# Patient Record
Sex: Male | Born: 1940
Health system: Southern US, Community
[De-identification: ages and names within clinical notes are randomized; demographics above are authoritative.]

## PROBLEM LIST (undated history)

## (undated) DIAGNOSIS — G629 Polyneuropathy, unspecified: Secondary | ICD-10-CM

## (undated) DIAGNOSIS — E119 Type 2 diabetes mellitus without complications: Secondary | ICD-10-CM

## (undated) DIAGNOSIS — C679 Malignant neoplasm of bladder, unspecified: Secondary | ICD-10-CM

## (undated) DIAGNOSIS — R2230 Localized swelling, mass and lump, unspecified upper limb: Secondary | ICD-10-CM

## (undated) DIAGNOSIS — N3090 Cystitis, unspecified without hematuria: Secondary | ICD-10-CM

## (undated) DIAGNOSIS — I499 Cardiac arrhythmia, unspecified: Secondary | ICD-10-CM

## (undated) DIAGNOSIS — C4490 Unspecified malignant neoplasm of skin, unspecified: Secondary | ICD-10-CM

## (undated) DIAGNOSIS — H9390 Unspecified disorder of ear, unspecified ear: Secondary | ICD-10-CM

## (undated) DIAGNOSIS — R369 Urethral discharge, unspecified: Secondary | ICD-10-CM

## (undated) DIAGNOSIS — F319 Bipolar disorder, unspecified: Secondary | ICD-10-CM

## (undated) DIAGNOSIS — F419 Anxiety disorder, unspecified: Secondary | ICD-10-CM

## (undated) DIAGNOSIS — R0789 Other chest pain: Secondary | ICD-10-CM

## (undated) DIAGNOSIS — N390 Urinary tract infection, site not specified: Secondary | ICD-10-CM

## (undated) DIAGNOSIS — N400 Enlarged prostate without lower urinary tract symptoms: Secondary | ICD-10-CM

## (undated) DIAGNOSIS — M79642 Pain in left hand: Secondary | ICD-10-CM

## (undated) DIAGNOSIS — E785 Hyperlipidemia, unspecified: Secondary | ICD-10-CM

## (undated) DIAGNOSIS — F32A Depression, unspecified: Secondary | ICD-10-CM

## (undated) DIAGNOSIS — K35891 Other acute appendicitis without perforation, with gangrene: Secondary | ICD-10-CM

## (undated) DIAGNOSIS — I10 Essential (primary) hypertension: Secondary | ICD-10-CM

## (undated) DIAGNOSIS — F329 Major depressive disorder, single episode, unspecified: Secondary | ICD-10-CM

## (undated) DIAGNOSIS — G4700 Insomnia, unspecified: Secondary | ICD-10-CM

## (undated) DIAGNOSIS — R300 Dysuria: Secondary | ICD-10-CM

## (undated) DIAGNOSIS — A6002 Herpesviral infection of other male genital organs: Secondary | ICD-10-CM

## (undated) DIAGNOSIS — Z87448 Personal history of other diseases of urinary system: Secondary | ICD-10-CM

## (undated) DIAGNOSIS — N200 Calculus of kidney: Secondary | ICD-10-CM

## (undated) HISTORY — PX: BLADDER SURGERY: SHX569

## (undated) HISTORY — PX: SKIN CANCER EXCISION: SHX779

## (undated) HISTORY — PX: CHOLECYSTECTOMY: SHX55

## (undated) HISTORY — DX: Urethral discharge, unspecified: R36.9

## (undated) HISTORY — PX: FRACTURE SURGERY: SHX138

## (undated) HISTORY — DX: Localized swelling, mass and lump, unspecified upper limb: R22.30

## (undated) HISTORY — PX: HERNIA REPAIR: SHX51

## (undated) HISTORY — DX: Dysuria: R30.0

## (undated) HISTORY — DX: Insomnia, unspecified: G47.00

## (undated) HISTORY — DX: Polyneuropathy, unspecified: G62.9

## (undated) HISTORY — DX: Bipolar disorder, unspecified: F31.9

## (undated) HISTORY — PX: COLOSTOMY: SHX63

## (undated) HISTORY — DX: Pain in left hand: M79.642

## (undated) HISTORY — DX: Unspecified disorder of ear, unspecified ear: H93.90

## (undated) HISTORY — DX: Essential (primary) hypertension: I10

## (undated) HISTORY — DX: Personal history of other diseases of urinary system: Z87.448

## (undated) HISTORY — DX: Other chest pain: R07.89

## (undated) HISTORY — DX: Type 2 diabetes mellitus without complications: E11.9

## (undated) HISTORY — DX: Benign prostatic hyperplasia without lower urinary tract symptoms: N40.0

## (undated) HISTORY — PX: INNER EAR SURGERY: SHX679

## (undated) HISTORY — DX: Hyperlipidemia, unspecified: E78.5

## (undated) HISTORY — PX: APPENDECTOMY: SHX54

---

## 2002-01-28 ENCOUNTER — Emergency Department (HOSPITAL_COMMUNITY): Admission: EM | Admit: 2002-01-28 | Discharge: 2002-01-28 | Payer: Self-pay | Admitting: Emergency Medicine

## 2002-02-23 ENCOUNTER — Emergency Department (HOSPITAL_COMMUNITY): Admission: EM | Admit: 2002-02-23 | Discharge: 2002-02-23 | Payer: Self-pay | Admitting: Emergency Medicine

## 2002-02-23 ENCOUNTER — Encounter: Payer: Self-pay | Admitting: Emergency Medicine

## 2002-10-21 ENCOUNTER — Emergency Department (HOSPITAL_COMMUNITY): Admission: EM | Admit: 2002-10-21 | Discharge: 2002-10-21 | Payer: Self-pay

## 2003-05-20 ENCOUNTER — Emergency Department (HOSPITAL_COMMUNITY): Admission: EM | Admit: 2003-05-20 | Discharge: 2003-05-20 | Payer: Self-pay | Admitting: Emergency Medicine

## 2003-08-11 ENCOUNTER — Emergency Department (HOSPITAL_COMMUNITY): Admission: EM | Admit: 2003-08-11 | Discharge: 2003-08-11 | Payer: Self-pay | Admitting: Emergency Medicine

## 2003-12-26 ENCOUNTER — Other Ambulatory Visit: Payer: Self-pay

## 2004-05-10 ENCOUNTER — Inpatient Hospital Stay: Payer: Self-pay | Admitting: Urology

## 2004-05-17 ENCOUNTER — Ambulatory Visit: Payer: Self-pay | Admitting: Oncology

## 2004-06-15 ENCOUNTER — Ambulatory Visit: Payer: Self-pay | Admitting: Urology

## 2004-07-26 ENCOUNTER — Emergency Department: Payer: Self-pay | Admitting: Emergency Medicine

## 2004-08-02 ENCOUNTER — Emergency Department: Payer: Self-pay | Admitting: Emergency Medicine

## 2004-08-17 ENCOUNTER — Emergency Department: Payer: Self-pay | Admitting: General Practice

## 2004-11-03 ENCOUNTER — Emergency Department: Payer: Self-pay | Admitting: Emergency Medicine

## 2004-11-18 ENCOUNTER — Emergency Department: Payer: Self-pay | Admitting: Emergency Medicine

## 2004-12-14 ENCOUNTER — Ambulatory Visit: Payer: Self-pay | Admitting: Oncology

## 2005-03-30 ENCOUNTER — Ambulatory Visit: Payer: Self-pay | Admitting: Urology

## 2005-05-04 ENCOUNTER — Emergency Department: Payer: Self-pay | Admitting: Unknown Physician Specialty

## 2005-05-30 ENCOUNTER — Ambulatory Visit: Payer: Self-pay | Admitting: Nurse Practitioner

## 2005-06-05 ENCOUNTER — Ambulatory Visit: Payer: Self-pay | Admitting: Oncology

## 2005-06-14 ENCOUNTER — Ambulatory Visit: Payer: Self-pay | Admitting: Oncology

## 2005-06-26 ENCOUNTER — Ambulatory Visit: Payer: Self-pay | Admitting: Urology

## 2005-06-29 ENCOUNTER — Ambulatory Visit: Payer: Self-pay | Admitting: Urology

## 2005-07-14 ENCOUNTER — Ambulatory Visit: Payer: Self-pay | Admitting: Oncology

## 2005-07-22 ENCOUNTER — Emergency Department: Payer: Self-pay | Admitting: Emergency Medicine

## 2005-08-16 ENCOUNTER — Emergency Department: Payer: Self-pay | Admitting: Emergency Medicine

## 2005-10-21 ENCOUNTER — Emergency Department: Payer: Self-pay | Admitting: Emergency Medicine

## 2005-10-23 ENCOUNTER — Ambulatory Visit: Payer: Self-pay | Admitting: Urology

## 2005-10-25 ENCOUNTER — Ambulatory Visit: Payer: Self-pay | Admitting: Oncology

## 2005-12-19 ENCOUNTER — Inpatient Hospital Stay (HOSPITAL_COMMUNITY): Admission: RE | Admit: 2005-12-19 | Discharge: 2005-12-20 | Payer: Self-pay | Admitting: General Surgery

## 2006-02-11 ENCOUNTER — Emergency Department (HOSPITAL_COMMUNITY): Admission: EM | Admit: 2006-02-11 | Discharge: 2006-02-12 | Payer: Self-pay | Admitting: Emergency Medicine

## 2006-02-12 ENCOUNTER — Emergency Department: Payer: Self-pay | Admitting: Unknown Physician Specialty

## 2006-03-31 ENCOUNTER — Emergency Department: Payer: Self-pay | Admitting: Emergency Medicine

## 2006-05-24 ENCOUNTER — Ambulatory Visit: Payer: Self-pay | Admitting: Urology

## 2006-05-30 ENCOUNTER — Emergency Department: Payer: Self-pay

## 2006-06-12 ENCOUNTER — Other Ambulatory Visit: Payer: Self-pay

## 2006-06-12 ENCOUNTER — Ambulatory Visit: Payer: Self-pay | Admitting: Urology

## 2006-07-31 ENCOUNTER — Ambulatory Visit: Payer: Self-pay | Admitting: Urology

## 2006-08-11 ENCOUNTER — Emergency Department: Payer: Self-pay | Admitting: Emergency Medicine

## 2006-09-17 ENCOUNTER — Ambulatory Visit: Payer: Self-pay | Admitting: Urology

## 2007-01-09 ENCOUNTER — Ambulatory Visit: Payer: Self-pay | Admitting: Urology

## 2007-01-10 ENCOUNTER — Ambulatory Visit: Payer: Self-pay | Admitting: Urology

## 2007-03-21 ENCOUNTER — Emergency Department: Payer: Self-pay | Admitting: Emergency Medicine

## 2007-06-07 ENCOUNTER — Emergency Department: Payer: Self-pay | Admitting: Emergency Medicine

## 2007-08-28 ENCOUNTER — Emergency Department: Payer: Self-pay | Admitting: Emergency Medicine

## 2007-09-03 ENCOUNTER — Emergency Department: Payer: Self-pay | Admitting: Emergency Medicine

## 2007-09-11 ENCOUNTER — Ambulatory Visit: Payer: Self-pay | Admitting: Urology

## 2008-03-14 ENCOUNTER — Emergency Department: Payer: Self-pay | Admitting: Emergency Medicine

## 2008-07-29 ENCOUNTER — Ambulatory Visit: Payer: Self-pay | Admitting: Urology

## 2008-11-18 ENCOUNTER — Emergency Department: Payer: Self-pay | Admitting: Emergency Medicine

## 2009-06-12 ENCOUNTER — Emergency Department: Payer: Self-pay | Admitting: Emergency Medicine

## 2009-07-28 ENCOUNTER — Ambulatory Visit: Payer: Self-pay | Admitting: Urology

## 2010-07-27 ENCOUNTER — Ambulatory Visit: Payer: Self-pay | Admitting: Urology

## 2010-09-04 ENCOUNTER — Encounter: Payer: Self-pay | Admitting: Internal Medicine

## 2010-10-17 ENCOUNTER — Emergency Department: Payer: Self-pay | Admitting: Emergency Medicine

## 2011-11-30 ENCOUNTER — Emergency Department: Payer: Self-pay | Admitting: Emergency Medicine

## 2011-12-07 ENCOUNTER — Emergency Department: Payer: Self-pay | Admitting: Internal Medicine

## 2012-10-31 DIAGNOSIS — R300 Dysuria: Secondary | ICD-10-CM

## 2012-10-31 DIAGNOSIS — A6004 Herpesviral vulvovaginitis: Secondary | ICD-10-CM | POA: Insufficient documentation

## 2012-10-31 DIAGNOSIS — R369 Urethral discharge, unspecified: Secondary | ICD-10-CM

## 2012-10-31 HISTORY — DX: Urethral discharge, unspecified: R36.9

## 2012-10-31 HISTORY — DX: Dysuria: R30.0

## 2012-11-07 DIAGNOSIS — A6001 Herpesviral infection of penis: Secondary | ICD-10-CM | POA: Insufficient documentation

## 2012-11-07 DIAGNOSIS — R339 Retention of urine, unspecified: Secondary | ICD-10-CM | POA: Insufficient documentation

## 2012-11-07 DIAGNOSIS — K432 Incisional hernia without obstruction or gangrene: Secondary | ICD-10-CM | POA: Insufficient documentation

## 2012-11-28 ENCOUNTER — Ambulatory Visit: Payer: Self-pay | Admitting: Surgery

## 2012-11-28 LAB — BASIC METABOLIC PANEL
Anion Gap: 7 (ref 7–16)
BUN: 20 mg/dL — ABNORMAL HIGH (ref 7–18)
Calcium, Total: 8.5 mg/dL (ref 8.5–10.1)
Chloride: 111 mmol/L — ABNORMAL HIGH (ref 98–107)
Co2: 20 mmol/L — ABNORMAL LOW (ref 21–32)
Creatinine: 0.94 mg/dL (ref 0.60–1.30)
EGFR (African American): >60
EGFR (Non-African Amer.): >60
Glucose: 125 mg/dL — ABNORMAL HIGH (ref 65–99)
Osmolality: 280 (ref 275–301)
Potassium: 4.1 mmol/L (ref 3.5–5.1)
Sodium: 138 mmol/L (ref 136–145)

## 2012-11-28 LAB — CBC
HCT: 46.7 % (ref 40.0–52.0)
HGB: 15.7 g/dL (ref 13.0–18.0)
MCH: 29 pg (ref 26.0–34.0)
MCHC: 33.7 g/dL (ref 32.0–36.0)
MCV: 86 fL (ref 80–100)
Platelet: 245 10*3/uL (ref 150–440)
RBC: 5.42 10*6/uL (ref 4.40–5.90)
RDW: 13.8 % (ref 11.5–14.5)
WBC: 8.2 10*3/uL (ref 3.8–10.6)

## 2012-12-05 ENCOUNTER — Ambulatory Visit: Payer: Self-pay | Admitting: Surgery

## 2013-01-29 ENCOUNTER — Ambulatory Visit: Payer: Self-pay | Admitting: Surgery

## 2013-02-06 ENCOUNTER — Ambulatory Visit: Payer: Self-pay | Admitting: Surgery

## 2013-03-27 DIAGNOSIS — Z87448 Personal history of other diseases of urinary system: Secondary | ICD-10-CM | POA: Insufficient documentation

## 2013-03-27 HISTORY — DX: Personal history of other diseases of urinary system: Z87.448

## 2013-06-12 DIAGNOSIS — N411 Chronic prostatitis: Secondary | ICD-10-CM | POA: Insufficient documentation

## 2013-11-04 ENCOUNTER — Ambulatory Visit: Payer: Self-pay

## 2014-05-18 DIAGNOSIS — F411 Generalized anxiety disorder: Secondary | ICD-10-CM | POA: Insufficient documentation

## 2014-05-20 ENCOUNTER — Emergency Department: Payer: Self-pay | Admitting: Emergency Medicine

## 2014-08-21 DIAGNOSIS — E119 Type 2 diabetes mellitus without complications: Secondary | ICD-10-CM | POA: Diagnosis not present

## 2014-08-21 DIAGNOSIS — E785 Hyperlipidemia, unspecified: Secondary | ICD-10-CM | POA: Diagnosis not present

## 2014-08-21 DIAGNOSIS — F418 Other specified anxiety disorders: Secondary | ICD-10-CM | POA: Diagnosis not present

## 2014-08-21 DIAGNOSIS — I10 Essential (primary) hypertension: Secondary | ICD-10-CM | POA: Diagnosis not present

## 2014-08-21 DIAGNOSIS — Z7189 Other specified counseling: Secondary | ICD-10-CM | POA: Diagnosis not present

## 2014-08-25 DIAGNOSIS — E119 Type 2 diabetes mellitus without complications: Secondary | ICD-10-CM | POA: Diagnosis not present

## 2014-09-21 DIAGNOSIS — E1142 Type 2 diabetes mellitus with diabetic polyneuropathy: Secondary | ICD-10-CM | POA: Diagnosis not present

## 2014-09-21 DIAGNOSIS — M546 Pain in thoracic spine: Secondary | ICD-10-CM | POA: Diagnosis not present

## 2014-09-21 DIAGNOSIS — E785 Hyperlipidemia, unspecified: Secondary | ICD-10-CM | POA: Diagnosis not present

## 2014-09-21 DIAGNOSIS — E1169 Type 2 diabetes mellitus with other specified complication: Secondary | ICD-10-CM | POA: Diagnosis not present

## 2014-09-21 DIAGNOSIS — G629 Polyneuropathy, unspecified: Secondary | ICD-10-CM | POA: Diagnosis not present

## 2014-12-04 NOTE — Op Note (Signed)
PATIENT NAME:  Rick [name], Rick [name] MR#:  [id number] DATE OF BIRTH:  25-Oct-1940  DATE OF PROCEDURE:  12/05/2012  PREOPERATIVE DIAGNOSIS: Recurrent ventral hernia.   POSTOPERATIVE DIAGNOSIS: Recurrent ventral hernia.   PROCEDURE: Repair of recurrent ventral hernia.   SURGEON: Rochel Brome, M.D.   ANESTHESIA: General.   INDICATIONS: This 74 year old male has had a history of multiple abdominal operations. Has had previous hernia repair in the left mid-abdomen. Also has had a long mid-abdominal incision. Recently presented with pain and swelling in the left mid-abdomen. A ventral hernia was demonstrated on physical exam just adjacent to an old transverse scar. Surgery was recommended for definitive treatment.   DESCRIPTION OF PROCEDURE: The patient was placed on the operating table in the supine position under general endotracheal anesthesia. The abdomen was prepared with clippers and ChloraPrep, draped in a sterile manner.   A transversely-oriented incision was made approximately 8 cm in length and carried down through subcutaneous tissues. This incision was at the old scar, carried down through subcutaneous tissues, and I encountered a ventral hernia sac. The sac was dissected free from surrounding structures. I did identify mesh which was located medially, and the fascial defect. The fascial defect itself was approximately 4 cm in dimension. The peritoneum was dissected away from the fascial ring defect. A portion of the peritoneum was excised. The fascia was dissected circumferentially, and there was a finding of another defect just about 3 cm below the fascial edge inferiorly, and the peritoneum was separated from the fascia at that point, and that smaller hernia was closed with a 0 Surgilon figure-of-eight suture.   Next, an Atrium mesh was cut to create a circular shape of some 5 cm in diameter and placed into the properitoneal plane. It was sutured to the fascia inferior to the smaller defect and  also was sutured to the fascia circumferentially with mattress-type sutures, and also through-and-through sutures. Subsequently the fascial edges were closed over the mesh, incorporating mesh into the repair. Hemostasis appeared to be intact. It is noted that during the course of the procedure numerous small bleeding points were cauterized. The subcutaneous tissues were approximated with interrupted 3-0 chromic. The skin was closed with running 4-0 Monocryl subcuticular suture and Dermabond.   It was further noted at this point that the portion of the peritoneum was closed with running  3-0 chromic prior to the repair.   The patient tolerated the procedure satisfactorily and was then carried to the recovery room for postoperative care.     ____________________________ J. Rochel Brome, MD jws:dm D: 12/05/2012 10:56:09 ET T: 12/05/2012 11:09:47 ET JOB#: [id number]  cc: Loreli Dollar, MD, <Dictator> Loreli Dollar MD ELECTRONICALLY SIGNED 12/05/2012 17:26

## 2014-12-04 NOTE — Op Note (Signed)
PATIENT NAME:  Rick [name], Rick [name] MR#:  [id number] DATE OF BIRTH:  Sep 04, 1940  DATE OF PROCEDURE:  02/06/2013  PREOPERATIVE DIAGNOSIS: Ventral hernia, ventral hernia.   POSTOPERATIVE DIAGNOSIS: Recurrent ventral hernia, ventral hernia.   PROCEDURE: Repair of 2 ventral hernias.   SURGEON: Loreli Dollar, MD   ANESTHESIA: General.   INDICATIONS: This 74 year old male has had previous laparotomy at another facility which was a midline incision and was a dirty case. Also has had open cholecystectomy many years ago. Has had multiple hernias repaired in the past, including use of mesh. Recently has developed a bulge in the subxiphoid position and also in the hypogastric area of the abdomen just slightly to the right of the midline. Both were prominent bulges and appeared to be increasing in size and causing moderate discomfort, and repair recommended for definitive treatment.   DESCRIPTION OF PROCEDURE: The patient was placed on the operating table in the supine position under general endotracheal anesthesia. The abdomen was clipped and prepared with ChloraPrep and draped in a sterile manner.   The first hernia was repaired in the subxiphoid position, which was at the level of the medial extent of his right subcostal scar. A transverse incision was made some 4 cm in length and carried down through subcutaneous tissues to encounter a ventral hernia sac which was dissected free from surrounding structures. It did have a smooth plane of dissection and was dissected down through the fascial defect, which the fascial defect was somewhat oriented transversely. The defect was approximately 2 cm in dimension. The hernia sac was dissected free from the fascial defect and inverted. Next, properitoneal fat was dissected away from the fascia above and below the defect. A finger was inserted and palpated circumferentially. There was no other defect in the immediate area. Repair was carried out using properitoneal Atrium  mesh, which was cut to create an oval shape of approximately 1.5 x 2.5 cm, which was placed oriented transversely in the properitoneal plane. It was sutured to the deep fascia superiorly and inferiorly with through-and-through 0 Surgilon. Next, the repair was carried out with a transversely oriented suture line of interrupted 0 Surgilon figure-of-eight sutures, incorporating mesh into each suture. It was noted during the course of the procedure a number of small bleeding points were cauterized, and at the end of the procedure, hemostasis was intact. The Scarpa's fascia was closed with interrupted 4-0 chromic. The skin was closed with running 4-0 Monocryl subcuticular suture.   Next, attention was turned to the hypogastric hernia, which is somewhat larger. A transversely oriented 6 cm incision was made, which was mostly to the right of the midline, but it did extend approximately a centimeter to the left of the midline. This was carried down through subcutaneous tissues and encountered what appeared to be scar tissue. There was an old piece of Prolene suture which was removed and dissected down to encounter a fascial defect. There was old mesh found in the superior aspect of the defect, and it appeared that this was a recurrent hernia, which hernia was just immediately beneath the previously placed mesh. It was further noted there was a longer incision in the left lower quadrant from a previous repair which extended up to the midline. Next, a somewhat tedious dissection was undertaken as there was a lot of scar tissue, and the hernia sac was dissected away from the fascia and away from the old piece of mesh circumferentially. The resulting defect was approximately 4 x 2 cm in  dimension. An Atrium mesh was selected and cut to create an oval shape of approximately 5 x 3 cm. This was placed into the properitoneal plane and was sutured with through-and-through 0 Surgilon sutures, placing one on each end and two across  the lower side and two across the upper side, which the upper side were attached to the old mesh. Next, remaining fascia was closed over the mesh with a transversely oriented suture line of interrupted 0 Surgilon figure-of-eight sutures, and this did bury the old piece of mesh beneath the fascia. It was noted during the course of the procedure, a number of small bleeding points were cauterized. Next, 0.5% Sensorcaine with epinephrine was infiltrated circumferentially around the repair and to the deep fascia, and also the subcutaneous tissues were infiltrated. The Scarpa's fascia was closed with interrupted 4-0 chromic, and the skin was closed with running 4-0 Monocryl subcuticular suture.   Both wounds were then cleaned and dried and treated with Dermabond. The Dermabond was allowed to dry, and subsequently, the patient was prepared for transfer to the recovery room.  ____________________________ Lenna Sciara. Rochel Brome, MD jws:OSi D: 02/06/2013 11:25:45 ET T: 02/06/2013 11:35:11 ET JOB#: [id number]  cc: Loreli Dollar, MD, <Dictator> Loreli Dollar MD ELECTRONICALLY SIGNED 02/07/2013 19:43

## 2014-12-21 DIAGNOSIS — I10 Essential (primary) hypertension: Secondary | ICD-10-CM | POA: Diagnosis not present

## 2014-12-21 DIAGNOSIS — E785 Hyperlipidemia, unspecified: Secondary | ICD-10-CM | POA: Diagnosis not present

## 2014-12-21 DIAGNOSIS — E1142 Type 2 diabetes mellitus with diabetic polyneuropathy: Secondary | ICD-10-CM | POA: Diagnosis not present

## 2014-12-21 DIAGNOSIS — E1169 Type 2 diabetes mellitus with other specified complication: Secondary | ICD-10-CM | POA: Diagnosis not present

## 2015-01-29 ENCOUNTER — Telehealth: Payer: Self-pay | Admitting: Family Medicine

## 2015-02-10 ENCOUNTER — Ambulatory Visit (INDEPENDENT_AMBULATORY_CARE_PROVIDER_SITE_OTHER): Payer: Medicare Other | Admitting: Family Medicine

## 2015-02-10 ENCOUNTER — Encounter: Payer: Self-pay | Admitting: Family Medicine

## 2015-02-10 VITALS — BP 122/70 | HR 84 | Temp 97.4°F | Ht 68.5 in | Wt 179.9 lb

## 2015-02-10 DIAGNOSIS — E118 Type 2 diabetes mellitus with unspecified complications: Secondary | ICD-10-CM

## 2015-02-10 DIAGNOSIS — Z8719 Personal history of other diseases of the digestive system: Secondary | ICD-10-CM | POA: Insufficient documentation

## 2015-02-10 DIAGNOSIS — F329 Major depressive disorder, single episode, unspecified: Secondary | ICD-10-CM | POA: Insufficient documentation

## 2015-02-10 DIAGNOSIS — F32A Depression, unspecified: Secondary | ICD-10-CM | POA: Insufficient documentation

## 2015-02-10 DIAGNOSIS — R2230 Localized swelling, mass and lump, unspecified upper limb: Secondary | ICD-10-CM | POA: Insufficient documentation

## 2015-02-10 DIAGNOSIS — F419 Anxiety disorder, unspecified: Secondary | ICD-10-CM

## 2015-02-10 DIAGNOSIS — R2232 Localized swelling, mass and lump, left upper limb: Secondary | ICD-10-CM | POA: Diagnosis not present

## 2015-02-10 HISTORY — DX: Localized swelling, mass and lump, unspecified upper limb: R22.30

## 2015-02-10 NOTE — Progress Notes (Signed)
Name: Rick [name]   MRN: [id number]    DOB: 31-Mar-1941   Date:02/10/2015       Progress Note  Subjective  Chief Complaint  Chief Complaint  Patient presents with  . Follow-up    diabetes    Diabetes He presents for his follow-up diabetic visit. He has type 2 diabetes mellitus. His disease course has been stable. Current diabetic treatment includes oral agent (dual therapy). An ACE inhibitor/angiotensin II receptor blocker is being taken.   Pt. Has cystic mass on his right arm, present for over 2 years and pt. thinks it is slowly enlarging but is not tender. Pt. is also concerned about a 'hernia' in his abdomen. He has history of appendicitis and had multiple abdominal surgeries, including some for abdominal wall hernias with insertion of mesh. He denies any abdominal pain but wants to be checked for any additional hernias.   Past Medical History  Diagnosis Date  . Diabetes mellitus without complication   . Hyperlipidemia   . Hypertension   . BPH (benign prostatic hypertrophy)   . Insomnia   . Bipolar disorder   . Peripheral neuropathy     Past Surgical History  Procedure Laterality Date  . Hernia repair    . Appendectomy    . Cholecystectomy      Family History  Problem Relation Age of Onset  . Transient ischemic attack Mother   . Diabetes Father   . Cancer Brother     History   Social History  . Marital Status: Divorced    Spouse Name: N/A  . Number of Children: N/A  . Years of Education: N/A   Occupational History  . Not on file.   Social History Main Topics  . Smoking status: Former Research scientist (life sciences)  . Smokeless tobacco: Not on file  . Alcohol Use: No  . Drug Use: No  . Sexual Activity: Not on file   Other Topics Concern  . Not on file   Social History Narrative  . No narrative on file     Current outpatient prescriptions:  .  carvedilol (COREG) 6.25 MG tablet, Take by mouth., Disp: , Rfl:  .  finasteride (PROSCAR) 5 MG tablet, Take 5 mg by mouth.,  Disp: , Rfl:  .  gabapentin (NEURONTIN) 300 MG capsule, TAKE ONE CAPSULE BY MOUTH AT BEDTIME THEN TWICE DAILY FOR NERVE PAIN, Disp: , Rfl:  .  glipiZIDE (GLUCOTROL) 10 MG tablet, Take 1 tablet by mouth 2 (two) times daily., Disp: , Rfl:  .  glucose blood (ONE TOUCH ULTRA TEST) test strip, CHECK BLOOD SUGAR TWICE DAILY AS DIRECTED., Disp: , Rfl:  .  hydrochlorothiazide (HYDRODIURIL) 25 MG tablet, One a day prn for elevated BP or swelling., Disp: , Rfl:  .  lamoTRIgine (LAMICTAL) 100 MG tablet, Take by mouth., Disp: , Rfl:  .  Lancets (ONETOUCH ULTRASOFT) lancets, CHECK BLOOD SUGAR TWICE DAILY AS DIRECTED., Disp: , Rfl:  .  lovastatin (MEVACOR) 20 MG tablet, Take 2 tablets by mouth daily., Disp: , Rfl:  .  metFORMIN (GLUCOPHAGE) 500 MG tablet, TAKE ONE (1) TABLET EACH DAY FOR DIABETES, Disp: , Rfl:  .  mirtazapine (REMERON) 15 MG tablet, TAKE ONE TABLET AT BEDTIME, Disp: , Rfl:  .  traZODone (DESYREL) 100 MG tablet, TAKE ONE TABLET AT BEDTIME, Disp: , Rfl:  .  diclofenac sodium (VOLTAREN) 1 % GEL, Apply topically., Disp: , Rfl:  .  enalapril (VASOTEC) 10 MG tablet, Take by mouth., Disp: , Rfl:  Allergies  Allergen Reactions  . Haloperidol     Other reaction(s): Unknown  . Sulfa Antibiotics     Other reaction(s): OTHER     Review of Systems  Gastrointestinal: Negative for nausea, vomiting, abdominal pain, constipation and blood in stool.  Skin: Negative for itching and rash.      Objective  Filed Vitals:   02/10/15 1030  BP: 122/70  Pulse: 84  Temp: 97.4 F (36.3 C)  TempSrc: Oral  Height: 5' 8.5" (1.74 m)  Weight: 179 lb 14.4 oz (81.602 kg)  SpO2: 98%    Physical Exam  Constitutional: He is well-developed, well-nourished, and in no distress.  Abdominal: Soft. Bowel sounds are normal. There is no tenderness. No hernia.    Multiple surgical scars. Abdominal protrusion, but non tender.  Skin:     Soft, mobile, nontender mass on left distal arm  Nursing note and  vitals reviewed.      No results found for this or any previous visit (from the past 2160 hour(s)).   Assessment & Plan 1. Mass of arm, left Referral to Gen. surgery for follow-up on left arm mass. - Ambulatory referral to General Surgery  2. History of abdominal hernia Pt. has had multiple abdominal surgeries including appendectomy, cholecystectomy, hernia repair, and laparotomy. Referral to Gen. surgery for evaluation of any possible hernias - Ambulatory referral to General Surgery  3. Diabetes mellitus type 2, controlled, with complications Last W0J is at goal. He is to continue on metformin and glipizide and repeat A1c in September 2016.   Anyeli Hockenbury [facility] 02/10/2015 12:12 PM

## 2015-02-17 ENCOUNTER — Telehealth: Payer: Self-pay | Admitting: Family Medicine

## 2015-02-17 NOTE — Telephone Encounter (Signed)
Please return patients call. He states he needs to see a Psychologist, sport and exercise for his dermatologist issue. Please call him back.

## 2015-02-18 NOTE — Telephone Encounter (Signed)
I just want to verify patient os checking on referral what is the latest update and is there anything i need to do to help?

## 2015-02-19 NOTE — Telephone Encounter (Signed)
Tried to contact patient on the phone number listed, but that has been disconnected. I can speak with the patient to discuss the dermatology referral when he contacts our office.

## 2015-02-19 NOTE — Telephone Encounter (Signed)
ERRENOUS °

## 2015-02-19 NOTE — Telephone Encounter (Signed)
Pt now wants a referral to see a dermatologist. I did inform patient that we were still working on his general surgery referral but that he may have to go to [place] and said that would be fine but he wanted to get the dermatology referral set up first.

## 2015-02-23 NOTE — Telephone Encounter (Signed)
Patient returned call for Dr. Manuella Ghazi concerning dermatology referral, he said he would like to be referred somewhere in [place] and that he can be reached at [phone], please return call to him concerning this referral.

## 2015-02-23 NOTE — Telephone Encounter (Signed)
Called patient at the number provided but no one answered. Please schedule patient for an appointment to discuss the dermatology referral.

## 2015-03-22 ENCOUNTER — Telehealth: Payer: Self-pay | Admitting: Family Medicine

## 2015-03-22 NOTE — Telephone Encounter (Signed)
Attempted to call patient on 03/22/15 @ 10:18am to inform patient that his insurance rep. Called our office on today and they were given our insurance credentialing department contact info.  There was no answer when I called Rick [name] and no voicemail box was available.

## 2015-03-23 ENCOUNTER — Telehealth: Payer: Self-pay | Admitting: Family Medicine

## 2015-03-23 ENCOUNTER — Ambulatory Visit: Payer: Self-pay | Admitting: Family Medicine

## 2015-03-23 MED ORDER — LOVASTATIN 20 MG PO TABS: 40.0000 mg | ORAL_TABLET | Freq: Every day | ORAL | Status: DC

## 2015-03-23 NOTE — Telephone Encounter (Signed)
Medication has been refilled and sent to [facility] 

## 2015-03-24 ENCOUNTER — Encounter: Payer: Self-pay | Admitting: Family Medicine

## 2015-03-24 ENCOUNTER — Ambulatory Visit (INDEPENDENT_AMBULATORY_CARE_PROVIDER_SITE_OTHER): Payer: Medicare Other | Admitting: Family Medicine

## 2015-03-24 ENCOUNTER — Other Ambulatory Visit: Payer: Self-pay

## 2015-03-24 VITALS — BP 134/78 | HR 88 | Resp 18 | Ht 69.0 in | Wt 184.6 lb

## 2015-03-24 DIAGNOSIS — F319 Bipolar disorder, unspecified: Secondary | ICD-10-CM | POA: Insufficient documentation

## 2015-03-24 DIAGNOSIS — H9391 Unspecified disorder of right ear: Secondary | ICD-10-CM

## 2015-03-24 DIAGNOSIS — H9390 Unspecified disorder of ear, unspecified ear: Secondary | ICD-10-CM

## 2015-03-24 DIAGNOSIS — G4700 Insomnia, unspecified: Secondary | ICD-10-CM | POA: Insufficient documentation

## 2015-03-24 DIAGNOSIS — E119 Type 2 diabetes mellitus without complications: Secondary | ICD-10-CM | POA: Insufficient documentation

## 2015-03-24 DIAGNOSIS — E1142 Type 2 diabetes mellitus with diabetic polyneuropathy: Secondary | ICD-10-CM | POA: Insufficient documentation

## 2015-03-24 HISTORY — DX: Unspecified disorder of ear, unspecified ear: H93.90

## 2015-03-24 NOTE — Progress Notes (Signed)
Name: Rick [name]   MRN: [id number]    DOB: [birth year]   Date:03/24/2015       Progress Note  Subjective  Chief Complaint  Chief Complaint  Patient presents with  . Ear Problem    HPI Pt. Is here to have his right ear checked. He thinks that there may be wax build up in his right ear. He has no discharge from the ear, no fevers, or chills. He had ear wax removed by previous PCP. He ' knocked a hole in the eardrum' 2-3 years ago and has decreased hearing as a result. He was seen by Dr. Richardson Landry at that time.  Past Medical History  Diagnosis Date  . Diabetes mellitus without complication   . Hyperlipidemia   . Hypertension   . BPH (benign prostatic hypertrophy)   . Insomnia   . Bipolar disorder   . Peripheral neuropathy     Past Surgical History  Procedure Laterality Date  . Hernia repair    . Appendectomy    . Cholecystectomy      Family History  Problem Relation Age of Onset  . Transient ischemic attack Mother   . Diabetes Father   . Cancer Brother     Social History   Social History  . Marital Status: Divorced    Spouse Name: N/A  . Number of Children: N/A  . Years of Education: N/A   Occupational History  . Not on file.   Social History Main Topics  . Smoking status: Former Smoker -- 10 years    Types: Cigarettes    Start date: 08/14/1994    Quit date: 03/23/2005  . Smokeless tobacco: Former Systems developer    Types: Snuff, Chew    Quit date: 03/23/2010  . Alcohol Use: No  . Drug Use: No  . Sexual Activity: No   Other Topics Concern  . Not on file   Social History Narrative     Current outpatient prescriptions:  .  carvedilol (COREG) 6.25 MG tablet, Take by mouth., Disp: , Rfl:  .  diclofenac sodium (VOLTAREN) 1 % GEL, Apply topically., Disp: , Rfl:  .  enalapril (VASOTEC) 10 MG tablet, Take by mouth., Disp: , Rfl:  .  finasteride (PROSCAR) 5 MG tablet, Take 5 mg by mouth., Disp: , Rfl:  .  gabapentin (NEURONTIN) 300 MG capsule, TAKE ONE CAPSULE BY  MOUTH AT BEDTIME THEN TWICE DAILY FOR NERVE PAIN, Disp: , Rfl:  .  glipiZIDE (GLUCOTROL) 10 MG tablet, Take 1 tablet by mouth 2 (two) times daily., Disp: , Rfl:  .  glucose blood (ONE TOUCH ULTRA TEST) test strip, CHECK BLOOD SUGAR TWICE DAILY AS DIRECTED., Disp: , Rfl:  .  hydrochlorothiazide (HYDRODIURIL) 25 MG tablet, One a day prn for elevated BP or swelling., Disp: , Rfl:  .  lamoTRIgine (LAMICTAL) 100 MG tablet, Take by mouth., Disp: , Rfl:  .  Lancets (ONETOUCH ULTRASOFT) lancets, CHECK BLOOD SUGAR TWICE DAILY AS DIRECTED., Disp: , Rfl:  .  lovastatin (MEVACOR) 20 MG tablet, Take 2 tablets (40 mg total) by mouth daily., Disp: 30 tablet, Rfl: 1 .  metFORMIN (GLUCOPHAGE) 500 MG tablet, TAKE ONE (1) TABLET EACH DAY FOR DIABETES, Disp: , Rfl:  .  mirtazapine (REMERON) 15 MG tablet, TAKE ONE TABLET AT BEDTIME, Disp: , Rfl:  .  traZODone (DESYREL) 100 MG tablet, TAKE ONE TABLET AT BEDTIME, Disp: , Rfl:   Allergies  Allergen Reactions  . Haloperidol     Other reaction(s): Unknown  .  Sulfa Antibiotics     Other reaction(s): OTHER     Review of Systems  Constitutional: Negative for fever and chills.  HENT: Negative for ear discharge, ear pain, hearing loss and sore throat.   Neurological: Negative for headaches.      Objective  Filed Vitals:   03/24/15 1126  BP: 134/78  Pulse: 88  Resp: 18  Height: 5\' 9"  (1.753 m)  Weight: 184 lb 9.6 oz (83.734 kg)  SpO2: 98%    Physical Exam  HENT:  Right Ear: Tympanic membrane, external ear and ear canal normal. No drainage, swelling or tenderness. No decreased hearing is noted.  Mouth/Throat: No posterior oropharyngeal edema.  TM normal, no effusion visualized. Canal is normal.small quantity of cerumen visualized  Neck: Neck supple.  Cardiovascular: Normal rate and regular rhythm.   Pulmonary/Chest: Effort normal and breath sounds normal.  Nursing note and vitals reviewed.    Assessment & Plan  1. Ear problem, right  Patient  has no apparent abnormality of the right ear upon visual  Inspection using an otoscope. I have offered to refer him to ENT for further evaluation but patient appears satisfied and reassured. He will contact me if he has any concerning symptoms.  Cathryne Mancebo [facility] 03/24/2015 6:59 PM

## 2015-04-21 ENCOUNTER — Ambulatory Visit: Payer: Self-pay | Admitting: Family Medicine

## 2015-04-26 ENCOUNTER — Ambulatory Visit: Payer: Medicare Other | Admitting: Family Medicine

## 2015-04-27 ENCOUNTER — Ambulatory Visit: Payer: Medicare Other | Admitting: Family Medicine

## 2015-05-07 ENCOUNTER — Telehealth: Payer: Self-pay | Admitting: Family Medicine

## 2015-05-07 MED ORDER — LOVASTATIN 20 MG PO TABS: 40.0000 mg | ORAL_TABLET | Freq: Every day | ORAL | Status: DC

## 2015-05-07 NOTE — Telephone Encounter (Signed)
Medication has been refilled and sent to [facility]

## 2015-05-11 ENCOUNTER — Other Ambulatory Visit: Payer: Self-pay | Admitting: Family Medicine

## 2015-05-11 ENCOUNTER — Telehealth: Payer: Self-pay | Admitting: Family Medicine

## 2015-05-11 ENCOUNTER — Ambulatory Visit (INDEPENDENT_AMBULATORY_CARE_PROVIDER_SITE_OTHER): Payer: Medicare Other | Admitting: Family Medicine

## 2015-05-11 ENCOUNTER — Encounter: Payer: Self-pay | Admitting: Family Medicine

## 2015-05-11 VITALS — BP 135/77 | HR 89 | Temp 98.1°F | Resp 18 | Ht 69.0 in | Wt 188.7 lb

## 2015-05-11 DIAGNOSIS — E785 Hyperlipidemia, unspecified: Secondary | ICD-10-CM

## 2015-05-11 DIAGNOSIS — Z23 Encounter for immunization: Secondary | ICD-10-CM | POA: Diagnosis not present

## 2015-05-11 DIAGNOSIS — E1142 Type 2 diabetes mellitus with diabetic polyneuropathy: Secondary | ICD-10-CM

## 2015-05-11 DIAGNOSIS — E1140 Type 2 diabetes mellitus with diabetic neuropathy, unspecified: Secondary | ICD-10-CM

## 2015-05-11 DIAGNOSIS — I10 Essential (primary) hypertension: Secondary | ICD-10-CM | POA: Diagnosis not present

## 2015-05-11 LAB — POCT GLYCOSYLATED HEMOGLOBIN (HGB A1C): Hemoglobin A1C: 5.7

## 2015-05-11 NOTE — Progress Notes (Signed)
Name: Rick [name]   MRN: [id number]    DOB: 26-Apr-1941   Date:05/11/2015       Progress Note  Subjective  Chief Complaint  Chief Complaint  Patient presents with  . Follow-up    1 mo  . Medication Refill  . Diabetes    Diabetes He presents for his follow-up diabetic visit. He has type 2 diabetes mellitus. His disease course has been stable. Pertinent negatives for hypoglycemia include no headaches. Pertinent negatives for diabetes include no blurred vision and no chest pain. Pertinent negatives for diabetic complications include no CVA. Current diabetic treatment includes oral agent (dual therapy). He is following a diabetic diet. He participates in exercise daily. His breakfast blood glucose range is generally 90-110 mg/dl. An ACE inhibitor/angiotensin II receptor blocker is being taken. Eye exam is not current.  Hyperlipidemia This is a chronic problem. The problem is controlled. Recent lipid tests were reviewed and are normal. Exacerbating diseases include diabetes. Pertinent negatives include no chest pain, leg pain, myalgias or shortness of breath. Current antihyperlipidemic treatment includes statins. Risk factors for coronary artery disease include dyslipidemia and diabetes mellitus.  Hypertension This is a chronic problem. The problem is controlled. Pertinent negatives include no blurred vision, chest pain, headaches, palpitations or shortness of breath. Past treatments include beta blockers, diuretics and ACE inhibitors. Compliance problems include psychosocial issues (pt. claims taking BP medications 'as needed').  There is no history of kidney disease, CAD/MI or CVA.   This patient is poorly compliant with his medical, dietary  And pharmacotherapy. He reports taking medications 'as needed' such as 'my blood sugar is high.'He seems to have good insight into his chronic diseases but does not take medications as prescribed.  Past Medical History  Diagnosis Date  . Diabetes  mellitus without complication   . Hyperlipidemia   . Hypertension   . BPH (benign prostatic hypertrophy)   . Insomnia   . Bipolar disorder   . Peripheral neuropathy     Past Surgical History  Procedure Laterality Date  . Hernia repair    . Appendectomy    . Cholecystectomy      Family History  Problem Relation Age of Onset  . Transient ischemic attack Mother   . Diabetes Father   . Cancer Brother     Social History   Social History  . Marital Status: Divorced    Spouse Name: N/A  . Number of Children: N/A  . Years of Education: N/A   Occupational History  . Not on file.   Social History Main Topics  . Smoking status: Former Smoker -- 10 years    Types: Cigarettes    Start date: 08/14/1994    Quit date: 03/23/2005  . Smokeless tobacco: Former Systems developer    Types: Snuff, Chew    Quit date: 03/23/2010  . Alcohol Use: No  . Drug Use: No  . Sexual Activity: No   Other Topics Concern  . Not on file   Social History Narrative    Current outpatient prescriptions:  .  carvedilol (COREG) 6.25 MG tablet, Take by mouth., Disp: , Rfl:  .  diclofenac sodium (VOLTAREN) 1 % GEL, Apply topically., Disp: , Rfl:  .  enalapril (VASOTEC) 10 MG tablet, Take by mouth., Disp: , Rfl:  .  finasteride (PROSCAR) 5 MG tablet, Take 5 mg by mouth., Disp: , Rfl:  .  gabapentin (NEURONTIN) 300 MG capsule, TAKE ONE CAPSULE BY MOUTH AT BEDTIME THEN TWICE DAILY FOR NERVE PAIN, Disp: ,  Rfl:  .  glipiZIDE (GLUCOTROL) 10 MG tablet, Take 1 tablet by mouth 2 (two) times daily., Disp: , Rfl:  .  glucose blood (ONE TOUCH ULTRA TEST) test strip, CHECK BLOOD SUGAR TWICE DAILY AS DIRECTED., Disp: , Rfl:  .  hydrochlorothiazide (HYDRODIURIL) 25 MG tablet, One a day prn for elevated BP or swelling., Disp: , Rfl:  .  lamoTRIgine (LAMICTAL) 100 MG tablet, Take by mouth., Disp: , Rfl:  .  Lancets (ONETOUCH ULTRASOFT) lancets, CHECK BLOOD SUGAR TWICE DAILY AS DIRECTED., Disp: , Rfl:  .  lovastatin (MEVACOR) 20  MG tablet, Take 2 tablets (40 mg total) by mouth daily., Disp: 60 tablet, Rfl: 0 .  metFORMIN (GLUCOPHAGE) 500 MG tablet, TAKE ONE (1) TABLET EACH DAY FOR DIABETES, Disp: , Rfl:  .  mirtazapine (REMERON) 15 MG tablet, TAKE ONE TABLET AT BEDTIME, Disp: , Rfl:  .  mirtazapine (REMERON) 15 MG tablet, TAKE ONE TABLET AT BEDTIME, Disp: , Rfl:  .  traZODone (DESYREL) 100 MG tablet, TAKE ONE TABLET AT BEDTIME, Disp: , Rfl:   Allergies  Allergen Reactions  . Haloperidol     Other reaction(s): Unknown  . Sulfa Antibiotics     Other reaction(s): OTHER    Review of Systems  Eyes: Negative for blurred vision.  Respiratory: Negative for shortness of breath.   Cardiovascular: Negative for chest pain and palpitations.  Musculoskeletal: Negative for myalgias.  Neurological: Negative for headaches.    Objective  Filed Vitals:   05/11/15 1049  BP: 135/77  Pulse: 89  Temp: 98.1 F (36.7 C)  TempSrc: Oral  Resp: 18  Height: 5\' 9"  (1.753 m)  Weight: 188 lb 11.2 oz (85.594 kg)  SpO2: 92%    Physical Exam  Constitutional: He is well-developed, well-nourished, and in no distress.  Cardiovascular: Normal rate and regular rhythm.   Pulmonary/Chest: Effort normal and breath sounds normal.  Abdominal: Soft. Bowel sounds are normal.  Nursing note and vitals reviewed.   Assessment & Plan  1. Need for immunization against influenza  - Flu vaccine HIGH DOSE PF  2. Well controlled type 2 diabetes mellitus with peripheral neuropathy A1c is 5.7%, consistent with well-controlled diabetes mellitus. DC glipizide and continue on metformin 500 mg daily. Patient is insisting that metformin should be increased to twice daily. I have explained that current dose of metformin is appropriate based on his A1c and that we will recheck in 3 months. If his BG levels rise, he will schedule an appointment to discuss changes in pharmacotherapy. - POCT HgB A1C  3. Essential hypertension Blood pressure stable and  controlled on current therapy. Again, I do not know if he is taking the medication as prescribed. He will follow-up in 3 months.  4. Hyperlipidemia  - Lipid Profile - Comprehensive Metabolic Panel (CMET)   Syed Asad [facility] 05/11/2015 5:55 PM

## 2015-05-11 NOTE — Telephone Encounter (Signed)
Rick [name] from [facility] states patient is there: stating that a prescription for metformin 500mg  in the morning and 500mg  in the evening was suppose to have been called in. Please send thank you. Please return [facility] [phone]

## 2015-05-12 LAB — COMPREHENSIVE METABOLIC PANEL
ALT: 23 IU/L (ref 0–44)
AST: 21 IU/L (ref 0–40)
Albumin/Globulin Ratio: 2.2 (ref 1.1–2.5)
Albumin: 5 g/dL — ABNORMAL HIGH (ref 3.5–4.8)
Alkaline Phosphatase: 43 IU/L (ref 39–117)
BUN/Creatinine Ratio: 9 — ABNORMAL LOW (ref 10–22)
BUN: 9 mg/dL (ref 8–27)
Bilirubin Total: 0.5 mg/dL (ref 0.0–1.2)
CALCIUM: 9.8 mg/dL (ref 8.6–10.2)
CO2: 24 mmol/L (ref 18–29)
CREATININE: 1.04 mg/dL (ref 0.76–1.27)
Chloride: 106 mmol/L (ref 97–108)
GFR calc Af Amer: 82 mL/min/{1.73_m2} (ref >59)
GFR, EST NON AFRICAN AMERICAN: 71 mL/min/{1.73_m2} (ref >59)
GLOBULIN, TOTAL: 2.3 g/dL (ref 1.5–4.5)
Glucose: 70 mg/dL (ref 65–99)
Potassium: 5.1 mmol/L (ref 3.5–5.2)
Sodium: 145 mmol/L — ABNORMAL HIGH (ref 134–144)
TOTAL PROTEIN: 7.3 g/dL (ref 6.0–8.5)

## 2015-05-12 LAB — LIPID PANEL
CHOLESTEROL TOTAL: 160 mg/dL (ref 100–199)
Chol/HDL Ratio: 3.1 ratio units (ref 0.0–5.0)
HDL: 51 mg/dL (ref >39)
LDL Calculated: 79 mg/dL (ref 0–99)
Triglycerides: 148 mg/dL (ref 0–149)
VLDL Cholesterol Cal: 30 mg/dL (ref 5–40)

## 2015-05-12 NOTE — Telephone Encounter (Signed)
Called Anderson Malta from [facility] and confirmed prescription is for Metformin 500 mg once daily and we will recheck A1C in 3 months per Dr. Manuella Ghazi

## 2015-05-18 ENCOUNTER — Encounter: Payer: Self-pay | Admitting: Family Medicine

## 2015-05-18 ENCOUNTER — Ambulatory Visit (INDEPENDENT_AMBULATORY_CARE_PROVIDER_SITE_OTHER): Payer: Medicare Other | Admitting: Family Medicine

## 2015-05-18 VITALS — BP 138/74 | HR 130 | Temp 99.0°F | Resp 20 | Ht 69.0 in | Wt 185.0 lb

## 2015-05-18 DIAGNOSIS — M79642 Pain in left hand: Secondary | ICD-10-CM

## 2015-05-18 DIAGNOSIS — E1142 Type 2 diabetes mellitus with diabetic polyneuropathy: Secondary | ICD-10-CM | POA: Diagnosis not present

## 2015-05-18 DIAGNOSIS — M25532 Pain in left wrist: Secondary | ICD-10-CM

## 2015-05-18 HISTORY — DX: Pain in left hand: M79.642

## 2015-05-18 MED ORDER — METFORMIN HCL 500 MG PO TABS: 500.0000 mg | ORAL_TABLET | Freq: Two times a day (BID) | ORAL | Status: DC

## 2015-05-18 NOTE — Progress Notes (Signed)
Name: Rick [name]   MRN: [id number]    DOB: Mar 20, 1941   Date:05/18/2015       Progress Note  Subjective  Chief Complaint  Chief Complaint  Patient presents with  . Medication Problem    patient wants to discuss medications  . Diabetes  . Hypertension  . Hyperlipidemia   Diabetes He presents for his follow-up diabetic visit. He has type 2 diabetes mellitus. His disease course has been stable. Current diabetic treatment includes oral agent (dual therapy). He is following a generally healthy diet. (AM BG readings reviewed and are considered higher and not proportional to his recent A1c, which is consistent with well-controlled diabetes.)  Reviewed patient's logs which show that he is taking metformin 250 mg (half tablet of 500 mg) and glipizide 10 mg once daily. Ration is adamant that he needs to stay on glipizide as that's the only thing keeping his A1c under control. I am not sure if he is compliant with dietary measures for diabetes. Left Wrist Pain Pt. With left wrist pain, present for 1 week, gotten worse this AM and noticed some swelling in his wrist and the base of his thumb. Pt. Reportedly moved a big freezer yesterday and reports increased pain in his left wrist after he moved the freezer. Past Medical History  Diagnosis Date  . Diabetes mellitus without complication ([place])   . Hyperlipidemia   . Hypertension   . BPH (benign prostatic hypertrophy)   . Insomnia   . Bipolar disorder ([place])   . Peripheral neuropathy [facility])     Past Surgical History  Procedure Laterality Date  . Hernia repair    . Appendectomy    . Cholecystectomy      Family History  Problem Relation Age of Onset  . Transient ischemic attack Mother   . Diabetes Father   . Cancer Brother    Social History   Social History  . Marital Status: Divorced    Spouse Name: N/A  . Number of Children: N/A  . Years of Education: N/A   Occupational History  . Not on file.   Social History Main Topics  .  Smoking status: Former Smoker -- 10 years    Types: Cigarettes    Start date: 08/14/1994    Quit date: 03/23/2005  . Smokeless tobacco: Former Systems developer    Types: Snuff, Chew    Quit date: 03/23/2010  . Alcohol Use: No  . Drug Use: No  . Sexual Activity: No   Other Topics Concern  . Not on file   Social History Narrative    Current outpatient prescriptions:  .  carvedilol (COREG) 6.25 MG tablet, Take by mouth., Disp: , Rfl:  .  diclofenac sodium (VOLTAREN) 1 % GEL, Apply topically., Disp: , Rfl:  .  enalapril (VASOTEC) 10 MG tablet, Take by mouth., Disp: , Rfl:  .  finasteride (PROSCAR) 5 MG tablet, Take 5 mg by mouth., Disp: , Rfl:  .  gabapentin (NEURONTIN) 300 MG capsule, TAKE ONE CAPSULE BY MOUTH AT BEDTIME THEN TWICE DAILY FOR NERVE PAIN, Disp: , Rfl:  .  glipiZIDE (GLUCOTROL) 10 MG tablet, Take 1 tablet by mouth 2 (two) times daily., Disp: , Rfl:  .  glucose blood (ONE TOUCH ULTRA TEST) test strip, CHECK BLOOD SUGAR TWICE DAILY AS DIRECTED., Disp: , Rfl:  .  hydrochlorothiazide (HYDRODIURIL) 25 MG tablet, One a day prn for elevated BP or swelling., Disp: , Rfl:  .  lamoTRIgine (LAMICTAL) 100 MG tablet, Take by  mouth., Disp: , Rfl:  .  Lancets (ONETOUCH ULTRASOFT) lancets, CHECK BLOOD SUGAR TWICE DAILY AS DIRECTED., Disp: , Rfl:  .  lovastatin (MEVACOR) 20 MG tablet, Take 2 tablets (40 mg total) by mouth daily., Disp: 60 tablet, Rfl: 0 .  metFORMIN (GLUCOPHAGE) 500 MG tablet, TAKE ONE (1) TABLET EACH DAY FOR DIABETES, Disp: , Rfl:  .  mirtazapine (REMERON) 15 MG tablet, TAKE ONE TABLET AT BEDTIME, Disp: , Rfl:  .  mirtazapine (REMERON) 15 MG tablet, TAKE ONE TABLET AT BEDTIME, Disp: , Rfl:  .  traZODone (DESYREL) 100 MG tablet, TAKE ONE TABLET AT BEDTIME, Disp: , Rfl:   Allergies  Allergen Reactions  . Haloperidol     Other reaction(s): Unknown  . Sulfa Antibiotics     Other reaction(s): OTHER   Review of Systems  Constitutional: Negative for fever and chills.    Gastrointestinal: Negative for abdominal pain.  Musculoskeletal: Positive for joint pain.   Objective  Filed Vitals:   05/18/15 1042  BP: 138/74  Pulse: 130  Temp: 99 F (37.2 C)  TempSrc: Oral  Resp: 20  Height: 5\' 9"  (1.753 m)  Weight: 185 lb (83.915 kg)  SpO2: 96%    Physical Exam  Constitutional: He is well-developed, well-nourished, and in no distress.  Cardiovascular: Normal rate and regular rhythm.   Pulmonary/Chest: Effort normal and breath sounds normal.  Musculoskeletal:       Left hand: He exhibits decreased range of motion and tenderness.       Hands: swelling in the left wrist, tenderness to palpation, decreased ROM of the wrist.  Nursing note and vitals reviewed.  Recent Results (from the past 2160 hour(s))  POCT HgB A1C     Status: Normal   Collection Time: 05/11/15 11:21 AM  Result Value Ref Range   Hemoglobin A1C 5.7   Lipid Profile     Status: None   Collection Time: 05/11/15 11:55 AM  Result Value Ref Range   Cholesterol, Total 160 100 - 199 mg/dL   Triglycerides 148 0 - 149 mg/dL   HDL 51 >39 mg/dL    Comment: According to ATP-III Guidelines, HDL-C >59 mg/dL is considered a negative risk factor for CHD.    VLDL Cholesterol Cal 30 5 - 40 mg/dL   LDL Calculated 79 0 - 99 mg/dL   Chol/HDL Ratio 3.1 0.0 - 5.0 ratio units    Comment:                                   T. Chol/HDL Ratio                                             Men  Women                               1/2 Avg.Risk  3.4    3.3                                   Avg.Risk  5.0    4.4  2X Avg.Risk  9.6    7.1                                3X Avg.Risk 23.4   11.0   Comprehensive Metabolic Panel (CMET)     Status: Abnormal   Collection Time: 05/11/15 11:55 AM  Result Value Ref Range   Glucose 70 65 - 99 mg/dL   BUN 9 8 - 27 mg/dL   Creatinine, Ser 1.04 0.76 - 1.27 mg/dL   GFR calc non Af Amer 71 >59 mL/min/1.73   GFR calc Af Amer 82 >59 mL/min/1.73    BUN/Creatinine Ratio 9 (L) 10 - 22   Sodium 145 (H) 134 - 144 mmol/L   Potassium 5.1 3.5 - 5.2 mmol/L   Chloride 106 97 - 108 mmol/L   CO2 24 18 - 29 mmol/L   Calcium 9.8 8.6 - 10.2 mg/dL   Total Protein 7.3 6.0 - 8.5 g/dL   Albumin 5.0 (H) 3.5 - 4.8 g/dL   Globulin, Total 2.3 1.5 - 4.5 g/dL   Albumin/Globulin Ratio 2.2 1.1 - 2.5   Bilirubin Total 0.5 0.0 - 1.2 mg/dL   Alkaline Phosphatase 43 39 - 117 IU/L   AST 21 0 - 40 IU/L   ALT 23 0 - 44 IU/L   Assessment & Plan  1. Diabetic peripheral neuropathy associated with type 2 diabetes mellitus ([place]) Reviewed patient's medication for diabetes. A1c is 5.7%, consistent with excellent control of diabetes. Recommended had he discontinue taking glipizide and his metformin to 500 mg twice a day. Continue checking blood glucose every morning. Occurs to be compliant with dietary and lifestyle measures. Recheck A1c in 3-4 months. Patient verbalized understanding with the plan. - metFORMIN (GLUCOPHAGE) 500 MG tablet; Take 1 tablet (500 mg total) by mouth 2 (two) times daily with a meal.  Dispense: 180 tablet; Refill: 0  2. Left wrist pain Recommended an x-ray of left wrist and hand for evaluation of pain and swelling but patient has declined. He wants to watch his symptoms for now. Follow-up in one week if no clinical improvement.   Leshawn Straka Asad [facility] 05/18/2015 11:21 AM

## 2015-05-25 ENCOUNTER — Telehealth: Payer: Self-pay | Admitting: Family Medicine

## 2015-05-25 ENCOUNTER — Ambulatory Visit (INDEPENDENT_AMBULATORY_CARE_PROVIDER_SITE_OTHER): Payer: Medicare Other | Admitting: Family Medicine

## 2015-05-25 ENCOUNTER — Encounter: Payer: Self-pay | Admitting: Family Medicine

## 2015-05-25 VITALS — BP 140/70 | HR 106 | Temp 98.4°F | Resp 20 | Ht 69.0 in | Wt 183.2 lb

## 2015-05-25 DIAGNOSIS — M79642 Pain in left hand: Secondary | ICD-10-CM | POA: Diagnosis not present

## 2015-05-25 DIAGNOSIS — E118 Type 2 diabetes mellitus with unspecified complications: Secondary | ICD-10-CM

## 2015-05-25 NOTE — Telephone Encounter (Signed)
Pt would like a referral to [facility]. Pt needs complete eye exam and would like for this to be done here in [place].

## 2015-05-25 NOTE — Progress Notes (Signed)
Name: Rick [name]   MRN: [id number]    DOB: Apr 07, 1941   Date:05/25/2015       Progress Note  Subjective  Chief Complaint  Chief Complaint  Patient presents with  . Follow-up    1 wk wrist  . Hyperlipidemia  . Hypertension  . Diabetes  . Manic Behavior    HPI  Left Wrist Pain Pt. Presents for follow up of left wrist pain. He reportedly moved a heavy deep freezer 8-10 days ago and noticed pain in his left wrist and base of left thumb. He reports using 'that arthritis stuff' on the area along with Tylenol and reports that its better. Pt. Is able to move his wrist and thumb. No swelling at the site of pain.  Past Medical History  Diagnosis Date  . Diabetes mellitus without complication ([place])   . Hyperlipidemia   . Hypertension   . BPH (benign prostatic hypertrophy)   . Insomnia   . Bipolar disorder ([place])   . Peripheral neuropathy [facility])     Past Surgical History  Procedure Laterality Date  . Hernia repair    . Appendectomy    . Cholecystectomy      Family History  Problem Relation Age of Onset  . Transient ischemic attack Mother   . Diabetes Father   . Cancer Brother     Social History   Social History  . Marital Status: Divorced    Spouse Name: N/A  . Number of Children: N/A  . Years of Education: N/A   Occupational History  . Not on file.   Social History Main Topics  . Smoking status: Former Smoker -- 10 years    Types: Cigarettes    Start date: 08/14/1994    Quit date: 03/23/2005  . Smokeless tobacco: Former Systems developer    Types: Snuff, Chew    Quit date: 03/23/2010  . Alcohol Use: No  . Drug Use: No  . Sexual Activity: No   Other Topics Concern  . Not on file   Social History Narrative     Current outpatient prescriptions:  .  carvedilol (COREG) 6.25 MG tablet, Take by mouth., Disp: , Rfl:  .  diclofenac sodium (VOLTAREN) 1 % GEL, Apply topically., Disp: , Rfl:  .  enalapril (VASOTEC) 10 MG tablet, Take by mouth., Disp: , Rfl:  .   finasteride (PROSCAR) 5 MG tablet, Take 5 mg by mouth., Disp: , Rfl:  .  gabapentin (NEURONTIN) 300 MG capsule, TAKE ONE CAPSULE BY MOUTH AT BEDTIME THEN TWICE DAILY FOR NERVE PAIN, Disp: , Rfl:  .  glipiZIDE (GLUCOTROL) 10 MG tablet, Take 1 tablet by mouth 2 (two) times daily., Disp: , Rfl:  .  glucose blood (ONE TOUCH ULTRA TEST) test strip, CHECK BLOOD SUGAR TWICE DAILY AS DIRECTED., Disp: , Rfl:  .  hydrochlorothiazide (HYDRODIURIL) 25 MG tablet, One a day prn for elevated BP or swelling., Disp: , Rfl:  .  lamoTRIgine (LAMICTAL) 100 MG tablet, Take by mouth., Disp: , Rfl:  .  Lancets (ONETOUCH ULTRASOFT) lancets, CHECK BLOOD SUGAR TWICE DAILY AS DIRECTED., Disp: , Rfl:  .  lovastatin (MEVACOR) 20 MG tablet, Take 2 tablets (40 mg total) by mouth daily., Disp: 60 tablet, Rfl: 0 .  metFORMIN (GLUCOPHAGE) 500 MG tablet, Take 1 tablet (500 mg total) by mouth 2 (two) times daily with a meal., Disp: 180 tablet, Rfl: 0 .  mirtazapine (REMERON) 15 MG tablet, TAKE ONE TABLET AT BEDTIME, Disp: , Rfl:  .  mirtazapine (REMERON) 15 MG tablet, TAKE ONE TABLET AT BEDTIME, Disp: , Rfl:  .  traZODone (DESYREL) 100 MG tablet, TAKE ONE TABLET AT BEDTIME, Disp: , Rfl:   Allergies  Allergen Reactions  . Haloperidol     Other reaction(s): Unknown  . Sulfa Antibiotics     Other reaction(s): OTHER   Review of Systems  Constitutional: Negative for fever, chills and malaise/fatigue.  Musculoskeletal: Positive for joint pain.   Objective  Filed Vitals:   05/25/15 1204  BP: 140/70  Pulse: 106  Temp: 98.4 F (36.9 C)  TempSrc: Oral  Resp: 20  Height: 5\' 9"  (1.753 m)  Weight: 183 lb 3.2 oz (83.099 kg)  SpO2: 95%    Physical Exam  Constitutional: He is well-developed, well-nourished, and in no distress.  Musculoskeletal:       Left hand: Normal. He exhibits normal range of motion, no tenderness and no bony tenderness.  Normal exam of left hand and wrist. Mild tenderness to palpation over the base of  left thumb, no swelling. Full ROM.  Nursing note and vitals reviewed.  Assessment & Plan  1. Left hand pain Pain at the base of left thumb and wrist has improved although not resolved. Patient has good range of motion. Again recommended an x-ray for evaluation but patient feels that his symptoms are improving and he will consider x-ray if he still has residual symptoms after 1 week.  2. Controlled type 2 diabetes mellitus with complication, without long-term current use of insulin [facility]) Patient requesting referral to an ophthalmologist for diabetic eye exam. Referral completed and placed. - Ambulatory referral to Ophthalmology   [facility] 05/25/2015 12:47 PM

## 2015-06-08 ENCOUNTER — Other Ambulatory Visit: Payer: Self-pay

## 2015-06-08 DIAGNOSIS — E1142 Type 2 diabetes mellitus with diabetic polyneuropathy: Secondary | ICD-10-CM

## 2015-06-08 DIAGNOSIS — E785 Hyperlipidemia, unspecified: Secondary | ICD-10-CM

## 2015-06-08 DIAGNOSIS — I10 Essential (primary) hypertension: Secondary | ICD-10-CM

## 2015-06-08 MED ORDER — ENALAPRIL MALEATE 10 MG PO TABS: 10.0000 mg | ORAL_TABLET | Freq: Every day | ORAL | Status: DC

## 2015-06-08 MED ORDER — METFORMIN HCL 500 MG PO TABS: 500.0000 mg | ORAL_TABLET | Freq: Two times a day (BID) | ORAL | Status: DC

## 2015-06-08 MED ORDER — LOVASTATIN 20 MG PO TABS: 40.0000 mg | ORAL_TABLET | Freq: Every day | ORAL | Status: DC

## 2015-06-08 MED ORDER — HYDROCHLOROTHIAZIDE 25 MG PO TABS: 25.0000 mg | ORAL_TABLET | Freq: Every day | ORAL | Status: DC

## 2015-06-08 NOTE — Telephone Encounter (Signed)
Patient has a appointment at [facility] In January 2016. Per Marnette Burgess we have to be responsible for patient medication for 30 days after discharging him. Could you please fill 1 month Supply of patient medication. Thanks

## 2015-06-23 ENCOUNTER — Ambulatory Visit (INDEPENDENT_AMBULATORY_CARE_PROVIDER_SITE_OTHER): Payer: Medicare Other | Admitting: Family Medicine

## 2015-06-23 ENCOUNTER — Encounter: Payer: Self-pay | Admitting: Family Medicine

## 2015-06-23 VITALS — BP 126/78 | HR 104 | Temp 97.5°F | Resp 16 | Wt 182.6 lb

## 2015-06-23 DIAGNOSIS — R0789 Other chest pain: Secondary | ICD-10-CM

## 2015-06-23 HISTORY — DX: Other chest pain: R07.89

## 2015-06-23 NOTE — Progress Notes (Signed)
Name: Rick [name]   MRN: [id number]    DOB: [birth year]   Date:06/23/2015       Progress Note  Subjective  Chief Complaint  Chief Complaint  Patient presents with  . Chest Pain    patient stated that he has been having some chest pains for about 2-3 months. he is not sure if it is muscular or if it is cardiac. patient stated it happens most when there is excitment. pain is located near the left pectoral muscle that last for about 3-4 seconds then it goes away.  . Labs Only    patient is not schedule to have his HgA1c tested again until 08/10/15.     Chest Pain  This is a recurrent problem. Episode onset: 6 months ago. The onset quality is gradual. The problem has been waxing and waning. The pain is present in the lateral region. The pain is at a severity of 5/10. The pain is moderate. The quality of the pain is described as sharp. The pain does not radiate. Pertinent negatives include no abdominal pain, cough, dizziness, fever, irregular heartbeat, nausea, palpitations, shortness of breath or vomiting.    Past Medical History  Diagnosis Date  . Diabetes mellitus without complication ([place])   . Hyperlipidemia   . Hypertension   . BPH (benign prostatic hypertrophy)   . Insomnia   . Bipolar disorder ([place])   . Peripheral neuropathy [facility])     Past Surgical History  Procedure Laterality Date  . Hernia repair    . Appendectomy    . Cholecystectomy      Family History  Problem Relation Age of Onset  . Transient ischemic attack Mother   . Diabetes Father   . Cancer Brother     Social History   Social History  . Marital Status: Divorced    Spouse Name: N/A  . Number of Children: N/A  . Years of Education: N/A   Occupational History  . Not on file.   Social History Main Topics  . Smoking status: Former Smoker -- 10 years    Types: Cigarettes    Start date: 08/14/1994    Quit date: 03/23/2005  . Smokeless tobacco: Former Systems developer    Types: Snuff, Chew    Quit date:  03/23/2010  . Alcohol Use: No  . Drug Use: No  . Sexual Activity: No   Other Topics Concern  . Not on file   Social History Narrative     Current outpatient prescriptions:  .  carvedilol (COREG) 6.25 MG tablet, Take by mouth., Disp: , Rfl:  .  diclofenac sodium (VOLTAREN) 1 % GEL, Apply topically., Disp: , Rfl:  .  enalapril (VASOTEC) 10 MG tablet, Take 1 tablet (10 mg total) by mouth daily., Disp: 30 tablet, Rfl: 0 .  finasteride (PROSCAR) 5 MG tablet, Take 5 mg by mouth., Disp: , Rfl:  .  gabapentin (NEURONTIN) 300 MG capsule, TAKE ONE CAPSULE BY MOUTH AT BEDTIME THEN TWICE DAILY FOR NERVE PAIN, Disp: , Rfl:  .  glipiZIDE (GLUCOTROL) 10 MG tablet, Take 1 tablet by mouth 2 (two) times daily., Disp: , Rfl:  .  glucose blood (ONE TOUCH ULTRA TEST) test strip, CHECK BLOOD SUGAR TWICE DAILY AS DIRECTED., Disp: , Rfl:  .  hydrochlorothiazide (HYDRODIURIL) 25 MG tablet, Take 1 tablet (25 mg total) by mouth daily., Disp: 30 tablet, Rfl: 0 .  lamoTRIgine (LAMICTAL) 100 MG tablet, Take by mouth., Disp: , Rfl:  .  Lancets (ONETOUCH ULTRASOFT) lancets,  CHECK BLOOD SUGAR TWICE DAILY AS DIRECTED., Disp: , Rfl:  .  lovastatin (MEVACOR) 20 MG tablet, Take 2 tablets (40 mg total) by mouth at bedtime., Disp: 60 tablet, Rfl: 0 .  metFORMIN (GLUCOPHAGE) 500 MG tablet, Take 1 tablet (500 mg total) by mouth 2 (two) times daily with a meal., Disp: 180 tablet, Rfl: 0 .  mirtazapine (REMERON) 15 MG tablet, TAKE ONE TABLET AT BEDTIME, Disp: , Rfl:  .  mirtazapine (REMERON) 15 MG tablet, TAKE ONE TABLET AT BEDTIME, Disp: , Rfl:  .  traZODone (DESYREL) 100 MG tablet, TAKE ONE TABLET AT BEDTIME, Disp: , Rfl:   Allergies  Allergen Reactions  . Haloperidol     Other reaction(s): Unknown  . Sulfa Antibiotics     Other reaction(s): OTHER    Review of Systems  Constitutional: Negative for fever.  Respiratory: Negative for cough and shortness of breath.   Cardiovascular: Positive for chest pain. Negative for  palpitations.  Gastrointestinal: Negative for nausea, vomiting and abdominal pain.  Neurological: Negative for dizziness.    Objective  Filed Vitals:   06/23/15 1056  BP: 126/78  Pulse: 104  Temp: 97.5 F (36.4 C)  TempSrc: Oral  Resp: 16  Weight: 182 lb 9.6 oz (82.827 kg)  SpO2: 97%    Physical Exam  Constitutional: He is oriented to person, place, and time and well-developed, well-nourished, and in no distress.  HENT:  Head: Normocephalic and atraumatic.  Cardiovascular: Normal rate, regular rhythm and normal heart sounds.   No murmur heard. Pulmonary/Chest: Effort normal and breath sounds normal. He exhibits no tenderness.  Neurological: He is alert and oriented to person, place, and time.  Nursing note and vitals reviewed.   Assessment & Plan  1. Chest pain, atypical Atypical chest pain, low risk for cardiac etiology. EKG reviewed with patient. We'll refer to cardiology to consider stress test given risk factors (diabetes, dyslipidemia, hypertension) for heart disease.patient verbalized understanding with the plan. - EKG 12-Lead - DG Chest 2 View; Future - CBC with Differential - Comprehensive Metabolic Panel (CMET) - Ambulatory referral to Cardiology   [facility] 06/23/2015 12:07 PM

## 2015-07-15 DIAGNOSIS — E119 Type 2 diabetes mellitus without complications: Secondary | ICD-10-CM | POA: Diagnosis not present

## 2015-07-15 LAB — HM DIABETES EYE EXAM

## 2015-07-22 ENCOUNTER — Encounter: Payer: Self-pay | Admitting: Family Medicine

## 2015-07-30 DIAGNOSIS — H40033 Anatomical narrow angle, bilateral: Secondary | ICD-10-CM | POA: Diagnosis not present

## 2015-08-05 ENCOUNTER — Encounter: Payer: Self-pay | Admitting: Family Medicine

## 2015-08-05 ENCOUNTER — Ambulatory Visit (INDEPENDENT_AMBULATORY_CARE_PROVIDER_SITE_OTHER): Payer: Medicare Other | Admitting: Family Medicine

## 2015-08-05 VITALS — BP 137/74 | HR 88 | Resp 16 | Ht 69.0 in | Wt 187.6 lb

## 2015-08-05 DIAGNOSIS — F1021 Alcohol dependence, in remission: Secondary | ICD-10-CM | POA: Insufficient documentation

## 2015-08-05 DIAGNOSIS — Z7189 Other specified counseling: Secondary | ICD-10-CM

## 2015-08-05 DIAGNOSIS — F419 Anxiety disorder, unspecified: Secondary | ICD-10-CM

## 2015-08-05 DIAGNOSIS — M546 Pain in thoracic spine: Secondary | ICD-10-CM | POA: Insufficient documentation

## 2015-08-05 DIAGNOSIS — G64 Other disorders of peripheral nervous system: Secondary | ICD-10-CM | POA: Diagnosis not present

## 2015-08-05 DIAGNOSIS — H6122 Impacted cerumen, left ear: Secondary | ICD-10-CM | POA: Insufficient documentation

## 2015-08-05 DIAGNOSIS — F418 Other specified anxiety disorders: Secondary | ICD-10-CM

## 2015-08-05 DIAGNOSIS — Z8551 Personal history of malignant neoplasm of bladder: Secondary | ICD-10-CM | POA: Insufficient documentation

## 2015-08-05 DIAGNOSIS — L700 Acne vulgaris: Secondary | ICD-10-CM | POA: Insufficient documentation

## 2015-08-05 DIAGNOSIS — N200 Calculus of kidney: Secondary | ICD-10-CM | POA: Insufficient documentation

## 2015-08-05 DIAGNOSIS — Z8669 Personal history of other diseases of the nervous system and sense organs: Secondary | ICD-10-CM | POA: Insufficient documentation

## 2015-08-05 DIAGNOSIS — F329 Major depressive disorder, single episode, unspecified: Secondary | ICD-10-CM

## 2015-08-05 DIAGNOSIS — C679 Malignant neoplasm of bladder, unspecified: Secondary | ICD-10-CM | POA: Diagnosis not present

## 2015-08-05 DIAGNOSIS — I10 Essential (primary) hypertension: Secondary | ICD-10-CM

## 2015-08-05 DIAGNOSIS — E119 Type 2 diabetes mellitus without complications: Secondary | ICD-10-CM | POA: Insufficient documentation

## 2015-08-05 DIAGNOSIS — N400 Enlarged prostate without lower urinary tract symptoms: Secondary | ICD-10-CM | POA: Diagnosis not present

## 2015-08-05 DIAGNOSIS — E785 Hyperlipidemia, unspecified: Secondary | ICD-10-CM

## 2015-08-05 DIAGNOSIS — H7090 Unspecified mastoiditis, unspecified ear: Secondary | ICD-10-CM | POA: Insufficient documentation

## 2015-08-05 DIAGNOSIS — Z7689 Persons encountering health services in other specified circumstances: Secondary | ICD-10-CM | POA: Insufficient documentation

## 2015-08-05 DIAGNOSIS — E1142 Type 2 diabetes mellitus with diabetic polyneuropathy: Secondary | ICD-10-CM

## 2015-08-05 DIAGNOSIS — L989 Disorder of the skin and subcutaneous tissue, unspecified: Secondary | ICD-10-CM | POA: Insufficient documentation

## 2015-08-05 MED ORDER — CARVEDILOL 6.25 MG PO TABS: 6.2500 mg | ORAL_TABLET | Freq: Two times a day (BID) | ORAL | Status: DC

## 2015-08-05 MED ORDER — LOVASTATIN 40 MG PO TABS: 40.0000 mg | ORAL_TABLET | Freq: Every day | ORAL | Status: DC

## 2015-08-05 MED ORDER — HYDROCHLOROTHIAZIDE 25 MG PO TABS: 25.0000 mg | ORAL_TABLET | Freq: Every day | ORAL | Status: DC

## 2015-08-05 MED ORDER — GABAPENTIN 300 MG PO CAPS: 300.0000 mg | ORAL_CAPSULE | Freq: Two times a day (BID) | ORAL | Status: DC

## 2015-08-05 MED ORDER — FINASTERIDE 5 MG PO TABS: 5.0000 mg | ORAL_TABLET | Freq: Every day | ORAL | Status: DC

## 2015-08-05 MED ORDER — METFORMIN HCL 500 MG PO TABS: 500.0000 mg | ORAL_TABLET | Freq: Two times a day (BID) | ORAL | Status: DC

## 2015-08-05 MED ORDER — ENALAPRIL MALEATE 10 MG PO TABS: 10.0000 mg | ORAL_TABLET | Freq: Every day | ORAL | Status: DC

## 2015-08-05 MED ORDER — GLIMEPIRIDE 4 MG PO TABS: 4.0000 mg | ORAL_TABLET | Freq: Every day | ORAL | Status: DC

## 2015-08-05 NOTE — Progress Notes (Signed)
Name: Rick [name]   MRN: [id number]    DOB: 06-Apr-1941   Date:08/05/2015       Progress Note  Subjective  Chief Complaint  Chief Complaint  Patient presents with  . Establish Care    5.7 05/11/15  Will do on next appt. wants 500mg  more of Metformin or Glipizide. 80-190 BS    HPI Here to establish care.  Has complicated health history.  Has DM, HBP, Psycyiatriic disorder (?bipolar)., hx. Of bladder cancer, BPH, elevated lipids,   No problem-specific assessment & plan notes found for this encounter.   Past Medical History  Diagnosis Date  . Diabetes mellitus without complication ([place])   . Hyperlipidemia   . Hypertension   . BPH (benign prostatic hypertrophy)   . Insomnia   . Bipolar disorder ([place])   . Peripheral neuropathy [facility])     Past Surgical History  Procedure Laterality Date  . Hernia repair    . Appendectomy    . Cholecystectomy      Family History  Problem Relation Age of Onset  . Transient ischemic attack Mother   . Diabetes Father   . Cancer Brother     Social History   Social History  . Marital Status: Divorced    Spouse Name: N/A  . Number of Children: N/A  . Years of Education: N/A   Occupational History  . Not on file.   Social History Main Topics  . Smoking status: Former Smoker -- 10 years    Types: Cigarettes    Start date: 08/14/1994    Quit date: 03/23/2005  . Smokeless tobacco: Former Systems developer    Types: Snuff, Chew    Quit date: 03/23/2010  . Alcohol Use: No  . Drug Use: No  . Sexual Activity: No   Other Topics Concern  . Not on file   Social History Narrative     Current outpatient prescriptions:  .  enalapril (VASOTEC) 10 MG tablet, Take 1 tablet (10 mg total) by mouth daily., Disp: 30 tablet, Rfl: 6 .  finasteride (PROSCAR) 5 MG tablet, Take 1 tablet (5 mg total) by mouth daily., Disp: 30 tablet, Rfl: 6 .  gabapentin (NEURONTIN) 300 MG capsule, Take 1 capsule (300 mg total) by mouth 2 (two) times daily., Disp: 60  capsule, Rfl: 6 .  glucose blood (ONE TOUCH ULTRA TEST) test strip, CHECK BLOOD SUGAR TWICE DAILY AS DIRECTED., Disp: , Rfl:  .  hydrochlorothiazide (HYDRODIURIL) 25 MG tablet, Take 1 tablet (25 mg total) by mouth daily., Disp: 30 tablet, Rfl: 6 .  Lancets (ONETOUCH ULTRASOFT) lancets, CHECK BLOOD SUGAR TWICE DAILY AS DIRECTED., Disp: , Rfl:  .  lovastatin (MEVACOR) 40 MG tablet, Take 1 tablet (40 mg total) by mouth at bedtime., Disp: 30 tablet, Rfl: 6 .  metFORMIN (GLUCOPHAGE) 500 MG tablet, Take 1 tablet (500 mg total) by mouth 2 (two) times daily with a meal., Disp: 60 tablet, Rfl: 6 .  mirtazapine (REMERON) 15 MG tablet, TAKE ONE TABLET AT BEDTIME, Disp: , Rfl:  .  traZODone (DESYREL) 100 MG tablet, TAKE ONE TABLET AT BEDTIME, Disp: , Rfl:  .  carvedilol (COREG) 6.25 MG tablet, Take 1 tablet (6.25 mg total) by mouth 2 (two) times daily with a meal., Disp: 60 tablet, Rfl: 6 .  glimepiride (AMARYL) 4 MG tablet, Take 1 tablet (4 mg total) by mouth daily before breakfast., Disp: 30 tablet, Rfl: 6 .  lamoTRIgine (LAMICTAL) 100 MG tablet, Take by mouth., Disp: , Rfl:  Allergies  Allergen Reactions  . Haloperidol     Other reaction(s): Unknown  . Sulfa Antibiotics     Other reaction(s): OTHER     Review of Systems  Constitutional: Negative for fever, chills, weight loss and malaise/fatigue.  HENT: Negative for hearing loss.   Eyes: Negative for blurred vision and double vision.  Respiratory: Negative for cough, shortness of breath and wheezing.   Cardiovascular: Negative for chest pain, palpitations and leg swelling.  Gastrointestinal: Negative for heartburn, abdominal pain and blood in stool.  Genitourinary: Negative for dysuria, urgency and frequency.  Musculoskeletal: Negative for myalgias and joint pain.  Skin: Negative for rash.  Neurological: Negative for dizziness, tremors, weakness and headaches.      Objective  Filed Vitals:   08/05/15 1031 08/05/15 1110  BP: 137/74    Pulse: 75 88  Resp: 16   Height: 5\' 9"  (1.753 m)   Weight: 187 lb 9.6 oz (85.095 kg)     Physical Exam  Constitutional: He is oriented to person, place, and time and well-developed, well-nourished, and in no distress. No distress.  HENT:  Head: Normocephalic and atraumatic.  Eyes: Conjunctivae and EOM are normal. Pupils are equal, round, and reactive to light. No scleral icterus.  Neck: Normal range of motion. Neck supple. Carotid bruit is not present. No thyromegaly present.  Cardiovascular: Normal rate, regular rhythm and normal heart sounds.  Exam reveals no gallop and no friction rub.   No murmur heard. Pulmonary/Chest: Effort normal and breath sounds normal. No respiratory distress. He has no wheezes. He has no rales.  Abdominal: Soft. Bowel sounds are normal. He exhibits no distension, no abdominal bruit and no mass. There is no tenderness.  Multiple surgical scars on abd.  Musculoskeletal: He exhibits no edema.  Lymphadenopathy:    He has no cervical adenopathy.  Neurological: He is alert and oriented to person, place, and time.  Psychiatric:  Excessively talkative.  Vitals reviewed.      Recent Results (from the past 2160 hour(s))  POCT HgB A1C     Status: Normal   Collection Time: 05/11/15 11:21 AM  Result Value Ref Range   Hemoglobin A1C 5.7   Lipid Profile     Status: None   Collection Time: 05/11/15 11:55 AM  Result Value Ref Range   Cholesterol, Total 160 100 - 199 mg/dL   Triglycerides 148 0 - 149 mg/dL   HDL 51 >39 mg/dL    Comment: According to ATP-III Guidelines, HDL-C >59 mg/dL is considered a negative risk factor for CHD.    VLDL Cholesterol Cal 30 5 - 40 mg/dL   LDL Calculated 79 0 - 99 mg/dL   Chol/HDL Ratio 3.1 0.0 - 5.0 ratio units    Comment:                                   T. Chol/HDL Ratio                                             Men  Women                               1/2 Avg.Risk  3.4    3.3  Avg.Risk  5.0    4.4                                2X Avg.Risk  9.6    7.1                                3X Avg.Risk 23.4   11.0   Comprehensive Metabolic Panel (CMET)     Status: Abnormal   Collection Time: 05/11/15 11:55 AM  Result Value Ref Range   Glucose 70 65 - 99 mg/dL   BUN 9 8 - 27 mg/dL   Creatinine, Ser 1.04 0.76 - 1.27 mg/dL   GFR calc non Af Amer 71 >59 mL/min/1.73   GFR calc Af Amer 82 >59 mL/min/1.73   BUN/Creatinine Ratio 9 (L) 10 - 22   Sodium 145 (H) 134 - 144 mmol/L   Potassium 5.1 3.5 - 5.2 mmol/L   Chloride 106 97 - 108 mmol/L   CO2 24 18 - 29 mmol/L   Calcium 9.8 8.6 - 10.2 mg/dL   Total Protein 7.3 6.0 - 8.5 g/dL   Albumin 5.0 (H) 3.5 - 4.8 g/dL   Globulin, Total 2.3 1.5 - 4.5 g/dL   Albumin/Globulin Ratio 2.2 1.1 - 2.5   Bilirubin Total 0.5 0.0 - 1.2 mg/dL   Alkaline Phosphatase 43 39 - 117 IU/L   AST 21 0 - 40 IU/L   ALT 23 0 - 44 IU/L  HM DIABETES EYE EXAM     Status: None   Collection Time: 07/15/15 12:00 AM  Result Value Ref Range   HM Diabetic Eye Exam No Retinopathy No Retinopathy     Assessment & Plan  Problem List Items Addressed This Visit      Cardiovascular and Mediastinum   Hypertension   Relevant Medications   carvedilol (COREG) 6.25 MG tablet   lovastatin (MEVACOR) 40 MG tablet   enalapril (VASOTEC) 10 MG tablet   hydrochlorothiazide (HYDRODIURIL) 25 MG tablet   Other Relevant Orders   Comprehensive Metabolic Panel (CMET)     Nervous and Auditory   Diabetic peripheral neuropathy associated with type 2 diabetes mellitus (HCC)   Relevant Medications   lovastatin (MEVACOR) 40 MG tablet   glimepiride (AMARYL) 4 MG tablet   enalapril (VASOTEC) 10 MG tablet   metFORMIN (GLUCOPHAGE) 500 MG tablet   gabapentin (NEURONTIN) 300 MG capsule   Other Relevant Orders   Comprehensive Metabolic Panel (CMET)   HgB A1c   Disorder of peripheral nervous system (HCC)   Relevant Medications   gabapentin (NEURONTIN) 300 MG capsule      Genitourinary   Malignant neoplasm of urinary bladder (HCC)   Relevant Medications   gabapentin (NEURONTIN) 300 MG capsule   Other Relevant Orders   CBC with Differential   Benign fibroma of prostate   Relevant Medications   finasteride (PROSCAR) 5 MG tablet     Other   Anxiety and depression   Relevant Orders   Ambulatory referral to Psychiatry   Encounter to establish care - Primary    Other Visit Diagnoses    Dyslipidemia        Relevant Medications    lovastatin (MEVACOR) 40 MG tablet    Other Relevant Orders    Lipid Profile       Meds ordered this encounter  Medications  . carvedilol (COREG) 6.25 MG tablet  Sig: Take 1 tablet (6.25 mg total) by mouth 2 (two) times daily with a meal.    Dispense:  60 tablet    Refill:  6  . finasteride (PROSCAR) 5 MG tablet    Sig: Take 1 tablet (5 mg total) by mouth daily.    Dispense:  30 tablet    Refill:  6  . lovastatin (MEVACOR) 40 MG tablet    Sig: Take 1 tablet (40 mg total) by mouth at bedtime.    Dispense:  30 tablet    Refill:  6  . glimepiride (AMARYL) 4 MG tablet    Sig: Take 1 tablet (4 mg total) by mouth daily before breakfast.    Dispense:  30 tablet    Refill:  6  . enalapril (VASOTEC) 10 MG tablet    Sig: Take 1 tablet (10 mg total) by mouth daily.    Dispense:  30 tablet    Refill:  6  . hydrochlorothiazide (HYDRODIURIL) 25 MG tablet    Sig: Take 1 tablet (25 mg total) by mouth daily.    Dispense:  30 tablet    Refill:  6  . metFORMIN (GLUCOPHAGE) 500 MG tablet    Sig: Take 1 tablet (500 mg total) by mouth 2 (two) times daily with a meal.    Dispense:  60 tablet    Refill:  6  . gabapentin (NEURONTIN) 300 MG capsule    Sig: Take 1 capsule (300 mg total) by mouth 2 (two) times daily.    Dispense:  60 capsule    Refill:  6   1. Dyslipidemia  - lovastatin (MEVACOR) 40 MG tablet; Take 1 tablet (40 mg total) by mouth at bedtime.  Dispense: 30 tablet; Refill: 6 - Lipid Profile  2. Essential  hypertension  - carvedilol (COREG) 6.25 MG tablet; Take 1 tablet (6.25 mg total) by mouth 2 (two) times daily with a meal.  Dispense: 60 tablet; Refill: 6 - enalapril (VASOTEC) 10 MG tablet; Take 1 tablet (10 mg total) by mouth daily.  Dispense: 30 tablet; Refill: 6 - hydrochlorothiazide (HYDRODIURIL) 25 MG tablet; Take 1 tablet (25 mg total) by mouth daily.  Dispense: 30 tablet; Refill: 6 - Comprehensive Metabolic Panel (CMET)  3. Diabetic peripheral neuropathy associated with type 2 diabetes mellitus (HCC)  - glimepiride (AMARYL) 4 MG tablet; Take 1 tablet (4 mg total) by mouth daily before breakfast.  Dispense: 30 tablet; Refill: 6 - metFORMIN (GLUCOPHAGE) 500 MG tablet; Take 1 tablet (500 mg total) by mouth 2 (two) times daily with a meal.  Dispense: 60 tablet; Refill: 6 - Comprehensive Metabolic Panel (CMET) - HgB A1c  4. Encounter to establish care   5. Disorder of peripheral nervous system (HCC)  - gabapentin (NEURONTIN) 300 MG capsule; Take 1 capsule (300 mg total) by mouth 2 (two) times daily.  Dispense: 60 capsule; Refill: 6  6. Malignant neoplasm of urinary bladder, unspecified site (HCC)  - CBC with Differential  7. Benign fibroma of prostate  - finasteride (PROSCAR) 5 MG tablet; Take 1 tablet (5 mg total) by mouth daily.  Dispense: 30 tablet; Refill: 6  8. Anxiety and depression  - Ambulatory referral to Psychiatry

## 2015-08-06 ENCOUNTER — Telehealth: Payer: Self-pay | Admitting: Family Medicine

## 2015-08-06 NOTE — Telephone Encounter (Signed)
LMTCB SEE NOTES BELOW

## 2015-08-06 NOTE — Telephone Encounter (Signed)
Per doctor Luan Pulling.Marland KitchenMarland KitchenAs discussed in detail at appt yesterday we will NOT be giving any sleeping meds and will NOT decline need for Psychiatry. How often he goes there is up to Psych not [place]. If he does not care to comply he is welcome to see another PCP and transfer care.[facility]

## 2015-08-06 NOTE — Telephone Encounter (Signed)
Pt asked if Dr. Luan Pulling could give him anything to help him sleep besides a psychiatric medication.  He did ask for the phone number to [facility] but he doesn't feel like he should have to see a psychiatrist ever three months to get medication.  Please call [phone]

## 2015-08-10 ENCOUNTER — Ambulatory Visit: Payer: Medicare Other | Admitting: Family Medicine

## 2015-08-17 DIAGNOSIS — C679 Malignant neoplasm of bladder, unspecified: Secondary | ICD-10-CM | POA: Diagnosis not present

## 2015-08-17 DIAGNOSIS — E1142 Type 2 diabetes mellitus with diabetic polyneuropathy: Secondary | ICD-10-CM | POA: Diagnosis not present

## 2015-08-17 DIAGNOSIS — I10 Essential (primary) hypertension: Secondary | ICD-10-CM | POA: Diagnosis not present

## 2015-08-17 DIAGNOSIS — E785 Hyperlipidemia, unspecified: Secondary | ICD-10-CM | POA: Diagnosis not present

## 2015-08-18 LAB — COMPREHENSIVE METABOLIC PANEL
ALBUMIN: 4.9 g/dL — AB (ref 3.5–4.8)
ALK PHOS: 43 IU/L (ref 39–117)
ALT: 32 IU/L (ref 0–44)
AST: 24 IU/L (ref 0–40)
Albumin/Globulin Ratio: 1.8 (ref 1.1–2.5)
BILIRUBIN TOTAL: 0.8 mg/dL (ref 0.0–1.2)
BUN/Creatinine Ratio: 14 (ref 10–22)
BUN: 15 mg/dL (ref 8–27)
CHLORIDE: 97 mmol/L (ref 96–106)
CO2: 24 mmol/L (ref 18–29)
CREATININE: 1.06 mg/dL (ref 0.76–1.27)
Calcium: 10.4 mg/dL — ABNORMAL HIGH (ref 8.6–10.2)
GFR calc Af Amer: 80 mL/min/{1.73_m2} (ref >59)
GFR calc non Af Amer: 69 mL/min/{1.73_m2} (ref >59)
GLUCOSE: 129 mg/dL — AB (ref 65–99)
Globulin, Total: 2.7 g/dL (ref 1.5–4.5)
Potassium: 4.5 mmol/L (ref 3.5–5.2)
Sodium: 140 mmol/L (ref 134–144)
Total Protein: 7.6 g/dL (ref 6.0–8.5)

## 2015-08-18 LAB — CBC WITH DIFFERENTIAL/PLATELET
BASOS ABS: 0.1 10*3/uL (ref 0.0–0.2)
BASOS: 1 %
EOS (ABSOLUTE): 0.3 10*3/uL (ref 0.0–0.4)
Eos: 4 %
HEMOGLOBIN: 16.4 g/dL (ref 12.6–17.7)
Hematocrit: 46.4 % (ref 37.5–51.0)
IMMATURE GRANS (ABS): 0 10*3/uL (ref 0.0–0.1)
IMMATURE GRANULOCYTES: 1 %
Lymphocytes Absolute: 2.5 10*3/uL (ref 0.7–3.1)
Lymphs: 28 %
MCH: 30 pg (ref 26.6–33.0)
MCHC: 35.3 g/dL (ref 31.5–35.7)
MCV: 85 fL (ref 79–97)
MONOCYTES: 9 %
Monocytes Absolute: 0.8 10*3/uL (ref 0.1–0.9)
NEUTROS ABS: 5.1 10*3/uL (ref 1.4–7.0)
NEUTROS PCT: 57 %
PLATELETS: 277 10*3/uL (ref 150–379)
RBC: 5.46 x10E6/uL (ref 4.14–5.80)
RDW: 14 % (ref 12.3–15.4)
WBC: 8.8 10*3/uL (ref 3.4–10.8)

## 2015-08-18 LAB — LIPID PANEL
CHOLESTEROL TOTAL: 195 mg/dL (ref 100–199)
Chol/HDL Ratio: 4.2 ratio units (ref 0.0–5.0)
HDL: 46 mg/dL (ref >39)
LDL CALC: 93 mg/dL (ref 0–99)
TRIGLYCERIDES: 281 mg/dL — AB (ref 0–149)
VLDL CHOLESTEROL CAL: 56 mg/dL — AB (ref 5–40)

## 2015-08-18 LAB — HEMOGLOBIN A1C
Est. average glucose Bld gHb Est-mCnc: 134 mg/dL
Hgb A1c MFr Bld: 6.3 % — ABNORMAL HIGH (ref 4.8–5.6)

## 2015-08-24 ENCOUNTER — Telehealth: Payer: Self-pay | Admitting: Family Medicine

## 2015-08-24 NOTE — Telephone Encounter (Signed)
17 mins was spent going over patient labs on 08/18/15. Per patient's request a copy of labs were also mailed to him.

## 2015-08-24 NOTE — Telephone Encounter (Signed)
Pt asked to have someone give him a call about lab results.  Please call [phone]

## 2015-08-25 DIAGNOSIS — H40033 Anatomical narrow angle, bilateral: Secondary | ICD-10-CM | POA: Diagnosis not present

## 2015-08-31 ENCOUNTER — Other Ambulatory Visit: Payer: Self-pay | Admitting: *Deleted

## 2015-08-31 DIAGNOSIS — N400 Enlarged prostate without lower urinary tract symptoms: Secondary | ICD-10-CM

## 2015-08-31 MED ORDER — FINASTERIDE 5 MG PO TABS: 5.0000 mg | ORAL_TABLET | Freq: Every day | ORAL | Status: DC

## 2015-09-02 ENCOUNTER — Ambulatory Visit: Payer: Medicare Other | Admitting: Family Medicine

## 2015-09-10 ENCOUNTER — Telehealth: Payer: Self-pay | Admitting: Family Medicine

## 2015-09-10 NOTE — Telephone Encounter (Signed)
Pt would like to go back on glipzide instead of what he is taking now.  He said his readings were better with previous medication.  Please call [phone]

## 2015-09-13 NOTE — Telephone Encounter (Signed)
I don't like glipizide.  Glimeperide is a better drug and more like his own body.  Want him to stay on this medication.-jh

## 2015-09-13 NOTE — Telephone Encounter (Signed)
Have him make an appointment to see me to discuss.  Will get A1c on this visit to look at control.  Need 45 min appt. for this.-jh

## 2015-09-13 NOTE — Telephone Encounter (Signed)
Patient aware but disagrees. Patient insist that you are imposing on him. He know that Glipizide controls blood sugars better. His blood sugars are higher now on Glimeperide. If bs are above 180-200 he has concerns about what he's taking. His diet has not changed.

## 2015-09-26 ENCOUNTER — Emergency Department: Payer: Medicare Other

## 2015-09-26 ENCOUNTER — Encounter: Payer: Self-pay | Admitting: Emergency Medicine

## 2015-09-26 ENCOUNTER — Observation Stay
Admission: EM | Admit: 2015-09-26 | Discharge: 2015-09-29 | Disposition: A | Discharge status: Home / Self Care | Payer: Medicare Other | Attending: Surgery | Admitting: Surgery

## 2015-09-26 DIAGNOSIS — Z419 Encounter for procedure for purposes other than remedying health state, unspecified: Secondary | ICD-10-CM

## 2015-09-26 DIAGNOSIS — S82892A Other fracture of left lower leg, initial encounter for closed fracture: Secondary | ICD-10-CM | POA: Diagnosis present

## 2015-09-26 DIAGNOSIS — Z87891 Personal history of nicotine dependence: Secondary | ICD-10-CM | POA: Diagnosis not present

## 2015-09-26 DIAGNOSIS — Z9889 Other specified postprocedural states: Secondary | ICD-10-CM | POA: Insufficient documentation

## 2015-09-26 DIAGNOSIS — Z8554 Personal history of malignant neoplasm of ureter: Secondary | ICD-10-CM | POA: Insufficient documentation

## 2015-09-26 DIAGNOSIS — M549 Dorsalgia, unspecified: Secondary | ICD-10-CM | POA: Insufficient documentation

## 2015-09-26 DIAGNOSIS — Z9049 Acquired absence of other specified parts of digestive tract: Secondary | ICD-10-CM | POA: Insufficient documentation

## 2015-09-26 DIAGNOSIS — N529 Male erectile dysfunction, unspecified: Secondary | ICD-10-CM | POA: Insufficient documentation

## 2015-09-26 DIAGNOSIS — H7090 Unspecified mastoiditis, unspecified ear: Secondary | ICD-10-CM | POA: Insufficient documentation

## 2015-09-26 DIAGNOSIS — N400 Enlarged prostate without lower urinary tract symptoms: Secondary | ICD-10-CM | POA: Diagnosis not present

## 2015-09-26 DIAGNOSIS — F411 Generalized anxiety disorder: Secondary | ICD-10-CM | POA: Diagnosis not present

## 2015-09-26 DIAGNOSIS — E785 Hyperlipidemia, unspecified: Secondary | ICD-10-CM | POA: Diagnosis not present

## 2015-09-26 DIAGNOSIS — S82852A Displaced trimalleolar fracture of left lower leg, initial encounter for closed fracture: Secondary | ICD-10-CM | POA: Diagnosis present

## 2015-09-26 DIAGNOSIS — N200 Calculus of kidney: Secondary | ICD-10-CM | POA: Insufficient documentation

## 2015-09-26 DIAGNOSIS — F1020 Alcohol dependence, uncomplicated: Secondary | ICD-10-CM | POA: Insufficient documentation

## 2015-09-26 DIAGNOSIS — Z833 Family history of diabetes mellitus: Secondary | ICD-10-CM | POA: Insufficient documentation

## 2015-09-26 DIAGNOSIS — I10 Essential (primary) hypertension: Secondary | ICD-10-CM | POA: Diagnosis not present

## 2015-09-26 DIAGNOSIS — G43909 Migraine, unspecified, not intractable, without status migrainosus: Secondary | ICD-10-CM | POA: Insufficient documentation

## 2015-09-26 DIAGNOSIS — Z85828 Personal history of other malignant neoplasm of skin: Secondary | ICD-10-CM | POA: Diagnosis not present

## 2015-09-26 DIAGNOSIS — N411 Chronic prostatitis: Secondary | ICD-10-CM | POA: Diagnosis not present

## 2015-09-26 DIAGNOSIS — E1142 Type 2 diabetes mellitus with diabetic polyneuropathy: Secondary | ICD-10-CM | POA: Diagnosis not present

## 2015-09-26 DIAGNOSIS — L700 Acne vulgaris: Secondary | ICD-10-CM | POA: Diagnosis not present

## 2015-09-26 DIAGNOSIS — H6122 Impacted cerumen, left ear: Secondary | ICD-10-CM | POA: Insufficient documentation

## 2015-09-26 DIAGNOSIS — F319 Bipolar disorder, unspecified: Secondary | ICD-10-CM | POA: Insufficient documentation

## 2015-09-26 DIAGNOSIS — S82842A Displaced bimalleolar fracture of left lower leg, initial encounter for closed fracture: Principal | ICD-10-CM | POA: Insufficient documentation

## 2015-09-26 DIAGNOSIS — I4892 Unspecified atrial flutter: Secondary | ICD-10-CM | POA: Insufficient documentation

## 2015-09-26 DIAGNOSIS — W109XXA Fall (on) (from) unspecified stairs and steps, initial encounter: Secondary | ICD-10-CM | POA: Insufficient documentation

## 2015-09-26 DIAGNOSIS — Z823 Family history of stroke: Secondary | ICD-10-CM | POA: Insufficient documentation

## 2015-09-26 DIAGNOSIS — M25571 Pain in right ankle and joints of right foot: Secondary | ICD-10-CM | POA: Diagnosis not present

## 2015-09-26 DIAGNOSIS — Z7984 Long term (current) use of oral hypoglycemic drugs: Secondary | ICD-10-CM | POA: Insufficient documentation

## 2015-09-26 DIAGNOSIS — Z8551 Personal history of malignant neoplasm of bladder: Secondary | ICD-10-CM | POA: Diagnosis not present

## 2015-09-26 HISTORY — DX: Malignant neoplasm of bladder, unspecified: C67.9

## 2015-09-26 HISTORY — DX: Unspecified malignant neoplasm of skin, unspecified: C44.90

## 2015-09-26 HISTORY — DX: Other acute appendicitis without perforation, with gangrene: K35.891

## 2015-09-26 LAB — CBC WITH DIFFERENTIAL/PLATELET
BASOS ABS: 0.1 10*3/uL (ref 0–0.1)
BASOS PCT: 1 %
EOS ABS: 0.2 10*3/uL (ref 0–0.7)
EOS PCT: 2 %
HCT: 43.9 % (ref 40.0–52.0)
HEMOGLOBIN: 15.1 g/dL (ref 13.0–18.0)
LYMPHS ABS: 1.4 10*3/uL (ref 1.0–3.6)
Lymphocytes Relative: 16 %
MCH: 29.5 pg (ref 26.0–34.0)
MCHC: 34.3 g/dL (ref 32.0–36.0)
MCV: 86 fL (ref 80.0–100.0)
Monocytes Absolute: 0.6 10*3/uL (ref 0.2–1.0)
Monocytes Relative: 7 %
NEUTROS PCT: 74 %
Neutro Abs: 6.3 10*3/uL (ref 1.4–6.5)
PLATELETS: 224 10*3/uL (ref 150–440)
RBC: 5.11 MIL/uL (ref 4.40–5.90)
RDW: 13.7 % (ref 11.5–14.5)
WBC: 8.5 10*3/uL (ref 3.8–10.6)

## 2015-09-26 LAB — BASIC METABOLIC PANEL
ANION GAP: 11 (ref 5–15)
BUN: 23 mg/dL — AB (ref 6–20)
CHLORIDE: 106 mmol/L (ref 101–111)
CO2: 22 mmol/L (ref 22–32)
Calcium: 9.4 mg/dL (ref 8.9–10.3)
Creatinine, Ser: 0.99 mg/dL (ref 0.61–1.24)
Glucose, Bld: 141 mg/dL — ABNORMAL HIGH (ref 65–99)
POTASSIUM: 4.3 mmol/L (ref 3.5–5.1)
SODIUM: 139 mmol/L (ref 135–145)

## 2015-09-26 LAB — SURGICAL PCR SCREEN
MRSA, PCR: NEGATIVE
STAPHYLOCOCCUS AUREUS: NEGATIVE

## 2015-09-26 LAB — GLUCOSE, CAPILLARY: Glucose-Capillary: 98 mg/dL (ref 65–99)

## 2015-09-26 MED ORDER — FLEET ENEMA 7-19 GM/118ML RE ENEM: 1.0000 | ENEMA | Freq: Once | RECTAL | Status: DC | PRN

## 2015-09-26 MED ORDER — PANTOPRAZOLE SODIUM 40 MG IV SOLR
40.0000 mg | Freq: Every day | INTRAVENOUS | Status: DC
  Administered 2015-09-26 – 2015-09-28 (×3): 40 mg via INTRAVENOUS
  Filled 2015-09-26 (×3): qty 40

## 2015-09-26 MED ORDER — MORPHINE SULFATE (PF) 4 MG/ML IV SOLN: 4.0000 mg | INTRAVENOUS | Status: DC | PRN

## 2015-09-26 MED ORDER — INSULIN ASPART 100 UNIT/ML ~~LOC~~ SOLN
0.0000 [IU] | Freq: Every day | SUBCUTANEOUS | Status: DC
  Administered 2015-09-28: 2 [IU] via SUBCUTANEOUS

## 2015-09-26 MED ORDER — TRAZODONE HCL 100 MG PO TABS
100.0000 mg | ORAL_TABLET | Freq: Every day | ORAL | Status: DC
  Administered 2015-09-26 – 2015-09-28 (×3): 100 mg via ORAL
  Filled 2015-09-26 (×3): qty 1

## 2015-09-26 MED ORDER — CARVEDILOL 6.25 MG PO TABS
6.2500 mg | ORAL_TABLET | Freq: Two times a day (BID) | ORAL | Status: DC
  Administered 2015-09-27 – 2015-09-29 (×5): 6.25 mg via ORAL
  Filled 2015-09-26 (×8): qty 1

## 2015-09-26 MED ORDER — CEFAZOLIN SODIUM-DEXTROSE 2-3 GM-% IV SOLR
2.0000 g | Freq: Once | INTRAVENOUS | Status: AC
  Administered 2015-09-26: 2 g via INTRAVENOUS
  Filled 2015-09-26: qty 50

## 2015-09-26 MED ORDER — DOCUSATE SODIUM 100 MG PO CAPS
100.0000 mg | ORAL_CAPSULE | Freq: Two times a day (BID) | ORAL | Status: DC
  Administered 2015-09-26: 100 mg via ORAL
  Filled 2015-09-26: qty 1

## 2015-09-26 MED ORDER — MORPHINE SULFATE (PF) 4 MG/ML IV SOLN
4.0000 mg | Freq: Once | INTRAVENOUS | Status: AC
  Administered 2015-09-26: 4 mg via INTRAVENOUS
  Filled 2015-09-26: qty 1

## 2015-09-26 MED ORDER — ACETAMINOPHEN 325 MG PO TABS
650.0000 mg | ORAL_TABLET | Freq: Four times a day (QID) | ORAL | Status: DC | PRN
  Administered 2015-09-27: 650 mg via ORAL
  Filled 2015-09-26: qty 2

## 2015-09-26 MED ORDER — OXYCODONE HCL 5 MG PO TABS
5.0000 mg | ORAL_TABLET | ORAL | Status: DC | PRN
  Administered 2015-09-26: 5 mg via ORAL
  Administered 2015-09-28 (×2): 10 mg via ORAL
  Administered 2015-09-28: 5 mg via ORAL
  Administered 2015-09-29 (×2): 10 mg via ORAL
  Filled 2015-09-26 (×2): qty 2
  Filled 2015-09-26: qty 1
  Filled 2015-09-26 (×2): qty 2
  Filled 2015-09-26: qty 1

## 2015-09-26 MED ORDER — HYDROMORPHONE HCL 1 MG/ML IJ SOLN
0.5000 mg | INTRAMUSCULAR | Status: DC | PRN
  Administered 2015-09-27 (×2): 0.5 mg via INTRAVENOUS
  Administered 2015-09-27: 1 mg via INTRAVENOUS
  Administered 2015-09-28 (×3): 0.5 mg via INTRAVENOUS
  Filled 2015-09-26 (×6): qty 1

## 2015-09-26 MED ORDER — MAGNESIUM HYDROXIDE 400 MG/5ML PO SUSP: 30.0000 mL | Freq: Every day | ORAL | Status: DC | PRN

## 2015-09-26 MED ORDER — BISACODYL 10 MG RE SUPP: 10.0000 mg | Freq: Every day | RECTAL | Status: DC | PRN

## 2015-09-26 MED ORDER — ACETAMINOPHEN 650 MG RE SUPP
650.0000 mg | Freq: Four times a day (QID) | RECTAL | Status: DC | PRN
  Administered 2015-09-27: 650 mg via RECTAL
  Filled 2015-09-26: qty 1

## 2015-09-26 MED ORDER — INSULIN ASPART 100 UNIT/ML ~~LOC~~ SOLN
0.0000 [IU] | Freq: Three times a day (TID) | SUBCUTANEOUS | Status: DC
  Administered 2015-09-27: 1 [IU] via SUBCUTANEOUS
  Administered 2015-09-28 – 2015-09-29 (×4): 2 [IU] via SUBCUTANEOUS
  Filled 2015-09-26 (×2): qty 2
  Filled 2015-09-26: qty 1
  Filled 2015-09-26 (×2): qty 2

## 2015-09-26 MED ORDER — FINASTERIDE 5 MG PO TABS
5.0000 mg | ORAL_TABLET | Freq: Every day | ORAL | Status: DC
  Administered 2015-09-28 – 2015-09-29 (×2): 5 mg via ORAL
  Filled 2015-09-26 (×4): qty 1

## 2015-09-26 MED ORDER — OXYCODONE HCL 5 MG PO TABS: 5.0000 mg | ORAL_TABLET | ORAL | Status: DC | PRN

## 2015-09-26 MED ORDER — HYDROCHLOROTHIAZIDE 25 MG PO TABS
25.0000 mg | ORAL_TABLET | Freq: Every day | ORAL | Status: DC
  Administered 2015-09-28 – 2015-09-29 (×2): 25 mg via ORAL
  Filled 2015-09-26 (×2): qty 1

## 2015-09-26 MED ORDER — SODIUM CHLORIDE 0.9 % IV SOLN
Freq: Once | INTRAVENOUS | Status: AC
  Administered 2015-09-26: 16:00:00 via INTRAVENOUS

## 2015-09-26 MED ORDER — PRAVASTATIN SODIUM 20 MG PO TABS
40.0000 mg | ORAL_TABLET | Freq: Every day | ORAL | Status: DC
  Administered 2015-09-26 – 2015-09-28 (×2): 40 mg via ORAL
  Filled 2015-09-26 (×2): qty 2

## 2015-09-26 MED ORDER — KCL IN DEXTROSE-NACL 20-5-0.9 MEQ/L-%-% IV SOLN
INTRAVENOUS | Status: DC
  Administered 2015-09-26 – 2015-09-27 (×3): via INTRAVENOUS
  Filled 2015-09-26 (×7): qty 1000

## 2015-09-26 MED ORDER — ONDANSETRON HCL 4 MG/2ML IJ SOLN
4.0000 mg | Freq: Once | INTRAMUSCULAR | Status: AC
  Administered 2015-09-26: 4 mg via INTRAVENOUS
  Filled 2015-09-26: qty 2

## 2015-09-26 MED ORDER — ENALAPRIL MALEATE 10 MG PO TABS
10.0000 mg | ORAL_TABLET | Freq: Every day | ORAL | Status: DC
  Administered 2015-09-27 – 2015-09-29 (×3): 10 mg via ORAL
  Filled 2015-09-26 (×3): qty 1

## 2015-09-26 MED ORDER — MIRTAZAPINE 15 MG PO TABS
15.0000 mg | ORAL_TABLET | Freq: Every day | ORAL | Status: DC
  Administered 2015-09-26 – 2015-09-28 (×3): 15 mg via ORAL
  Filled 2015-09-26 (×3): qty 1

## 2015-09-26 MED ORDER — GABAPENTIN 300 MG PO CAPS
300.0000 mg | ORAL_CAPSULE | Freq: Two times a day (BID) | ORAL | Status: DC
  Administered 2015-09-26 – 2015-09-29 (×5): 300 mg via ORAL
  Filled 2015-09-26 (×5): qty 1

## 2015-09-26 NOTE — Consult Note (Signed)
[facility] [place] NAME: Rick [name]    MR#:  [id number]  DATE OF BIRTH:  February 28, 1941  DATE OF ADMISSION:  09/26/2015  PRIMARY CARE PHYSICIAN: Keith Rake, MD   CONSULT REQUESTING/REFERRING PHYSICIAN: Dr. Roland Rack  REASON FOR CONSULT: Pre-op evaluation, HTN, DM  CHIEF COMPLAINT:   Chief Complaint  Patient presents with  . Ankle Injury    HISTORY OF PRESENT ILLNESS:  Rick [name]  is a 75 y.o. male with a known history of hypertension, diabetes, bipolar disorder presents to the hospital after tripping down the stairs and injuring his left ankle. Did not lose consciousness or hit his head. X-ray of the left ankle showed fracture of distal fibula and medial malleolus. Presently patient's pain is well-controlled. He has been compliant with his all his medications. Planning on changing his PCP. Ambulance on his own at baseline.  No chest pain or shortness of breath on ambulation or climbing stairs. Patient had multiple ventral hernia surgeries under general anesthesia without any complications in the past.  PAST MEDICAL HISTORY:   Past Medical History  Diagnosis Date  . Diabetes mellitus without complication ([place])   . Hyperlipidemia   . Hypertension   . BPH (benign prostatic hypertrophy)   . Insomnia   . Bipolar disorder ([place])   . Peripheral neuropathy ([place])   . Skin cancer   . Bladder cancer ([place])   . Gangrenous appendicitis     PAST SURGICAL HISTOIRY:   Past Surgical History  Procedure Laterality Date  . Hernia repair    . Appendectomy    . Cholecystectomy    . Skin cancer excision    . Bladder surgery      SOCIAL HISTORY:   Social History  Substance Use Topics  . Smoking status: Former Smoker -- 10 years    Types: Cigarettes    Start date: 08/14/1994    Quit date: 03/23/2005  . Smokeless tobacco: Former Systems developer    Types: Snuff, Chew    Quit date: 03/23/2010  . Alcohol Use: No    FAMILY HISTORY:    Family History  Problem Relation Age of Onset  . Transient ischemic attack Mother   . Diabetes Father   . Cancer Brother     DRUG ALLERGIES:   Allergies  Allergen Reactions  . Haloperidol     Other reaction(s): Unknown  . Sulfa Antibiotics     Other reaction(s): OTHER    REVIEW OF SYSTEMS:   ROS  CONSTITUTIONAL: No fever, fatigue or weakness.  EYES: No blurred or double vision.  EARS, NOSE, AND THROAT: No tinnitus or ear pain.  RESPIRATORY: No cough, shortness of breath, wheezing or hemoptysis.  CARDIOVASCULAR: No chest pain, orthopnea, edema.  GASTROINTESTINAL: No nausea, vomiting, diarrhea or abdominal pain.  GENITOURINARY: No dysuria, hematuria.  ENDOCRINE: No polyuria, nocturia,  HEMATOLOGY: No anemia, easy bruising or bleeding SKIN: No rash or lesion. MUSCULOSKELETAL: Pain left ankle NEUROLOGIC: No tingling, numbness, weakness.  PSYCHIATRY: No anxiety or depression.   MEDICATIONS AT HOME:   Prior to Admission medications   Medication Sig Start Date End Date Taking? Authorizing Provider  carvedilol (COREG) 6.25 MG tablet Take 1 tablet (6.25 mg total) by mouth 2 (two) times daily with a meal. 08/05/15 08/04/16  Arlis Porta., MD  enalapril (VASOTEC) 10 MG tablet Take 1 tablet (10 mg total) by mouth daily. 08/05/15   Arlis Porta., MD  finasteride (PROSCAR) 5 MG tablet Take 1  tablet (5 mg total) by mouth daily. 08/31/15   Arlis Porta., MD  gabapentin (NEURONTIN) 300 MG capsule Take 1 capsule (300 mg total) by mouth 2 (two) times daily. 08/05/15   Arlis Porta., MD  glimepiride (AMARYL) 4 MG tablet Take 1 tablet (4 mg total) by mouth daily before breakfast. 08/05/15   Arlis Porta., MD  glucose blood (ONE TOUCH ULTRA TEST) test strip CHECK BLOOD SUGAR TWICE DAILY AS DIRECTED. 01/29/15   Historical Provider, MD  hydrochlorothiazide (HYDRODIURIL) 25 MG tablet Take 1 tablet (25 mg total) by mouth daily. 08/05/15   Arlis Porta., MD   lamoTRIgine (LAMICTAL) 100 MG tablet Take by mouth. 05/18/14 05/18/15  Historical Provider, MD  Lancets (ONETOUCH ULTRASOFT) lancets CHECK BLOOD SUGAR TWICE DAILY AS DIRECTED. 02/09/15   Historical Provider, MD  lovastatin (MEVACOR) 40 MG tablet Take 1 tablet (40 mg total) by mouth at bedtime. 08/05/15   Arlis Porta., MD  metFORMIN (GLUCOPHAGE) 500 MG tablet Take 1 tablet (500 mg total) by mouth 2 (two) times daily with a meal. 08/05/15   Arlis Porta., MD  mirtazapine (REMERON) 15 MG tablet TAKE ONE TABLET AT BEDTIME 04/14/15   Historical Provider, MD  traZODone (DESYREL) 100 MG tablet TAKE ONE TABLET AT BEDTIME 09/09/14   Historical Provider, MD      VITAL SIGNS:  Blood pressure 139/73, pulse 84, temperature 97.8 F (36.6 C), temperature source Oral, resp. rate 20, height 5\' 7"  (1.702 m), weight 81.647 kg (180 lb), SpO2 97 %.  PHYSICAL EXAMINATION:  GENERAL:  75 y.o.-year-old patient lying in the bed with no acute distress.  EYES: Pupils equal, round, reactive to light and accommodation. No scleral icterus. Extraocular muscles intact.  HEENT: Head atraumatic, normocephalic. Oropharynx and nasopharynx clear.  NECK:  Supple, no jugular venous distention. No thyroid enlargement, no tenderness.  LUNGS: Normal breath sounds bilaterally, no wheezing, rales,rhonchi or crepitation. No use of accessory muscles of respiration.  CARDIOVASCULAR: S1, S2 normal. No murmurs, rubs, or gallops.  ABDOMEN: Soft, nontender, nondistended. Bowel sounds present. No organomegaly. Ventral hernia. EXTREMITIES: Cast over left ankle NEUROLOGIC: Cranial nerves II through XII are intact. Muscle strength 5/5 in all extremities. Sensation intact. Gait not checked.  PSYCHIATRIC: The patient is alert and oriented x 3.  SKIN: No obvious rash, lesion, or ulcer.   LABORATORY PANEL:   CBC  Recent Labs Lab 09/26/15 1624  WBC 8.5  HGB 15.1  HCT 43.9  PLT 224    ------------------------------------------------------------------------------------------------------------------  Chemistries   Recent Labs Lab 09/26/15 1624  NA 139  K 4.3  CL 106  CO2 22  GLUCOSE 141*  BUN 23*  CREATININE 0.99  CALCIUM 9.4   ------------------------------------------------------------------------------------------------------------------  Cardiac Enzymes No results for input(s): TROPONINI in the last 168 hours. ------------------------------------------------------------------------------------------------------------------  RADIOLOGY:  Dg Ankle Complete Left  09/26/2015  CLINICAL DATA:  Pain following fall from step EXAM: LEFT ANKLE COMPLETE - 3+ VIEW COMPARISON:  None. FINDINGS: Frontal, oblique, and lateral views were obtained. There is avulsion of the medial malleolus. The medial malleolus is displaced slightly laterally with respect to the remainder the tibia. There is an obliquely oriented fracture at the distal fibular diaphysis -metaphysis junction with lateral displacement of the distal fracture fragment with respect to the proximal fragment. There is ankle mortise disruption. There are spurs arising from the inferior and posterior calcaneus. IMPRESSION: Fractures of the distal fibula and medial malleolus with gross ankle mortise disruption. Electronically Signed  By: Lowella Grip III M.D.   On: 09/26/2015 15:32    EKG:   Orders placed or performed during the hospital encounter of 09/26/15  . EKG 12-Lead  . EKG 12-Lead   EKG- NSR. Nothing acute  IMPRESSION AND PLAN:   * Left ankle fracture Patient should be low risk for the surgery. Has good functional status. Has tolerated anesthesia well in the past.  * Diabetes mellitus Plan continue home medications. Ordered sliding scale insulin. Diabetic diet.  * Hypertension Well-controlled. Continue home medications.  * Bipolar disorder Continue medications. Is alert and oriented 3.  *  DVT prophylaxis per orthopedics   All the records are reviewed and case discussed with Consulting provider. Management plans discussed with the patient, family and they are in agreement.  TOTAL TIME TAKING CARE OF THIS PATIENT: 35 minutes.    Hillary Bow R M.D on 09/26/2015 at 5:11 PM  Between 7am to 6pm - Pager - [phone]  After 6pm go to [email or web address] - password [email or web address] [facility]  Office  [phone]  CC: Primary care Physician: Keith Rake, MD     Note: This dictation was prepared with Dragon dictation along with smaller phrase technology. Any transcriptional errors that result from this process are unintentional.

## 2015-09-26 NOTE — ED Provider Notes (Signed)
[facility] Emergency Department Provider Note   ____________________________________________  Time seen: ~1515  I have reviewed the triage vital signs and the nursing notes.   HISTORY  Chief Complaint Ankle Injury   History limited by: Not Limited   HPI ROURKE KASE is a 75 y.o. male who presents to the emergency department today because of left ankle pain. Patient states he stepped off his porch wrong. States it was an inversion type twisting to his ankle. He states he had immediate onset of pain. States it is constantly been painful. He did noted deformity to that ankle. He denies any other injuries.    Past Medical History  Diagnosis Date  . Diabetes mellitus without complication ([place])   . Hyperlipidemia   . Hypertension   . BPH (benign prostatic hypertrophy)   . Insomnia   . Bipolar disorder ([place])   . Peripheral neuropathy ([place])   . Skin cancer   . Bladder cancer ([place])   . Gangrenous appendicitis     Patient Active Problem List   Diagnosis Date Noted  . Malignant neoplasm of urinary bladder ([place]) 08/05/2015  . Benign fibroma of prostate 08/05/2015  . History of alcoholism ([place]) 08/05/2015  . History of migraine headaches 08/05/2015  . Impacted cerumen of left ear 08/05/2015  . Mastoiditis 08/05/2015  . Calculus of kidney 08/05/2015  . Disorder of peripheral nervous system ([place]) 08/05/2015  . Skin lesion 08/05/2015  . Superficial acne vulgaris 08/05/2015  . Back pain, thoracic 08/05/2015  . Diabetes mellitus, type 2 ([place]) 08/05/2015  . Encounter to establish care 08/05/2015  . Chest pain, atypical 06/23/2015  . Left hand pain 05/18/2015  . Need for immunization against influenza 05/11/2015  . Hypertension 05/11/2015  . Hyperlipidemia 05/11/2015  . Ear problem 03/24/2015  . Affective bipolar disorder ([place]) 03/24/2015  . Diabetic peripheral neuropathy associated with type 2 diabetes mellitus ([place]) 03/24/2015  . Cannot sleep  03/24/2015  . Mass of arm 02/10/2015  . History of abdominal hernia 02/10/2015  . Anxiety and depression 02/10/2015  . Anxiety, generalized 05/18/2014  . Chronic prostatitis 06/12/2013  . H/O urinary disorder 03/27/2013  . Herpesviral infection of penis 11/07/2012  . Incomplete bladder emptying 11/07/2012  . Balanoposthitis 10/31/2012  . Benign prostatic hyperplasia with urinary obstruction 10/31/2012  . Difficult or painful urination 10/31/2012  . ED (erectile dysfunction) of organic origin 10/31/2012  . 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 UD (urethral discharge) 10/31/2012    Past Surgical History  Procedure Laterality Date  . Hernia repair    . Appendectomy    . Cholecystectomy    . Skin cancer excision    . Bladder surgery      Current Outpatient Rx  Name  Route  Sig  Dispense  Refill  . carvedilol (COREG) 6.25 MG tablet   Oral   Take 1 tablet (6.25 mg total) by mouth 2 (two) times daily with a meal.   60 tablet   6   . enalapril (VASOTEC) 10 MG tablet   Oral   Take 1 tablet (10 mg total) by mouth daily.   30 tablet   6   . finasteride (PROSCAR) 5 MG tablet   Oral   Take 1 tablet (5 mg total) by mouth daily.   90 tablet   1   . gabapentin (NEURONTIN) 300 MG capsule   Oral   Take 1 capsule (300 mg total) by mouth 2 (two) times daily.   60 capsule  6   . glimepiride (AMARYL) 4 MG tablet   Oral   Take 1 tablet (4 mg total) by mouth daily before breakfast.   30 tablet   6   . glucose blood (ONE TOUCH ULTRA TEST) test strip      CHECK BLOOD SUGAR TWICE DAILY AS DIRECTED.         . hydrochlorothiazide (HYDRODIURIL) 25 MG tablet   Oral   Take 1 tablet (25 mg total) by mouth daily.   30 tablet   6   . EXPIRED: lamoTRIgine (LAMICTAL) 100 MG tablet   Oral   Take by mouth.         . Lancets (ONETOUCH ULTRASOFT) lancets      CHECK BLOOD SUGAR TWICE DAILY AS DIRECTED.         [name] lovastatin (MEVACOR) 40 MG tablet   Oral   Take 1  tablet (40 mg total) by mouth at bedtime.   30 tablet   6   . metFORMIN (GLUCOPHAGE) 500 MG tablet   Oral   Take 1 tablet (500 mg total) by mouth 2 (two) times daily with a meal.   60 tablet   6   . mirtazapine (REMERON) 15 MG tablet      TAKE ONE TABLET AT BEDTIME         . traZODone (DESYREL) 100 MG tablet      TAKE ONE TABLET AT BEDTIME           Allergies Haloperidol and Sulfa antibiotics  Family History  Problem Relation Age of Onset  . Transient ischemic attack Mother   . Diabetes Father   . Cancer Brother     Social History Social History  Substance Use Topics  . Smoking status: Former Smoker -- 10 years    Types: Cigarettes    Start date: 08/14/1994    Quit date: 03/23/2005  . Smokeless tobacco: Former Systems developer    Types: Snuff, Chew    Quit date: 03/23/2010  . Alcohol Use: No    Review of Systems  Constitutional: Negative for fever. Cardiovascular: Negative for chest pain. Respiratory: Negative for shortness of breath. Gastrointestinal: Negative for abdominal pain, vomiting and diarrhea. Neurological: Negative for headaches, focal weakness or numbness.   10-point ROS otherwise negative.  ____________________________________________   PHYSICAL EXAM:  VITAL SIGNS: ED Triage Vitals  Enc Vitals Group     BP 09/26/15 1427 139/73 mmHg     Pulse Rate 09/26/15 1427 84     Resp 09/26/15 1427 20     Temp 09/26/15 1427 97.8 F (36.6 C)     Temp Source 09/26/15 1427 Oral     SpO2 09/26/15 1427 97 %     Weight 09/26/15 1427 180 lb (81.647 kg)     Height 09/26/15 1427 5\' 7"  (1.702 m)     Head Cir --      Peak Flow --      Pain Score 09/26/15 1428 9   Constitutional: Alert and oriented. Well appearing and in no distress. Eyes: Conjunctivae are normal. PERRL. Normal extraocular movements. ENT   Head: Normocephalic and atraumatic.   Nose: No congestion/rhinnorhea.   Mouth/Throat: Mucous membranes are moist.   Neck: No  stridor. Hematological/Lymphatic/Immunilogical: No cervical lymphadenopathy. Cardiovascular: Normal rate, regular rhythm.  No murmurs, rubs, or gallops. Respiratory: Normal respiratory effort without tachypnea nor retractions. Breath sounds are clear and equal bilaterally. No wheezes/rales/rhonchi. Gastrointestinal: Soft and nontender. No distention. There is no CVA tenderness. Genitourinary: Deferred Musculoskeletal:  Left ankle with deformity. Dorsalis pedis 2+. Good cap refill. Neurologic:  Normal speech and language. No gross focal neurologic deficits are appreciated.  Skin:  Skin is warm, dry and intact. No rash noted. Psychiatric: Mood and affect are normal. Speech and behavior are normal. Patient exhibits appropriate insight and judgment.  ____________________________________________    LABS (pertinent positives/negatives)  Labs Reviewed  BASIC METABOLIC PANEL - Abnormal; Notable for the following:    Glucose, Bld 141 (*)    BUN 23 (*)    All other components within normal limits  CBC WITH DIFFERENTIAL/PLATELET     ____________________________________________   EKG  I, Nance Pear, attending physician, personally viewed and interpreted this EKG  EKG Time: 1743 Rate: 74 Rhythm: normal sinus rhythm Axis: normal Intervals: qtc 421 QRS: narrow ST changes: no st elevation Impression: abnormal ekg  ____________________________________________    RADIOLOGY  Left ankle IMPRESSION: Fractures of the distal fibula and medial malleolus with gross ankle mortise disruption.  I, [name], personally viewed and evaluated these images (plain radiographs) as part of my medical decision making. ____________________________________________   PROCEDURES  Procedure(s) performed: None  Critical Care performed: No  ____________________________________________   INITIAL IMPRESSION / ASSESSMENT AND PLAN / ED COURSE  Pertinent labs & imaging results that were  available during my care of the patient were reviewed by me and considered in my medical decision making (see chart for details).  Patient presents to the emergency department today after left ankle pain after a fall. X-ray does show ankle fracture. This was splinted in the emergency department. The patient will be admitted by orthopedics.  ____________________________________________   FINAL CLINICAL IMPRESSION(S) / ED DIAGNOSES  Final diagnoses:  Trimalleolar fracture, left, closed, initial encounter     Nance Pear, MD 09/26/15 1936

## 2015-09-26 NOTE — ED Notes (Signed)
Pt arrives via [facility], per EMS pt fell down off the steps and twisted his ankle. Pt presents with a splint to his L ankle, but with obvious deformity. Pt alert and oriented at this time. Per EMS police were called to the patient's house due to patient becoming combative with EMS.

## 2015-09-26 NOTE — ED Notes (Signed)
Pt taken to XR ay at this time.

## 2015-09-26 NOTE — ED Notes (Signed)
Dr. Roland Rack at bedside at this time. Dr Archie Balboa at bedside as well to re-do splint on L ankle.

## 2015-09-26 NOTE — Progress Notes (Signed)
PT IS A JEHOVAH'S WITNESS--NO BLOOD PRODUCTS

## 2015-09-26 NOTE — H&P (Signed)
Subjective:  Chief complaint:  Left ankle injury.  The patient is a 75 y.o. male who sustained an injury to the left ankle earlier today. Apparently he stumbled while stepping off of his porch, resulting in an injury to the ankle. He was unable to get up. He was brought to the emergency room where x-rays demonstrated a closed but displaced trimalleolar fracture dislocation of his left ankle. Several attempts at closed reduction were made by the ER physician with unsatisfactory postreduction films. The patient denies any associated injury or loss of consciousness associated with the injury, and denies any light-headedness, loss of consciousness, chest pain, or shortness of breath which might have contributed to the injury. The patient denies any prior problems with his ankles, and denies any new numbness, tingling, or paresthesias to his foot, although he does have a history of peripheral neuropathy.  Patient Active Problem List   Diagnosis Date Noted  . Malignant neoplasm of urinary bladder ([place]) 08/05/2015  . Benign fibroma of prostate 08/05/2015  . History of alcoholism ([place]) 08/05/2015  . History of migraine headaches 08/05/2015  . Impacted cerumen of left ear 08/05/2015  . Mastoiditis 08/05/2015  . Calculus of kidney 08/05/2015  . Disorder of peripheral nervous system ([place]) 08/05/2015  . Skin lesion 08/05/2015  . Superficial acne vulgaris 08/05/2015  . Back pain, thoracic 08/05/2015  . Diabetes mellitus, type 2 ([place]) 08/05/2015  . Encounter to establish care 08/05/2015  . Chest pain, atypical 06/23/2015  . Left hand pain 05/18/2015  . Need for immunization against influenza 05/11/2015  . Hypertension 05/11/2015  . Hyperlipidemia 05/11/2015  . Ear problem 03/24/2015  . Affective bipolar disorder ([place]) 03/24/2015  . Diabetic peripheral neuropathy associated with type 2 diabetes mellitus ([place]) 03/24/2015  . Cannot sleep 03/24/2015  . Mass of arm 02/10/2015  . History of abdominal  hernia 02/10/2015  . Anxiety and depression 02/10/2015  . Anxiety, generalized 05/18/2014  . Chronic prostatitis 06/12/2013  . H/O urinary disorder 03/27/2013  . Herpesviral infection of penis 11/07/2012  . Incomplete bladder emptying 11/07/2012  . Balanoposthitis 10/31/2012  . Benign prostatic hyperplasia with urinary obstruction 10/31/2012  . Difficult or painful urination 10/31/2012  . ED (erectile dysfunction) of organic origin 10/31/2012  . 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 UD (urethral discharge) 10/31/2012   Past Medical History  Diagnosis Date  . Diabetes mellitus without complication ([place])   . Hyperlipidemia   . Hypertension   . BPH (benign prostatic hypertrophy)   . Insomnia   . Bipolar disorder ([place])   . Peripheral neuropathy ([place])   . Skin cancer   . Bladder cancer ([place])   . Gangrenous appendicitis     Past Surgical History  Procedure Laterality Date  . Hernia repair    . Appendectomy    . Cholecystectomy    . Skin cancer excision    . Bladder surgery       (Not in a hospital admission) Allergies  Allergen Reactions  . Haloperidol     Other reaction(s): Unknown  . Sulfa Antibiotics     Other reaction(s): OTHER    Social History  Substance Use Topics  . Smoking status: Former Smoker -- 10 years    Types: Cigarettes    Start date: 08/14/1994    Quit date: 03/23/2005  . Smokeless tobacco: Former Systems developer    Types: Snuff, Chew    Quit date: 03/23/2010  . Alcohol Use: No    Family History  Problem Relation Age of  Onset  . Transient ischemic attack Mother   . Diabetes Father   . Cancer Brother      Review of Systems: As noted above. The patient denies any chest pain, shortness of breath, nausea, vomiting, diarrhea, constipation, belly pain, blood in his/her stool, or burning with urination.  Objective: Temp:  [97.8 F (36.6 C)] 97.8 F (36.6 C) (02/12 1427) Pulse Rate:  [81-84] 81 (02/12 1736) Resp:  [18-20] 18 (02/12 1736) BP:  (129-139)/(73-84) 129/84 mmHg (02/12 1736) SpO2:  [96 %-97 %] 96 % (02/12 1736) Weight:  [81.647 kg (180 lb)] 81.647 kg (180 lb) (02/12 1427)  Physical Exam: General:  Alert, no acute distress Psychiatric:  Patient is competent for consent with normal mood and affect Cardiovascular:  RRR  Respiratory:  Clear to auscultation. No wheezing. Non-labored breathing GI:  Abdomen is soft and non-tender Skin:  No lesions in the area of chief complaint Neurologic:  Sensation intact distally Lymphatic:  No axillary or cervical lymphadenopathy  Orthopedic Exam:  Orthopedic examination is limited to the left ankle and foot. The ankle demonstrates an obvious deformity with moderate swelling and early ecchymosis. The overlying skin otherwise is intact as there is no evidence for abrasions, lacerations, blisters, or other puncture sites. He has pain with any attempted active or passive motion of the ankle, but is able to gently flex and extend his toes. Sensation is present to light touch in all distributions, although decreased due to his history of peripheral neuropathy. He has good capillary refill to all digits.  Imaging Review: Recent x-rays of the left ankle are available for review.  These films demonstrate a displaced trimalleolar fracture dislocation of the left ankle. No significant degenerative changes are noted. There is diffuse osteopenia but no lytic lesions identified.  Assessment: Trimalleolar fracture dislocation left ankle.  Plan: The treatment options have been discussed in detail with the patient. I have recommended that he undergo surgical stabilization of this ankle fracture. The risks (including bleeding, infection, nerve and/or blood vessel injury, persistent or recurrent pain, loosening or failure of the components, malunion and/or nonunion, need for further surgery, blood clots, strokes, heart attacks or arrhythmias, pneumonia, etc.) and benefits of this procedure are reviewed. The  patient states his understanding and agrees to proceed. A formal written consent will be obtained by the nursing staff prior to surgery.  While in the emergency room, his ankle fracture is reduced and stabilized with a posterior splint with sugar tong supplement by myself and under my supervision. Postreduction films demonstrate near anatomic alignment of the fractures and ankle mortise. The patient will be admitted at this time for elevation and icing of the ankle, as well as for medical clearance prior to proceeding with his surgery sometime tomorrow afternoon.

## 2015-09-26 NOTE — ED Notes (Signed)
Report given to Rebecca, RN

## 2015-09-27 ENCOUNTER — Observation Stay: Payer: Medicare Other | Admitting: Anesthesiology

## 2015-09-27 ENCOUNTER — Encounter: Payer: Self-pay | Admitting: *Deleted

## 2015-09-27 ENCOUNTER — Encounter
Admission: EM | Disposition: A | Discharge status: Home / Self Care | Payer: Self-pay | Source: Home / Self Care | Attending: Emergency Medicine

## 2015-09-27 ENCOUNTER — Observation Stay: Payer: Medicare Other

## 2015-09-27 HISTORY — PX: ORIF ANKLE FRACTURE: SHX5408

## 2015-09-27 LAB — BASIC METABOLIC PANEL
ANION GAP: 5 (ref 5–15)
BUN: 12 mg/dL (ref 6–20)
CALCIUM: 8.3 mg/dL — AB (ref 8.9–10.3)
CO2: 25 mmol/L (ref 22–32)
CREATININE: 1 mg/dL (ref 0.61–1.24)
Chloride: 111 mmol/L (ref 101–111)
GFR calc Af Amer: >60 mL/min (ref >60)
GLUCOSE: 155 mg/dL — AB (ref 65–99)
Potassium: 4.9 mmol/L (ref 3.5–5.1)
Sodium: 141 mmol/L (ref 135–145)

## 2015-09-27 LAB — GLUCOSE, CAPILLARY
GLUCOSE-CAPILLARY: 144 mg/dL — AB (ref 65–99)
GLUCOSE-CAPILLARY: 126 mg/dL — AB (ref 65–99)
Glucose-Capillary: 120 mg/dL — ABNORMAL HIGH (ref 65–99)
Glucose-Capillary: 142 mg/dL — ABNORMAL HIGH (ref 65–99)

## 2015-09-27 SURGERY — OPEN REDUCTION INTERNAL FIXATION (ORIF) ANKLE FRACTURE
Anesthesia: General | Laterality: Left

## 2015-09-27 MED ORDER — ENOXAPARIN SODIUM 40 MG/0.4ML ~~LOC~~ SOLN
40.0000 mg | SUBCUTANEOUS | Status: DC
  Administered 2015-09-28 – 2015-09-29 (×2): 40 mg via SUBCUTANEOUS
  Filled 2015-09-27 (×2): qty 0.4

## 2015-09-27 MED ORDER — FENTANYL CITRATE (PF) 100 MCG/2ML IJ SOLN
INTRAMUSCULAR | Status: DC | PRN
  Administered 2015-09-27 (×4): 50 ug via INTRAVENOUS

## 2015-09-27 MED ORDER — DIPHENHYDRAMINE HCL 12.5 MG/5ML PO ELIX: 12.5000 mg | ORAL_SOLUTION | ORAL | Status: DC | PRN

## 2015-09-27 MED ORDER — ONDANSETRON HCL 4 MG PO TABS: 4.0000 mg | ORAL_TABLET | Freq: Four times a day (QID) | ORAL | Status: DC | PRN

## 2015-09-27 MED ORDER — FENTANYL CITRATE (PF) 100 MCG/2ML IJ SOLN
INTRAMUSCULAR | Status: AC
  Administered 2015-09-27: 25 ug via INTRAVENOUS
  Filled 2015-09-27: qty 2

## 2015-09-27 MED ORDER — ACETAMINOPHEN 650 MG RE SUPP: 650.0000 mg | Freq: Four times a day (QID) | RECTAL | Status: DC | PRN

## 2015-09-27 MED ORDER — MIDAZOLAM HCL 2 MG/2ML IJ SOLN
INTRAMUSCULAR | Status: DC | PRN
  Administered 2015-09-27 (×2): 1 mg via INTRAVENOUS
  Administered 2015-09-27: 2 mg via INTRAVENOUS

## 2015-09-27 MED ORDER — ESMOLOL HCL 100 MG/10ML IV SOLN
INTRAVENOUS | Status: DC | PRN
  Administered 2015-09-27: 20 mg via INTRAVENOUS
  Administered 2015-09-27: 50 mg via INTRAVENOUS

## 2015-09-27 MED ORDER — SODIUM CHLORIDE 0.9 % IV SOLN
10000.0000 ug | INTRAVENOUS | Status: DC | PRN
  Administered 2015-09-27: 10 ug/min via INTRAVENOUS

## 2015-09-27 MED ORDER — SODIUM CHLORIDE 0.9 % IV SOLN
INTRAVENOUS | Status: DC
  Administered 2015-09-27: 15:00:00 via INTRAVENOUS

## 2015-09-27 MED ORDER — ONDANSETRON HCL 4 MG/2ML IJ SOLN: 4.0000 mg | Freq: Four times a day (QID) | INTRAMUSCULAR | Status: DC | PRN

## 2015-09-27 MED ORDER — FLEET ENEMA 7-19 GM/118ML RE ENEM: 1.0000 | ENEMA | Freq: Once | RECTAL | Status: DC | PRN

## 2015-09-27 MED ORDER — CEFAZOLIN SODIUM-DEXTROSE 2-3 GM-% IV SOLR
INTRAVENOUS | Status: AC
  Administered 2015-09-27: 2 g via INTRAVENOUS
  Filled 2015-09-27: qty 50

## 2015-09-27 MED ORDER — METOCLOPRAMIDE HCL 5 MG/ML IJ SOLN: 5.0000 mg | Freq: Three times a day (TID) | INTRAMUSCULAR | Status: DC | PRN

## 2015-09-27 MED ORDER — CEFAZOLIN SODIUM-DEXTROSE 2-3 GM-% IV SOLR
2.0000 g | Freq: Four times a day (QID) | INTRAVENOUS | Status: AC
  Administered 2015-09-27 – 2015-09-28 (×3): 2 g via INTRAVENOUS
  Filled 2015-09-27 (×3): qty 50

## 2015-09-27 MED ORDER — PHENYLEPHRINE HCL 10 MG/ML IJ SOLN
INTRAMUSCULAR | Status: DC | PRN
  Administered 2015-09-27 (×2): 100 ug via INTRAVENOUS
  Administered 2015-09-27: 200 ug via INTRAVENOUS
  Administered 2015-09-27 (×4): 100 ug via INTRAVENOUS

## 2015-09-27 MED ORDER — ACETAMINOPHEN 325 MG PO TABS: 650.0000 mg | ORAL_TABLET | Freq: Four times a day (QID) | ORAL | Status: DC | PRN

## 2015-09-27 MED ORDER — MAGNESIUM HYDROXIDE 400 MG/5ML PO SUSP
30.0000 mL | Freq: Every day | ORAL | Status: DC | PRN
  Administered 2015-09-29: 30 mL via ORAL
  Filled 2015-09-27: qty 30

## 2015-09-27 MED ORDER — METOPROLOL TARTRATE 1 MG/ML IV SOLN
INTRAVENOUS | Status: DC | PRN
  Administered 2015-09-27: 1 mg via INTRAVENOUS
  Administered 2015-09-27 (×2): 2 mg via INTRAVENOUS

## 2015-09-27 MED ORDER — ONDANSETRON HCL 4 MG/2ML IJ SOLN
INTRAMUSCULAR | Status: DC | PRN
  Administered 2015-09-27: 4 mg via INTRAVENOUS

## 2015-09-27 MED ORDER — ONDANSETRON HCL 4 MG/2ML IJ SOLN: 4.0000 mg | Freq: Once | INTRAMUSCULAR | Status: DC | PRN

## 2015-09-27 MED ORDER — FENTANYL CITRATE (PF) 100 MCG/2ML IJ SOLN
25.0000 ug | INTRAMUSCULAR | Status: DC | PRN
  Administered 2015-09-27 (×4): 25 ug via INTRAVENOUS

## 2015-09-27 MED ORDER — METOCLOPRAMIDE HCL 5 MG PO TABS: 5.0000 mg | ORAL_TABLET | Freq: Three times a day (TID) | ORAL | Status: DC | PRN

## 2015-09-27 MED ORDER — NEOMYCIN-POLYMYXIN B GU 40-200000 IR SOLN
Status: DC | PRN
  Administered 2015-09-27: 4 mL

## 2015-09-27 MED ORDER — BISACODYL 10 MG RE SUPP
10.0000 mg | Freq: Every day | RECTAL | Status: DC | PRN
  Filled 2015-09-27: qty 1

## 2015-09-27 MED ORDER — FENTANYL CITRATE (PF) 100 MCG/2ML IJ SOLN: 25.0000 ug | INTRAMUSCULAR | Status: DC | PRN

## 2015-09-27 MED ORDER — DILTIAZEM HCL 25 MG/5ML IV SOLN
INTRAVENOUS | Status: AC
  Filled 2015-09-27: qty 5

## 2015-09-27 MED ORDER — DOCUSATE SODIUM 100 MG PO CAPS
100.0000 mg | ORAL_CAPSULE | Freq: Two times a day (BID) | ORAL | Status: DC
Start: 2015-09-27 — End: 2015-09-29
  Administered 2015-09-27 – 2015-09-29 (×4): 100 mg via ORAL
  Filled 2015-09-27 (×4): qty 1

## 2015-09-27 SURGICAL SUPPLY — 62 items
BANDAGE ACE 4X5 VEL STRL LF (GAUZE/BANDAGES/DRESSINGS) ×4 IMPLANT
BANDAGE ACE 6X5 VEL STRL LF (GAUZE/BANDAGES/DRESSINGS) ×3 IMPLANT
BIT DRILL 2.5X2.75 QC CALB (BIT) ×2 IMPLANT
BIT DRILL 3.5X5.5 QC CALB (BIT) ×2 IMPLANT
BIT DRILL CALIBRATED 2.7 (BIT) ×1 IMPLANT
BIT DRILL CALIBRATED 2.7MM (BIT) ×1
BLADE SURG SZ10 CARB STEEL (BLADE) ×6 IMPLANT
BNDG COHESIVE 4X5 TAN STRL (GAUZE/BANDAGES/DRESSINGS) ×1 IMPLANT
BNDG ESMARK 6X12 TAN STRL LF (GAUZE/BANDAGES/DRESSINGS) ×3 IMPLANT
BNDG PLASTER FAST 4X5 WHT LF (CAST SUPPLIES) ×12 IMPLANT
BNDG PLSTR 5X4 FST ST WHT LF (CAST SUPPLIES) ×4
CANISTER SUCT 1200ML W/VALVE (MISCELLANEOUS) ×3 IMPLANT
CHLORAPREP W/TINT 26ML (MISCELLANEOUS) ×4 IMPLANT
DRAPE C-ARM XRAY 36X54 (DRAPES) ×3 IMPLANT
DRAPE C-ARMOR (DRAPES) ×3 IMPLANT
DRAPE INCISE IOBAN 66X45 STRL (DRAPES) ×3 IMPLANT
DRAPE U-SHAPE 47X51 STRL (DRAPES) ×3 IMPLANT
DRILL SLEEVE 2.7 DIST TIB (TRAUMA) ×2
ELECT CAUTERY BLADE 6.4 (BLADE) ×3 IMPLANT
ELECT REM PT RETURN 9FT ADLT (ELECTROSURGICAL) ×3
ELECTRODE REM PT RTRN 9FT ADLT (ELECTROSURGICAL) ×1 IMPLANT
GAUZE PETRO XEROFOAM 1X8 (MISCELLANEOUS) ×3 IMPLANT
GAUZE SPONGE 4X4 12PLY STRL (GAUZE/BANDAGES/DRESSINGS) ×3 IMPLANT
GLOVE BIO SURGEON STRL SZ8 (GLOVE) ×6 IMPLANT
GLOVE INDICATOR 8.0 STRL GRN (GLOVE) ×3 IMPLANT
GOWN STRL REUS W/ TWL LRG LVL3 (GOWN DISPOSABLE) ×1 IMPLANT
GOWN STRL REUS W/ TWL XL LVL3 (GOWN DISPOSABLE) ×1 IMPLANT
GOWN STRL REUS W/TWL LRG LVL3 (GOWN DISPOSABLE) ×3
GOWN STRL REUS W/TWL XL LVL3 (GOWN DISPOSABLE) ×3
HEMOVAC 400ML (MISCELLANEOUS)
K-WIRE ACE 1.6X6 (WIRE) ×6
KIT DRAIN HEMOVAC JP 7FR 400ML (MISCELLANEOUS) ×1 IMPLANT
KWIRE ACE 1.6X6 (WIRE) IMPLANT
LABEL OR SOLS (LABEL) ×3 IMPLANT
NS IRRIG 1000ML POUR BTL (IV SOLUTION) ×3 IMPLANT
PACK EXTREMITY ARMC (MISCELLANEOUS) ×3 IMPLANT
PAD ABD DERMACEA PRESS 5X9 (GAUZE/BANDAGES/DRESSINGS) ×6 IMPLANT
PAD CAST CTTN 4X4 STRL (SOFTGOODS) ×2 IMPLANT
PAD PREP 24X41 OB/GYN DISP (PERSONAL CARE ITEMS) ×1 IMPLANT
PADDING CAST COTTON 4X4 STRL (SOFTGOODS) ×6
PLATE LOCK 7H 92 BILAT FIB (Plate) ×2 IMPLANT
SCREW ACE CAN 4.0 16M (Screw) ×2 IMPLANT
SCREW ACE CAN 4.0 34M (Screw) ×2 IMPLANT
SCREW CORTICAL 3.5MM 18MM (Screw) ×2 IMPLANT
SCREW CORTICAL 3.5MM 22MM (Screw) ×2 IMPLANT
SCREW LOCK CORT STAR 3.5X10 (Screw) ×2 IMPLANT
SCREW LOCK CORT STAR 3.5X12 (Screw) ×2 IMPLANT
SCREW LOCK CORT STAR 3.5X16 (Screw) ×2 IMPLANT
SCREW LOW PROFILE 12MMX3.5MM (Screw) ×2 IMPLANT
SCREW NON LOCKING LP 3.5 14MM (Screw) ×2 IMPLANT
SCREW NON LOCKING LP 3.5 16MM (Screw) ×2 IMPLANT
SLEEVE DRILL 2.7 DIST TIB (TRAUMA) IMPLANT
SPONGE LAP 18X18 5 PK (GAUZE/BANDAGES/DRESSINGS) ×3 IMPLANT
STAPLER SKIN PROX 35W (STAPLE) ×3 IMPLANT
STOCKINETTE M/LG 89821 (MISCELLANEOUS) ×3 IMPLANT
STOCKINETTE STRL 6IN 960660 (GAUZE/BANDAGES/DRESSINGS) ×1 IMPLANT
STRAP SAFETY BODY (MISCELLANEOUS) ×1 IMPLANT
SUT PROLENE 4 0 PS 2 18 (SUTURE) ×3 IMPLANT
SUT VIC AB 0 CT1 36 (SUTURE) ×3 IMPLANT
SUT VIC AB 2-0 SH 27 (SUTURE) ×6
SUT VIC AB 2-0 SH 27XBRD (SUTURE) ×2 IMPLANT
SYRINGE 10CC LL (SYRINGE) ×3 IMPLANT

## 2015-09-27 NOTE — Op Note (Signed)
09/26/2015 - 09/27/2015  5:56 PM  Patient:   Rick [name]  Pre-Op Diagnosis:   Closed trimalleolar fracture dislocation, left ankle.  Post-Op Diagnosis:   Same.  Procedure:   Open reduction and internal fixation of medial and lateral malleolar fractures, left ankle.  Surgeon:   Pascal Lux, MD  Assistant:   None  Anesthesia:   General LMA  Findings:   As above.  Complications:   None from orthopedic procedural standpoint, but patient did go into new onset atrial flutter during the case.  EBL:   20 cc  Fluids:   1000 cc crystalloid  UOP:   None  TT:   81 min at 250 mmHg  Drains:   None  Closure:   Staples  Implants:   Biomet 7-hole composite locking plate and screws  Brief Clinical Note:   The patient is a 75 year old male who sustained the above-noted injury yesterday afternoon when he stumbled stepping down off of his porch. He was brought to the emergency room where x-rays demonstrated the above-noted injury. The fracture was reduced and temporary stabilized with a posterior splint. He has been cleared medically and presents at this time for definitive management of his injury.  Procedure:   The patient was brought into the operating room. After adequate general laryngeal mask anesthesia was obtained, the left foot and lower leg were prepped with ChloraPrep solution and draped sterilely. Preoperative antibiotics were administered. A timeout was performed to verify the appropriate surgical site before the limb was exsanguinated with an Esmarch and the calf tourniquet inflated to 250 mmHg. Laterally, an 8-10 cm incision was made over the lateral aspect of the distal fibula. The incision was carried down through the subcutaneous tissues to expose the fracture site. The fracture hematoma was debrided before the fracture was reduced and temporarily secured using a bone clamp. Two lag screws were placed in an anterior to posterior direction perpendicular to the fracture. A  7-hole Biomet composite locking plate was applied over the lateral aspect of the distal fibula and gently contoured to optimize its fit. After verifying its position fluoroscopically, it was secured using a 3.5 mm nonlocking cortical screw proximal to the fracture. Again the plate's position was adjusted slightly based on AP and lateral projections before it was secured using additional bicortical screws proximally and multiple locking screws distally. The adequacy of fracture reduction and hardware position was verified fluoroscopically in AP and lateral projections and found to be excellent.   Attention was directed to the medial side. An approximately 3-4 cm longitudinal incision was made over the anterior and distal portions of the medial malleolus. This incision also was carried down through the subcutaneous tissues to expose the fracture site. Care was taken to identify and protect the saphenous nerve and vein. The fracture hematoma again was removed before the fracture was reduced. Because only the anterior portion of the medial malleolus was fractured, a single guidewire was placed obliquely across the fracture from distal to proximal and from anterior to posterior into the distal tibial metaphysis. After verifying its position fluoroscopically, the guidewire was over-reamed and replaced with a 36 mm partially threaded 4.0 cancellous screw in lag fashion. Again the adequacy of fracture reduction, hardware position, and mortise restoration was verified in AP, lateral, and oblique projections and found to be excellent. However, on one projection, it was questionable as to whether the screw entered the joint. Therefore, it was replaced with a shorter (16 mm) partially threaded 4.0 cancellous screw.  Each wound was copiously irrigated with sterile saline solution. Laterally, the subcutaneous tissues were closed in two layers using 2-0 Vicryl interrupted sutures before the skin was closed using staples.  Medially, the subcutaneous tissues were closed using 2-0 Vicryl interrupted sutures before the skin was closed using staples. A total of 20 cc of half percent plain Sensorcaine was injected into both wounds to help with postoperative analgesia. Sterile bulky dressings were applied to the wounds before the patient was placed into a posterior splint with a sugar tong supplement, maintaining the ankle in neutral dorsiflexion. The patient was then awakened and returned to the recovery room in satisfactory condition after tolerating the procedure well.  As noted above, the patient did develop new-onset atrial flutter during the course of the operation. This rate was controlled with a calcium channel blocker by the anesthesiologist. Given this event, it was felt best to proceed with an EKG and subsequent cardiology workup in the recovery room, which has been arranged.

## 2015-09-27 NOTE — Care Management Note (Signed)
Case Management Note  Patient Details  Name: Rick [name] MRN: [id number] Date of Birth: 1941/03/26  Subjective/Objective:       [name] is in surgery and no family is available. Left voice message on phone of his sister in [place] [phone] requesting a call back. Unable to provide [name] with a Medicare MOON notice at this time. .              Action/Plan:   Expected Discharge Date:                  Expected Discharge Plan:     In-House Referral:     Discharge planning Services     Post Acute Care Choice:    Choice offered to:     DME Arranged:    DME Agency:     HH Arranged:    [place]manda Agency:     Status of Service:     Medicare Important Message Given:    Date Medicare IM Given:    Medicare IM give by:    Date Additional Medicare IM Given:    Additional Medicare Important Message give by:     If discussed at [place] of Stay Meetings, dates discussed:    Additional Comments:  Scotlyn Mccranie A, RN 09/27/2015, 4:29 PM

## 2015-09-27 NOTE — NC FL2 (Signed)
[place] LEVEL OF CARE SCREENING TOOL     IDENTIFICATION  Patient Name: Rick [name] Birthdate: October 23, 1940 Sex: male Admission Date (Current Location): 09/26/2015  [place] and [place] Number:  Engineering geologist and Address:  [facility], [address], [place], [place] [address]      Provider Number: [id number]  Attending Physician Name and Address:  Corky Mull, MD  Relative Name and Phone Number:       Current Level of Care: Hospital Recommended Level of Care: [place] Prior Approval Number:    Date Approved/Denied:   PASRR Number:  ( [id number] )  Discharge Plan: SNF    Current Diagnoses: Patient Active Problem List   Diagnosis Date Noted  . Ankle fracture, left 09/26/2015  . Malignant neoplasm of urinary bladder ([place]) 08/05/2015  . Benign fibroma of prostate 08/05/2015  . History of alcoholism ([place]) 08/05/2015  . History of migraine headaches 08/05/2015  . Impacted cerumen of left ear 08/05/2015  . Mastoiditis 08/05/2015  . Calculus of kidney 08/05/2015  . Disorder of peripheral nervous system ([place]) 08/05/2015  . Skin lesion 08/05/2015  . Superficial acne vulgaris 08/05/2015  . Back pain, thoracic 08/05/2015  . Diabetes mellitus, type 2 ([place]) 08/05/2015  . Encounter to establish care 08/05/2015  . Chest pain, atypical 06/23/2015  . Left hand pain 05/18/2015  . Need for immunization against influenza 05/11/2015  . Hypertension 05/11/2015  . Hyperlipidemia 05/11/2015  . Ear problem 03/24/2015  . Affective bipolar disorder ([place]) 03/24/2015  . Diabetic peripheral neuropathy associated with type 2 diabetes mellitus ([place]) 03/24/2015  . Cannot sleep 03/24/2015  . Mass of arm 02/10/2015  . History of abdominal hernia 02/10/2015  . Anxiety and depression 02/10/2015  . Anxiety, generalized 05/18/2014  . Chronic prostatitis 06/12/2013  . H/O urinary disorder 03/27/2013  . Herpesviral infection of  penis 11/07/2012  . Incomplete bladder emptying 11/07/2012  . Balanoposthitis 10/31/2012  . Benign prostatic hyperplasia with urinary obstruction 10/31/2012  . Difficult or painful urination 10/31/2012  . ED (erectile dysfunction) of organic origin 10/31/2012  . Malignant neoplasm of ureter ([place]) 10/31/2012  . UD (urethral discharge) 10/31/2012    Orientation RESPIRATION BLADDER Height & Weight     Self, Time, Situation  Normal Continent Weight: 192 lb 3.2 oz (87.181 kg) Height:  5\' 7"  (170.2 cm)  BEHAVIORAL SYMPTOMS/MOOD NEUROLOGICAL BOWEL NUTRITION STATUS   (none )  (none ) Continent Diet (NPO for surgery )  AMBULATORY STATUS COMMUNICATION OF NEEDS Skin   Extensive Assist Verbally Surgical wounds (Incision: Left Ankle )                       Personal Care Assistance Level of Assistance  Bathing, Feeding, Dressing Bathing Assistance: Limited assistance Feeding assistance: Independent Dressing Assistance: Limited assistance     Functional Limitations Info  Sight, Hearing, Speech Sight Info: Adequate Hearing Info: Adequate Speech Info: Adequate    SPECIAL CARE FACTORS FREQUENCY  PT (By licensed PT), OT (By licensed OT)     PT Frequency:  (5) OT Frequency:  (5)            Contractures      Additional Factors Info  Code Status, Allergies Code Status Info:  (Full Code. ) Allergies Info:  (Haloperidol, Sulfa Antibiotics)           Current Medications (09/27/2015):  This is the current hospital active medication list Current Facility-Administered Medications  Medication  Dose Route Frequency Provider Last Rate Last Dose  . 0.9 %  sodium chloride infusion   Intravenous Continuous Alvin Critchley, MD 50 mL/hr at 09/27/15 1507    . [MAR Hold] acetaminophen (TYLENOL) tablet 650 mg  650 mg Oral Q6H PRN Corky Mull, MD   650 mg at 09/27/15 0827   Or  . [MAR Hold] acetaminophen (TYLENOL) suppository 650 mg  650 mg Rectal Q6H PRN Corky Mull, MD   650 mg at 09/27/15  1321  . [MAR Hold] bisacodyl (DULCOLAX) suppository 10 mg  10 mg Rectal Daily PRN Corky Mull, MD      . Doug Sou Hold] carvedilol (COREG) tablet 6.25 mg  6.25 mg Oral BID WC Hillary Bow, MD   6.25 mg at 09/27/15 0801  . dextrose 5 % and 0.9 % NaCl with KCl 20 mEq/L infusion   Intravenous Continuous Corky Mull, MD 75 mL/hr at 09/27/15 [phone]    . diltiazem (CARDIZEM) 25 MG/5ML injection           . [MAR Hold] docusate sodium (COLACE) capsule 100 mg  100 mg Oral BID Corky Mull, MD   100 mg at 09/26/15 2259  . [MAR Hold] enalapril (VASOTEC) tablet 10 mg  10 mg Oral Daily Hillary Bow, MD   10 mg at 09/27/15 0827  . [MAR Hold] finasteride (PROSCAR) tablet 5 mg  5 mg Oral Daily Hillary Bow, MD   5 mg at 09/27/15 0829  . [MAR Hold] gabapentin (NEURONTIN) capsule 300 mg  300 mg Oral BID Hillary Bow, MD   300 mg at 09/26/15 2258  . [MAR Hold] hydrochlorothiazide (HYDRODIURIL) tablet 25 mg  25 mg Oral Daily Hillary Bow, MD   25 mg at 09/27/15 0829  . [MAR Hold] HYDROmorphone (DILAUDID) injection 0.5-1 mg  0.5-1 mg Intravenous Q2H PRN Corky Mull, MD      . Doug Sou Hold] insulin aspart (novoLOG) injection 0-5 Units  0-5 Units Subcutaneous QHS Hillary Bow, MD   0 Units at 09/26/15 2200  . [MAR Hold] insulin aspart (novoLOG) injection 0-9 Units  0-9 Units Subcutaneous TID WC Hillary Bow, MD   1 Units at 09/27/15 0800  . [MAR Hold] magnesium hydroxide (MILK OF MAGNESIA) suspension 30 mL  30 mL Oral Daily PRN Corky Mull, MD      . Doug Sou Hold] mirtazapine (REMERON) tablet 15 mg  15 mg Oral QHS Hillary Bow, MD   15 mg at 09/26/15 2258  . neomycin-polymyxin B (NEOSPORIN) irrigation solution    PRN Corky Mull, MD   4 mL at 09/27/15 1616  . [MAR Hold] oxyCODONE (Oxy IR/ROXICODONE) immediate release tablet 5-10 mg  5-10 mg Oral Q4H PRN Corky Mull, MD   5 mg at 09/26/15 2258  . [MAR Hold] pantoprazole (PROTONIX) injection 40 mg  40 mg Intravenous QHS Corky Mull, MD   40 mg at 09/26/15 2259  . [MAR  Hold] pravastatin (PRAVACHOL) tablet 40 mg  40 mg Oral q1800 Hillary Bow, MD   40 mg at 09/26/15 2258  . [MAR Hold] sodium phosphate (FLEET) 7-19 GM/118ML enema 1 enema  1 enema Rectal Once PRN Corky Mull, MD      . Doug Sou Hold] traZODone (DESYREL) tablet 100 mg  100 mg Oral QHS Hillary Bow, MD   100 mg at 09/26/15 2258   Facility-Administered Medications Ordered in Other Encounters  Medication Dose Route Frequency Provider Last Rate Last Dose  . esmolol (BREVIBLOC) injection  Anesthesia Intra-op Jonna Clark, CRNA   20 mg at 09/27/15 1655  . fentaNYL (SUBLIMAZE) injection    Anesthesia Intra-op Jonna Clark, CRNA   50 mcg at 09/27/15 1649  . metoprolol (LOPRESSOR) injection    Anesthesia Intra-op Jonna Clark, CRNA   2 mg at 09/27/15 1652  . midazolam (VERSED) injection    Anesthesia Intra-op Jonna Clark, CRNA   1 mg at 09/27/15 1647  . ondansetron (ZOFRAN) injection   Intravenous Anesthesia Intra-op Jonna Clark, CRNA   4 mg at 09/27/15 1618  . phenylephrine (NEO-SYNEPHRINE) 100 mcg/mL in sodium chloride 0.9 % 100 mL infusion  10,000 mcg Intravenous Continuous PRN Jonna Clark, CRNA 6 mL/hr at 09/27/15 1641 10 mcg/min at 09/27/15 1641  . phenylephrine (NEO-SYNEPHRINE) injection   Intravenous Anesthesia Intra-op Jonna Clark, CRNA   200 mcg at 09/27/15 1709     Discharge Medications: Please see discharge summary for a list of discharge medications.  Relevant Imaging Results:  Relevant Lab Results:   Additional Information  (SSN: [id number])  Loralyn Freshwater, LCSW

## 2015-09-27 NOTE — Anesthesia Preprocedure Evaluation (Signed)
Anesthesia Evaluation  Patient identified by MRN, date of birth, ID band Patient awake    Reviewed: Allergy & Precautions, NPO status , Patient's Chart, lab work & pertinent test results  Airway Mallampati: II  TM Distance: >3 FB Neck ROM: Full    Dental  (+) Upper Dentures, Lower Dentures   Pulmonary former smoker,    Pulmonary exam normal breath sounds clear to auscultation       Cardiovascular hypertension, Pt. on medications and Pt. on home beta blockers Normal cardiovascular exam     Neuro/Psych Anxiety Depression Bipolar Disorder Diabetic peripheral neuropathy  Neuromuscular disease    GI/Hepatic negative GI ROS, Neg liver ROS,   Endo/Other  diabetes, Well Controlled, Type 2, Oral Hypoglycemic Agents  Renal/GU Kidney stones     Musculoskeletal negative musculoskeletal ROS (+)   Abdominal Normal abdominal exam  (+)   Peds negative pediatric ROS (+)  Hematology negative hematology ROS (+)   Anesthesia Other Findings   Reproductive/Obstetrics                             Anesthesia Physical Anesthesia Plan  ASA: III  Anesthesia Plan: General   Post-op Pain Management:    Induction: Intravenous  Airway Management Planned: LMA  Additional Equipment:   Intra-op Plan:   Post-operative Plan: Extubation in OR  Informed Consent: I have reviewed the patients History and Physical, chart, labs and discussed the procedure including the risks, benefits and alternatives for the proposed anesthesia with the patient or authorized representative who has indicated his/her understanding and acceptance.   Dental advisory given  Plan Discussed with: CRNA and Surgeon  Anesthesia Plan Comments:         Anesthesia Quick Evaluation

## 2015-09-27 NOTE — Consult Note (Signed)
[facility]  CARDIOLOGY CONSULT NOTE  Patient ID: MOISHY ROMM MRN: [id number] DOB/AGE: 1941/01/12 75 y.o.  Admit date: 09/26/2015 Referring Physician [place] Primary Physician [facility] Primary Cardiologist  Reason for Consultation atrial flutter  HPI: The patient is a 75 year old gentleman with history of essential hypertension, diabetes and bipolar disorder. He was admitted following fracture of his left ankle after falling. The patient underwent surgery today, developed atrial flutter during surgery, treated with esmolol, and diltiazem with rate control, and eventual conversion back to sinus rhythm. The patient currently denies chest pain, shortness of breath, palpitations or heart racing. He denies prior history of atrial fibrillation or atrial flutter. The patient does have a history of alcohol abuse, however denies recent alcohol use.  Review of systems complete and found to be negative unless listed above     Past Medical History  Diagnosis Date  . Diabetes mellitus without complication ([place])   . Hyperlipidemia   . Hypertension   . BPH (benign prostatic hypertrophy)   . Insomnia   . Bipolar disorder ([place])   . Peripheral neuropathy ([place])   . Skin cancer   . Bladder cancer ([place])   . Gangrenous appendicitis     Past Surgical History  Procedure Laterality Date  . Hernia repair    . Appendectomy    . Cholecystectomy    . Skin cancer excision    . Bladder surgery      Prescriptions prior to admission  Medication Sig Dispense Refill Last Dose  . carvedilol (COREG) 6.25 MG tablet Take 1 tablet (6.25 mg total) by mouth 2 (two) times daily with a meal. 60 tablet 6 unknown  . enalapril (VASOTEC) 10 MG tablet Take 1 tablet (10 mg total) by mouth daily. 30 tablet 6 unknown  . finasteride (PROSCAR) 5 MG tablet Take 1 tablet (5 mg total) by mouth daily. 90 tablet 1 unknown  . gabapentin (NEURONTIN) 300 MG capsule Take 1 capsule (300 mg total) by mouth 2 (two) times daily. 60  capsule 6 unknown  . glimepiride (AMARYL) 4 MG tablet Take 1 tablet (4 mg total) by mouth daily before breakfast. 30 tablet 6 unknown  . glucose blood (ONE TOUCH ULTRA TEST) test strip CHECK BLOOD SUGAR TWICE DAILY AS DIRECTED.   unknown  . hydrochlorothiazide (HYDRODIURIL) 25 MG tablet Take 1 tablet (25 mg total) by mouth daily. 30 tablet 6 unknown  . Lancets (ONETOUCH ULTRASOFT) lancets CHECK BLOOD SUGAR TWICE DAILY AS DIRECTED.   unknown  . lovastatin (MEVACOR) 40 MG tablet Take 1 tablet (40 mg total) by mouth at bedtime. 30 tablet 6 unknown  . metFORMIN (GLUCOPHAGE) 500 MG tablet Take 1 tablet (500 mg total) by mouth 2 (two) times daily with a meal. 60 tablet 6 unknown  . mirtazapine (REMERON) 15 MG tablet TAKE ONE TABLET AT BEDTIME   unknown  . traZODone (DESYREL) 100 MG tablet TAKE ONE TABLET AT BEDTIME   unknown  . lamoTRIgine (LAMICTAL) 100 MG tablet Take by mouth.   Taking   Social History   Social History  . Marital Status: Divorced    Spouse Name: N/A  . Number of Children: N/A  . Years of Education: N/A   Occupational History  . Not on file.   Social History Main Topics  . Smoking status: Former Smoker -- 10 years    Types: Cigarettes    Start date: 08/14/1994    Quit date: 03/23/2005  . Smokeless tobacco: Former Systems developer    Types: Snuff, Loss adjuster, chartered  Quit date: 03/23/2010  . Alcohol Use: No  . Drug Use: No  . Sexual Activity: No   Other Topics Concern  . Not on file   Social History Narrative    Family History  Problem Relation Age of Onset  . Transient ischemic attack Mother   . Diabetes Father   . Cancer Brother       Review of systems complete and found to be negative unless listed above      PHYSICAL EXAM  General: Well developed, well nourished, in no acute distress HEENT:  Normocephalic and atramatic Neck:  No JVD.  Lungs: Clear bilaterally to auscultation and percussion. Heart: HRRR . Normal S1 and S2 without gallops or murmurs.  Abdomen: Bowel  sounds are positive, abdomen soft and non-tender  Msk:  Back normal, normal gait. Normal strength and tone for age. Extremities: No clubbing, cyanosis or edema.   Neuro: Alert and oriented X 3. Psych:  Good affect, responds appropriately  Labs:   Lab Results  Component Value Date   WBC 8.5 09/26/2015   HGB 15.1 09/26/2015   HCT 43.9 09/26/2015   MCV 86.0 09/26/2015   PLT 224 09/26/2015    Recent Labs Lab 09/26/15 1624  NA 139  K 4.3  CL 106  CO2 22  BUN 23*  CREATININE 0.99  CALCIUM 9.4  GLUCOSE 141*   No results found for: CKTOTAL, CKMB, CKMBINDEX, TROPONINI Lab Results  Component Value Date   CHOL 195 08/17/2015   CHOL 160 05/11/2015   Lab Results  Component Value Date   HDL 46 08/17/2015   HDL 51 05/11/2015   Lab Results  Component Value Date   LDLCALC 93 08/17/2015   LDLCALC 79 05/11/2015   Lab Results  Component Value Date   TRIG 281* 08/17/2015   TRIG 148 05/11/2015   Lab Results  Component Value Date   CHOLHDL 4.2 08/17/2015   CHOLHDL 3.1 05/11/2015   No results found for: LDLDIRECT    Radiology: Dg Ankle 2 Views Left  09/27/2015  CLINICAL DATA:  Left ankle bimalleolar fracture ORIF EXAM: LEFT ANKLE - 2 VIEW; DG C-ARM 61-120 MIN COMPARISON:  09/26/2015 FINDINGS: Spot fluoroscopic intraoperative views demonstrate plate screw fixation of the left lateral malleolar fracture. Screw fixation of the medial malleolar fracture is well. Anatomic alignment except for minimal residual displacement of the posterior malleolar fragment. IMPRESSION: Status post ORIF of the lateral and medial malleolar fractures. Minimal residual displacement of the posterior malleolar fracture. Preserved left ankle alignment. Electronically Signed   By: Jerilynn Mages.  Shick M.D.   On: 09/27/2015 17:40   Dg Ankle Complete Left  09/26/2015  CLINICAL DATA:  Status post reduction EXAM: LEFT ANKLE COMPLETE - 3+ VIEW COMPARISON:  Similar films from earlier in the same day FINDINGS: Further reduction  of the fracture dislocation of the left ankle is noted. The fracture fragments are in near anatomic alignment on the frontal view although demonstrates some persistent mild displacement of the distal fibular fracture on the lateral projection. IMPRESSION: Status post further reduction and casting. Electronically Signed   By: Inez Catalina M.D.   On: 09/26/2015 19:38   Dg Ankle Complete Left  09/26/2015  CLINICAL DATA:  Post glands EXAM: LEFT ANKLE COMPLETE - 3+ VIEW COMPARISON:  Plain film of the left ankle from earlier same day. FINDINGS: Patient is status post splinting. Stable alignment at the fracture sites. Stable distortion of the ankle mortise. IMPRESSION: No significant change status post splinting. Electronically Signed  By: Franki Cabot M.D.   On: 09/26/2015 17:10   Dg Ankle Complete Left  09/26/2015  CLINICAL DATA:  Pain following fall from step EXAM: LEFT ANKLE COMPLETE - 3+ VIEW COMPARISON:  None. FINDINGS: Frontal, oblique, and lateral views were obtained. There is avulsion of the medial malleolus. The medial malleolus is displaced slightly laterally with respect to the remainder the tibia. There is an obliquely oriented fracture at the distal fibular diaphysis -metaphysis junction with lateral displacement of the distal fracture fragment with respect to the proximal fragment. There is ankle mortise disruption. There are spurs arising from the inferior and posterior calcaneus. IMPRESSION: Fractures of the distal fibula and medial malleolus with gross ankle mortise disruption. Electronically Signed   By: Lowella Grip III M.D.   On: 09/26/2015 15:32   Dg C-arm 1-60 Min  09/27/2015  CLINICAL DATA:  Left ankle bimalleolar fracture ORIF EXAM: LEFT ANKLE - 2 VIEW; DG C-ARM 61-120 MIN COMPARISON:  09/26/2015 FINDINGS: Spot fluoroscopic intraoperative views demonstrate plate screw fixation of the left lateral malleolar fracture. Screw fixation of the medial malleolar fracture is well. Anatomic  alignment except for minimal residual displacement of the posterior malleolar fragment. IMPRESSION: Status post ORIF of the lateral and medial malleolar fractures. Minimal residual displacement of the posterior malleolar fracture. Preserved left ankle alignment. Electronically Signed   By: Jerilynn Mages.  Shick M.D.   On: 09/27/2015 17:40    EKG: Sinus rhythm  ASSESSMENT AND PLAN:   1. Paroxysmal atrial flutter during surgery for left ankle fracture, currently in sinus rhythm, after treatment with asthma wall and diltiazem. Patient currently on carvedilol for essential hypertension. Patient currently clinically and hemodynamically stable.  Recommendations  1. Resume carvedilol 6.25 mg twice a day 2. Telemetry 3. 2-D echocardiogram 4. Further recommendations pending echocardiogram results    Signed: Richelle Glick MD,PhD, [facility] 09/27/2015, 6:34 PM

## 2015-09-27 NOTE — Care Management Note (Signed)
Case Management Note  Patient Details  Name: Rick [name] MRN: [id number] Date of Birth: 1941/08/08  Subjective/Objective:      75yo Rick Rick [name] was admitted 09/26/15 to an Observation bed after he fractured his left ankle at home. PCP=Dr Keith Rake. Pharmacy= [facility]. Lives alone. Has [name] rolling walker at home. When asked about [name] bedside commode he responded " I can use [name] 5 gallon bucket for that." No home oxygen. No home health. Usually drives himself to appointments. OR tech arrived to transport Rick [name] to surgery before we could discuss discharge planning. Case management will follow for discharge planning.             Action/Plan:   Expected Discharge Date:                  Expected Discharge Plan:     In-House Referral:     Discharge planning Services     Post Acute Care Choice:    Choice offered to:     DME Arranged:    DME Agency:     HH Arranged:    [place] Agency:     Status of Service:     Medicare Important Message Given:    Date Medicare IM Given:    Medicare IM give by:    Date Additional Medicare IM Given:    Additional Medicare Important Message give by:     If discussed at [place] of Stay Meetings, dates discussed:    Additional Comments:  Rick Torosyan A, RN 09/27/2015, 2:40 PM

## 2015-09-27 NOTE — H&P (Deleted)
Paper H&P to be scanned into permanent record. H&P reviewed. No changes. 

## 2015-09-27 NOTE — Progress Notes (Addendum)
[facility] [place] NAME: Rick [name]    MR#:  [id number]  DATE OF BIRTH:  09-13-1940  SUBJECTIVE:  CHIEF COMPLAINT:   Chief Complaint  Patient presents with  . Ankle Injury  No complaint except mild right ankle pain.  REVIEW OF SYSTEMS:  CONSTITUTIONAL: No fever, fatigue or weakness.  EYES: No blurred or double vision.  EARS, NOSE, AND THROAT: No tinnitus or ear pain.  RESPIRATORY: No cough, shortness of breath, wheezing or hemoptysis.  CARDIOVASCULAR: No chest pain, orthopnea, edema.  GASTROINTESTINAL: No nausea, vomiting, diarrhea or abdominal pain.  GENITOURINARY: No dysuria, hematuria.  ENDOCRINE: No polyuria, nocturia,  HEMATOLOGY: No anemia, easy bruising or bleeding SKIN: No rash or lesion. MUSCULOSKELETAL: mild right ankle pain NEUROLOGIC: No tingling, numbness, weakness.  PSYCHIATRY: No anxiety or depression.   DRUG ALLERGIES:   Allergies  Allergen Reactions  . Haloperidol     Other reaction(s): Unknown  . Sulfa Antibiotics     Other reaction(s): OTHER    VITALS:  Blood pressure 113/60, pulse 94, temperature 98.1 F (36.7 C), temperature source Oral, resp. rate 18, height 5\' 7"  (1.702 m), weight 87.181 kg (192 lb 3.2 oz), SpO2 94 %.  PHYSICAL EXAMINATION:  GENERAL:  75 y.o.-year-old patient lying in the bed with no acute distress.  EYES: Pupils equal, round, reactive to light and accommodation. No scleral icterus. Extraocular muscles intact.  HEENT: Head atraumatic, normocephalic. Oropharynx and nasopharynx clear.  NECK:  Supple, no jugular venous distention. No thyroid enlargement, no tenderness.  LUNGS: Normal breath sounds bilaterally, no wheezing, rales,rhonchi or crepitation. No use of accessory muscles of respiration.  CARDIOVASCULAR: S1, S2 normal. No murmurs, rubs, or gallops.  ABDOMEN: Soft, nontender, nondistended. Bowel sounds present. No organomegaly or mass.  EXTREMITIES: No pedal edema,  cyanosis, or clubbing. Right ankle in dressing. NEUROLOGIC: Cranial nerves II through XII are intact. Muscle strength 5/5 in all extremities except right leg. Sensation intact. Gait not checked.  PSYCHIATRIC: The patient is alert and oriented x 3.  SKIN: No obvious rash, lesion, or ulcer.    LABORATORY PANEL:   CBC  Recent Labs Lab 09/26/15 1624  WBC 8.5  HGB 15.1  HCT 43.9  PLT 224   ------------------------------------------------------------------------------------------------------------------  Chemistries   Recent Labs Lab 09/26/15 1624  NA 139  K 4.3  CL 106  CO2 22  GLUCOSE 141*  BUN 23*  CREATININE 0.99  CALCIUM 9.4   ------------------------------------------------------------------------------------------------------------------  Cardiac Enzymes No results for input(s): TROPONINI in the last 168 hours. ------------------------------------------------------------------------------------------------------------------  RADIOLOGY:  Dg Ankle Complete Left  09/26/2015  CLINICAL DATA:  Status post reduction EXAM: LEFT ANKLE COMPLETE - 3+ VIEW COMPARISON:  Similar films from earlier in the same day FINDINGS: Further reduction of the fracture dislocation of the left ankle is noted. The fracture fragments are in near anatomic alignment on the frontal view although demonstrates some persistent mild displacement of the distal fibular fracture on the lateral projection. IMPRESSION: Status post further reduction and casting. Electronically Signed   By: Inez Catalina M.D.   On: 09/26/2015 19:38   Dg Ankle Complete Left  09/26/2015  CLINICAL DATA:  Post glands EXAM: LEFT ANKLE COMPLETE - 3+ VIEW COMPARISON:  Plain film of the left ankle from earlier same day. FINDINGS: Patient is status post splinting. Stable alignment at the fracture sites. Stable distortion of the ankle mortise. IMPRESSION: No significant change status post splinting. Electronically Signed   By: Franki Cabot  M.D.   On: 09/26/2015 17:10   Dg Ankle Complete Left  09/26/2015  CLINICAL DATA:  Pain following fall from step EXAM: LEFT ANKLE COMPLETE - 3+ VIEW COMPARISON:  None. FINDINGS: Frontal, oblique, and lateral views were obtained. There is avulsion of the medial malleolus. The medial malleolus is displaced slightly laterally with respect to the remainder the tibia. There is an obliquely oriented fracture at the distal fibular diaphysis -metaphysis junction with lateral displacement of the distal fracture fragment with respect to the proximal fragment. There is ankle mortise disruption. There are spurs arising from the inferior and posterior calcaneus. IMPRESSION: Fractures of the distal fibula and medial malleolus with gross ankle mortise disruption. Electronically Signed   By: Lowella Grip III M.D.   On: 09/26/2015 15:32    EKG:   Orders placed or performed during the hospital encounter of 09/26/15  . EKG 12-Lead  . EKG 12-Lead  . EKG 12-Lead  . EKG 12-Lead    ASSESSMENT AND PLAN:   * Left ankle fracture Pain control. Follow-up also with the surgeon for surgery today.  * Diabetes mellitus Controlled, on sliding scale insulin. Diabetic diet.  * Hypertension Well-controlled. Continue home medications.  * Bipolar disorder Continue medications. Is alert and oriented 3.  Medically stable, discharge per orthopedics surgeon Dr. Roland Rack Sign off . All the records are reviewed and case discussed with Care Management/Social Workerr. Management plans discussed with the patient, family and they are in agreement.  CODE STATUS: Full code.  TOTAL TIME TAKING CARE OF THIS PATIENT: 32 minutes.  Greater than 50% time was spent on coordination of care and face-to-face counseling.  POSSIBLE D/C IN 1 DAYS, DEPENDING ON CLINICAL CONDITION.   Demetrios Loll M.D on 09/27/2015 at 2:18 PM  Between 7am to 6pm - Pager - [phone]  After 6pm go to [email or web address] - password [email or web address] [facility]  [facility]  Office  [phone]  CC: Primary care physician; Keith Rake, MD

## 2015-09-27 NOTE — Anesthesia Procedure Notes (Signed)
Procedure Name: LMA Insertion Date/Time: 09/27/2015 3:52 PM Performed by: Jonna Clark Pre-anesthesia Checklist: Patient identified, Patient being monitored, Timeout performed, Emergency Drugs available and Suction available Patient Re-evaluated:Patient Re-evaluated prior to inductionOxygen Delivery Method: Circle system utilized Preoxygenation: Pre-oxygenation with 100% oxygen Intubation Type: IV induction Ventilation: Mask ventilation without difficulty LMA: LMA inserted LMA Size: 3.5 Tube type: Oral Number of attempts: 1 Placement Confirmation: positive ETCO2 and breath sounds checked- equal and bilateral Tube secured with: Tape Dental Injury: Teeth and Oropharynx as per pre-operative assessment

## 2015-09-27 NOTE — Care Management Obs Status (Signed)
[place] NOTIFICATION   Patient Details  Name: Rick [name] MRN: [id number] Date of Birth: [birth year]   Medicare Observation Status Notification Not Given:  No (Insurgery, no family available)  Left message on phone of sister in [place] requesting a call back.     Lazer Wollard A, RN 09/27/2015, 5:14 PM

## 2015-09-27 NOTE — Transfer of Care (Signed)
Immediate Anesthesia Transfer of Care Note  Patient: Rick [name]  Procedure(s) Performed: Procedure(s): OPEN REDUCTION INTERNAL FIXATION (ORIF) ANKLE FRACTURE (Left)  Patient Location: PACU  Anesthesia Type:General  Level of Consciousness: awake, alert  and confused  Airway & Oxygen Therapy: Patient Spontanous Breathing and Patient connected to face mask oxygen  Post-op Assessment: Report given to RN and Post -op Vital signs reviewed and stable  Post vital signs: Reviewed and stable  Last Vitals:  Filed Vitals:   09/27/15 1456 09/27/15 1757  BP: 111/71 90/42  Pulse: 80 76  Temp: 36.8 C 36.7 C  Resp: 18 12    Complications: No apparent anesthesia complications

## 2015-09-28 ENCOUNTER — Encounter: Payer: Self-pay | Admitting: Surgery

## 2015-09-28 ENCOUNTER — Observation Stay
Admit: 2015-09-28 | Discharge: 2015-09-28 | Disposition: A | Discharge status: Home / Self Care | Payer: Medicare Other | Attending: Cardiology | Admitting: Cardiology

## 2015-09-28 LAB — BASIC METABOLIC PANEL
Anion gap: 5 (ref 5–15)
BUN: 14 mg/dL (ref 6–20)
CO2: 23 mmol/L (ref 22–32)
Calcium: 7.9 mg/dL — ABNORMAL LOW (ref 8.9–10.3)
Chloride: 112 mmol/L — ABNORMAL HIGH (ref 101–111)
Creatinine, Ser: 1.08 mg/dL (ref 0.61–1.24)
GFR calc Af Amer: >60 mL/min (ref >60)
GLUCOSE: 174 mg/dL — AB (ref 65–99)
POTASSIUM: 4.5 mmol/L (ref 3.5–5.1)
Sodium: 140 mmol/L (ref 135–145)

## 2015-09-28 LAB — CBC WITH DIFFERENTIAL/PLATELET
Basophils Absolute: 0.1 10*3/uL (ref 0–0.1)
Basophils Relative: 1 %
EOS PCT: 2 %
Eosinophils Absolute: 0.2 10*3/uL (ref 0–0.7)
HEMATOCRIT: 37.3 % — AB (ref 40.0–52.0)
Hemoglobin: 12.6 g/dL — ABNORMAL LOW (ref 13.0–18.0)
LYMPHS ABS: 1.5 10*3/uL (ref 1.0–3.6)
LYMPHS PCT: 15 %
MCH: 29.8 pg (ref 26.0–34.0)
MCHC: 33.8 g/dL (ref 32.0–36.0)
MCV: 88.2 fL (ref 80.0–100.0)
MONO ABS: 1.1 10*3/uL — AB (ref 0.2–1.0)
Monocytes Relative: 11 %
NEUTROS ABS: 7.2 10*3/uL — AB (ref 1.4–6.5)
Neutrophils Relative %: 71 %
Platelets: 172 10*3/uL (ref 150–440)
RBC: 4.23 MIL/uL — AB (ref 4.40–5.90)
RDW: 13.5 % (ref 11.5–14.5)
WBC: 10 10*3/uL (ref 3.8–10.6)

## 2015-09-28 LAB — GLUCOSE, CAPILLARY
GLUCOSE-CAPILLARY: 155 mg/dL — AB (ref 65–99)
Glucose-Capillary: 174 mg/dL — ABNORMAL HIGH (ref 65–99)
Glucose-Capillary: 161 mg/dL — ABNORMAL HIGH (ref 65–99)
Glucose-Capillary: 161 mg/dL — ABNORMAL HIGH (ref 65–99)

## 2015-09-28 NOTE — Progress Notes (Signed)
Subjective: 1 Day Post-Op Procedure(s) (LRB): OPEN REDUCTION INTERNAL FIXATION (ORIF) ANKLE FRACTURE (Left) Patient reports pain as moderate.   Patient is well but went into Afib/A flutter rhythm during surgery. Plan is to go home with homehealth PT. after hospital stay. Negative for chest pain and shortness of breath Fever: no Gastrointestinal:Negative for nausea and vomiting  Objective: Vital signs in last 24 hours: Temp:  [98.1 F (36.7 C)-99.8 F (37.7 C)] 98.2 F (36.8 C) (02/14 0424) Pulse Rate:  [76-87] 79 (02/14 0424) Resp:  [12-22] 18 (02/14 0424) BP: (90-118)/(42-72) 112/49 mmHg (02/14 0424) SpO2:  [89 %-98 %] 97 % (02/14 0424)  Intake/Output from previous day:  Intake/Output Summary (Last 24 hours) at 09/28/15 0732 Last data filed at 09/28/15 0543  Gross per 24 hour  Intake   3605 ml  Output   1470 ml  Net   2135 ml    Intake/Output this shift:    Labs:  Recent Labs  09/26/15 1624 09/28/15 0533  HGB 15.1 12.6*    Recent Labs  09/26/15 1624 09/28/15 0533  WBC 8.5 10.0  RBC 5.11 4.23*  HCT 43.9 37.3*  PLT 224 172    Recent Labs  09/27/15 1905 09/28/15 0533  NA 141 140  K 4.9 4.5  CL 111 112*  CO2 25 23  BUN 12 14  CREATININE 1.00 1.08  GLUCOSE 155* 174*  CALCIUM 8.3* 7.9*   No results for input(s): LABPT, INR in the last 72 hours.   EXAM General - Patient is Alert and Appropriate Extremity - ABD soft Sensation intact distally Incision: dressing C/D/I Dressing/Incision - Post-op splint is clean and dry with no drainage noted. Motor Function - intact, moving toes well on exam.  Past Medical History  Diagnosis Date  . Diabetes mellitus without complication ([place])   . Hyperlipidemia   . Hypertension   . BPH (benign prostatic hypertrophy)   . Insomnia   . Bipolar disorder ([place])   . Peripheral neuropathy ([place])   . Skin cancer   . Bladder cancer ([place])   . Gangrenous appendicitis     Assessment/Plan: 1 Day Post-Op Procedure(s)  (LRB): OPEN REDUCTION INTERNAL FIXATION (ORIF) ANKLE FRACTURE (Left) Active Problems:   Ankle fracture, left  Estimated body mass index is 30.1 kg/(m^2) as calculated from the following:   Height as of this encounter: 5\' 7"  (1.702 m).   Weight as of this encounter: 87.181 kg (192 lb 3.2 oz). Advance diet Up with therapy D/C IV fluids when tolerating PO intake.  As mentioned above patient went into A. Flutter during operation yesterday.  Dr. Saralyn Pilar consulted, 2D ECHO ordered but I don't see results of the study.  Will await results before discharge.  Pt denies any chest pain this AM. Pt is adamant about going home, I do think he would benefit from a short-term rehab placement.  Care management to assist with discharge, patient would benefit from home health care nurse as well as PT. Labs reviewed. Patient will need to be cleared by PT prior to discharge as well.  DVT Prophylaxis - Lovenox, Foot Pumps and TED hose Non-weight bearing to the left leg.  Raquel Shelena Castelluccio, PA-C [facility] 09/28/2015, 7:32 AM

## 2015-09-28 NOTE — Evaluation (Signed)
Physical Therapy Evaluation Patient Details Name: Rick [name] MRN: [id number] DOB: 1940/08/22 Today's Date: 09/28/2015   History of Present Illness  Pt is a 75 y.o. male with PMH of neuropathy, diabetes and HTN.  Pt was admitted with a L trimalleolar fracture. Pt is s/p ORIF L ankle (09-27-15).  Pt with new onset atrial flutter during surgery but now in NSR.    Clinical Impression  Prior to admission pt was independent and did not use an AD.  Pt lives alone in one story house with one step to enter.  Pt also stated that his son and ex wife can come most days to help take care of him.  Pt displayed WFL strength and mobility on noninvolved (R) LE.  Strength on L LE: Quadriceps: 2/5, Hamstring at least 3/5 ; hip abductors at least 3/5; hip adductors at least 3/5 MMT.  Pt complained of 8/10 pain on L LE at rest and with activity.  Pt was min assist for bed mobility and sit to stand transfer with RW.  Pt required intermittent verbal cues to maintain NWB precautions for bed mobility and transfers.  Pt ambulated 2x18 feet (to and from bathroom) with min assist x2 with RW.  Pt was impulsive and had decreased safety awareness during ambulation.  Pt required constant verbal cues during ambulation for safety and to maintain precautions.  Pt had two knee buckling episodes during ambulation specifically during turning and getting up from commode; pt required assist to steady with managing underwear with toileting. Due to aforementioned function and strength deficits, pt is in need of skilled physical therapy.  It is recommended that pt is discharged to short term rehab when medically appropriate.     Follow Up Recommendations SNF    Equipment Recommendations  Rolling walker with 5" wheels    Recommendations for Other Services       Precautions / Restrictions Precautions Precautions: Fall Restrictions Weight Bearing Restrictions: Yes LLE Weight Bearing: Non weight bearing      Mobility  Bed  Mobility Overal bed mobility: Needs Assistance Bed Mobility: Rolling;Sit to Sidelying;Sidelying to Sit Rolling: Min assist Sidelying to sit: Min assist     Sit to sidelying: Min assist   Required intermittent verbal cues to maintain weight bearing precautions; assist for L LE.  Transfers Overall transfer level: Needs assistance Equipment used: Rolling walker (2 wheeled) Transfers: Sit to/from Stand Sit to Stand: Min assist    Vc's for hand placement.        Ambulation/Gait Ambulation/Gait assistance: Min assist x2 Ambulation Distance (Feet):  (18 feet x 2 ) Assistive device: Rolling walker (2 wheeled) Gait Pattern/deviations: Step-through pattern     General Gait Details: Pt is NWB on L LE, uses increased UE through RW for ambulation Required constant verbal cues for safety and to maintain precautions.    Stairs            Wheelchair Mobility    Modified Rankin (Stroke Patients Only)       Balance Overall balance assessment: Needs assistance Sitting-balance support: Feet supported (R Foot supported only due to precautions) Sitting balance-Leahy Scale: Fair     Standing balance support: Bilateral upper extremity supported (RW) Standing balance-Leahy Scale: Fair                               Pertinent Vitals/Pain Pain Assessment: 0-10 Pain Score: 8  Pain Descriptors / Indicators: Aching;Constant;Operative site guarding;Guarding;Grimacing Pain  Intervention(s): Limited activity within patient's tolerance;Monitored during session;Premedicated before session;Repositioned  See flow sheet for vitals.     Home Living Family/patient expects to be discharged to:: Private residence Living Arrangements: Alone Available Help at Discharge: Family   Home Access: Stairs to enter Entrance Stairs-Rails: Right Entrance Stairs-Number of Steps: 1 Home Layout: One level Home Equipment: None      Prior Function Level of Independence: Independent                Hand Dominance        Extremity/Trunk Assessment   Upper Extremity Assessment: Overall WFL for tasks assessed           Lower Extremity Assessment: Generalized weakness   LLE Deficits / Details: Decreased strength and mobility on L LE, R LE is WFL.  Cervical / Trunk Assessment: Normal  Communication   Communication: No difficulties  Cognition Arousal/Alertness: Awake/alert Behavior During Therapy: Agitated;Impulsive Overall Cognitive Status: No family/caregiver present to determine baseline cognitive functioning                      General Comments   Nursing was contacted and cleared pt for physical therapy.  Pt was pleasant beginning of session but became agitated during session.     Exercises        Assessment/Plan    PT Assessment Patient needs continued PT services  PT Diagnosis Difficulty walking   PT Problem List Decreased strength;Decreased activity tolerance;Decreased balance;Decreased mobility;Pain  PT Treatment Interventions DME instruction;Gait training;Stair training;Patient/family education;Therapeutic activities;Functional mobility training;Therapeutic exercise;Balance training   PT Goals (Current goals can be found in the Care Plan section) Acute Rehab PT Goals Patient Stated Goal: to go home PT Goal Formulation: With patient Time For Goal Achievement: 10/12/15 Potential to Achieve Goals: Fair    Frequency BID   Barriers to discharge  Level of assist      Co-evaluation               End of Session Equipment Utilized During Treatment: Gait belt Activity Tolerance: Patient tolerated treatment well Patient left: in bed;with call bell/phone within reach;with bed alarm set;with nursing/sitter in room Nurse Communication: Mobility status         Time: [id number] PT Time Calculation (min) (ACUTE ONLY): 28 min   Charges:         PT G Codes:       Mittie Bodo, SPT Mittie Bodo 09/28/2015, 1:24  PM

## 2015-09-28 NOTE — Progress Notes (Signed)
*  PRELIMINARY RESULTS* Echocardiogram 2D Echocardiogram has been performed.  Rick [name] 09/28/2015, 9:10 AM

## 2015-09-28 NOTE — Care Management Obs Status (Signed)
[facility] NOTIFICATION   Patient Details  Name: VENNIE GIAMANCO MRN: [id number] Date of Birth: 10-04-1940   Medicare Observation Status Notification Given:  Yes    Marshell Garfinkel, RN 09/28/2015, 7:20 AM

## 2015-09-28 NOTE — Clinical Social Work Note (Signed)
Clinical Social Work Assessment  Patient Details  Name: Rick [name] MRN: [id number] Date of Birth: 11/28/1940  Date of referral:  09/28/15               Reason for consult:  Facility Placement                Permission sought to share information with:    Permission granted to share information::     Name::        Agency::     Relationship::     Contact Information:     Housing/Transportation Living arrangements for the past 2 months:  Single Family Home Source of Information:  Patient Patient Interpreter Needed:  None Criminal Activity/Legal Involvement Pertinent to Current Situation/Hospitalization:  No - Comment as needed Significant Relationships:  Adult Children Lives with:  Self Do you feel safe going back to the place where you live?  Yes Need for family participation in patient care:  Yes (Comment)  Care giving concerns:  Patient lives alone in [place].    Social Worker assessment / plan: Clinical Social Worker (CSW) received SNF consult. PT is recommending SNF. CSW met with patient alone at bedside. Patient was alert and oriented and laying in the bed. CSW introduced self and explained role of CSW department. Patient reported that he lives alone in [place]. Per patient his son and daughter live near by and can provide support. CSW explained that PT is recommending SNF. Patient adamantly refused SNF. Patient reported that he is going home and is open to home health. Patient reported that if his home health co-pays are too high then he does not want home health and stated that "father time will take of my rehab." CSW provided emotional support. RN Case Manager is aware of above. CSW will continue to follow and assist as needed.    Employment status:  Retired Insurance information:  Managed Medicare PT Recommendations:  Skilled Nursing Facility Information / Referral to community resources:  Other (Comment Required) (Patient is refusing SNF )  Patient/Family's  Response to care:  Patient refused SNF.   Patient/Family's Understanding of and Emotional Response to Diagnosis, Current Treatment, and Prognosis:  Patient was pleasant however adamantly refused SNF.   Emotional Assessment Appearance:  Appears stated age Attitude/Demeanor/Rapport:    Affect (typically observed):  Pleasant Orientation:  Oriented to Self, Oriented to Place, Oriented to  Time, Oriented to Situation Alcohol / Substance use:  Not Applicable Psych involvement (Current and /or in the community):  No (Comment)  Discharge Needs  Concerns to be addressed:  Discharge Planning Concerns Readmission within the last 30 days:  No Current discharge risk:  Dependent with Mobility Barriers to Discharge:  Continued Medical Work up   Morgan, Bailey G, LCSW 09/28/2015, 2:43 PM  

## 2015-09-28 NOTE — Progress Notes (Signed)
Pt. Alert and oriented. VSS. Pt. Had great deal of pain throughout the night. Controlled with pain meds per MAR. Ice applied to ankle and ankle repositioned. Using urinal. Tolerated regular diet after surgery last PM. Neurochecks WDL. Resting quietly at this time. Will continue to monitor.

## 2015-09-28 NOTE — Progress Notes (Signed)
PT Cancellation Note  Patient Details Name: Rick [name] MRN: [id number] DOB: 04-07-1941   Cancelled Treatment:    Reason Eval/Treat Not Completed: Patient at procedure or test/unavailable.  Contacted nursing who stated that pt was leaving shortly for echo procedure.  Will reattempt to see pt later today.    Mittie Bodo, SPT Mittie Bodo 09/28/2015, 9:03 AM

## 2015-09-28 NOTE — Care Management Note (Signed)
Case Management Note  Patient Details  Name: Rick [name] MRN: [id number] Date of Birth: 09-28-1940  Subjective/Objective:                  Met with patient to discuss discharge planning. He wants to return home where he lives by himself. He states he has a grandson that might help him but he cannot depend on his sons. He could not tell me who his PCP is but he claims that he has one. He states he was independent/drives prior to this injury. He states he has a walker he can use at home. He is undecided on home health agency. He does not want to go to SNF.   Action/Plan: List of home health agencies left with patient. Cards consult pending for new dysrhythmia however per RN he is back to sinus rhythm.  Expected Discharge Date:                  Expected Discharge Plan:     In-House Referral:  Clinical Social Work  Discharge planning Services  CM Consult  Post Acute Care Choice:  Home Health Choice offered to:     DME Arranged:    DME Agency:     HH Arranged:    [place] Agency:     Status of Service:  In process, will continue to follow  Medicare Important Message Given:    Date Medicare IM Given:    Medicare IM give by:    Date Additional Medicare IM Given:    Additional Medicare Important Message give by:     If discussed at [place] of Stay Meetings, dates discussed:    Additional Comments:  Marshell Garfinkel, RN 09/28/2015, 12:53 PM

## 2015-09-28 NOTE — Progress Notes (Signed)
Physical Therapy Treatment Patient Details Name: Rick [name] MRN: [id number] DOB: 1940/12/24 Today's Date: 09/28/2015    History of Present Illness Pt is a 75 y.o. male with PMH of neuropathy, diabetes and HTN.  Pt was admitted with a L trimalleolar fracture. Pt is s/p ORIF L ankle (09-27-15).  Pt with new onset atrial flutter during surgery but now in NSR.    PT Comments    Pt was min assist for bed mobility and sit to stand with RW.  Pt required intermittent verbal cues to maintain NWB'ing precautions during bed mobility and sit to stand.  Pt ambulated 30 feet with RW with min assist x2.  Pt required constant verbal cues to maintain precautions and safety during ambulation.  Pt had decreased safety awareness and appeared agitated during session (pt's agitation and willingness to participate in PT limited session).     Follow Up Recommendations  SNF     Equipment Recommendations  Rolling walker with 5" wheels    Recommendations for Other Services       Precautions / Restrictions Precautions Precautions: Fall Restrictions Weight Bearing Restrictions: Yes LLE Weight Bearing: Non weight bearing    Mobility  Bed Mobility Overal bed mobility: Needs Assistance Bed Mobility: Rolling;Sit to Sidelying;Sidelying to Sit Rolling: Min assist Sidelying to sit: Min assist     Sit to sidelying: Min assist  Intermittent verbal cues to maintain precautions.  Assist for L LE.  Transfers Overall transfer level: Needs assistance Equipment used: Rolling walker (2 wheeled) Transfers: Sit to/from Stand Sit to Stand: Min assist    Intermittent verbal cues to maintain precautions         Ambulation/Gait Ambulation/Gait assistance: Min assistx2 Ambulation Distance (Feet): 30 Feet Assistive device: Rolling walker (2 wheeled) Gait Pattern/deviations: Step-through pattern     General Gait Details: Pt is NWB on L LE, uses increased UE through RW for ambulation Constant verbal cues  to maintain precautions and safety.  Stairs            Wheelchair Mobility    Modified Rankin (Stroke Patients Only)       Balance Overall balance assessment: Needs assistance Sitting-balance support: Feet supported Sitting balance-Leahy Scale: Fair     Standing balance support: Bilateral upper extremity supported Standing balance-Leahy Scale: Fair                      Cognition Arousal/Alertness: Awake/alert Behavior During Therapy: Agitated;Impulsive Overall Cognitive Status: No family/caregiver present to determine baseline cognitive functioning                      Exercises      General Comments   Nursing was contacted and cleared pt for physical therapy.  Pt appeared agitated during session.  Nursing aide was present at end of session to assist with clean-up (pt's sheets wet from urinal spillage).      Pertinent Vitals/Pain Pt did not c/o any pain and did not answer therapists questions regarding pain.    Home Living      Prior Function          PT Goals (current goals can now be found in the care plan section) Acute Rehab PT Goals Patient Stated Goal: to go home PT Goal Formulation: With patient Time For Goal Achievement: 10/12/15 Potential to Achieve Goals: Fair Progress towards PT goals: Not progressing toward goals - comment: limited activities d/t pt's agitation complicating sessions activities.    Frequency  BID    PT Plan Current plan remains appropriate    Co-evaluation             End of Session Equipment Utilized During Treatment: Gait belt Activity Tolerance: Treatment limited secondary to agitation Patient left: in bed;with call bell/phone within reach;with bed alarm set;with nursing/sitter in room     Time: [id number] PT Time Calculation (min) (ACUTE ONLY): 14 min  Charges:                       G Codes:      Mittie Bodo, SPT Mittie Bodo 09/28/2015, 2:54 PM

## 2015-09-29 LAB — GLUCOSE, CAPILLARY
GLUCOSE-CAPILLARY: 155 mg/dL — AB (ref 65–99)
Glucose-Capillary: 159 mg/dL — ABNORMAL HIGH (ref 65–99)

## 2015-09-29 LAB — BASIC METABOLIC PANEL
Anion gap: 7 (ref 5–15)
BUN: 18 mg/dL (ref 6–20)
CHLORIDE: 107 mmol/L (ref 101–111)
CO2: 23 mmol/L (ref 22–32)
CREATININE: 1.01 mg/dL (ref 0.61–1.24)
Calcium: 8.3 mg/dL — ABNORMAL LOW (ref 8.9–10.3)
GFR calc non Af Amer: >60 mL/min (ref >60)
Glucose, Bld: 163 mg/dL — ABNORMAL HIGH (ref 65–99)
POTASSIUM: 4 mmol/L (ref 3.5–5.1)
SODIUM: 137 mmol/L (ref 135–145)

## 2015-09-29 MED ORDER — OXYCODONE HCL 5 MG PO TABS: 5.0000 mg | ORAL_TABLET | ORAL | Status: DC | PRN

## 2015-09-29 NOTE — Progress Notes (Signed)
SNF and Non-Emergent EMS Transport Benefits:  Number called: [phone] Rep: [place] Reference Number: [id number]  [place] Medicare Complete HMO Plan Two active as of 08/15/15 with no deductible.  Out of pocket max is $6700, of which $30 met so far.  In-network SNF: $0 copay for days 1-20, a $160 daily copay for days 21-62, and a $0 copay for days 63-100.  Once out of pocket is reached, patient covered at 100% for remainder of 100 day benefit period.  $0 copay for professional fees and 3 day hospital stay is not required.  [name] is required: [phone].    Non-emergent EMS transport: $250 copay for each one way medically necessary, Medicare covered trip.  [name] is required: [phone].

## 2015-09-29 NOTE — Progress Notes (Signed)
Pt discharged to home, reviewed d/c instructions, pt continues to refuse SNF. Ex-wife provided transport to home, [facility] will follow after d/c.

## 2015-09-29 NOTE — Progress Notes (Signed)
Physical Therapy Treatment Patient Details Name: Rick [name] MRN: [id number] DOB: [birth year] Today's Date: 09/29/2015    History of Present Illness Pt is a 75 y.o. male with PMH of neuropathy, diabetes and HTN.  Pt was admitted with a L trimalleolar fracture. Pt is s/p ORIF L ankle (09-27-15).  Pt with new onset atrial flutter during surgery but now in NSR.    PT Comments    Treatment session was limited due to Pt agitation.  Pt required min assist with L LE during bed mobility.  Pt was min assist for sit to stand and min assist x2 for ambulation of 45 feet with RW.  Pt was impulsive and demonstrated decreased safety awareness.  Pt displayed altering increased cadence during session.  Pt required constant verbal cues during ambulation for gait speed, safety awareness and NWB precautions on L LE.  Pt does not appear safe to discharge home.        Follow Up Recommendations  SNF     Equipment Recommendations  Rolling walker with 5" wheels    Recommendations for Other Services       Precautions / Restrictions Precautions Precautions: Fall Restrictions Weight Bearing Restrictions: Yes LLE Weight Bearing: Non weight bearing    Mobility  Bed Mobility Overal bed mobility: Needs Assistance Bed Mobility: Rolling;Sit to Sidelying;Sidelying to Sit Rolling: Min assist (Moving L LE ) Sidelying to sit: Min assist (Moving L LE )     Sit to sidelying: Min assist (Moving L LE )    Transfers Overall transfer level: Needs assistance Equipment used: Rolling walker (2 wheeled) Transfers: Sit to/from Stand Sit to Stand: Min assist            Ambulation/Gait Ambulation/Gait assistance: Min assist;+2 physical assistance Ambulation Distance (Feet): 45 Feet Assistive device: Rolling walker (2 wheeled) Gait Pattern/deviations: Step-through pattern     General Gait Details: Pt is NWB on L LE, uses increased UE through RW for ambulation  Required verbal cues for gait speed, safety  awareness and NWB precautions.   Stairs            Wheelchair Mobility    Modified Rankin (Stroke Patients Only)       Balance Overall balance assessment: Needs assistance Sitting-balance support: Feet supported Sitting balance-Leahy Scale: Fair     Standing balance support: Bilateral upper extremity supported (RW) Standing balance-Leahy Scale: Fair                      Cognition Arousal/Alertness: Awake/alert Behavior During Therapy: Agitated;Impulsive Overall Cognitive Status: No family/caregiver present to determine baseline cognitive functioning                      Exercises      General Comments   Nursing was contacted and cleared pt for physical therapy.  Pt appeared agitated during session.      Pertinent Vitals/Pain Pain Assessment: Faces Faces Pain Scale: Hurts even more Pain Descriptors / Indicators: Aching;Grimacing Pain Intervention(s): Limited activity within patient's tolerance;Monitored during session;Premedicated before session;Repositioned  See flow sheet for vitals.     Home Living                      Prior Function            PT Goals (current goals can now be found in the care plan section) Acute Rehab PT Goals Patient Stated Goal: to go home PT Goal Formulation: With patient  Time For Goal Achievement: 10/12/15 Potential to Achieve Goals: Fair Progress towards PT goals: Progressing toward goals    Frequency  BID    PT Plan Current plan remains appropriate    Co-evaluation             End of Session Equipment Utilized During Treatment: Gait belt Activity Tolerance: Treatment limited secondary to agitation Patient left: in bed;with call bell/phone within reach;with bed alarm set;with nursing/sitter in room     Time: 0945-1001 PT Time Calculation (min) (ACUTE ONLY): 16 min  Charges:                       G Codes:       Mittie Bodo, SPT Mittie Bodo 09/29/2015, 10:09 AM

## 2015-09-29 NOTE — Progress Notes (Signed)
Spoke with Rosendo Gros, [facility] rep at [phone], to notify of non-emergent EMS transport.  Auth notification reference given as [id number].   Service date range good from 09/29/15 - 12/28/15.   Gap exception requested to determine if services can be considered at an in-network level.

## 2015-09-29 NOTE — Care Management (Signed)
Patient refused walker. Insists that he has one at home. Advised patient to call family for ride home. BM pending.

## 2015-09-29 NOTE — Discharge Summary (Signed)
Physician Discharge Summary  Patient ID: Rick [name] MRN: [id number] DOB/AGE: [birth year] 75 y.o.  Admit date: 09/26/2015 Discharge date: 09/29/2015  Admission Diagnoses:  Trimalleolar fracture, left, closed, initial encounter [S82.852A] Closed trimalleolar fracture dislocation, left ankle  Discharge Diagnoses: Patient Active Problem List   Diagnosis Date Noted  . Ankle fracture, left 09/26/2015  . Malignant neoplasm of urinary bladder ([place]) 08/05/2015  . Benign fibroma of prostate 08/05/2015  . History of alcoholism ([place]) 08/05/2015  . History of migraine headaches 08/05/2015  . Impacted cerumen of left ear 08/05/2015  . Mastoiditis 08/05/2015  . Calculus of kidney 08/05/2015  . Disorder of peripheral nervous system ([place]) 08/05/2015  . Skin lesion 08/05/2015  . Superficial acne vulgaris 08/05/2015  . Back pain, thoracic 08/05/2015  . Diabetes mellitus, type 2 ([place]) 08/05/2015  . Encounter to establish care 08/05/2015  . Chest pain, atypical 06/23/2015  . Left hand pain 05/18/2015  . Need for immunization against influenza 05/11/2015  . Hypertension 05/11/2015  . Hyperlipidemia 05/11/2015  . Ear problem 03/24/2015  . Affective bipolar disorder ([place]) 03/24/2015  . Diabetic peripheral neuropathy associated with type 2 diabetes mellitus ([place]) 03/24/2015  . Cannot sleep 03/24/2015  . Mass of arm 02/10/2015  . History of abdominal hernia 02/10/2015  . Anxiety and depression 02/10/2015  . Anxiety, generalized 05/18/2014  . Chronic prostatitis 06/12/2013  . H/O urinary disorder 03/27/2013  . Herpesviral infection of penis 11/07/2012  . Incomplete bladder emptying 11/07/2012  . Balanoposthitis 10/31/2012  . Benign prostatic hyperplasia with urinary obstruction 10/31/2012  . Difficult or painful urination 10/31/2012  . ED (erectile dysfunction) of organic origin 10/31/2012  . 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 UD (urethral discharge) 10/31/2012  Closed  trimalleolar fracture dislocation, left ankle  Past Medical History  Diagnosis Date  . Diabetes mellitus without complication ([place])   . Hyperlipidemia   . Hypertension   . BPH (benign prostatic hypertrophy)   . Insomnia   . Bipolar disorder ([place])   . Peripheral neuropathy ([place])   . Skin cancer   . Bladder cancer ([place])   . Gangrenous appendicitis      Transfusion: None   Consultants (if any):  Isaias Cowman, MD  Discharged Condition: Improved  Hospital Course: Rick [name] is an 75 y.o. male who was admitted 09/26/2015 with a diagnosis of closed trimalleolar fracture dislocation, left ankle and went to the operating room on 09/26/2015 - 09/27/2015 and underwent the above named procedures.    Surgeries: Procedure(s): OPEN REDUCTION INTERNAL FIXATION (ORIF) ANKLE FRACTURE on 09/26/2015 - 09/27/2015 Patient tolerated the surgery well. Taken to PACU where she was stabilized and then transferred to the orthopedic floor.  Started on Lovenox 40mg  q 24 hrs. Foot pumps applied bilaterally at 80 mm. Heels elevated on bed with rolled towels. No evidence of DVT. Negative Homan. Physical therapy started on day #1 for gait training and transfer. OT started day #1 for ADL and assisted devices.  Patient's IV and foley were removed on POD1  Implants: Biomet 7-hole composite locking plate and screws  He was given perioperative antibiotics:  Anti-infectives    Start     Dose/Rate Route Frequency Ordered Stop   09/27/15 1945  ceFAZolin (ANCEF) IVPB 2 g/50 mL premix     2 g 100 mL/hr over 30 Minutes Intravenous Every 6 hours 09/27/15 1938 09/28/15 0826   09/27/15 1531  ceFAZolin (ANCEF) 2-3 GM-% IVPB SOLR    Comments:  Rick [name]: cabinet override  09/27/15 1531 09/27/15 1554   09/26/15 1930  ceFAZolin (ANCEF) IVPB 2 g/50 mL premix     2 g 100 mL/hr over 30 Minutes Intravenous  Once 09/26/15 1921 09/26/15 2028     He was given sequential compression devices, early ambulation,  and lovenox for DVT prophylaxis.  He benefited maximally from the hospital stay and there were no complications.    Recent vital signs:  Filed Vitals:   09/28/15 1923 09/29/15 0403  BP: 105/59 106/53  Pulse: 90 95  Temp: 98.3 F (36.8 C) 98.5 F (36.9 C)  Resp: 18 27    Recent laboratory studies:  Lab Results  Component Value Date   HGB 12.6* 09/28/2015   HGB 15.1 09/26/2015   HGB 15.7 11/28/2012   Lab Results  Component Value Date   WBC 10.0 09/28/2015   PLT 172 09/28/2015   No results found for: INR Lab Results  Component Value Date   NA 137 09/29/2015   K 4.0 09/29/2015   CL 107 09/29/2015   CO2 23 09/29/2015   BUN 18 09/29/2015   CREATININE 1.01 09/29/2015   GLUCOSE 163* 09/29/2015    Discharge Medications:     Medication List    TAKE these medications        carvedilol 6.25 MG tablet  Commonly known as:  COREG  Take 1 tablet (6.25 mg total) by mouth 2 (two) times daily with a meal.     enalapril 10 MG tablet  Commonly known as:  VASOTEC  Take 1 tablet (10 mg total) by mouth daily.     finasteride 5 MG tablet  Commonly known as:  PROSCAR  Take 1 tablet (5 mg total) by mouth daily.     gabapentin 300 MG capsule  Commonly known as:  NEURONTIN  Take 1 capsule (300 mg total) by mouth 2 (two) times daily.     glimepiride 4 MG tablet  Commonly known as:  AMARYL  Take 1 tablet (4 mg total) by mouth daily before breakfast.     hydrochlorothiazide 25 MG tablet  Commonly known as:  HYDRODIURIL  Take 1 tablet (25 mg total) by mouth daily.     lamoTRIgine 100 MG tablet  Commonly known as:  LAMICTAL  Take by mouth.     lovastatin 40 MG tablet  Commonly known as:  MEVACOR  Take 1 tablet (40 mg total) by mouth at bedtime.     metFORMIN 500 MG tablet  Commonly known as:  GLUCOPHAGE  Take 1 tablet (500 mg total) by mouth 2 (two) times daily with a meal.     mirtazapine 15 MG tablet  Commonly known as:  REMERON  TAKE ONE TABLET AT BEDTIME     ONE  TOUCH ULTRA TEST test strip  Generic drug:  glucose blood  CHECK BLOOD SUGAR TWICE DAILY AS DIRECTED.     onetouch ultrasoft lancets  CHECK BLOOD SUGAR TWICE DAILY AS DIRECTED.     oxyCODONE 5 MG immediate release tablet  Commonly known as:  Oxy IR/ROXICODONE  Take 1-2 tablets (5-10 mg total) by mouth every 4 (four) hours as needed for severe pain.     traZODone 100 MG tablet  Commonly known as:  DESYREL  TAKE ONE TABLET AT BEDTIME        Diagnostic Studies: Dg Ankle 2 Views Left  09/27/2015  CLINICAL DATA:  Left ankle bimalleolar fracture ORIF EXAM: LEFT ANKLE - 2 VIEW; DG C-ARM 61-120 MIN COMPARISON:  09/26/2015 FINDINGS: Spot fluoroscopic  intraoperative views demonstrate plate screw fixation of the left lateral malleolar fracture. Screw fixation of the medial malleolar fracture is well. Anatomic alignment except for minimal residual displacement of the posterior malleolar fragment. IMPRESSION: Status post ORIF of the lateral and medial malleolar fractures. Minimal residual displacement of the posterior malleolar fracture. Preserved left ankle alignment. Electronically Signed   By: Jerilynn Mages.  Shick M.D.   On: 09/27/2015 17:40   Dg Ankle Complete Left  09/26/2015  CLINICAL DATA:  Status post reduction EXAM: LEFT ANKLE COMPLETE - 3+ VIEW COMPARISON:  Similar films from earlier in the same day FINDINGS: Further reduction of the fracture dislocation of the left ankle is noted. The fracture fragments are in near anatomic alignment on the frontal view although demonstrates some persistent mild displacement of the distal fibular fracture on the lateral projection. IMPRESSION: Status post further reduction and casting. Electronically Signed   By: Inez Catalina M.D.   On: 09/26/2015 19:38   Dg Ankle Complete Left  09/26/2015  CLINICAL DATA:  Post glands EXAM: LEFT ANKLE COMPLETE - 3+ VIEW COMPARISON:  Plain film of the left ankle from earlier same day. FINDINGS: Patient is status post splinting. Stable  alignment at the fracture sites. Stable distortion of the ankle mortise. IMPRESSION: No significant change status post splinting. Electronically Signed   By: Franki Cabot M.D.   On: 09/26/2015 17:10   Dg Ankle Complete Left  09/26/2015  CLINICAL DATA:  Pain following fall from step EXAM: LEFT ANKLE COMPLETE - 3+ VIEW COMPARISON:  None. FINDINGS: Frontal, oblique, and lateral views were obtained. There is avulsion of the medial malleolus. The medial malleolus is displaced slightly laterally with respect to the remainder the tibia. There is an obliquely oriented fracture at the distal fibular diaphysis -metaphysis junction with lateral displacement of the distal fracture fragment with respect to the proximal fragment. There is ankle mortise disruption. There are spurs arising from the inferior and posterior calcaneus. IMPRESSION: Fractures of the distal fibula and medial malleolus with gross ankle mortise disruption. Electronically Signed   By: Lowella Grip III M.D.   On: 09/26/2015 15:32   Dg C-arm 1-60 Min  09/27/2015  CLINICAL DATA:  Left ankle bimalleolar fracture ORIF EXAM: LEFT ANKLE - 2 VIEW; DG C-ARM 61-120 MIN COMPARISON:  09/26/2015 FINDINGS: Spot fluoroscopic intraoperative views demonstrate plate screw fixation of the left lateral malleolar fracture. Screw fixation of the medial malleolar fracture is well. Anatomic alignment except for minimal residual displacement of the posterior malleolar fragment. IMPRESSION: Status post ORIF of the lateral and medial malleolar fractures. Minimal residual displacement of the posterior malleolar fracture. Preserved left ankle alignment. Electronically Signed   By: Jerilynn Mages.  Shick M.D.   On: 09/27/2015 17:40    Disposition:   Pt is stable and ready for discharge home with home-health PT.  Pt is requesting crutches as well as a walker for ambulation at home.  Cardio consulted, awaiting recommendations following 2D ECHO, will plan on discharge following this note  as well as morning PT session.      Follow-up Information    Follow up with Judson Roch, PA-C In 10 days.   Specialty:  Physician Assistant   Why:  For suture removal, For wound re-check   Contact information:   [place] [address] [phone]        Signed: Judson Roch 09/29/2015, 7:34 AM

## 2015-09-29 NOTE — Care Management (Signed)
Spoke with patient's ex wife Jeannene Patella [phone]) and she states "he can't go home". She states "his sister Tilda Burrow [phone]) is coming up there to talk him into going". She is unsure of walker. Patient would like to use [facility]. I spoke with his sister Tilda Burrow and she has called him. She is NOT coming up here because her husband is sick. Patient refused SNF. Rolling walker requested from Will with [facility]. Pam to provide transportation home. Case closed.

## 2015-09-29 NOTE — Anesthesia Postprocedure Evaluation (Signed)
Anesthesia Post Note  Patient: [name]  Procedure(s) Performed: Procedure(s) (LRB): OPEN REDUCTION INTERNAL FIXATION (ORIF) ANKLE FRACTURE (Left)  Patient location during evaluation: PACU Anesthesia Type: General Level of consciousness: awake and alert and oriented Pain management: pain level controlled Vital Signs Assessment: post-procedure vital signs reviewed and stable Respiratory status: spontaneous breathing Cardiovascular status: blood pressure returned to baseline Anesthetic complications: no    Last Vitals:  Filed Vitals:   09/29/15 0746 09/29/15 0957  BP: 106/56   Pulse: 102 104  Temp: 36.7 C   Resp: 17     Last Pain:  Filed Vitals:   09/29/15 1006  PainSc: Asleep                 Latona Krichbaum

## 2015-09-29 NOTE — Progress Notes (Signed)
Pt discharged via wheelchair  to home care of family

## 2015-09-29 NOTE — Discharge Instructions (Signed)
Diet: As you were doing prior to hospitalization   Shower:  May shower but keep the wounds dry, use an occlusive plastic wrap, NO SOAKING IN TUB.  If the bandage gets wet, change with a clean dry gauze.  Dressing:  Remain in the left foot post-op splint.    Activity:  Increase activity slowly as tolerated, but follow the weight bearing instructions below.  No lifting or driving for 6 weeks.  Weight Bearing:   Non-weight bearing to the left lower extremity.  To prevent constipation: you may use a stool softener such as -  Colace (over the counter) 100 mg by mouth twice a day  Drink plenty of fluids (prune juice may be helpful) and high fiber foods Miralax (over the counter) for constipation as needed.    Itching:  If you experience itching with your medications, try taking only a single pain pill, or even half a pain pill at a time.  You may take up to 10 pain pills per day, and you can also use benadryl over the counter for itching or also to help with sleep.   Precautions:  If you experience chest pain or shortness of breath - call 911 immediately for transfer to the hospital emergency department!!  If you develop a fever greater that 101 F, purulent drainage from wound, increased redness or drainage from wound, or calf pain-Call [facility]                                               Follow- Up Appointment:  Please call for an appointment to be seen in 2 weeks at [facility]

## 2015-09-29 NOTE — Progress Notes (Signed)
Subjective: 2 Days Post-Op Procedure(s) (LRB): OPEN REDUCTION INTERNAL FIXATION (ORIF) ANKLE FRACTURE (Left) Patient reports pain as 4 on 0-10 scale.   Patient is well but went into Afib/A flutter rhythm during surgery. Plan is to go home with homehealth PT. after hospital stay. Negative for chest pain and shortness of breath Fever: no Gastrointestinal:Negative for nausea and vomiting  Objective: Vital signs in last 24 hours: Temp:  [98 F (36.7 C)-98.8 F (37.1 C)] 98.5 F (36.9 C) (02/15 0403) Pulse Rate:  [85-102] 95 (02/15 0403) Resp:  [16-27] 27 (02/15 0403) BP: (105-127)/(51-66) 106/53 mmHg (02/15 0403) SpO2:  [92 %-98 %] 92 % (02/15 0403)  Intake/Output from previous day:  Intake/Output Summary (Last 24 hours) at 09/29/15 0728 Last data filed at 09/29/15 0435  Gross per 24 hour  Intake    800 ml  Output   2225 ml  Net  -1425 ml    Intake/Output this shift:    Labs:  Recent Labs  09/26/15 1624 09/28/15 0533  HGB 15.1 12.6*    Recent Labs  09/26/15 1624 09/28/15 0533  WBC 8.5 10.0  RBC 5.11 4.23*  HCT 43.9 37.3*  PLT 224 172    Recent Labs  09/28/15 0533 09/29/15 0507  NA 140 137  K 4.5 4.0  CL 112* 107  CO2 23 23  BUN 14 18  CREATININE 1.08 1.01  GLUCOSE 174* 163*  CALCIUM 7.9* 8.3*   No results for input(s): LABPT, INR in the last 72 hours.   EXAM General - Patient is Alert and Appropriate Extremity - ABD soft Sensation intact distally Incision: dressing C/D/I Dressing/Incision - Post-op splint is clean and dry with no drainage noted. Motor Function - intact, moving toes well on exam.  Past Medical History  Diagnosis Date  . Diabetes mellitus without complication ([place])   . Hyperlipidemia   . Hypertension   . BPH (benign prostatic hypertrophy)   . Insomnia   . Bipolar disorder ([place])   . Peripheral neuropathy ([place])   . Skin cancer   . Bladder cancer ([place])   . Gangrenous appendicitis     Assessment/Plan: 2 Days Post-Op  Procedure(s) (LRB): OPEN REDUCTION INTERNAL FIXATION (ORIF) ANKLE FRACTURE (Left) Active Problems:   Ankle fracture, left  Estimated body mass index is 30.1 kg/(m^2) as calculated from the following:   Height as of this encounter: 5\' 7"  (1.702 m).   Weight as of this encounter: 87.181 kg (192 lb 3.2 oz). Advance diet Up with therapy   As mentioned above patient went into A. Flutter during operation yesterday.  Dr. Saralyn Pilar consulted, 2D ECHO performed.  Awaiting recommendations from Cardio. Pt is adamant about going home, I do think he would benefit from a short-term rehab placement.  Care management to assist with discharge, patient would benefit from home health care nurse as well as PT.  PT recommending SNF but patient refuses to go to SNF. Labs reviewed and stable. Plan on discharge home today with home health PT pending Cardio input and morning PT session.  Pt will take 2 81mg  ASA daily for the next 2 weeks for DVT prophylaxis. Follow-up in the office in 10-14 days for splint removal and cast placement.  DVT Prophylaxis - Lovenox, Foot Pumps and TED hose Non-weight bearing to the left leg.  Raquel Shela Esses, PA-C [facility] Orthopaedic Surgery 09/29/2015, 7:28 AM

## 2015-09-29 NOTE — Progress Notes (Signed)
Per RN Case Manager patient continues to refuse SNF today and will D/C home with home health. Please reconsult if future social work needs arise. CSW signing off.   Blima Rich, LCSW [phone]

## 2015-09-29 NOTE — Progress Notes (Signed)
[facility] Cardiology  SUBJECTIVE: I don't have chest pain   Filed Vitals:   09/28/15 1923 09/29/15 0403 09/29/15 0746 09/29/15 0754  BP: 105/59 106/53 106/56 124/69  Pulse: 90 95 102 72  Temp: 98.3 F (36.8 C) 98.5 F (36.9 C) 98.1 F (36.7 C) 97.4 F (36.3 C)  TempSrc: Oral Oral Oral Oral  Resp: 18 27 17    Height:      Weight:      SpO2: 94% 92% 93% 96%     Intake/Output Summary (Last 24 hours) at 09/29/15 [id number] Last data filed at 09/29/15 0435  Gross per 24 hour  Intake    600 ml  Output   1950 ml  Net  -1350 ml      PHYSICAL EXAM  General: Well developed, well nourished, in no acute distress HEENT:  Normocephalic and atramatic Neck:  No JVD.  Lungs: Clear bilaterally to auscultation and percussion. Heart: HRRR . Normal S1 and S2 without gallops or murmurs.  Abdomen: Bowel sounds are positive, abdomen soft and non-tender  Msk:  Back normal, normal gait. Normal strength and tone for age. Extremities: No clubbing, cyanosis or edema.   Neuro: Alert and oriented X 3. Psych:  Good affect, responds appropriately   LABS: Basic Metabolic Panel:  Recent Labs  09/28/15 0533 09/29/15 0507  NA 140 137  K 4.5 4.0  CL 112* 107  CO2 23 23  GLUCOSE 174* 163*  BUN 14 18  CREATININE 1.08 1.01  CALCIUM 7.9* 8.3*   Liver Function Tests: No results for input(s): AST, ALT, ALKPHOS, BILITOT, PROT, ALBUMIN in the last 72 hours. No results for input(s): LIPASE, AMYLASE in the last 72 hours. CBC:  Recent Labs  09/26/15 1624 09/28/15 0533  WBC 8.5 10.0  NEUTROABS 6.3 7.2*  HGB 15.1 12.6*  HCT 43.9 37.3*  MCV 86.0 88.2  PLT 224 172   Cardiac Enzymes: No results for input(s): CKTOTAL, CKMB, CKMBINDEX, TROPONINI in the last 72 hours. BNP: Invalid input(s): POCBNP D-Dimer: No results for input(s): DDIMER in the last 72 hours. Hemoglobin A1C: No results for input(s): HGBA1C in the last 72 hours. Fasting Lipid Panel: No results for input(s): CHOL, HDL, LDLCALC, TRIG,  CHOLHDL, LDLDIRECT in the last 72 hours. Thyroid Function Tests: No results for input(s): TSH, T4TOTAL, T3FREE, THYROIDAB in the last 72 hours.  Invalid input(s): FREET3 Anemia Panel: No results for input(s): VITAMINB12, FOLATE, FERRITIN, TIBC, IRON, RETICCTPCT in the last 72 hours.  Dg Ankle 2 Views Left  09/27/2015  CLINICAL DATA:  Left ankle bimalleolar fracture ORIF EXAM: LEFT ANKLE - 2 VIEW; DG C-ARM 61-120 MIN COMPARISON:  09/26/2015 FINDINGS: Spot fluoroscopic intraoperative views demonstrate plate screw fixation of the left lateral malleolar fracture. Screw fixation of the medial malleolar fracture is well. Anatomic alignment except for minimal residual displacement of the posterior malleolar fragment. IMPRESSION: Status post ORIF of the lateral and medial malleolar fractures. Minimal residual displacement of the posterior malleolar fracture. Preserved left ankle alignment. Electronically Signed   By: Jerilynn Mages.  Shick M.D.   On: 09/27/2015 17:40   Dg C-arm 1-60 Min  09/27/2015  CLINICAL DATA:  Left ankle bimalleolar fracture ORIF EXAM: LEFT ANKLE - 2 VIEW; DG C-ARM 61-120 MIN COMPARISON:  09/26/2015 FINDINGS: Spot fluoroscopic intraoperative views demonstrate plate screw fixation of the left lateral malleolar fracture. Screw fixation of the medial malleolar fracture is well. Anatomic alignment except for minimal residual displacement of the posterior malleolar fragment. IMPRESSION: Status post ORIF of the lateral and medial  malleolar fractures. Minimal residual displacement of the posterior malleolar fracture. Preserved left ankle alignment. Electronically Signed   By: Jerilynn Mages.  Shick M.D.   On: 09/27/2015 17:40     Echo normal left ventricular function with LVEF 55-65%  TELEMETRY: Normal sinus rhythm:  ASSESSMENT AND PLAN:  Active Problems:   Ankle fracture, left    1. Paroxysmal atrial flutter during surgery, converted to sinus rhythm, normal left ventricular function 2. Essential  hypertension  Recommendations  1. Defer chronic anticoagulation 2. Follow-up as outpatient  Signed off for now, please call if any questions  [name], MD, PhD, [facility] 09/29/2015 8:32 AM

## 2015-10-01 DIAGNOSIS — S82852A Displaced trimalleolar fracture of left lower leg, initial encounter for closed fracture: Secondary | ICD-10-CM

## 2015-10-01 HISTORY — DX: Displaced trimalleolar fracture of left lower leg, initial encounter for closed fracture: S82.852A

## 2015-10-04 ENCOUNTER — Encounter: Payer: Self-pay | Admitting: Family Medicine

## 2015-10-04 ENCOUNTER — Ambulatory Visit (INDEPENDENT_AMBULATORY_CARE_PROVIDER_SITE_OTHER): Payer: Medicare Other | Admitting: Family Medicine

## 2015-10-04 VITALS — BP 144/89 | HR 88 | Resp 18

## 2015-10-04 DIAGNOSIS — E119 Type 2 diabetes mellitus without complications: Secondary | ICD-10-CM | POA: Insufficient documentation

## 2015-10-04 LAB — POCT GLYCOSYLATED HEMOGLOBIN (HGB A1C)

## 2015-10-04 MED ORDER — INSULIN GLARGINE 100 UNIT/ML SOLOSTAR PEN: 5.0000 [IU] | PEN_INJECTOR | Freq: Every day | SUBCUTANEOUS | Status: DC

## 2015-10-04 NOTE — Progress Notes (Signed)
Name: Rick [name]   MRN: [id number]    DOB: 01/31/1941   Date:10/04/2015       Progress Note  Subjective  Chief Complaint  Chief Complaint  Patient presents with  . Diabetes    HOSP FU    HPI Here because of DM being out of control since he fractured his ankle on 09/26/15.  Reports getting insulin in Hospital before fracture surgery.  Says BSs have been running up to 280s since he has broken his ankle.  He has been adjusting his Glimeperide and Metformin  Himself during the poast week at ho=om e even though asked not to.  He has an appointment at Dr,. Crissman's office on 10/18/15 to establish as a new patient with Dr. Patricia Nettle.  No problem-specific assessment & plan notes found for this encounter.   Past Medical History  Diagnosis Date  . Diabetes mellitus without complication ([place])   . Hyperlipidemia   . Hypertension   . BPH (benign prostatic hypertrophy)   . Insomnia   . Bipolar disorder ([place])   . Peripheral neuropathy ([place])   . Skin cancer   . Bladder cancer ([place])   . Gangrenous appendicitis     Social History  Substance Use Topics  . Smoking status: Former Smoker -- 10 years    Types: Cigarettes    Start date: 08/14/1994    Quit date: 03/23/2005  . Smokeless tobacco: Former Systems developer    Types: Snuff, Chew    Quit date: 03/23/2010  . Alcohol Use: No     Current outpatient prescriptions:  .  carvedilol (COREG) 6.25 MG tablet, Take 1 tablet (6.25 mg total) by mouth 2 (two) times daily with a meal., Disp: 60 tablet, Rfl: 6 .  enalapril (VASOTEC) 10 MG tablet, Take 1 tablet (10 mg total) by mouth daily., Disp: 30 tablet, Rfl: 6 .  finasteride (PROSCAR) 5 MG tablet, Take 1 tablet (5 mg total) by mouth daily., Disp: 90 tablet, Rfl: 1 .  gabapentin (NEURONTIN) 300 MG capsule, Take 1 capsule (300 mg total) by mouth 2 (two) times daily., Disp: 60 capsule, Rfl: 6 .  glimepiride (AMARYL) 4 MG tablet, Take 1 tablet (4 mg total) by mouth daily before breakfast., Disp: 30  tablet, Rfl: 6 .  glucose blood (ONE TOUCH ULTRA TEST) test strip, CHECK BLOOD SUGAR TWICE DAILY AS DIRECTED., Disp: , Rfl:  .  hydrochlorothiazide (HYDRODIURIL) 25 MG tablet, Take 1 tablet (25 mg total) by mouth daily., Disp: 30 tablet, Rfl: 6 .  Lancets (ONETOUCH ULTRASOFT) lancets, CHECK BLOOD SUGAR TWICE DAILY AS DIRECTED., Disp: , Rfl:  .  lovastatin (MEVACOR) 40 MG tablet, Take 1 tablet (40 mg total) by mouth at bedtime., Disp: 30 tablet, Rfl: 6 .  metFORMIN (GLUCOPHAGE) 500 MG tablet, Take 1 tablet (500 mg total) by mouth 2 (two) times daily with a meal., Disp: 60 tablet, Rfl: 6 .  mirtazapine (REMERON) 15 MG tablet, TAKE ONE TABLET AT BEDTIME, Disp: , Rfl:  .  oxyCODONE (OXY IR/ROXICODONE) 5 MG immediate release tablet, Take 1-2 tablets (5-10 mg total) by mouth every 4 (four) hours as needed for severe pain., Disp: 60 tablet, Rfl: 0 .  traZODone (DESYREL) 100 MG tablet, TAKE ONE TABLET AT BEDTIME, Disp: , Rfl:  .  lamoTRIgine (LAMICTAL) 100 MG tablet, Take by mouth., Disp: , Rfl:   Allergies  Allergen Reactions  . Haloperidol     Other reaction(s): Unknown  . Sulfa Antibiotics     Other reaction(s): OTHER  Review of Systems  Constitutional: Negative for fever, chills, weight loss and malaise/fatigue.  HENT: Negative for hearing loss.   Eyes: Negative for blurred vision and double vision.  Respiratory: Negative for cough, shortness of breath and wheezing.   Cardiovascular: Negative for chest pain and palpitations.  Gastrointestinal: Negative for heartburn, abdominal pain and blood in stool.  Genitourinary: Negative for dysuria, urgency and frequency.  Musculoskeletal: Positive for joint pain (L ankle sec. to fracture).  Skin: Negative for rash.  Neurological: Negative for dizziness, tremors, weakness and headaches.      Objective  Filed Vitals:   10/04/15 1057  BP: 144/89  Pulse: 88  Resp: 18     Physical Exam  Constitutional: He is oriented to person, place, and  time and well-developed, well-nourished, and in no distress. No distress.  HENT:  Head: Normocephalic and atraumatic.  Eyes: Conjunctivae and EOM are normal. Pupils are equal, round, and reactive to light. No scleral icterus.  Neck: Normal range of motion. Neck supple. No thyromegaly present.  Cardiovascular: Normal rate, regular rhythm and normal heart sounds.  Exam reveals no gallop and no friction rub.   No murmur heard. Pulmonary/Chest: Effort normal and breath sounds normal. No respiratory distress. He has no wheezes. He exhibits no tenderness.  Musculoskeletal: He exhibits edema.  Cast on L ankle  Lymphadenopathy:    He has no cervical adenopathy.  Neurological: He is alert and oriented to person, place, and time.  Vitals reviewed.     Recent Results (from the past 2160 hour(s))  HM DIABETES EYE EXAM     Status: None   Collection Time: 07/15/15 12:00 AM  Result Value Ref Range   HM Diabetic Eye Exam No Retinopathy No Retinopathy  CBC with Differential     Status: None   Collection Time: 08/17/15  9:46 AM  Result Value Ref Range   WBC 8.8 3.4 - 10.8 x10E3/uL   RBC 5.46 4.14 - 5.80 x10E6/uL   Hemoglobin 16.4 12.6 - 17.7 g/dL   Hematocrit 46.4 37.5 - 51.0 %   MCV 85 79 - 97 fL   MCH 30.0 26.6 - 33.0 pg   MCHC 35.3 31.5 - 35.7 g/dL   RDW 14.0 12.3 - 15.4 %   Platelets 277 150 - 379 x10E3/uL   Neutrophils 57 %   Lymphs 28 %   Monocytes 9 %   Eos 4 %   Basos 1 %   Neutrophils Absolute 5.1 1.4 - 7.0 x10E3/uL   Lymphocytes Absolute 2.5 0.7 - 3.1 x10E3/uL   Monocytes Absolute 0.8 0.1 - 0.9 x10E3/uL   EOS (ABSOLUTE) 0.3 0.0 - 0.4 x10E3/uL   Basophils Absolute 0.1 0.0 - 0.2 x10E3/uL   Immature Granulocytes 1 %   Immature Grans (Abs) 0.0 0.0 - 0.1 x10E3/uL  Comprehensive Metabolic Panel (CMET)     Status: Abnormal   Collection Time: 08/17/15  9:46 AM  Result Value Ref Range   Glucose 129 (H) 65 - 99 mg/dL   BUN 15 8 - 27 mg/dL   Creatinine, Ser 1.06 0.76 - 1.27 mg/dL    GFR calc non Af Amer 69 >59 mL/min/1.73   GFR calc Af Amer 80 >59 mL/min/1.73   BUN/Creatinine Ratio 14 10 - 22   Sodium 140 134 - 144 mmol/L   Potassium 4.5 3.5 - 5.2 mmol/L   Chloride 97 96 - 106 mmol/L   CO2 24 18 - 29 mmol/L   Calcium 10.4 (H) 8.6 - 10.2 mg/dL   Total  Protein 7.6 6.0 - 8.5 g/dL   Albumin 4.9 (H) 3.5 - 4.8 g/dL   Globulin, Total 2.7 1.5 - 4.5 g/dL   Albumin/Globulin Ratio 1.8 1.1 - 2.5   Bilirubin Total 0.8 0.0 - 1.2 mg/dL   Alkaline Phosphatase 43 39 - 117 IU/L   AST 24 0 - 40 IU/L   ALT 32 0 - 44 IU/L  Lipid Profile     Status: Abnormal   Collection Time: 08/17/15  9:46 AM  Result Value Ref Range   Cholesterol, Total 195 100 - 199 mg/dL   Triglycerides 281 (H) 0 - 149 mg/dL   HDL 46 >39 mg/dL   VLDL Cholesterol Cal 56 (H) 5 - 40 mg/dL   LDL Calculated 93 0 - 99 mg/dL   Chol/HDL Ratio 4.2 0.0 - 5.0 ratio units    Comment:                                   T. Chol/HDL Ratio                                             Men  Women                               1/2 Avg.Risk  3.4    3.3                                   Avg.Risk  5.0    4.4                                2X Avg.Risk  9.6    7.1                                3X Avg.Risk 23.4   11.0   HgB A1c     Status: Abnormal   Collection Time: 08/17/15  9:46 AM  Result Value Ref Range   Hgb A1c MFr Bld 6.3 (H) 4.8 - 5.6 %    Comment:          Pre-diabetes: 5.7 - 6.4          Diabetes: >6.4          Glycemic control for adults with diabetes: <7.0    Est. average glucose Bld gHb Est-mCnc 134 mg/dL  CBC with Differential     Status: None   Collection Time: 09/26/15  4:24 PM  Result Value Ref Range   WBC 8.5 3.8 - 10.6 K/uL   RBC 5.11 4.40 - 5.90 MIL/uL   Hemoglobin 15.1 13.0 - 18.0 g/dL   HCT 43.9 40.0 - 52.0 %   MCV 86.0 80.0 - 100.0 fL   MCH 29.5 26.0 - 34.0 pg   MCHC 34.3 32.0 - 36.0 g/dL   RDW 13.7 11.5 - 14.5 %   Platelets 224 150 - 440 K/uL   Neutrophils Relative % 74 %   Neutro Abs 6.3 1.4  - 6.5 K/uL   Lymphocytes Relative 16 %   Lymphs Abs 1.4 1.0 -  3.6 K/uL   Monocytes Relative 7 %   Monocytes Absolute 0.6 0.2 - 1.0 K/uL   Eosinophils Relative 2 %   Eosinophils Absolute 0.2 0 - 0.7 K/uL   Basophils Relative 1 %   Basophils Absolute 0.1 0 - 0.1 K/uL  Basic metabolic panel     Status: Abnormal   Collection Time: 09/26/15  4:24 PM  Result Value Ref Range   Sodium 139 135 - 145 mmol/L   Potassium 4.3 3.5 - 5.1 mmol/L    Comment: HEMOLYSIS AT THIS LEVEL MAY AFFECT RESULT   Chloride 106 101 - 111 mmol/L   CO2 22 22 - 32 mmol/L   Glucose, Bld 141 (H) 65 - 99 mg/dL   BUN 23 (H) 6 - 20 mg/dL   Creatinine, Ser 0.99 0.61 - 1.24 mg/dL   Calcium 9.4 8.9 - 10.3 mg/dL   GFR calc non Af Amer >60 >60 mL/min   GFR calc Af Amer >60 >60 mL/min    Comment: (NOTE) The eGFR has been calculated using the CKD EPI equation. This calculation has not been validated in all clinical situations. eGFR's persistently <60 mL/min signify possible Chronic Kidney Disease.    Anion gap 11 5 - 15  Surgical pcr screen     Status: None   Collection Time: 09/26/15  9:14 PM  Result Value Ref Range   MRSA, PCR NEGATIVE NEGATIVE   Staphylococcus aureus NEGATIVE NEGATIVE    Comment:        The Xpert SA Assay (FDA approved for NASAL specimens in patients over 71 years of age), is one component of a comprehensive surveillance program.  Test performance has been validated by [facility] for patients greater than or equal to 29 year old. It is not intended to diagnose infection nor to guide or monitor treatment.   Glucose, capillary     Status: None   Collection Time: 09/26/15  9:27 PM  Result Value Ref Range   Glucose-Capillary 98 65 - 99 mg/dL   Comment 1 Notify RN   Glucose, capillary     Status: Abnormal   Collection Time: 09/27/15  7:24 AM  Result Value Ref Range   Glucose-Capillary 144 (H) 65 - 99 mg/dL   Comment 1 Notify RN   Glucose, capillary     Status: Abnormal   Collection Time:  09/27/15 11:09 AM  Result Value Ref Range   Glucose-Capillary 120 (H) 65 - 99 mg/dL   Comment 1 Notify RN   Glucose, capillary     Status: Abnormal   Collection Time: 09/27/15  6:07 PM  Result Value Ref Range   Glucose-Capillary 126 (H) 65 - 99 mg/dL  Basic metabolic panel     Status: Abnormal   Collection Time: 09/27/15  7:05 PM  Result Value Ref Range   Sodium 141 135 - 145 mmol/L   Potassium 4.9 3.5 - 5.1 mmol/L   Chloride 111 101 - 111 mmol/L   CO2 25 22 - 32 mmol/L   Glucose, Bld 155 (H) 65 - 99 mg/dL   BUN 12 6 - 20 mg/dL   Creatinine, Ser 1.00 0.61 - 1.24 mg/dL   Calcium 8.3 (L) 8.9 - 10.3 mg/dL   GFR calc non Af Amer >60 >60 mL/min   GFR calc Af Amer >60 >60 mL/min    Comment: (NOTE) The eGFR has been calculated using the CKD EPI equation. This calculation has not been validated in all clinical situations. eGFR's persistently <60 mL/min signify possible Chronic  Kidney Disease.    Anion gap 5 5 - 15  Glucose, capillary     Status: Abnormal   Collection Time: 09/27/15  8:59 PM  Result Value Ref Range   Glucose-Capillary 142 (H) 65 - 99 mg/dL   Comment 1 Notify RN   CBC with Differential/Platelet     Status: Abnormal   Collection Time: 09/28/15  5:33 AM  Result Value Ref Range   WBC 10.0 3.8 - 10.6 K/uL   RBC 4.23 (L) 4.40 - 5.90 MIL/uL   Hemoglobin 12.6 (L) 13.0 - 18.0 g/dL   HCT 37.3 (L) 40.0 - 52.0 %   MCV 88.2 80.0 - 100.0 fL   MCH 29.8 26.0 - 34.0 pg   MCHC 33.8 32.0 - 36.0 g/dL   RDW 13.5 11.5 - 14.5 %   Platelets 172 150 - 440 K/uL   Neutrophils Relative % 71 %   Neutro Abs 7.2 (H) 1.4 - 6.5 K/uL   Lymphocytes Relative 15 %   Lymphs Abs 1.5 1.0 - 3.6 K/uL   Monocytes Relative 11 %   Monocytes Absolute 1.1 (H) 0.2 - 1.0 K/uL   Eosinophils Relative 2 %   Eosinophils Absolute 0.2 0 - 0.7 K/uL   Basophils Relative 1 %   Basophils Absolute 0.1 0 - 0.1 K/uL  Basic metabolic panel     Status: Abnormal   Collection Time: 09/28/15  5:33 AM  Result Value Ref  Range   Sodium 140 135 - 145 mmol/L   Potassium 4.5 3.5 - 5.1 mmol/L   Chloride 112 (H) 101 - 111 mmol/L   CO2 23 22 - 32 mmol/L   Glucose, Bld 174 (H) 65 - 99 mg/dL   BUN 14 6 - 20 mg/dL   Creatinine, Ser 1.08 0.61 - 1.24 mg/dL   Calcium 7.9 (L) 8.9 - 10.3 mg/dL   GFR calc non Af Amer >60 >60 mL/min   GFR calc Af Amer >60 >60 mL/min    Comment: (NOTE) The eGFR has been calculated using the CKD EPI equation. This calculation has not been validated in all clinical situations. eGFR's persistently <60 mL/min signify possible Chronic Kidney Disease.    Anion gap 5 5 - 15  Glucose, capillary     Status: Abnormal   Collection Time: 09/28/15  7:46 AM  Result Value Ref Range   Glucose-Capillary 174 (H) 65 - 99 mg/dL   Comment 1 Notify RN   Glucose, capillary     Status: Abnormal   Collection Time: 09/28/15 10:57 AM  Result Value Ref Range   Glucose-Capillary 161 (H) 65 - 99 mg/dL   Comment 1 Notify RN   Glucose, capillary     Status: Abnormal   Collection Time: 09/28/15  4:00 PM  Result Value Ref Range   Glucose-Capillary 161 (H) 65 - 99 mg/dL   Comment 1 Notify RN   Glucose, capillary     Status: Abnormal   Collection Time: 09/28/15  8:56 PM  Result Value Ref Range   Glucose-Capillary 155 (H) 65 - 99 mg/dL  Basic metabolic panel     Status: Abnormal   Collection Time: 09/29/15  5:07 AM  Result Value Ref Range   Sodium 137 135 - 145 mmol/L   Potassium 4.0 3.5 - 5.1 mmol/L   Chloride 107 101 - 111 mmol/L   CO2 23 22 - 32 mmol/L   Glucose, Bld 163 (H) 65 - 99 mg/dL   BUN 18 6 - 20 mg/dL   Creatinine,  Ser 1.01 0.61 - 1.24 mg/dL   Calcium 8.3 (L) 8.9 - 10.3 mg/dL   GFR calc non Af Amer >60 >60 mL/min   GFR calc Af Amer >60 >60 mL/min    Comment: (NOTE) The eGFR has been calculated using the CKD EPI equation. This calculation has not been validated in all clinical situations. eGFR's persistently <60 mL/min signify possible Chronic Kidney Disease.    Anion gap 7 5 - 15   Glucose, capillary     Status: Abnormal   Collection Time: 09/29/15  7:47 AM  Result Value Ref Range   Glucose-Capillary 155 (H) 65 - 99 mg/dL  Glucose, capillary     Status: Abnormal   Collection Time: 09/29/15 11:18 AM  Result Value Ref Range   Glucose-Capillary 159 (H) 65 - 99 mg/dL  POCT HgB A1C     Status: Normal   Collection Time: 10/04/15 11:12 AM  Result Value Ref Range   Hemoglobin A1C 6.3%      Assessment & Plan  1. Type 2 diabetes mellitus without complication, unspecified long term insulin use status (HCC) -cont Metformin 500 mg., 1 twice a day. Stop Glimeperide. - POCT HgB A1C-6.3 - Insulin Glargine (LANTUS SOLOSTAR) 100 UNIT/ML Solostar Pen; Inject 5 Units into the skin daily at 10 pm.  Dispense: 5 pen; Refill: PRN -call Thursday, 10/07/15 to report his blood sugars. 2. Type 2 diabetes mellitus without complication, without long-term current use of insulin ([place])

## 2015-10-04 NOTE — Patient Instructions (Addendum)
Call blood sugars on Thurs. To adjust Lantus if needed.  To see MD at Dr. Rance Muir office on 10/18/15.

## 2015-10-06 ENCOUNTER — Telehealth: Payer: Self-pay | Admitting: Family Medicine

## 2015-10-06 NOTE — Telephone Encounter (Signed)
Pt called with sugar readings.  He will call in the morning with more readings.  His call back number is [phone]           Tuesday 6 am 178           7:30 am 201         11:45 am 227           6:19 pm 158   Wednesday 7:51 am 214          12:34 pm 262            4:14 pm 249

## 2015-10-07 ENCOUNTER — Telehealth: Payer: Self-pay | Admitting: Family Medicine

## 2015-10-07 NOTE — Telephone Encounter (Signed)
I am not sure why this was routed to me. I am routing this to the clinical pool. Thanks! AK

## 2015-10-07 NOTE — Telephone Encounter (Signed)
Patient has been advised to increase Lantus to 8 units and continue to check bs. Patient will call back on Monday.

## 2015-10-07 NOTE — Telephone Encounter (Signed)
Have him increase his Lantus to 8 units each evening and continue to check sugars and call results on Monday too see if more adjustment needed.-jh

## 2015-10-07 NOTE — Telephone Encounter (Signed)
Previous note applies.  Increase Lantus to 8 units each evening and have him continue to check BSs and call results Monday, 10/11/15.-jh

## 2015-10-07 NOTE — Telephone Encounter (Signed)
Advised to call Monday with readings.

## 2015-10-07 NOTE — Telephone Encounter (Signed)
Pt call with blood sugar  Readings pt call back # is  [phone]    2/21  178                  5.55 am          201                  7:25 am           227                 11:44pm          158                   6:19pm   2/22   214                 7:51am           262                 12:34pm           249                 4:14pm           293                 5:39pm  2/23   233                7:20am

## 2015-10-07 NOTE — Telephone Encounter (Signed)
Advised and he will keep checking and stay at 8.

## 2015-10-07 NOTE — Telephone Encounter (Signed)
Patient does not feel that 8 units is enough to get these numbers to normal. He states that he will check these daily and if no significant increase he is going

## 2015-10-11 ENCOUNTER — Telehealth: Payer: Self-pay | Admitting: Family Medicine

## 2015-10-11 NOTE — Telephone Encounter (Signed)
Pt called with blood sugar readings.  He said the reading were consistently high.  Please call [phone]  Friday 2/24     7 am     242                         4:43 pm     316    8 pm     331  Saturday         7:30 am     248    12 pm     299    6 pm     283  Sunday 7 am     283    3:48 pm     268  Monday 8:17 am     28 3

## 2015-10-11 NOTE — Telephone Encounter (Signed)
Per Dr. Luan Pulling, Patient is to increase Lantus to 12 units and call back on Thursday.

## 2015-10-14 ENCOUNTER — Telehealth: Payer: Self-pay | Admitting: Family Medicine

## 2015-10-14 NOTE — Telephone Encounter (Signed)
He can increase his Lantus to 15 units each day and call sugars on Monday.  I would recommend that he reschedule his appt at [facility] office sooner that this date, as I had planned to care for him only until his March 6 appt., and do not feel obligated to continue his care here much past that date.-jh

## 2015-10-14 NOTE — Telephone Encounter (Signed)
BS readings are on previous message. FYI patient cancelled and rescheduled [facility] appt to 12/07/15.

## 2015-10-14 NOTE — Telephone Encounter (Signed)
Pt called in daily readings.  He said he is thirsty.  Feb 28th 8am 240    12p 224    6p 229  Mar 1st 9a 235    5p 293    6p 276  Mar 2nd 8a 227

## 2015-10-15 NOTE — Telephone Encounter (Signed)
Spoke with patient on 10/15/15 and advised him to increase his lantus to 15 units daily.  He is to call back Monday with BS logs.  Patient was advised to call back to [facility] and try to get earlier appt then 11-2015.  Patient was told that he should have kept appt with [facility] on 10/18/15 to follow up on DM.  This was discussed with patient at last visit with Dr. Luan Pulling.  At last visit patient expressed that he was not coming back to [facility] because he was not happy with Dr. Luan Pulling and had heard things about the doctor that he did not like.  Again Patient was advised to keep appt with [facility] to est care.

## 2015-10-15 NOTE — Telephone Encounter (Signed)
Patient advised by AM.

## 2015-10-18 ENCOUNTER — Ambulatory Visit: Payer: Medicare Other | Admitting: Family Medicine

## 2015-10-18 ENCOUNTER — Telehealth: Payer: Self-pay | Admitting: Family Medicine

## 2015-10-18 NOTE — Telephone Encounter (Signed)
Confirm that his sugar reading was not 23 on Sat at 7:10.  If 23?, then have him increase his Lantus to 18 units each evening.  Also confirm when he has appt. At Dr. Rance [facility] office.

## 2015-10-18 NOTE — Telephone Encounter (Signed)
Pt called with sugar readings  Sat 3/4  7am 183    530 p 305    710 p 23  Sunday 756a 180    1157a 167    602p 249  Monday 710a 236

## 2015-10-18 NOTE — Telephone Encounter (Signed)
Patient aware to increase Lantus to 18 units.

## 2015-10-25 ENCOUNTER — Telehealth: Payer: Self-pay | Admitting: Family Medicine

## 2015-10-25 NOTE — Telephone Encounter (Signed)
Pt called with  Readings :    Tues 7th  7:38 am 209              Wed   8th  6:57 am 183                   5:35 pm 305                   7:10pm 234  Thurs 9th  7:56am 180                  11:57 pm 167                   6:02pm  249 Fri  10 th    7:10 am 236                  12:45 pm 245  Sat 11 th   6:49am 153                  12:46pm 214                   5:01 pm 295  Sun 12th   8:52 am 200                   6:14 pm 294

## 2015-10-25 NOTE — Telephone Encounter (Signed)
Have him increase Lantus to 22 units each day.  Call in sugars in 1 week.  See MD at Dr. Rance [facility]ir office ASAP.-jh

## 2015-10-26 NOTE — Telephone Encounter (Signed)
Patient aware.[place] 

## 2015-11-01 ENCOUNTER — Telehealth: Payer: Self-pay | Admitting: Family Medicine

## 2015-11-01 NOTE — Telephone Encounter (Signed)
Pt called with readings:  3-14 8:00 am 194         5:22 pm  241         7:31 pm   224  3-15 10:00am 211  7:09 pm 121  3-16  7:55 am 201           7:02 pm 131  3-17 10:55 am 175           5:15 pm 162  3-18 1:32 am 133            3-19 7:25 am 92          3:35 pm  191  3-20 7:40 am 153

## 2015-11-01 NOTE — Telephone Encounter (Signed)
Sugars the last 3 days are doing pretty well.  Have him continue the same cdose of Lantus (22 units) and continue this until he sees new MD at [facility].-jh

## 2015-11-02 NOTE — Telephone Encounter (Signed)
He can call any time he pleases, but I will make decision re Lantus based on best judgement.-jh

## 2015-11-02 NOTE — Telephone Encounter (Signed)
Patient states BS to high when he is healing from broken leg. He said anything above 150-171 he will be concerned about and given he does not see [facility] until 4/29 he has decided he WILL call in any BS over 170. [facility]

## 2015-11-10 ENCOUNTER — Telehealth: Payer: Self-pay | Admitting: Family Medicine

## 2015-11-10 DIAGNOSIS — E119 Type 2 diabetes mellitus without complications: Secondary | ICD-10-CM

## 2015-11-10 NOTE — Telephone Encounter (Signed)
Pt said pharmacy was unaware of the increase in his insulin for 5 to 22 units.  His call back number is [phone]

## 2015-11-11 MED ORDER — INSULIN GLARGINE 100 UNIT/ML SOLOSTAR PEN: 22.0000 [IU] | PEN_INJECTOR | Freq: Every day | SUBCUTANEOUS | Status: DC

## 2015-11-11 NOTE — Telephone Encounter (Signed)
Chart has been updated to 22 units. Patient is pleased with readings.

## 2015-11-15 DIAGNOSIS — W108XXD Fall (on) (from) other stairs and steps, subsequent encounter: Secondary | ICD-10-CM | POA: Diagnosis not present

## 2015-11-15 DIAGNOSIS — I10 Essential (primary) hypertension: Secondary | ICD-10-CM | POA: Diagnosis not present

## 2015-11-15 DIAGNOSIS — Z85828 Personal history of other malignant neoplasm of skin: Secondary | ICD-10-CM | POA: Diagnosis not present

## 2015-11-15 DIAGNOSIS — Z9181 History of falling: Secondary | ICD-10-CM | POA: Diagnosis not present

## 2015-11-15 DIAGNOSIS — Z87442 Personal history of urinary calculi: Secondary | ICD-10-CM | POA: Diagnosis not present

## 2015-11-15 DIAGNOSIS — E1142 Type 2 diabetes mellitus with diabetic polyneuropathy: Secondary | ICD-10-CM | POA: Diagnosis not present

## 2015-11-15 DIAGNOSIS — Z87891 Personal history of nicotine dependence: Secondary | ICD-10-CM | POA: Diagnosis not present

## 2015-11-15 DIAGNOSIS — E785 Hyperlipidemia, unspecified: Secondary | ICD-10-CM | POA: Diagnosis not present

## 2015-11-15 DIAGNOSIS — S82852D Displaced trimalleolar fracture of left lower leg, subsequent encounter for closed fracture with routine healing: Secondary | ICD-10-CM | POA: Diagnosis not present

## 2015-11-15 DIAGNOSIS — Z8551 Personal history of malignant neoplasm of bladder: Secondary | ICD-10-CM | POA: Diagnosis not present

## 2015-11-17 DIAGNOSIS — Z85828 Personal history of other malignant neoplasm of skin: Secondary | ICD-10-CM | POA: Diagnosis not present

## 2015-11-17 DIAGNOSIS — E1142 Type 2 diabetes mellitus with diabetic polyneuropathy: Secondary | ICD-10-CM | POA: Diagnosis not present

## 2015-11-17 DIAGNOSIS — E785 Hyperlipidemia, unspecified: Secondary | ICD-10-CM | POA: Diagnosis not present

## 2015-11-17 DIAGNOSIS — W108XXD Fall (on) (from) other stairs and steps, subsequent encounter: Secondary | ICD-10-CM | POA: Diagnosis not present

## 2015-11-17 DIAGNOSIS — S82852D Displaced trimalleolar fracture of left lower leg, subsequent encounter for closed fracture with routine healing: Secondary | ICD-10-CM | POA: Diagnosis not present

## 2015-11-17 DIAGNOSIS — Z8551 Personal history of malignant neoplasm of bladder: Secondary | ICD-10-CM | POA: Diagnosis not present

## 2015-11-17 DIAGNOSIS — Z87442 Personal history of urinary calculi: Secondary | ICD-10-CM | POA: Diagnosis not present

## 2015-11-17 DIAGNOSIS — Z87891 Personal history of nicotine dependence: Secondary | ICD-10-CM | POA: Diagnosis not present

## 2015-11-17 DIAGNOSIS — I10 Essential (primary) hypertension: Secondary | ICD-10-CM | POA: Diagnosis not present

## 2015-11-17 DIAGNOSIS — Z9181 History of falling: Secondary | ICD-10-CM | POA: Diagnosis not present

## 2015-11-19 DIAGNOSIS — M25572 Pain in left ankle and joints of left foot: Secondary | ICD-10-CM | POA: Diagnosis not present

## 2015-11-23 ENCOUNTER — Other Ambulatory Visit: Payer: Self-pay | Admitting: *Deleted

## 2015-11-23 MED ORDER — GLUCOSE BLOOD VI STRP: 1.0000 | ORAL_STRIP | Freq: Three times a day (TID) | Status: DC

## 2015-11-25 ENCOUNTER — Telehealth: Payer: Self-pay | Admitting: *Deleted

## 2015-11-25 NOTE — Telephone Encounter (Signed)
Tried to call patient and confirm his appt with [facility] on 12/07/15.  Pt has appt here and there.

## 2015-11-29 DIAGNOSIS — E1142 Type 2 diabetes mellitus with diabetic polyneuropathy: Secondary | ICD-10-CM | POA: Diagnosis not present

## 2015-11-29 DIAGNOSIS — Z87891 Personal history of nicotine dependence: Secondary | ICD-10-CM | POA: Diagnosis not present

## 2015-11-29 DIAGNOSIS — Z8551 Personal history of malignant neoplasm of bladder: Secondary | ICD-10-CM | POA: Diagnosis not present

## 2015-11-29 DIAGNOSIS — S82852D Displaced trimalleolar fracture of left lower leg, subsequent encounter for closed fracture with routine healing: Secondary | ICD-10-CM | POA: Diagnosis not present

## 2015-11-29 DIAGNOSIS — E785 Hyperlipidemia, unspecified: Secondary | ICD-10-CM | POA: Diagnosis not present

## 2015-11-29 DIAGNOSIS — I10 Essential (primary) hypertension: Secondary | ICD-10-CM | POA: Diagnosis not present

## 2015-11-29 DIAGNOSIS — Z7984 Long term (current) use of oral hypoglycemic drugs: Secondary | ICD-10-CM | POA: Diagnosis not present

## 2015-12-01 DIAGNOSIS — S82852D Displaced trimalleolar fracture of left lower leg, subsequent encounter for closed fracture with routine healing: Secondary | ICD-10-CM | POA: Diagnosis not present

## 2015-12-01 DIAGNOSIS — E1142 Type 2 diabetes mellitus with diabetic polyneuropathy: Secondary | ICD-10-CM | POA: Diagnosis not present

## 2015-12-01 DIAGNOSIS — Z87891 Personal history of nicotine dependence: Secondary | ICD-10-CM | POA: Diagnosis not present

## 2015-12-01 DIAGNOSIS — I10 Essential (primary) hypertension: Secondary | ICD-10-CM | POA: Diagnosis not present

## 2015-12-01 DIAGNOSIS — Z8551 Personal history of malignant neoplasm of bladder: Secondary | ICD-10-CM | POA: Diagnosis not present

## 2015-12-01 DIAGNOSIS — Z7984 Long term (current) use of oral hypoglycemic drugs: Secondary | ICD-10-CM | POA: Diagnosis not present

## 2015-12-01 DIAGNOSIS — E785 Hyperlipidemia, unspecified: Secondary | ICD-10-CM | POA: Diagnosis not present

## 2015-12-06 DIAGNOSIS — S82852D Displaced trimalleolar fracture of left lower leg, subsequent encounter for closed fracture with routine healing: Secondary | ICD-10-CM | POA: Diagnosis not present

## 2015-12-06 DIAGNOSIS — Z7984 Long term (current) use of oral hypoglycemic drugs: Secondary | ICD-10-CM | POA: Diagnosis not present

## 2015-12-06 DIAGNOSIS — Z8551 Personal history of malignant neoplasm of bladder: Secondary | ICD-10-CM | POA: Diagnosis not present

## 2015-12-06 DIAGNOSIS — E785 Hyperlipidemia, unspecified: Secondary | ICD-10-CM | POA: Diagnosis not present

## 2015-12-06 DIAGNOSIS — E1142 Type 2 diabetes mellitus with diabetic polyneuropathy: Secondary | ICD-10-CM | POA: Diagnosis not present

## 2015-12-06 DIAGNOSIS — I10 Essential (primary) hypertension: Secondary | ICD-10-CM | POA: Diagnosis not present

## 2015-12-06 DIAGNOSIS — Z87891 Personal history of nicotine dependence: Secondary | ICD-10-CM | POA: Diagnosis not present

## 2015-12-07 ENCOUNTER — Encounter: Payer: Self-pay | Admitting: Family Medicine

## 2015-12-07 ENCOUNTER — Ambulatory Visit (INDEPENDENT_AMBULATORY_CARE_PROVIDER_SITE_OTHER): Payer: Medicare Other | Admitting: Family Medicine

## 2015-12-07 ENCOUNTER — Ambulatory Visit: Payer: Medicare Other | Admitting: Family Medicine

## 2015-12-07 VITALS — BP 143/85 | HR 101 | Temp 99.1°F | Ht 67.2 in | Wt 183.0 lb

## 2015-12-07 DIAGNOSIS — Z794 Long term (current) use of insulin: Secondary | ICD-10-CM

## 2015-12-07 DIAGNOSIS — E1142 Type 2 diabetes mellitus with diabetic polyneuropathy: Secondary | ICD-10-CM | POA: Diagnosis not present

## 2015-12-07 DIAGNOSIS — G4700 Insomnia, unspecified: Secondary | ICD-10-CM | POA: Diagnosis not present

## 2015-12-07 MED ORDER — TRAZODONE HCL 100 MG PO TABS: 100.0000 mg | ORAL_TABLET | Freq: Every day | ORAL | Status: DC

## 2015-12-07 MED ORDER — MIRTAZAPINE 15 MG PO TABS: 15.0000 mg | ORAL_TABLET | Freq: Every day | ORAL | Status: DC

## 2015-12-07 NOTE — Assessment & Plan Note (Signed)
Will continue current regimen. Will obtain previous records. Continue to monitor.

## 2015-12-07 NOTE — Assessment & Plan Note (Signed)
A1c at 6.3 prior to titrating up lantus. Patient did not stop glimiperide. Would like to start acarbose and would also like to increase his metformin but does not want to change his current regimen. Long discussion with patient about the dangers of hypoglycemia and the increased theoretical risk of too tight control. He will stop his glimiperide and we will continue on his current doses of lantus and metformin. We will check in on his sugars in 2 weeks and determine if he can come up on his metformin or down on his lantus. Continue to monitor. Call with any concerns.

## 2015-12-07 NOTE — Progress Notes (Signed)
BP 143/85 mmHg  Pulse 101  Temp(Src) 99.1 F (37.3 C)  Ht 5' 7.2" (1.707 m)  Wt 183 lb (83.008 kg)  BMI 28.49 kg/m2  SpO2 97%   Subjective:    Patient ID: Rick [name], male    DOB: 1941-03-19, 75 y.o.   MRN: [id number]  HPI: Rick [name] is a 75 y.o. male here today to establish care  Chief Complaint  Patient presents with  . Insomnia    Patient states that he has a hard time sleeping at night due to his medical problems, he currently takes trazodone and remeron  . Diabetes    Patient is concerned that his a1c is to high at 6.3  . Hernia   Started to see Dr. Luan Pulling in December. Had been seeing Dr. Manuella Ghazi over at [facility].  Has been following with Dr. Luan Pulling and having his lantus titrated up by phone since middle of Feburary. He fractured his ankle in February and had a very high sugars in the hospital. He was adjusting medication on his own at home. Now taking 22 units of lantus daily. Sugars in the hospital appear to have been in the 120s-140s. A1c 6.3 08/17/15. Was taken off his glimeperide and started on lantus.   DIABETES- taking the glypermide despite being instructed not to take it Hypoglycemic episodes:no Polydipsia/polyuria: no Visual disturbance: no Chest pain: no Paresthesias: yes Glucose Monitoring: yes  Accucheck frequency: TID  Fasting glucose: 70s  Post prandial: 160 Taking Insulin?: yes  Long acting insulin: yes  Short acting insulin: no Blood Pressure Monitoring: not checking Retinal Examination: Up to Date Foot Exam: Up to Date Diabetic Education: Completed Pneumovax: Not up to Date Influenza: Up to Date Aspirin: no   Doing well on his sleeping medication.    Active Ambulatory Problems    Diagnosis Date Noted  . Mass of arm 02/10/2015  . History of abdominal hernia 02/10/2015  . Anxiety and depression 02/10/2015  . Anxiety, generalized 05/18/2014  . Ear problem 03/24/2015  . Affective bipolar disorder ([place]) 03/24/2015  .  Diabetic peripheral neuropathy associated with type 2 diabetes mellitus ([place]) 03/24/2015  . Insomnia 03/24/2015  . Hypertension 05/11/2015  . Hyperlipidemia 05/11/2015  . Left hand pain 05/18/2015  . Chest pain, atypical 06/23/2015  . Balanoposthitis 10/31/2012  . Benign prostatic hyperplasia with urinary obstruction 10/31/2012  . Malignant neoplasm of urinary bladder ([place]) 08/05/2015  . Benign fibroma of prostate 08/05/2015  . Chronic prostatitis 06/12/2013  . Difficult or painful urination 10/31/2012  . Herpesviral infection of penis 11/07/2012  . History of alcoholism ([place]) 08/05/2015  . History of migraine headaches 08/05/2015  . Impacted cerumen of left ear 08/05/2015  . ED (erectile dysfunction) of organic origin 10/31/2012  . Incomplete bladder emptying 11/07/2012  . 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 Mastoiditis 08/05/2015  . Calculus of kidney 08/05/2015  . Disorder of peripheral nervous system ([place]) 08/05/2015  . Skin lesion 08/05/2015  . H/O urinary disorder 03/27/2013  . Superficial acne vulgaris 08/05/2015  . Back pain, thoracic 08/05/2015  . Diabetes mellitus, type 2 ([place]) 08/05/2015  . UD (urethral discharge) 10/31/2012  . Ankle fracture, left 09/26/2015  . Type 2 diabetes mellitus ([place]) 10/04/2015   Resolved Ambulatory Problems    Diagnosis Date Noted  . Diabetes mellitus type 2, controlled, with complications ([place]) [id number]  . Diabetes mellitus, type 2 ([place]) 03/24/2015  . Need for immunization against influenza 05/11/2015  . Encounter to establish care 08/05/2015  Past Medical History  Diagnosis Date  . Diabetes mellitus without complication ([place])   . BPH (benign prostatic hypertrophy)   . Bipolar disorder ([place])   . Peripheral neuropathy ([place])   . Skin cancer   . Bladder cancer ([place])   . Gangrenous appendicitis    Past Surgical History  Procedure Laterality Date  . Appendectomy    . Cholecystectomy    . Skin cancer excision    .  Bladder surgery    . Orif ankle fracture Left 09/27/2015    Procedure: OPEN REDUCTION INTERNAL FIXATION (ORIF) ANKLE FRACTURE;  Surgeon: Corky Mull, MD;  Location: [facility] ORS;  Service: Orthopedics;  Laterality: Left;  . Inner ear surgery Left   . Hernia repair     Allergies  Allergen Reactions  . Haloperidol     Other reaction(s): Unknown  . Sulfa Antibiotics     Other reaction(s): OTHER   Social History   Social History  . Marital Status: Divorced    Spouse Name: N/A  . Number of Children: N/A  . Years of Education: N/A   Social History Main Topics  . Smoking status: Former Smoker -- 10 years    Types: Cigarettes    Start date: 08/14/1994    Quit date: 03/23/2005  . Smokeless tobacco: Former Systems developer    Types: Snuff, Chew    Quit date: 03/23/2010  . Alcohol Use: No  . Drug Use: No  . Sexual Activity: No   Other Topics Concern  . None   Social History Narrative   Family History  Problem Relation Age of Onset  . Transient ischemic attack Mother   . Diabetes Father   . Cancer Brother    Review of Systems  Constitutional: Negative.   Respiratory: Negative.   Cardiovascular: Negative.   Neurological: Positive for numbness.  Psychiatric/Behavioral: Negative.     Per HPI unless specifically indicated above     Objective:    BP 143/85 mmHg  Pulse 101  Temp(Src) 99.1 F (37.3 C)  Ht 5' 7.2" (1.707 m)  Wt 183 lb (83.008 kg)  BMI 28.49 kg/m2  SpO2 97%  Wt Readings from Last 3 Encounters:  12/07/15 183 lb (83.008 kg)  09/26/15 192 lb 3.2 oz (87.181 kg)  08/05/15 187 lb 9.6 oz (85.095 kg)    Physical Exam  Constitutional: He is oriented to person, place, and time. He appears well-developed and well-nourished. No distress.  HENT:  Head: Normocephalic and atraumatic.  Right Ear: Hearing normal.  Left Ear: Hearing normal.  Nose: Nose normal.  Eyes: Conjunctivae and lids are normal. Right eye exhibits no discharge. Left eye exhibits no discharge. No scleral  icterus.  Pulmonary/Chest: Effort normal. No respiratory distress.  Musculoskeletal: Normal range of motion.  Neurological: He is alert and oriented to person, place, and time.  Skin: Skin is warm, dry and intact. No rash noted. No erythema. No pallor.  Psychiatric: Judgment and thought content normal. His affect is labile and inappropriate. His speech is rapid and/or pressured and tangential. He is agitated. Cognition and memory are normal.  Nursing note and vitals reviewed.   Results for orders placed or performed in visit on 10/04/15  POCT HgB A1C  Result Value Ref Range   Hemoglobin A1C 6.3%       Assessment & Plan:   Problem List Items Addressed This Visit      Endocrine   Type 2 diabetes mellitus ([place]) - Primary     Nervous and Auditory  Diabetic peripheral neuropathy associated with type 2 diabetes mellitus (HCC)    A1c at 6.3 prior to titrating up lantus. Patient did not stop glimiperide. Would like to start acarbose and would also like to increase his metformin but does not want to change his current regimen. Long discussion with patient about the dangers of hypoglycemia and the increased theoretical risk of too tight control. He will stop his glimiperide and we will continue on his current doses of lantus and metformin. We will check in on his sugars in 2 weeks and determine if he can come up on his metformin or down on his lantus. Continue to monitor. Call with any concerns.       Relevant Medications   traZODone (DESYREL) 100 MG tablet   mirtazapine (REMERON) 15 MG tablet     Other   Insomnia    Will continue current regimen. Will obtain previous records. Continue to monitor.           Follow up plan: Return in about 2 weeks (around 12/21/2015) for Discuss hernias and go over blood sugars.

## 2015-12-08 DIAGNOSIS — Z87891 Personal history of nicotine dependence: Secondary | ICD-10-CM | POA: Diagnosis not present

## 2015-12-08 DIAGNOSIS — Z7984 Long term (current) use of oral hypoglycemic drugs: Secondary | ICD-10-CM | POA: Diagnosis not present

## 2015-12-08 DIAGNOSIS — Z8551 Personal history of malignant neoplasm of bladder: Secondary | ICD-10-CM | POA: Diagnosis not present

## 2015-12-08 DIAGNOSIS — E785 Hyperlipidemia, unspecified: Secondary | ICD-10-CM | POA: Diagnosis not present

## 2015-12-08 DIAGNOSIS — E1142 Type 2 diabetes mellitus with diabetic polyneuropathy: Secondary | ICD-10-CM | POA: Diagnosis not present

## 2015-12-08 DIAGNOSIS — I10 Essential (primary) hypertension: Secondary | ICD-10-CM | POA: Diagnosis not present

## 2015-12-08 DIAGNOSIS — S82852D Displaced trimalleolar fracture of left lower leg, subsequent encounter for closed fracture with routine healing: Secondary | ICD-10-CM | POA: Diagnosis not present

## 2015-12-15 DIAGNOSIS — S82852D Displaced trimalleolar fracture of left lower leg, subsequent encounter for closed fracture with routine healing: Secondary | ICD-10-CM | POA: Diagnosis not present

## 2015-12-15 DIAGNOSIS — I10 Essential (primary) hypertension: Secondary | ICD-10-CM | POA: Diagnosis not present

## 2015-12-15 DIAGNOSIS — E1142 Type 2 diabetes mellitus with diabetic polyneuropathy: Secondary | ICD-10-CM | POA: Diagnosis not present

## 2015-12-15 DIAGNOSIS — Z8551 Personal history of malignant neoplasm of bladder: Secondary | ICD-10-CM | POA: Diagnosis not present

## 2015-12-15 DIAGNOSIS — E785 Hyperlipidemia, unspecified: Secondary | ICD-10-CM | POA: Diagnosis not present

## 2015-12-15 DIAGNOSIS — Z87891 Personal history of nicotine dependence: Secondary | ICD-10-CM | POA: Diagnosis not present

## 2015-12-15 DIAGNOSIS — Z7984 Long term (current) use of oral hypoglycemic drugs: Secondary | ICD-10-CM | POA: Diagnosis not present

## 2015-12-17 DIAGNOSIS — E785 Hyperlipidemia, unspecified: Secondary | ICD-10-CM | POA: Diagnosis not present

## 2015-12-17 DIAGNOSIS — Z7984 Long term (current) use of oral hypoglycemic drugs: Secondary | ICD-10-CM | POA: Diagnosis not present

## 2015-12-17 DIAGNOSIS — Z8551 Personal history of malignant neoplasm of bladder: Secondary | ICD-10-CM | POA: Diagnosis not present

## 2015-12-17 DIAGNOSIS — I10 Essential (primary) hypertension: Secondary | ICD-10-CM | POA: Diagnosis not present

## 2015-12-17 DIAGNOSIS — S82852D Displaced trimalleolar fracture of left lower leg, subsequent encounter for closed fracture with routine healing: Secondary | ICD-10-CM | POA: Diagnosis not present

## 2015-12-17 DIAGNOSIS — Z87891 Personal history of nicotine dependence: Secondary | ICD-10-CM | POA: Diagnosis not present

## 2015-12-17 DIAGNOSIS — E1142 Type 2 diabetes mellitus with diabetic polyneuropathy: Secondary | ICD-10-CM | POA: Diagnosis not present

## 2015-12-21 ENCOUNTER — Ambulatory Visit (INDEPENDENT_AMBULATORY_CARE_PROVIDER_SITE_OTHER): Payer: Medicare Other | Admitting: Family Medicine

## 2015-12-21 ENCOUNTER — Other Ambulatory Visit: Payer: Self-pay | Admitting: Family Medicine

## 2015-12-21 ENCOUNTER — Encounter: Payer: Self-pay | Admitting: Family Medicine

## 2015-12-21 VITALS — BP 149/86 | HR 70 | Temp 97.8°F | Wt 185.0 lb

## 2015-12-21 DIAGNOSIS — E1142 Type 2 diabetes mellitus with diabetic polyneuropathy: Secondary | ICD-10-CM

## 2015-12-21 DIAGNOSIS — Z85038 Personal history of other malignant neoplasm of large intestine: Secondary | ICD-10-CM | POA: Diagnosis not present

## 2015-12-21 DIAGNOSIS — Z794 Long term (current) use of insulin: Secondary | ICD-10-CM | POA: Diagnosis not present

## 2015-12-21 DIAGNOSIS — K439 Ventral hernia without obstruction or gangrene: Secondary | ICD-10-CM | POA: Insufficient documentation

## 2015-12-21 MED ORDER — INSULIN GLARGINE 100 UNIT/ML SOLOSTAR PEN: 15.0000 [IU] | PEN_INJECTOR | Freq: Every day | SUBCUTANEOUS | Status: DC

## 2015-12-21 MED ORDER — METFORMIN HCL 1000 MG PO TABS: 1000.0000 mg | ORAL_TABLET | Freq: Two times a day (BID) | ORAL | Status: DC

## 2015-12-21 NOTE — Assessment & Plan Note (Signed)
Referral back to GI made today. Continue to monitor.

## 2015-12-21 NOTE — Assessment & Plan Note (Signed)
Will increase metformin to 1000mg  BID and decrease lantus to 15 units daily, will check A1c in 2 weeks, but will call to see how sugars are doing in 1 week and continue to titrate down as needed.

## 2015-12-21 NOTE — Progress Notes (Signed)
BP 149/86 mmHg  Pulse 70  Temp(Src) 97.8 F (36.6 C)  Wt 185 lb (83.915 kg)  SpO2 98%   Subjective:    Patient ID: Rick [name], male    DOB: 12/18/1940, 75 y.o.   MRN: [id number]  HPI: Rick [name] is a 75 y.o. male  Chief Complaint  Patient presents with  . Diabetes    go over sugars  . Hernia    discuss hernias   HERNIA- had surgery done by Dr. Rochel Brome in the past Duration: chronic Location:  Ventral and incisional hernia Painful: no Discomfort: yes Bulge: yes Quality:  aching Onset: gradual Severity: moderate Context: bigger and worse Aggravating factors: nothing  Sugars have been running in the 180s to 210s. Has not noticed too much of a difference since coming off the glimiperide. He thinks that his sugars have come up a little bit but not much.   Relevant past medical, surgical, family and social history reviewed and updated as indicated. Interim medical history since our last visit reviewed. Allergies and medications reviewed and updated.  Review of Systems  Constitutional: Negative.   Respiratory: Negative.   Cardiovascular: Negative.   Gastrointestinal: Positive for abdominal pain and abdominal distention. Negative for nausea, vomiting, diarrhea, constipation, blood in stool, anal bleeding and rectal pain.  Psychiatric/Behavioral: Positive for agitation. Negative for suicidal ideas, hallucinations, behavioral problems, confusion, sleep disturbance, self-injury, dysphoric mood and decreased concentration. The patient is not nervous/anxious and is not hyperactive.     Per HPI unless specifically indicated above     Objective:    BP 149/86 mmHg  Pulse 70  Temp(Src) 97.8 F (36.6 C)  Wt 185 lb (83.915 kg)  SpO2 98%  Wt Readings from Last 3 Encounters:  12/21/15 185 lb (83.915 kg)  12/07/15 183 lb (83.008 kg)  09/26/15 192 lb 3.2 oz (87.181 kg)    Physical Exam  Constitutional: He is oriented to person, place, and time. He appears  well-developed and well-nourished. No distress.  HENT:  Head: Normocephalic and atraumatic.  Right Ear: Hearing normal.  Left Ear: Hearing normal.  Nose: Nose normal.  Eyes: Conjunctivae and lids are normal. Right eye exhibits no discharge. Left eye exhibits no discharge. No scleral icterus.  Cardiovascular: Normal rate, regular rhythm, normal heart sounds and intact distal pulses.  Exam reveals no gallop and no friction rub.   No murmur heard. Pulmonary/Chest: Effort normal and breath sounds normal. No respiratory distress. He has no wheezes. He has no rales. He exhibits no tenderness.  Abdominal: Soft. He exhibits no distension and no mass. There is no tenderness. There is no rebound and no guarding. A hernia (multiple and significant incisional hernias) is present.  Musculoskeletal: Normal range of motion.  Neurological: He is alert and oriented to person, place, and time.  Skin: Skin is intact. No rash noted.  Psychiatric: He has a normal mood and affect. His speech is normal and behavior is normal. Judgment and thought content normal. Cognition and memory are normal.  Nursing note and vitals reviewed.   Results for orders placed or performed in visit on 10/04/15  POCT HgB A1C  Result Value Ref Range   Hemoglobin A1C 6.3%       Assessment & Plan:   Problem List Items Addressed This Visit      Endocrine   Type 2 diabetes mellitus ([place]) - Primary   Relevant Medications   metFORMIN (GLUCOPHAGE) 1000 MG tablet   Insulin Glargine (LANTUS SOLOSTAR) 100 UNIT/ML Solostar  Pen     Other   Ventral hernia   Relevant Orders   Ambulatory referral to General Surgery    Other Visit Diagnoses    History of colon cancer, no staging        Relevant Orders    Ambulatory referral to Gastroenterology        Follow up plan: No Follow-up on file.

## 2015-12-21 NOTE — Assessment & Plan Note (Signed)
Not under good control. Abdominal binder ordered today. Referral to general surgery made today.

## 2015-12-21 NOTE — Patient Instructions (Addendum)
Increase metformin to 2 500mg  pills in the AM and 500mg  in the PM Decrease your lantus to 15 units daily I'll call you on Wednesday to see if we need to change your medicine some more, but if your sugars seem low in the AM- call us.

## 2016-01-03 DIAGNOSIS — S82852D Displaced trimalleolar fracture of left lower leg, subsequent encounter for closed fracture with routine healing: Secondary | ICD-10-CM | POA: Diagnosis not present

## 2016-01-05 ENCOUNTER — Encounter: Payer: Self-pay | Admitting: Family Medicine

## 2016-01-05 ENCOUNTER — Ambulatory Visit (INDEPENDENT_AMBULATORY_CARE_PROVIDER_SITE_OTHER): Payer: Medicare Other | Admitting: Family Medicine

## 2016-01-05 VITALS — BP 136/82 | HR 82 | Temp 97.6°F | Wt 184.0 lb

## 2016-01-05 DIAGNOSIS — A6001 Herpesviral infection of penis: Secondary | ICD-10-CM | POA: Diagnosis not present

## 2016-01-05 DIAGNOSIS — E1142 Type 2 diabetes mellitus with diabetic polyneuropathy: Secondary | ICD-10-CM | POA: Diagnosis not present

## 2016-01-05 DIAGNOSIS — K439 Ventral hernia without obstruction or gangrene: Secondary | ICD-10-CM

## 2016-01-05 DIAGNOSIS — Z794 Long term (current) use of insulin: Secondary | ICD-10-CM

## 2016-01-05 DIAGNOSIS — D1722 Benign lipomatous neoplasm of skin and subcutaneous tissue of left arm: Secondary | ICD-10-CM | POA: Diagnosis not present

## 2016-01-05 LAB — HEMOGLOBIN A1C: HEMOGLOBIN A1C: 6.3

## 2016-01-05 LAB — BAYER DCA HB A1C WAIVED: HB A1C (BAYER DCA - WAIVED): 6.3 % (ref <7.0)

## 2016-01-05 MED ORDER — VALACYCLOVIR HCL 1 G PO TABS: 1000.0000 mg | ORAL_TABLET | Freq: Every day | ORAL | Status: DC | PRN

## 2016-01-05 NOTE — Progress Notes (Signed)
BP 136/82 mmHg  Pulse 82  Temp(Src) 97.6 F (36.4 C)  Wt 184 lb (83.462 kg)  SpO2 96%   Subjective:    Patient ID: Rick [name], male    DOB: Oct 13, 1940, 75 y.o.   MRN: [id number]  HPI: Rick [name] is a 75 y.o. male  Chief Complaint  Patient presents with  . Diabetes  . Medication Refill    he wants to know if he can get a refill on valacyclovir. He was getting it thru Urology, but he is moving.   Saw general surgery this morning. They are planning on hernia repair. They are also going to do lipoma removal in the future in the office. He is happy that they are going to fix the hernia. He is scheduled for that next week. He may have to stay overnight because of the size of the hernia needing repair- they are not sure yet.   DIABETES: last visit 2 weeks ago, his metformin was increased to 1000mg  BID and his lantus was decreased to 15 units daily. He states that his sugars have been running in the 130s-290s. Would like to stay on current regimen until after his surgery and then would like to consider going onto trulicity and getting off his lantus. Will leave everything alone for now and discuss following surgery in about 3-4 weeks.  Needs a refill on his valacyclovir. Was seeing urology, but is moving, so it's hard to get to them. Has been stable with no issues regarding the herpes. Otherwise feeling well with no other concerns at this time.   Relevant past medical, surgical, family and social history reviewed and updated as indicated. Interim medical history since our last visit reviewed. Allergies and medications reviewed and updated.  Review of Systems  Constitutional: Negative.   Respiratory: Negative.   Cardiovascular: Negative.   Psychiatric/Behavioral: Negative.     Per HPI unless specifically indicated above     Objective:    BP 136/82 mmHg  Pulse 82  Temp(Src) 97.6 F (36.4 C)  Wt 184 lb (83.462 kg)  SpO2 96%  Wt Readings from Last 3 Encounters:  01/05/16  184 lb (83.462 kg)  12/21/15 185 lb (83.915 kg)  12/07/15 183 lb (83.008 kg)    Physical Exam  Constitutional: He is oriented to person, place, and time. He appears well-developed and well-nourished. No distress.  HENT:  Head: Normocephalic and atraumatic.  Right Ear: Hearing normal.  Left Ear: Hearing normal.  Nose: Nose normal.  Eyes: Conjunctivae and lids are normal. Right eye exhibits no discharge. Left eye exhibits no discharge. No scleral icterus.  Cardiovascular: Normal rate, regular rhythm, normal heart sounds and intact distal pulses.  Exam reveals no gallop and no friction rub.   No murmur heard. Pulmonary/Chest: Effort normal and breath sounds normal. No respiratory distress. He has no wheezes. He has no rales. He exhibits no tenderness.  Musculoskeletal: Normal range of motion.  Neurological: He is alert and oriented to person, place, and time.  Skin: Skin is warm, dry and intact. No rash noted. No erythema. No pallor.  Psychiatric: He has a normal mood and affect. His speech is normal and behavior is normal. Judgment and thought content normal. Cognition and memory are normal.  Nursing note and vitals reviewed.   Results for orders placed or performed in visit on 10/04/15  POCT HgB A1C  Result Value Ref Range   Hemoglobin A1C 6.3%       Assessment & Plan:   Problem  List Items Addressed This Visit      Nervous and Auditory   Diabetic peripheral neuropathy associated with type 2 diabetes mellitus (HCC)    A1c came back at 6.3 today. Will leave regimen alone today and consider adjusting it in 3-4 weeks following his hernia repair. He would like to come off the insulin. Consider stopping lantus and starting trulicity. Call with any concerns.         Genitourinary   Herpesviral infection of penis    Stable on valacyclovir. Refill given today. Will establish with urology when he has moved.       Relevant Medications   valACYclovir (VALTREX) 1000 MG tablet      Other   Ventral hernia    To be having surgery with Dr. Tamala Julian on June 2nd. Call with any concerns.        Other Visit Diagnoses    Type 2 diabetes mellitus with diabetic polyneuropathy, with long-term current use of insulin ([place])    -  Primary    Relevant Orders    Bayer DCA Hb A1c Waived        Follow up plan: Return 3-4 weeks, for Follow up DM.

## 2016-01-05 NOTE — Assessment & Plan Note (Signed)
A1c came back at 6.3 today. Will leave regimen alone today and consider adjusting it in 3-4 weeks following his hernia repair. He would like to come off the insulin. Consider stopping lantus and starting trulicity. Call with any concerns.

## 2016-01-05 NOTE — Assessment & Plan Note (Signed)
Stable on valacyclovir. Refill given today. Will establish with urology when he has moved.

## 2016-01-05 NOTE — Assessment & Plan Note (Signed)
To be having surgery with Dr. Tamala Julian on June 2nd. Call with any concerns.

## 2016-01-11 ENCOUNTER — Encounter
Admission: RE | Admit: 2016-01-11 | Discharge: 2016-01-11 | Disposition: A | Discharge status: Home / Self Care | Payer: Medicare Other | Source: Ambulatory Visit | Attending: Surgery | Admitting: Surgery

## 2016-01-11 DIAGNOSIS — Z801 Family history of malignant neoplasm of trachea, bronchus and lung: Secondary | ICD-10-CM | POA: Diagnosis not present

## 2016-01-11 DIAGNOSIS — Z823 Family history of stroke: Secondary | ICD-10-CM | POA: Diagnosis not present

## 2016-01-11 DIAGNOSIS — Z882 Allergy status to sulfonamides status: Secondary | ICD-10-CM | POA: Diagnosis not present

## 2016-01-11 DIAGNOSIS — Z79891 Long term (current) use of opiate analgesic: Secondary | ICD-10-CM | POA: Diagnosis not present

## 2016-01-11 DIAGNOSIS — Z794 Long term (current) use of insulin: Secondary | ICD-10-CM | POA: Diagnosis not present

## 2016-01-11 DIAGNOSIS — E119 Type 2 diabetes mellitus without complications: Secondary | ICD-10-CM | POA: Diagnosis not present

## 2016-01-11 DIAGNOSIS — F419 Anxiety disorder, unspecified: Secondary | ICD-10-CM | POA: Diagnosis not present

## 2016-01-11 DIAGNOSIS — Z8551 Personal history of malignant neoplasm of bladder: Secondary | ICD-10-CM | POA: Diagnosis not present

## 2016-01-11 DIAGNOSIS — F329 Major depressive disorder, single episode, unspecified: Secondary | ICD-10-CM | POA: Diagnosis not present

## 2016-01-11 DIAGNOSIS — Z79899 Other long term (current) drug therapy: Secondary | ICD-10-CM | POA: Diagnosis not present

## 2016-01-11 DIAGNOSIS — Z87891 Personal history of nicotine dependence: Secondary | ICD-10-CM | POA: Diagnosis not present

## 2016-01-11 DIAGNOSIS — Z885 Allergy status to narcotic agent status: Secondary | ICD-10-CM | POA: Diagnosis not present

## 2016-01-11 DIAGNOSIS — Z87442 Personal history of urinary calculi: Secondary | ICD-10-CM | POA: Diagnosis not present

## 2016-01-11 DIAGNOSIS — Z9889 Other specified postprocedural states: Secondary | ICD-10-CM | POA: Diagnosis not present

## 2016-01-11 DIAGNOSIS — Z800 Family history of malignant neoplasm of digestive organs: Secondary | ICD-10-CM | POA: Diagnosis not present

## 2016-01-11 DIAGNOSIS — I10 Essential (primary) hypertension: Secondary | ICD-10-CM | POA: Diagnosis not present

## 2016-01-11 DIAGNOSIS — K432 Incisional hernia without obstruction or gangrene: Secondary | ICD-10-CM | POA: Diagnosis not present

## 2016-01-11 HISTORY — DX: Herpesviral infection of other male genital organs: A60.02

## 2016-01-11 HISTORY — DX: Anxiety disorder, unspecified: F41.9

## 2016-01-11 HISTORY — DX: Depression, unspecified: F32.A

## 2016-01-11 HISTORY — DX: Major depressive disorder, single episode, unspecified: F32.9

## 2016-01-11 HISTORY — DX: Cardiac arrhythmia, unspecified: I49.9

## 2016-01-11 HISTORY — DX: Calculus of kidney: N20.0

## 2016-01-11 LAB — CBC
HEMATOCRIT: 43.6 % (ref 40.0–52.0)
Hemoglobin: 14.7 g/dL (ref 13.0–18.0)
MCH: 29.1 pg (ref 26.0–34.0)
MCHC: 33.8 g/dL (ref 32.0–36.0)
MCV: 86 fL (ref 80.0–100.0)
Platelets: 236 10*3/uL (ref 150–440)
RBC: 5.07 MIL/uL (ref 4.40–5.90)
RDW: 14.4 % (ref 11.5–14.5)
WBC: 8.7 10*3/uL (ref 3.8–10.6)

## 2016-01-11 LAB — COMPREHENSIVE METABOLIC PANEL
ALBUMIN: 4.9 g/dL (ref 3.5–5.0)
ALT: 24 U/L (ref 17–63)
ANION GAP: 10 (ref 5–15)
AST: 24 U/L (ref 15–41)
Alkaline Phosphatase: 44 U/L (ref 38–126)
BILIRUBIN TOTAL: 1 mg/dL (ref 0.3–1.2)
BUN: 21 mg/dL — ABNORMAL HIGH (ref 6–20)
CO2: 26 mmol/L (ref 22–32)
Calcium: 9.7 mg/dL (ref 8.9–10.3)
Chloride: 102 mmol/L (ref 101–111)
Creatinine, Ser: 1.2 mg/dL (ref 0.61–1.24)
GFR calc Af Amer: >60 mL/min (ref >60)
GFR, EST NON AFRICAN AMERICAN: 58 mL/min — AB (ref >60)
GLUCOSE: 110 mg/dL — AB (ref 65–99)
POTASSIUM: 3.5 mmol/L (ref 3.5–5.1)
Sodium: 138 mmol/L (ref 135–145)
TOTAL PROTEIN: 7.9 g/dL (ref 6.5–8.1)

## 2016-01-11 NOTE — Patient Instructions (Signed)
  Your procedure is scheduled on: 01/14/16 Report to Day Surgery. [facility] To find out your arrival time please call [phone] between 1PM - 3PM on 01/13/16  Remember: Instructions that are not followed completely may result in serious medical risk, up to and including death, or upon the discretion of your surgeon and anesthesiologist your surgery may need to be rescheduled.    __X__ 1. Do not eat food or drink liquids after midnight. No gum chewing or hard candies.     __X__ 2. No Alcohol for 24 hours before or after surgery.   ____ 3. Bring all medications with you on the day of surgery if instructed.    __X__ 4. Notify your doctor if there is any change in your medical condition     (cold, fever, infections).     Do not wear jewelry, make-up, hairpins, clips or nail polish.  Do not wear lotions, powders, or perfumes. You may wear deodorant.  Do not shave 48 hours prior to surgery. Men may shave face and neck.  Do not bring valuables to the hospital.    [facility] is not responsible for any belongings or valuables.               Contacts, dentures or bridgework may not be worn into surgery.  Leave your suitcase in the car. After surgery it may be brought to your room.  For patients admitted to the hospital, discharge time is determined by your                treatment team.   Patients discharged the day of surgery will not be allowed to drive home.   Please read over the following fact sheets that you were given:   Surgical Site Infection Prevention   _X___ Take these medicines the morning of surgery with A SIP OF WATER:    1. CARVEDILOL  2. ENALAPRIL  3. GABAPENTIN  4. FINASTERIDE  5.  6.  ____ Fleet Enema (as directed)   _X___ Use CHG Soap as directed  ____ Use inhalers on the day of surgery  __X__ Stop metformin 2 days prior to surgery    __X__ Take 1/2 of usual insulin dose the night before surgery and none on the morning of surgery.   ____  Stop Coumadin/Plavix/aspirin on  ____ Stop Anti-inflammatories on  ____ Stop supplements until after surgery.    ____ Bring C-Pap to the hospital.

## 2016-01-14 ENCOUNTER — Ambulatory Visit: Payer: Medicare Other | Admitting: Anesthesiology

## 2016-01-14 ENCOUNTER — Encounter
Admission: RE | Disposition: A | Discharge status: Home / Self Care | Payer: Self-pay | Source: Ambulatory Visit | Attending: Surgery

## 2016-01-14 ENCOUNTER — Ambulatory Visit
Admission: RE | Admit: 2016-01-14 | Discharge: 2016-01-14 | Disposition: A | Discharge status: Home / Self Care | Payer: Medicare Other | Source: Ambulatory Visit | Attending: Surgery | Admitting: Surgery

## 2016-01-14 DIAGNOSIS — Z79891 Long term (current) use of opiate analgesic: Secondary | ICD-10-CM | POA: Diagnosis not present

## 2016-01-14 DIAGNOSIS — Z801 Family history of malignant neoplasm of trachea, bronchus and lung: Secondary | ICD-10-CM | POA: Insufficient documentation

## 2016-01-14 DIAGNOSIS — F329 Major depressive disorder, single episode, unspecified: Secondary | ICD-10-CM | POA: Diagnosis not present

## 2016-01-14 DIAGNOSIS — Z794 Long term (current) use of insulin: Secondary | ICD-10-CM | POA: Insufficient documentation

## 2016-01-14 DIAGNOSIS — K432 Incisional hernia without obstruction or gangrene: Secondary | ICD-10-CM | POA: Insufficient documentation

## 2016-01-14 DIAGNOSIS — E119 Type 2 diabetes mellitus without complications: Secondary | ICD-10-CM | POA: Insufficient documentation

## 2016-01-14 DIAGNOSIS — Z87442 Personal history of urinary calculi: Secondary | ICD-10-CM | POA: Insufficient documentation

## 2016-01-14 DIAGNOSIS — Z823 Family history of stroke: Secondary | ICD-10-CM | POA: Insufficient documentation

## 2016-01-14 DIAGNOSIS — Z9889 Other specified postprocedural states: Secondary | ICD-10-CM | POA: Diagnosis not present

## 2016-01-14 DIAGNOSIS — Z79899 Other long term (current) drug therapy: Secondary | ICD-10-CM | POA: Diagnosis not present

## 2016-01-14 DIAGNOSIS — Z800 Family history of malignant neoplasm of digestive organs: Secondary | ICD-10-CM | POA: Diagnosis not present

## 2016-01-14 DIAGNOSIS — Z8551 Personal history of malignant neoplasm of bladder: Secondary | ICD-10-CM | POA: Diagnosis not present

## 2016-01-14 DIAGNOSIS — F419 Anxiety disorder, unspecified: Secondary | ICD-10-CM | POA: Insufficient documentation

## 2016-01-14 DIAGNOSIS — Z87891 Personal history of nicotine dependence: Secondary | ICD-10-CM | POA: Insufficient documentation

## 2016-01-14 DIAGNOSIS — K439 Ventral hernia without obstruction or gangrene: Secondary | ICD-10-CM | POA: Diagnosis not present

## 2016-01-14 DIAGNOSIS — Z885 Allergy status to narcotic agent status: Secondary | ICD-10-CM | POA: Diagnosis not present

## 2016-01-14 DIAGNOSIS — I10 Essential (primary) hypertension: Secondary | ICD-10-CM | POA: Diagnosis not present

## 2016-01-14 DIAGNOSIS — Z882 Allergy status to sulfonamides status: Secondary | ICD-10-CM | POA: Diagnosis not present

## 2016-01-14 HISTORY — PX: VENTRAL HERNIA REPAIR: SHX424

## 2016-01-14 LAB — GLUCOSE, CAPILLARY
GLUCOSE-CAPILLARY: 159 mg/dL — AB (ref 65–99)
Glucose-Capillary: 144 mg/dL — ABNORMAL HIGH (ref 65–99)

## 2016-01-14 SURGERY — REPAIR, HERNIA, VENTRAL
Anesthesia: General | Site: Abdomen | Wound class: Clean

## 2016-01-14 MED ORDER — FENTANYL CITRATE (PF) 100 MCG/2ML IJ SOLN
INTRAMUSCULAR | Status: AC
  Filled 2016-01-14: qty 2

## 2016-01-14 MED ORDER — CEFAZOLIN SODIUM-DEXTROSE 2-4 GM/100ML-% IV SOLN
2.0000 g | INTRAVENOUS | Status: AC
  Administered 2016-01-14: 2 g via INTRAVENOUS

## 2016-01-14 MED ORDER — ONDANSETRON HCL 4 MG/2ML IJ SOLN: 4.0000 mg | Freq: Once | INTRAMUSCULAR | Status: DC | PRN

## 2016-01-14 MED ORDER — PROPOFOL 10 MG/ML IV BOLUS
INTRAVENOUS | Status: DC | PRN
  Administered 2016-01-14: 170 mg via INTRAVENOUS

## 2016-01-14 MED ORDER — HYDROCODONE-ACETAMINOPHEN 5-325 MG PO TABS
1.0000 | ORAL_TABLET | ORAL | Status: DC | PRN
  Administered 2016-01-14: 1 via ORAL

## 2016-01-14 MED ORDER — FAMOTIDINE 20 MG PO TABS
20.0000 mg | ORAL_TABLET | Freq: Once | ORAL | Status: AC
  Administered 2016-01-14: 20 mg via ORAL

## 2016-01-14 MED ORDER — SODIUM CHLORIDE 0.9 % IV SOLN
INTRAVENOUS | Status: DC
  Administered 2016-01-14: 07:00:00 via INTRAVENOUS

## 2016-01-14 MED ORDER — HYDROCODONE-ACETAMINOPHEN 5-325 MG PO TABS
ORAL_TABLET | ORAL | Status: AC
  Filled 2016-01-14: qty 1

## 2016-01-14 MED ORDER — ROCURONIUM BROMIDE 100 MG/10ML IV SOLN
INTRAVENOUS | Status: DC | PRN
  Administered 2016-01-14: 40 mg via INTRAVENOUS
  Administered 2016-01-14: 10 mg via INTRAVENOUS

## 2016-01-14 MED ORDER — LIDOCAINE HCL (CARDIAC) 20 MG/ML IV SOLN
INTRAVENOUS | Status: DC | PRN
  Administered 2016-01-14: 80 mg via INTRAVENOUS

## 2016-01-14 MED ORDER — HYDROCODONE-ACETAMINOPHEN 5-325 MG PO TABS: 1.0000 | ORAL_TABLET | ORAL | Status: DC | PRN

## 2016-01-14 MED ORDER — CEFAZOLIN SODIUM-DEXTROSE 2-4 GM/100ML-% IV SOLN
INTRAVENOUS | Status: AC
  Filled 2016-01-14: qty 100

## 2016-01-14 MED ORDER — ONDANSETRON HCL 4 MG/2ML IJ SOLN
INTRAMUSCULAR | Status: DC | PRN
  Administered 2016-01-14: 4 mg via INTRAVENOUS

## 2016-01-14 MED ORDER — PHENYLEPHRINE HCL 10 MG/ML IJ SOLN
INTRAMUSCULAR | Status: DC | PRN
  Administered 2016-01-14: 200 ug via INTRAVENOUS

## 2016-01-14 MED ORDER — FENTANYL CITRATE (PF) 100 MCG/2ML IJ SOLN
25.0000 ug | INTRAMUSCULAR | Status: DC | PRN
  Administered 2016-01-14 (×2): 25 ug via INTRAVENOUS

## 2016-01-14 MED ORDER — MIDAZOLAM HCL 2 MG/2ML IJ SOLN
INTRAMUSCULAR | Status: DC | PRN
  Administered 2016-01-14: 2 mg via INTRAVENOUS

## 2016-01-14 MED ORDER — SUGAMMADEX SODIUM 200 MG/2ML IV SOLN
INTRAVENOUS | Status: DC | PRN
  Administered 2016-01-14: 160 mg via INTRAVENOUS

## 2016-01-14 MED ORDER — BUPIVACAINE-EPINEPHRINE 0.5% -1:200000 IJ SOLN
INTRAMUSCULAR | Status: DC | PRN
  Administered 2016-01-14: 20 mL

## 2016-01-14 MED ORDER — BUPIVACAINE-EPINEPHRINE (PF) 0.5% -1:200000 IJ SOLN
INTRAMUSCULAR | Status: AC
  Filled 2016-01-14: qty 30

## 2016-01-14 MED ORDER — FENTANYL CITRATE (PF) 100 MCG/2ML IJ SOLN
INTRAMUSCULAR | Status: DC | PRN
  Administered 2016-01-14 (×3): 50 ug via INTRAVENOUS

## 2016-01-14 MED ORDER — FAMOTIDINE 20 MG PO TABS
ORAL_TABLET | ORAL | Status: AC
  Administered 2016-01-14: 20 mg via ORAL
  Filled 2016-01-14: qty 1

## 2016-01-14 SURGICAL SUPPLY — 25 items
CANISTER SUCT 1200ML W/VALVE (MISCELLANEOUS) ×3 IMPLANT
CHLORAPREP W/TINT 26ML (MISCELLANEOUS) ×3 IMPLANT
DRAPE LAPAROTOMY 100X77 ABD (DRAPES) ×3 IMPLANT
ELECT REM PT RETURN 9FT ADLT (ELECTROSURGICAL) ×3
ELECTRODE REM PT RTRN 9FT ADLT (ELECTROSURGICAL) ×1 IMPLANT
GAUZE SPONGE 4X4 12PLY STRL (GAUZE/BANDAGES/DRESSINGS) IMPLANT
GLOVE BIO SURGEON STRL SZ7.5 (GLOVE) ×5 IMPLANT
GOWN STRL REUS W/ TWL LRG LVL3 (GOWN DISPOSABLE) ×3 IMPLANT
GOWN STRL REUS W/TWL LRG LVL3 (GOWN DISPOSABLE) ×12
KIT RM TURNOVER STRD PROC AR (KITS) ×3 IMPLANT
LABEL OR SOLS (LABEL) ×3 IMPLANT
LIQUID BAND (GAUZE/BANDAGES/DRESSINGS) ×3 IMPLANT
MESH SYNTHETIC 4X6 SOFT BARD (Mesh General) IMPLANT
MESH SYNTHETIC SOFT BARD 4X6 (Mesh General) ×2 IMPLANT
NDL HYPO 25X1 1.5 SAFETY (NEEDLE) ×1 IMPLANT
NEEDLE HYPO 25X1 1.5 SAFETY (NEEDLE) ×3 IMPLANT
NS IRRIG 500ML POUR BTL (IV SOLUTION) ×3 IMPLANT
PACK BASIN MINOR ARMC (MISCELLANEOUS) ×3 IMPLANT
STAPLER SKIN PROX 35W (STAPLE) IMPLANT
SUT CHROMIC 3 0 SH 27 (SUTURE) ×3 IMPLANT
SUT MNCRL 4-0 (SUTURE) ×3
SUT MNCRL 4-0 27XMFL (SUTURE) ×1
SUT SURGILON 0 30 BLK (SUTURE) ×9 IMPLANT
SUTURE MNCRL 4-0 27XMF (SUTURE) ×1 IMPLANT
SYRINGE 10CC LL (SYRINGE) ×3 IMPLANT

## 2016-01-14 NOTE — Transfer of Care (Signed)
Immediate Anesthesia Transfer of Care Note  Patient: Rick [name]  Procedure(s) Performed: Procedure(s): HERNIA REPAIR VENTRAL ADULT (N/A)  Patient Location: PACU  Anesthesia Type:General  Level of Consciousness: awake, alert , oriented and patient cooperative  Airway & Oxygen Therapy: Patient Spontanous Breathing and Patient connected to face mask oxygen  Post-op Assessment: Report given to RN, Post -op Vital signs reviewed and stable and Patient moving all extremities X 4  Post vital signs: Reviewed and stable  Last Vitals:  Filed Vitals:   01/14/16 0635 01/14/16 0906  BP: 150/82 143/15  Pulse: 82 96  Temp: 36.8 C 37.2 C  Resp: 16 20    Last Pain: There were no vitals filed for this visit.       Complications: No apparent anesthesia complications

## 2016-01-14 NOTE — Anesthesia Postprocedure Evaluation (Signed)
Anesthesia Post Note  Patient: Rick [name]  Procedure(s) Performed: Procedure(s) (LRB): HERNIA REPAIR VENTRAL ADULT (N/A)  Patient location during evaluation: PACU Anesthesia Type: General Level of consciousness: awake Pain management: pain level controlled Vital Signs Assessment: post-procedure vital signs reviewed and stable Respiratory status: spontaneous breathing Cardiovascular status: blood pressure returned to baseline Anesthetic complications: no    Last Vitals:  Filed Vitals:   01/14/16 0635 01/14/16 0906  BP: 150/82 143/15  Pulse: 82 96  Temp: 36.8 C 37.2 C  Resp: 16 20    Last Pain: There were no vitals filed for this visit.               VAN STAVEREN,Aarya Robinson

## 2016-01-14 NOTE — H&P (Signed)
  He reports no change in condition since office exam.  The hernia is currently reduced with palpable defect in left lower abdominal wall.  Labs noted, blood sugar 159  Discussed plan for ventral hernia repair.

## 2016-01-14 NOTE — Discharge Instructions (Addendum)
Take Tylenol or Norco if needed for pain.  May shower.  Avoid straining and heavy lifting.   DISCHARGE INSTRUCTIONS   1) The drugs that you were given will stay in your system until tomorrow so for the next 24 hours you should not:  A) Drive an automobile B) Make any legal decisions C) Drink any alcoholic beverage   2) You may resume regular meals tomorrow.  Today it is better to start with liquids and gradually work up to solid foods.  You may eat anything you prefer, but it is better to start with liquids, then soup and crackers, and gradually work up to solid foods.   3) Please notify your doctor immediately if you have any unusual bleeding, trouble breathing, redness and pain at the surgery site, drainage, fever, or pain not relieved by medication.  Please contact your physician with any problems or Same Day Surgery at [phone], Monday through Friday 6 am to 4 pm, or [place] [facility] number at [phone].  Open Hernia Repair, Care After Refer to this sheet in the next few weeks. These instructions provide you with information on caring for yourself after your procedure. Your health care provider may also give you more specific instructions. Your treatment has been planned according to current medical practices, but problems sometimes occur. Call your health care provider if you have any problems or questions after your procedure. WHAT TO EXPECT AFTER THE PROCEDURE After your procedure, it is typical to have the following: Pain in your abdomen, especially along your incision. You will be given pain medicines to control the pain. Constipation. You may be given a stool softener to help prevent this. HOME CARE INSTRUCTIONS Only take over-the-counter or prescription medicines as directed by your health care provider. Keep the incision area dry and clean. You may wash the incision area gently with soap and water 48 hours after surgery. Gently blot or dab the incision  area dry. Do not take baths, use swimming pools, or use hot tubs for 10 days or until your health care provider approves. Change bandages (dressings) as directed by your health care provider. Continue your normal diet as directed by your health care provider. Eat plenty of fruits and vegetables to help prevent constipation. Drink enough fluids to keep your urine clear or pale yellow. This also helps prevent constipation. Do not drive until your health care provider says it is okay. Do not lift anything heavier than 10 lb (4.5 kg) or play contact sports for 4 weeks or until your health care provider approves. Follow up with your health care provider as directed. Ask your health care provider when to make an appointment to have your stitches (sutures) or staples removed. SEEK MEDICAL CARE IF: You have increased bleeding coming from the incision site. You have blood in your stool. You have increasing pain in the incision area. You see redness or swelling in the incision area. You have fluid (pus) coming from the incision. You have a fever. You notice a bad smell coming from the incision area or dressing. SEEK IMMEDIATE MEDICAL CARE IF: You develop a rash. You have chest pain or shortness of breath. You feel lightheaded or feel faint.   This information is not intended to replace advice given to you by your health care provider. Make sure you discuss any questions you have with your health care provider.   Document Released: 02/17/2005 Document Revised: 08/21/2014 Document Reviewed: 03/12/2013 Elsevier Interactive Patient Education [employer].

## 2016-01-14 NOTE — Anesthesia Preprocedure Evaluation (Signed)
Anesthesia Evaluation  Patient identified by MRN, date of birth, ID band Patient awake    Reviewed: Allergy & Precautions, NPO status , Patient's Chart, lab work & pertinent test results  Airway Mallampati: II       Dental  (+) Edentulous Upper, Edentulous Lower   Pulmonary former smoker,    breath sounds clear to auscultation       Cardiovascular Exercise Tolerance: Good hypertension, Pt. on home beta blockers + dysrhythmias Atrial Fibrillation  Rhythm:Irregular     Neuro/Psych    GI/Hepatic negative GI ROS, Neg liver ROS,   Endo/Other  diabetes, Type 2, Oral Hypoglycemic Agents  Renal/GU      Musculoskeletal   Abdominal (+) + obese,   Peds  Hematology negative hematology ROS (+)   Anesthesia Other Findings   Reproductive/Obstetrics                             Anesthesia Physical Anesthesia Plan  ASA: III  Anesthesia Plan: General   Post-op Pain Management:    Induction: Intravenous  Airway Management Planned: Oral ETT  Additional Equipment:   Intra-op Plan:   Post-operative Plan: Extubation in OR  Informed Consent: I have reviewed the patients History and Physical, chart, labs and discussed the procedure including the risks, benefits and alternatives for the proposed anesthesia with the patient or authorized representative who has indicated his/her understanding and acceptance.     Plan Discussed with: CRNA  Anesthesia Plan Comments:         Anesthesia Quick Evaluation

## 2016-01-14 NOTE — Op Note (Signed)
OPERATIVE REPORT  PREOPERATIVE  DIAGNOSIS: Recurrent ventral hernia .  POSTOPERATIVE DIAGNOSIS: . Recurrent ventral hernia  PROCEDURE: . Repair of recurrent ventral hernia  ANESTHESIA:  General  SURGEON: Rochel Brome  MD   INDICATIONS: . He has a history of multiple ventral hernias and previous repairs. He recently developed bulging in the left mid abdomen. A ventral hernia was demonstrated on physical exam this is just about 4 cm caudad to an old scar and also has an old scar in the midline. Surgery was recommended for definitive treatment  The patient was placed on the operating table in the supine position under general endotracheal anesthesia. The abdomen was prepared with ChloraPrep and draped in a sterile manner. A transversely oriented incision was made in the left mid abdomen approximate 4 cm caudad to an old transverse scar. This incision was carried down through subcutaneous tissues. Several small bleeding points were cauterized. There was a ventral hernia sac which was encountered which was dissected free from surrounding tissues with blunt and sharp dissection. There was a fascial ring defect which was approximately 3 cm in dimension. Sac was dissected away from the fascia. There was a finding of mesh from previous repair along the medial cranial and lateral borders. There was no mesh seen on the caudal border. The fascia and mesh were dissected circumferentially. The hernia was reduced. Bard soft mesh was selected and cut to create an oval shape of some 3 x 4 cm and was placed into the properitoneal plane and sutured to the overlying mesh and fascia with through and through 0 Surgilon sutures. In the course of the repair old mesh was sutured from medial to lateral. The deep fascia and subcutaneous tissues were infiltrated with half percent Sensorcaine with epinephrine. The skin was closed with running 4-0 Monocryl subcuticular suture and LiquiBand.  The patient appeared to tolerate the  procedure satisfactorily and was prepared for transfer to the recovery room  [facility].D.

## 2016-01-14 NOTE — Anesthesia Procedure Notes (Signed)
Procedure Name: Intubation Date/Time: 01/14/2016 7:42 AM Performed by: Silvana Newness Pre-anesthesia Checklist: Patient identified, Emergency Drugs available, Suction available, Patient being monitored and Timeout performed Patient Re-evaluated:Patient Re-evaluated prior to inductionOxygen Delivery Method: Circle system utilized Preoxygenation: Pre-oxygenation with 100% oxygen Intubation Type: IV induction Ventilation: Mask ventilation without difficulty Laryngoscope Size: Mac and 4 Grade View: Grade I Tube type: Oral Number of attempts: 1 Airway Equipment and Method: Rigid stylet Placement Confirmation: ETT inserted through vocal cords under direct vision,  positive ETCO2 and breath sounds checked- equal and bilateral Secured at: 21 cm Tube secured with: Tape Dental Injury: Teeth and Oropharynx as per pre-operative assessment

## 2016-02-03 ENCOUNTER — Telehealth: Payer: Self-pay

## 2016-02-03 DIAGNOSIS — N400 Enlarged prostate without lower urinary tract symptoms: Secondary | ICD-10-CM

## 2016-02-03 MED ORDER — FINASTERIDE 5 MG PO TABS: 5.0000 mg | ORAL_TABLET | Freq: Every day | ORAL | Status: DC

## 2016-02-03 NOTE — Telephone Encounter (Signed)
Rx sent to his pharmacy

## 2016-02-03 NOTE — Telephone Encounter (Signed)
Pharmacy is requesting a refill on the following: Finasteride 5mg  1 tablet daily 90 with 1 refill

## 2016-02-11 ENCOUNTER — Encounter: Payer: Self-pay | Admitting: Surgery

## 2016-02-11 ENCOUNTER — Telehealth: Payer: Self-pay | Admitting: Unknown Physician Specialty

## 2016-02-11 MED ORDER — ONETOUCH ULTRA SYSTEM W/DEVICE KIT: 1.0000 | PACK | Freq: Once | Status: AC

## 2016-02-11 MED ORDER — ONETOUCH ULTRASOFT LANCETS MISC: 1.0000 | Freq: Two times a day (BID) | Status: DC

## 2016-02-11 NOTE — Telephone Encounter (Signed)
Pt called asking for a new meter and lancets.  He looked all over the house and doesn't know what happened to it.  Verbal order given for a new meter and lancets.

## 2016-02-14 ENCOUNTER — Other Ambulatory Visit: Payer: Self-pay | Admitting: Family Medicine

## 2016-02-17 ENCOUNTER — Other Ambulatory Visit: Payer: Self-pay

## 2016-02-17 DIAGNOSIS — E785 Hyperlipidemia, unspecified: Secondary | ICD-10-CM

## 2016-02-17 MED ORDER — LOVASTATIN 40 MG PO TABS: 40.0000 mg | ORAL_TABLET | Freq: Every day | ORAL | Status: DC

## 2016-02-17 MED ORDER — MIRTAZAPINE 15 MG PO TABS: 15.0000 mg | ORAL_TABLET | Freq: Every day | ORAL | Status: DC

## 2016-03-15 ENCOUNTER — Telehealth: Payer: Self-pay

## 2016-03-15 MED ORDER — METFORMIN HCL 1000 MG PO TABS: 1000.0000 mg | ORAL_TABLET | Freq: Two times a day (BID) | ORAL | 6 refills | Status: DC

## 2016-03-15 NOTE — Telephone Encounter (Signed)
Rx sent to his pharmacy

## 2016-03-15 NOTE — Telephone Encounter (Signed)
Metformin 1000 mg 1 tablet twice a day with a meal

## 2016-04-04 ENCOUNTER — Other Ambulatory Visit: Payer: Self-pay | Admitting: Family Medicine

## 2016-04-04 DIAGNOSIS — G64 Other disorders of peripheral nervous system: Secondary | ICD-10-CM

## 2016-04-07 ENCOUNTER — Other Ambulatory Visit: Payer: Self-pay | Admitting: Family Medicine

## 2016-04-07 DIAGNOSIS — G64 Other disorders of peripheral nervous system: Secondary | ICD-10-CM

## 2016-04-19 DIAGNOSIS — E119 Type 2 diabetes mellitus without complications: Secondary | ICD-10-CM | POA: Diagnosis not present

## 2016-04-19 LAB — HM DIABETES EYE EXAM

## 2016-04-25 ENCOUNTER — Other Ambulatory Visit: Payer: Self-pay | Admitting: Family Medicine

## 2016-05-09 ENCOUNTER — Other Ambulatory Visit: Payer: Self-pay | Admitting: Family Medicine

## 2016-05-09 DIAGNOSIS — I10 Essential (primary) hypertension: Secondary | ICD-10-CM

## 2016-05-24 ENCOUNTER — Other Ambulatory Visit: Payer: Self-pay | Admitting: Family Medicine

## 2016-05-29 ENCOUNTER — Other Ambulatory Visit: Payer: Self-pay | Admitting: Family Medicine

## 2016-06-06 ENCOUNTER — Encounter: Payer: Self-pay | Admitting: Family Medicine

## 2016-06-06 ENCOUNTER — Ambulatory Visit (INDEPENDENT_AMBULATORY_CARE_PROVIDER_SITE_OTHER): Payer: Medicare Other | Admitting: Family Medicine

## 2016-06-06 ENCOUNTER — Telehealth: Payer: Self-pay | Admitting: Family Medicine

## 2016-06-06 ENCOUNTER — Other Ambulatory Visit: Payer: Self-pay | Admitting: Family Medicine

## 2016-06-06 VITALS — BP 135/83 | HR 76 | Temp 98.7°F | Wt 179.7 lb

## 2016-06-06 DIAGNOSIS — E1142 Type 2 diabetes mellitus with diabetic polyneuropathy: Secondary | ICD-10-CM

## 2016-06-06 DIAGNOSIS — E782 Mixed hyperlipidemia: Secondary | ICD-10-CM

## 2016-06-06 DIAGNOSIS — G64 Other disorders of peripheral nervous system: Secondary | ICD-10-CM | POA: Diagnosis not present

## 2016-06-06 DIAGNOSIS — I10 Essential (primary) hypertension: Secondary | ICD-10-CM | POA: Diagnosis not present

## 2016-06-06 DIAGNOSIS — E785 Hyperlipidemia, unspecified: Secondary | ICD-10-CM

## 2016-06-06 LAB — UA/M W/RFLX CULTURE, ROUTINE
Bilirubin, UA: NEGATIVE
GLUCOSE, UA: NEGATIVE
KETONES UA: NEGATIVE
LEUKOCYTES UA: NEGATIVE
Nitrite, UA: NEGATIVE
Protein, UA: NEGATIVE
RBC UA: NEGATIVE
UUROB: 0.2 mg/dL (ref 0.2–1.0)
pH, UA: 5.5 (ref 5.0–7.5)

## 2016-06-06 LAB — LIPID PANEL PICCOLO, WAIVED
CHOLESTEROL PICCOLO, WAIVED: 146 mg/dL (ref <200)
Chol/HDL Ratio Piccolo,Waive: 3 mg/dL
HDL Chol Piccolo, Waived: 48 mg/dL — ABNORMAL LOW (ref >59)
LDL CHOL CALC PICCOLO WAIVED: 62 mg/dL (ref <100)
Triglycerides Piccolo,Waived: 177 mg/dL — ABNORMAL HIGH (ref <150)
VLDL Chol Calc Piccolo,Waive: 35 mg/dL — ABNORMAL HIGH (ref <30)

## 2016-06-06 LAB — MICROALBUMIN, URINE WAIVED
CREATININE, URINE WAIVED: 50 mg/dL (ref 10–300)
Microalb, Ur Waived: 10 mg/L (ref 0–19)

## 2016-06-06 LAB — BAYER DCA HB A1C WAIVED: HB A1C (BAYER DCA - WAIVED): 6.7 % (ref <7.0)

## 2016-06-06 MED ORDER — ENALAPRIL MALEATE 10 MG PO TABS: 10.0000 mg | ORAL_TABLET | Freq: Every day | ORAL | 1 refills | Status: DC

## 2016-06-06 MED ORDER — HYDROCHLOROTHIAZIDE 25 MG PO TABS: ORAL_TABLET | ORAL | 1 refills | Status: DC

## 2016-06-06 MED ORDER — INSULIN GLARGINE 100 UNIT/ML SOLOSTAR PEN: 15.0000 [IU] | PEN_INJECTOR | Freq: Every day | SUBCUTANEOUS | 1 refills | Status: DC

## 2016-06-06 MED ORDER — LOVASTATIN 40 MG PO TABS: 40.0000 mg | ORAL_TABLET | Freq: Every day | ORAL | 1 refills | Status: DC

## 2016-06-06 MED ORDER — CARVEDILOL 6.25 MG PO TABS: 6.2500 mg | ORAL_TABLET | Freq: Two times a day (BID) | ORAL | 1 refills | Status: DC

## 2016-06-06 MED ORDER — DULAGLUTIDE 0.75 MG/0.5ML ~~LOC~~ SOAJ: 0.7500 mg | SUBCUTANEOUS | 3 refills | Status: DC

## 2016-06-06 MED ORDER — METFORMIN HCL 1000 MG PO TABS: 1000.0000 mg | ORAL_TABLET | Freq: Two times a day (BID) | ORAL | 1 refills | Status: DC

## 2016-06-06 MED ORDER — GABAPENTIN 300 MG PO CAPS: 300.0000 mg | ORAL_CAPSULE | Freq: Two times a day (BID) | ORAL | 1 refills | Status: DC

## 2016-06-06 NOTE — Telephone Encounter (Signed)
Pt called stated he went to the pharmacy and Trulicity and the copay is too expensive. Pt prefers to go back to Glipzide. Please call pt ASAP. Pt is being extremely rude and demeaning. Please follow up with patient. Thanks.

## 2016-06-06 NOTE — Assessment & Plan Note (Signed)
Under good control. Continue current regimen. Continue current regimen. Call with any concerns.

## 2016-06-06 NOTE — Assessment & Plan Note (Addendum)
A1c came back at 6.7. Will start him on trulicity. Call if AM blood sugars below 110 and we will reduce lantus. Call with any concerns.

## 2016-06-06 NOTE — Progress Notes (Signed)
BP 135/83 (BP Location: Left Arm, Patient Position: Sitting, Cuff Size: Normal)   Pulse 76   Temp 98.7 F (37.1 C)   Wt 179 lb 11.2 oz (81.5 kg)   SpO2 96%   BMI 27.73 kg/m    Subjective:    Patient ID: Rick [name], male    DOB: 11-10-1940, 74 y.o.   MRN: [id number]  HPI: Rick [name] is a 75 y.o. male  Chief Complaint  Patient presents with  . Diabetes  . Hyperlipidemia  . Hypertension   DIABETES Hypoglycemic episodes:no Polydipsia/polyuria: no Visual disturbance: no Chest pain: no Paresthesias: no Glucose Monitoring: yes  Accucheck frequency: TID Taking Insulin?: yes  Long acting insulin: yes Blood Pressure Monitoring: not checking Retinal Examination: Up to Date Foot Exam: Up to Date Diabetic Education: Not Completed Pneumovax: Refused Influenza: Refused Aspirin: no  HYPERTENSION / HYPERLIPIDEMIA Satisfied with current treatment? yes Duration of hypertension: chronic BP monitoring frequency: not checking BP medication side effects: no Duration of hyperlipidemia: chronic Cholesterol medication side effects: no Cholesterol supplements: none Past cholesterol medications: lovastatin (mevacor) Medication compliance: excellent compliance Aspirin: no Recent stressors: no Recurrent headaches: no Visual changes: no Palpitations: no Dyspnea: no Chest pain: no Lower extremity edema: no Dizzy/lightheaded: no  Relevant past medical, surgical, family and social history reviewed and updated as indicated. Interim medical history since our last visit reviewed. Allergies and medications reviewed and updated.  Review of Systems  Constitutional: Negative.   Respiratory: Negative.   Cardiovascular: Negative.   Gastrointestinal: Negative.   Psychiatric/Behavioral: Negative.    Per HPI unless specifically indicated above     Objective:    BP 135/83 (BP Location: Left Arm, Patient Position: Sitting, Cuff Size: Normal)   Pulse 76   Temp 98.7 F (37.1  C)   Wt 179 lb 11.2 oz (81.5 kg)   SpO2 96%   BMI 27.73 kg/m   Wt Readings from Last 3 Encounters:  06/06/16 179 lb 11.2 oz (81.5 kg)  01/14/16 184 lb (83.5 kg)  01/11/16 184 lb (83.5 kg)    Physical Exam  Constitutional: He is oriented to person, place, and time. He appears well-developed and well-nourished. No distress.  HENT:  Head: Normocephalic and atraumatic.  Right Ear: Hearing normal.  Left Ear: Hearing normal.  Nose: Nose normal.  Eyes: Conjunctivae and lids are normal. Right eye exhibits no discharge. Left eye exhibits no discharge. No scleral icterus.  Cardiovascular: Normal rate, regular rhythm, normal heart sounds and intact distal pulses.  Exam reveals no gallop and no friction rub.   No murmur heard. Pulmonary/Chest: Effort normal and breath sounds normal. No respiratory distress. He has no wheezes. He has no rales. He exhibits no tenderness.  Musculoskeletal: Normal range of motion.  Neurological: He is alert and oriented to person, place, and time.  Skin: Skin is warm, dry and intact. No rash noted. No erythema. No pallor.  Psychiatric: He has a normal mood and affect. His speech is normal and behavior is normal. Judgment and thought content normal. Cognition and memory are normal.  Nursing note and vitals reviewed.   Results for orders placed or performed in visit on 03/03/16  Hemoglobin A1c  Result Value Ref Range   Hemoglobin A1C 6.3       Assessment & Plan:   Problem List Items Addressed This Visit      Cardiovascular and Mediastinum   Hypertension    Under good control. Continue current regimen. Continue current regimen. Call with any concerns.  Relevant Medications   lovastatin (MEVACOR) 40 MG tablet   hydrochlorothiazide (HYDRODIURIL) 25 MG tablet   enalapril (VASOTEC) 10 MG tablet   carvedilol (COREG) 6.25 MG tablet     Endocrine   Diabetic peripheral neuropathy associated with type 2 diabetes mellitus (HCC) - Primary    A1c came back  at 6.7. Will start him on trulicity. Call if AM blood sugars below 110 and we will reduce lantus. Call with any concerns.       Relevant Medications   Dulaglutide (TRULICITY) A999333 [id number] SOPN   lovastatin (MEVACOR) 40 MG tablet   metFORMIN (GLUCOPHAGE) 1000 MG tablet   Insulin Glargine (LANTUS SOLOSTAR) 100 UNIT/ML Solostar Pen   enalapril (VASOTEC) 10 MG tablet   gabapentin (NEURONTIN) 300 MG capsule     Other   Hyperlipidemia    Under good control. Call with any concerns. Continue current regimen.       Relevant Medications   lovastatin (MEVACOR) 40 MG tablet   hydrochlorothiazide (HYDRODIURIL) 25 MG tablet   enalapril (VASOTEC) 10 MG tablet   carvedilol (COREG) 6.25 MG tablet    Other Visit Diagnoses    Dyslipidemia       Relevant Medications   lovastatin (MEVACOR) 40 MG tablet   Disorder of peripheral nervous system (HCC)       Relevant Medications   gabapentin (NEURONTIN) 300 MG capsule       Follow up plan: Return in about 3 months (around 09/06/2016) for DM follow up.

## 2016-06-06 NOTE — Assessment & Plan Note (Signed)
Under good control. Call with any concerns. Continue current regimen.  

## 2016-06-06 NOTE — Patient Instructions (Addendum)
Give yourself your trulicity every Tuesday.  Call me if your morning blood sugar goes below 110

## 2016-06-07 ENCOUNTER — Encounter: Payer: Self-pay | Admitting: Family Medicine

## 2016-06-07 LAB — COMPREHENSIVE METABOLIC PANEL
A/G RATIO: 1.7 (ref 1.2–2.2)
ALT: 23 IU/L (ref 0–44)
AST: 24 IU/L (ref 0–40)
Albumin: 4.6 g/dL (ref 3.5–4.8)
Alkaline Phosphatase: 46 IU/L (ref 39–117)
BUN/Creatinine Ratio: 15 (ref 10–24)
BUN: 13 mg/dL (ref 8–27)
Bilirubin Total: 0.5 mg/dL (ref 0.0–1.2)
CALCIUM: 9.3 mg/dL (ref 8.6–10.2)
CO2: 21 mmol/L (ref 18–29)
CREATININE: 0.89 mg/dL (ref 0.76–1.27)
Chloride: 103 mmol/L (ref 96–106)
GFR, EST AFRICAN AMERICAN: 97 mL/min/{1.73_m2} (ref >59)
GFR, EST NON AFRICAN AMERICAN: 84 mL/min/{1.73_m2} (ref >59)
Globulin, Total: 2.7 g/dL (ref 1.5–4.5)
Glucose: 127 mg/dL — ABNORMAL HIGH (ref 65–99)
POTASSIUM: 3.8 mmol/L (ref 3.5–5.2)
Sodium: 143 mmol/L (ref 134–144)
TOTAL PROTEIN: 7.3 g/dL (ref 6.0–8.5)

## 2016-06-07 NOTE — Telephone Encounter (Signed)
$  60 for trulicity is actually a very good price. If he cannot afford that, then I'd like to keep him on our current plan and recheck in 3 months rather than going back on his glipizide. We can discuss that further if he calls back.

## 2016-06-08 NOTE — Telephone Encounter (Signed)
He is happy with how the trulicity is working. Sugars look good. He is concerned about price.   Asked him to come in to sign records release for his eye doctor.

## 2016-06-08 NOTE — Telephone Encounter (Signed)
Please have patient come in to sign a records release for [facility].

## 2016-07-26 ENCOUNTER — Telehealth: Payer: Self-pay | Admitting: Family Medicine

## 2016-07-26 NOTE — Telephone Encounter (Signed)
Patient called stating he was informed diabetes can cause ED and he is wanting to have the generic for viagra.  He is requesting Dr Durenda Age nurse call him back to discuss with him.  Thank Dennis Bast  Santiago Glad  [phone]

## 2016-07-27 NOTE — Telephone Encounter (Signed)
We can discuss this at his follow up appointment.

## 2016-07-27 NOTE — Telephone Encounter (Signed)
Patient notified

## 2016-08-15 ENCOUNTER — Other Ambulatory Visit: Payer: Self-pay | Admitting: Family Medicine

## 2016-08-29 ENCOUNTER — Encounter: Payer: Self-pay | Admitting: Family Medicine

## 2016-08-29 ENCOUNTER — Ambulatory Visit (INDEPENDENT_AMBULATORY_CARE_PROVIDER_SITE_OTHER): Payer: Medicare Other | Admitting: Family Medicine

## 2016-08-29 VITALS — BP 121/86 | HR 78 | Temp 98.6°F | Wt 173.4 lb

## 2016-08-29 DIAGNOSIS — D485 Neoplasm of uncertain behavior of skin: Secondary | ICD-10-CM | POA: Diagnosis not present

## 2016-08-29 DIAGNOSIS — L821 Other seborrheic keratosis: Secondary | ICD-10-CM | POA: Diagnosis not present

## 2016-08-29 DIAGNOSIS — N529 Male erectile dysfunction, unspecified: Secondary | ICD-10-CM

## 2016-08-29 MED ORDER — SILDENAFIL CITRATE 100 MG PO TABS: 50.0000 mg | ORAL_TABLET | Freq: Every day | ORAL | 11 refills | Status: DC | PRN

## 2016-08-29 NOTE — Assessment & Plan Note (Signed)
Will start him on viagra. Discussed that he must tell EMS that he is taking this medicine. Call with any concerns.

## 2016-08-29 NOTE — Progress Notes (Signed)
BP 121/86 (BP Location: Left Arm, Patient Position: Sitting, Cuff Size: Normal)   Pulse 78   Temp 98.6 F (37 C)   Wt 173 lb 6.4 oz (78.7 kg)   SpO2 98%   BMI 26.76 kg/m    Subjective:    Patient ID: Rick [name], male    DOB: 08/06/1941, 76 y.o.   MRN: [id number]  HPI: Rick [name] is a 76 y.o. male  Chief Complaint  Patient presents with  . Nevus   LUMP Duration: Couple of weeks Location: top of his head Onset: sudden Painful: yes  Discomfort: yes Status:  unknown Trauma: no Redness: no Bruising: no Recent infection: no Swollen lymph nodes: no Requesting removal: no History of cancer: yes History of the same: yes  Has been having some trouble    Relevant past medical, surgical, family and social history reviewed and updated as indicated. Interim medical history since our last visit reviewed. Allergies and medications reviewed and updated.  Review of Systems  Constitutional: Negative.   Respiratory: Negative.   Cardiovascular: Negative.   Genitourinary: Negative for decreased urine volume, difficulty urinating, discharge, dysuria, enuresis, flank pain, frequency, genital sores, hematuria, penile pain, penile swelling, scrotal swelling, testicular pain and urgency.  Psychiatric/Behavioral: Negative.     Per HPI unless specifically indicated above     Objective:    BP 121/86 (BP Location: Left Arm, Patient Position: Sitting, Cuff Size: Normal)   Pulse 78   Temp 98.6 F (37 C)   Wt 173 lb 6.4 oz (78.7 kg)   SpO2 98%   BMI 26.76 kg/m   Wt Readings from Last 3 Encounters:  08/29/16 173 lb 6.4 oz (78.7 kg)  06/06/16 179 lb 11.2 oz (81.5 kg)  01/14/16 184 lb (83.5 kg)    Physical Exam  Constitutional: He is oriented to person, place, and time. He appears well-developed and well-nourished. No distress.  HENT:  Head: Normocephalic and atraumatic.  Right Ear: Hearing normal.  Left Ear: Hearing normal.  Nose: Nose normal.  Eyes: Conjunctivae  and lids are normal. Right eye exhibits no discharge. Left eye exhibits no discharge. No scleral icterus.  Cardiovascular: Normal rate, regular rhythm, normal heart sounds and intact distal pulses.  Exam reveals no gallop and no friction rub.   No murmur heard. Pulmonary/Chest: Effort normal and breath sounds normal. No respiratory distress. He has no wheezes. He has no rales. He exhibits no tenderness.  Musculoskeletal: Normal range of motion.  Neurological: He is alert and oriented to person, place, and time.  Skin: Skin is warm, dry and intact. No rash noted. No erythema. No pallor.  62mm stuck on lesion on L side of his head behind his L ear  Psychiatric: He has a normal mood and affect. His speech is normal and behavior is normal. Judgment and thought content normal. Cognition and memory are normal.  Nursing note and vitals reviewed.   Results for orders placed or performed in visit on 06/06/16  Comprehensive metabolic panel  Result Value Ref Range   Glucose 127 (H) 65 - 99 mg/dL   BUN 13 8 - 27 mg/dL   Creatinine, Ser 0.89 0.76 - 1.27 mg/dL   GFR calc non Af Amer 84 >59 mL/min/1.73   GFR calc Af Amer 97 >59 mL/min/1.73   BUN/Creatinine Ratio 15 10 - 24   Sodium 143 134 - 144 mmol/L   Potassium 3.8 3.5 - 5.2 mmol/L   Chloride 103 96 - 106 mmol/L   CO2  21 18 - 29 mmol/L   Calcium 9.3 8.6 - 10.2 mg/dL   Total Protein 7.3 6.0 - 8.5 g/dL   Albumin 4.6 3.5 - 4.8 g/dL   Globulin, Total 2.7 1.5 - 4.5 g/dL   Albumin/Globulin Ratio 1.7 1.2 - 2.2   Bilirubin Total 0.5 0.0 - 1.2 mg/dL   Alkaline Phosphatase 46 39 - 117 IU/L   AST 24 0 - 40 IU/L   ALT 23 0 - 44 IU/L  Bayer DCA Hb A1c Waived  Result Value Ref Range   Bayer DCA Hb A1c Waived 6.7 <7.0 %  Lipid Panel Piccolo, Waived  Result Value Ref Range   Cholesterol Piccolo, Waived 146 <200 mg/dL   HDL Chol Piccolo, Waived 48 (L) >59 mg/dL   Triglycerides Piccolo,Waived 177 (H) <150 mg/dL   Chol/HDL Ratio Piccolo,Waive 3.0 mg/dL    LDL Chol Calc Piccolo Waived 62 <100 mg/dL   VLDL Chol Calc Piccolo,Waive 35 (H) <30 mg/dL  Microalbumin, Urine Waived  Result Value Ref Range   Microalb, Ur Waived 10 0 - 19 mg/L   Creatinine, Urine Waived 50 10 - 300 mg/dL   Microalb/Creat Ratio 30-300 (H) <30 mg/g  UA/M w/rflx Culture, Routine  Result Value Ref Range   Specific Gravity, UA <1.005 (L) 1.005 - 1.030   pH, UA 5.5 5.0 - 7.5   Color, UA Yellow Yellow   Appearance Ur Clear Clear   Leukocytes, UA Negative Negative   Protein, UA Negative Negative/Trace   Glucose, UA Negative Negative   Ketones, UA Negative Negative   RBC, UA Negative Negative   Bilirubin, UA Negative Negative   Urobilinogen, Ur 0.2 0.2 - 1.0 mg/dL   Nitrite, UA Negative Negative      Assessment & Plan:   Problem List Items Addressed This Visit      Genitourinary   ED (erectile dysfunction) of organic origin    Will start him on viagra. Discussed that he must tell EMS that he is taking this medicine. Call with any concerns.        Other Visit Diagnoses    Neoplasm of uncertain behavior of skin    -  Primary   Removed today as below. Await results.    Relevant Orders   Pathology Report      Skin Procedure  Procedure: Informed consent given.  Sterile prep of the area.  Area infiltrated with lidocaine with epinephrine.  Using a surgical blade, part of the upper dermis shaved off and sent  for pathology.  Area cauterized. Pt ed on scarring.     Diagnosis:   ICD-9-CM ICD-10-CM   1. Neoplasm of uncertain behavior of skin 238.2 D48.5 Pathology Report   Removed today as below. Await results.   2. ED (erectile dysfunction) of organic origin 607.84 N52.9     Lesion Location/Size: 35mm L side of his head behind his ear Physician: MJ Consent:  Risks, benefits, and alternative treatments discussed and all questions were answered.  Patient elected to proceed and verbal consent obtained.  Description: Area prepped and draped using semi-sterile  technique. Area locally anesthetized using 2 cc's of lidocaine 2% with epi. Shave biopsy of lesion performed using a dermablade.  Adequate hemostastis achieved using Silver Nitrate. Post Procedure Instructions: Wound care instructions discussed and patient was instructed to keep area clean and dry.  Signs and symptoms of infection discussed, patient agrees to contact the office ASAP should they occur.  Dressing change recommended every other day.  Follow up plan: Return As scheduled, for DM visit.

## 2016-09-01 LAB — PATHOLOGY

## 2016-09-04 ENCOUNTER — Telehealth: Payer: Self-pay | Admitting: Family Medicine

## 2016-09-04 NOTE — Telephone Encounter (Signed)
Please let him know that his mole was just a mole- nothing to worry about. Thanks!

## 2016-09-04 NOTE — Telephone Encounter (Signed)
Called patient, no answer, unable to leave a message, will try again later.  

## 2016-09-05 ENCOUNTER — Ambulatory Visit (INDEPENDENT_AMBULATORY_CARE_PROVIDER_SITE_OTHER): Payer: Medicare Other | Admitting: Family Medicine

## 2016-09-05 ENCOUNTER — Encounter: Payer: Self-pay | Admitting: Family Medicine

## 2016-09-05 VITALS — BP 123/81 | HR 92 | Temp 97.6°F | Wt 172.4 lb

## 2016-09-05 DIAGNOSIS — E1142 Type 2 diabetes mellitus with diabetic polyneuropathy: Secondary | ICD-10-CM

## 2016-09-05 LAB — BAYER DCA HB A1C WAIVED: HB A1C (BAYER DCA - WAIVED): 6.4 % (ref <7.0)

## 2016-09-05 MED ORDER — DULAGLUTIDE 0.75 MG/0.5ML ~~LOC~~ SOAJ: 0.7500 mg | SUBCUTANEOUS | 1 refills | Status: DC

## 2016-09-05 NOTE — Patient Instructions (Addendum)
Type 2 Diabetes Mellitus, Self Care, Adult °Caring for yourself after you have been diagnosed with type 2 diabetes (type 2 diabetes mellitus) means keeping your blood sugar (glucose) under control with a balance of: °· Nutrition. °· Exercise. °· Lifestyle changes. °· Medicines or insulin, if necessary. °· Support from your team of health care providers and others. ° °The following information explains what you need to know to manage your diabetes at home. °What do I need to do to manage my blood glucose? °· Check your blood glucose every day, as often as told by your health care provider. °· Contact your health care provider if your blood glucose is above your target for 2 tests in a row. °· Have your A1c (hemoglobin A1c) level checked at least two times a year, or as often as told by your health care provider. °Your health care provider will set individualized treatment goals for you. Generally, the goal of treatment is to maintain the following blood glucose levels: °· Before meals (preprandial): 80-130 mg/dL (4.4-7.2 mmol/L). °· After meals (postprandial): below 180 mg/dL (10 mmol/L). °· A1c level: less than 7%. ° °What do I need to know about hyperglycemia and hypoglycemia? °What is hyperglycemia? °Hyperglycemia, also called high blood glucose, occurs when blood glucose is too high. Make sure you know the early signs of hyperglycemia, such as: °· Increased thirst. °· Hunger. °· Feeling very tired. °· Needing to urinate more often than usual. °· Blurry vision. ° °What is hypoglycemia? °Hypoglycemia, also called low blood glucose, occurs with a blood glucose level at or below 70 mg/dL (3.9 mmol/L). The risk for hypoglycemia increases during or after exercise, during sleep, during illness, and when skipping meals or not eating for a long time (fasting). °It is important to know the symptoms of hypoglycemia and treat it right away. Always have a 15-gram rapid-acting carbohydrate snack with you to treat low blood  glucose. Family members and close friends should also know the symptoms and should understand how to treat hypoglycemia, in case you are not able to treat yourself. °What are the symptoms of hypoglycemia? °Hypoglycemia symptoms can include: °· Hunger. °· Anxiety. °· Sweating and feeling clammy. °· Confusion. °· Dizziness or feeling light-headed. °· Sleepiness. °· Nausea. °· Increased heart rate. °· Headache. °· Blurry vision. °· Seizure. °· Nightmares. °· Tingling or numbness around the mouth, lips, or tongue. °· A change in speech. °· Decreased ability to concentrate. °· A change in coordination. °· Restless sleep. °· Tremors or shakes. °· Fainting. °· Irritability. ° °How do I treat hypoglycemia? ° °If you are alert and able to swallow safely, follow the 15:15 rule: °· Take 15 grams of a rapid-acting carbohydrate. Rapid-acting options include: °? 1 tube of glucose gel. °? 3 glucose pills. °? 6-8 pieces of hard candy. °? 4 oz (120 mL) of fruit juice. °? 4 oz (120 mL) of regular (not diet) soda. °· Check your blood glucose 15 minutes after you take the carbohydrate. °· If the repeat blood glucose level is still at or below 70 mg/dL (3.9 mmol/L), take 15 grams of a carbohydrate again. °· If your blood glucose level does not increase above 70 mg/dL (3.9 mmol/L) after 3 tries, seek emergency medical care. °· After your blood glucose level returns to normal, eat a meal or a snack within 1 hour. ° °How do I treat severe hypoglycemia? °Severe hypoglycemia is when your blood glucose level is at or below 54 mg/dL (3 mmol/L). Severe hypoglycemia is an emergency. Do not   wait to see if the symptoms will go away. Get medical help right away. Call your local emergency services (911 in the [place] Do not drive yourself to the hospital. °If you have severe hypoglycemia and you cannot eat or drink, you may need an injection of glucagon. A family member or close friend should learn how to check your blood glucose and how to give you  a glucagon injection. Ask your health care provider if you need to have an emergency glucagon injection kit available. °Severe hypoglycemia may need to be treated in a hospital. The treatment may include getting glucose through an IV tube. You may also need treatment for the cause of your hypoglycemia. °Can having diabetes put me at risk for other conditions? °Having diabetes can put you at risk for other long-term (chronic) conditions, such as heart disease and kidney disease. Your health care provider may prescribe medicines to help prevent complications from diabetes. These medicines may include: °· Aspirin. °· Medicine to lower cholesterol. °· Medicine to control blood pressure. ° °What else can I do to manage my diabetes? °Take your diabetes medicines as told °· If your health care provider prescribed insulin or diabetes medicines, take them every day. °· Do not run out of insulin or other diabetes medicines that you take. Plan ahead so you always have these available. °· If you use insulin, adjust your dosage based on how physically active you are and what foods you eat. Your health care provider will tell you how to adjust your dosage. °Make healthy food choices ° °The things that you eat and drink affect your blood glucose and your insulin dosage. Making good choices helps to control your diabetes and prevent other health problems. A healthy meal plan includes eating lean proteins, complex carbohydrates, fresh fruits and vegetables, low-fat dairy products, and healthy fats. °Make an appointment to see a diet and nutrition specialist (registered dietitian) to help you create an eating plan that is right for you. Make sure that you: °· Follow instructions from your health care provider about eating or drinking restrictions. °· Drink enough fluid to keep your urine clear or pale yellow. °· Eat healthy snacks between nutritious meals. °· Track the carbohydrates that you eat. Do this by reading food labels and  learning the standard serving sizes of foods. °· Follow your sick day plan whenever you cannot eat or drink as usual. Make this plan in advance with your health care provider. ° °Stay active ° °Exercise regularly, as told by your health care provider. This may include: °· Stretching and doing strength exercises, such as yoga or weightlifting, at least 2 times a week. °· Doing at least 150 minutes of moderate-intensity or vigorous-intensity exercise each week. This could be brisk walking, biking, or water aerobics. °? Spread out your activity over at least 3 days of the week. °? Do not go more than 2 days in a row without doing some kind of physical activity. ° °When you start a new exercise or activity, work with your health care provider to adjust your insulin, medicines, or food intake as needed. °Make healthy lifestyle choices °· Do not use any tobacco products, such as cigarettes, chewing tobacco, and e-cigarettes. If you need help quitting, ask your health care provider. °· If your health care provider says that alcohol is safe for you, limit alcohol intake to no more than 1 drink per day for nonpregnant women and 2 drinks per day for men. One drink equals 12 oz of   beer, 5 oz of wine, or 1½ oz of hard liquor. °· Learn to manage stress. If you need help with this, ask your health care provider. °Care for your body ° °· Keep your immunizations up to date. In addition to getting vaccinations as told by your health care provider, it is recommended that you get vaccinated against the following illnesses: °? The flu (influenza). Get a flu shot every year. °? Pneumonia. °? Hepatitis B. °· Schedule an eye exam soon after your diagnosis, and then one time every year after that. °· Check your skin and feet every day for cuts, bruises, redness, blisters, or sores. Schedule a foot exam with your health care provider once every year. °· Brush your teeth and gums two times a day, and floss at least one time a day. Visit your  dentist at least once every 6 months. °· Maintain a healthy weight. °General instructions °· Take over-the-counter and prescription medicines only as told by your health care provider. °· Share your diabetes management plan with people in your workplace, school, and household. °· Check your urine for ketones when you are ill and as told by your health care provider. °· Ask your health care provider: °? Do I need to meet with a diabetes educator? °? Where can I find a support group for people with diabetes? °· Carry a medical alert card or wear medical alert jewelry. °· Keep all follow-up visits as told by your health care provider. This is important. °Where to find more information: °For more information about diabetes, visit: °· American Diabetes Association (ADA): www.diabetes.org °· American Association of Diabetes Educators (AADE): www.diabeteseducator.org/patient-resources ° °This information is not intended to replace advice given to you by your health care provider. Make sure you discuss any questions you have with your health care provider. °Document Released: 11/22/2015 Document Revised: 01/06/2016 Document Reviewed: 09/03/2015 °Elsevier Interactive Patient Education © 2017 [employer] ° °

## 2016-09-05 NOTE — Progress Notes (Signed)
BP 123/81 (BP Location: Left Arm, Patient Position: Sitting, Cuff Size: Normal)   Pulse 92   Temp 97.6 F (36.4 C)   Wt 172 lb 6.4 oz (78.2 kg)   SpO2 96%   BMI 26.60 kg/m    Subjective:    Patient ID: Rick [name], male    DOB: [birth year], 76 y.o.   MRN: [id number]  HPI: Rick [name] is a 76 y.o. male  Chief Complaint  Patient presents with  . Diabetes   DIABETES Hypoglycemic episodes:no Polydipsia/polyuria: no Visual disturbance: no Chest pain: no Paresthesias: no Glucose Monitoring: no Taking Insulin?: yes  Long acting insulin: 15 units lantus q10PM  Short acting insulin: no Blood Pressure Monitoring: not checking Retinal Examination: Not up to Date Foot Exam: Up to Date Diabetic Education: Completed Pneumovax: Not Up to Date- declined Influenza: Not Up to Date- declined Aspirin: yes  Relevant past medical, surgical, family and social history reviewed and updated as indicated. Interim medical history since our last visit reviewed. Allergies and medications reviewed and updated.  Review of Systems  Constitutional: Negative.   Respiratory: Negative.   Cardiovascular: Negative.   Psychiatric/Behavioral: Negative.     Per HPI unless specifically indicated above     Objective:    BP 123/81 (BP Location: Left Arm, Patient Position: Sitting, Cuff Size: Normal)   Pulse 92   Temp 97.6 F (36.4 C)   Wt 172 lb 6.4 oz (78.2 kg)   SpO2 96%   BMI 26.60 kg/m   Wt Readings from Last 3 Encounters:  09/05/16 172 lb 6.4 oz (78.2 kg)  08/29/16 173 lb 6.4 oz (78.7 kg)  06/06/16 179 lb 11.2 oz (81.5 kg)    Physical Exam  Constitutional: He is oriented to person, place, and time. He appears well-developed and well-nourished. No distress.  HENT:  Head: Normocephalic and atraumatic.  Right Ear: Hearing normal.  Left Ear: Hearing normal.  Nose: Nose normal.  Eyes: Conjunctivae and lids are normal. Right eye exhibits no discharge. Left eye exhibits no  discharge. No scleral icterus.  Cardiovascular: Normal rate, regular rhythm, normal heart sounds and intact distal pulses.  Exam reveals no gallop and no friction rub.   No murmur heard. Pulmonary/Chest: Effort normal and breath sounds normal. No respiratory distress. He has no wheezes. He has no rales. He exhibits no tenderness.  Musculoskeletal: Normal range of motion.  Neurological: He is alert and oriented to person, place, and time.  Skin: Skin is warm, dry and intact. No rash noted. He is not diaphoretic. No erythema. No pallor.  Psychiatric: His speech is normal. Thought content normal. His affect is inappropriate. He is agitated. He is not aggressive, not hyperactive, not slowed, not withdrawn, not actively hallucinating and not combative. Cognition and memory are normal. He expresses impulsivity. He is attentive.  Nursing note and vitals reviewed.   Results for orders placed or performed in visit on 08/29/16  Pathology Report  Result Value Ref Range   PATH REPORT.SITE OF ORIGIN SPEC Comment    . Comment    PATH REPORT.FINAL DX SPEC Comment    SIGNED OUT BY: Comment    GROSS DESCRIPTION: Comment    . Comment    PAYMENT PROCEDURE Comment       Assessment & Plan:   Problem List Items Addressed This Visit      Endocrine   Diabetic peripheral neuropathy associated with type 2 diabetes mellitus ([place]) - Primary    A1c came back at 6.4.  Will continue current regimen and continue to monitor. Call with any concerns.       Relevant Medications   Dulaglutide (TRULICITY) [id number] SOPN   Other Relevant Orders   Bayer DCA Hb A1c Waived       Follow up plan: Return in about 3 months (around 12/04/2016) for Wellness exam.

## 2016-09-05 NOTE — Assessment & Plan Note (Signed)
A1c came back at 6.4. Will continue current regimen and continue to monitor. Call with any concerns.

## 2016-09-05 NOTE — Telephone Encounter (Signed)
Patient come in for a follow up and notified of the results.

## 2016-09-15 ENCOUNTER — Other Ambulatory Visit: Payer: Self-pay | Admitting: Family Medicine

## 2016-10-07 DIAGNOSIS — H6123 Impacted cerumen, bilateral: Secondary | ICD-10-CM | POA: Diagnosis not present

## 2016-10-16 ENCOUNTER — Ambulatory Visit (INDEPENDENT_AMBULATORY_CARE_PROVIDER_SITE_OTHER): Payer: Medicare Other | Admitting: Family Medicine

## 2016-10-16 ENCOUNTER — Encounter: Payer: Self-pay | Admitting: Family Medicine

## 2016-10-16 VITALS — BP 130/78 | HR 88 | Temp 98.6°F | Wt 176.0 lb

## 2016-10-16 DIAGNOSIS — H6122 Impacted cerumen, left ear: Secondary | ICD-10-CM

## 2016-10-16 MED ORDER — FLUTICASONE PROPIONATE 50 MCG/ACT NA SUSP: 2.0000 | Freq: Two times a day (BID) | NASAL | 6 refills | Status: DC

## 2016-10-16 MED ORDER — PSEUDOEPHEDRINE HCL 30 MG PO TABS: 30.0000 mg | ORAL_TABLET | Freq: Four times a day (QID) | ORAL | 0 refills | Status: DC | PRN

## 2016-10-16 NOTE — Progress Notes (Signed)
   BP 130/78   Pulse 88   Temp 98.6 F (37 C)   Wt 176 lb (79.8 kg)   SpO2 96%   BMI 27.16 kg/m    Subjective:    Patient ID: Rick [name], male    DOB: 07-17-1941, 76 y.o.   MRN: [id number]  HPI: [name] is a 76 y.o. male  Chief Complaint  Patient presents with  . Ear Pain    right ear x 4 days. Had wax removed in it Friday but it is still hurting. They couldn't get the wax outof his left ear but it is not hurting.    Patient presents with 4 days of right ear pain. Went to UC 3 days ago and had some wax removed which helped some, but still having some pain/pressure in right ear. UC provider also states they couldn't remove the remaining bit of wax in left ear. He has been using OTC wax softeners daily. Denies fever, chills, discharge from ears, rhinorrhea, sore throat.   Relevant past medical, surgical, family and social history reviewed and updated as indicated. Interim medical history since our last visit reviewed. Allergies and medications reviewed and updated.  Review of Systems  Constitutional: Negative.   HENT: Positive for ear pain.   Eyes: Negative.   Respiratory: Negative.   Cardiovascular: Negative.   Gastrointestinal: Negative.   Genitourinary: Negative.   Musculoskeletal: Negative.   Neurological: Negative.   Psychiatric/Behavioral: Negative.     Per HPI unless specifically indicated above     Objective:    BP 130/78   Pulse 88   Temp 98.6 F (37 C)   Wt 176 lb (79.8 kg)   SpO2 96%   BMI 27.16 kg/m   Wt Readings from Last 3 Encounters:  10/16/16 176 lb (79.8 kg)  09/05/16 172 lb 6.4 oz (78.2 kg)  08/29/16 173 lb 6.4 oz (78.7 kg)    Physical Exam  Constitutional: He is oriented to person, place, and time. He appears well-developed and well-nourished.  HENT:  Head: Atraumatic.  Nose: Nose normal.  Mouth/Throat: Oropharynx is clear and moist.  Left EAC with moderate cerumen impaction Right middle ear effusion  Eyes: Conjunctivae  are normal. Pupils are equal, round, and reactive to light.  Neck: Normal range of motion. Neck supple.  Cardiovascular: Normal rate and normal heart sounds.   Pulmonary/Chest: Effort normal. No respiratory distress.  Musculoskeletal: Normal range of motion.  Neurological: He is alert and oriented to person, place, and time.  Skin: Skin is warm and dry.  Psychiatric: He has a normal mood and affect. His behavior is normal.  Nursing note and vitals reviewed.  Procedure: Lavage and curettage of left ear cerumen impaction Ear was lavaged using warm water and peroxide mixture. A curette was used for further efforts with some success. Very firm layer of cerumen still persisting up against TM. Procedure was well tolerated with no immediate complications.      Assessment & Plan:   Problem List Items Addressed This Visit    None    Visit Diagnoses    Impacted cerumen of left ear    -  Primary   Lavage and curetage performed with partial clearance of impaction, continue daily or twice daily use of softening drops. Will f/u with ENT if no success    Discussed sudafed and flonase for right middle ear effusion.  Follow up plan: Return for as scheduled.

## 2016-10-17 NOTE — Patient Instructions (Signed)
Follow up as scheduled.  

## 2016-11-06 ENCOUNTER — Other Ambulatory Visit: Payer: Self-pay | Admitting: Family Medicine

## 2016-11-06 DIAGNOSIS — G64 Other disorders of peripheral nervous system: Secondary | ICD-10-CM

## 2016-11-06 NOTE — Telephone Encounter (Signed)
Routing to provider. Appt on 12/07/16.

## 2016-11-20 ENCOUNTER — Other Ambulatory Visit: Payer: Self-pay | Admitting: Family Medicine

## 2016-11-20 DIAGNOSIS — E785 Hyperlipidemia, unspecified: Secondary | ICD-10-CM

## 2016-11-20 NOTE — Telephone Encounter (Signed)
Routing to provider. Appt on 12/07/16.

## 2016-11-27 ENCOUNTER — Other Ambulatory Visit: Payer: Self-pay | Admitting: Family Medicine

## 2016-11-27 MED ORDER — INSULIN GLARGINE 100 UNIT/ML SOLOSTAR PEN: 15.0000 [IU] | PEN_INJECTOR | Freq: Every day | SUBCUTANEOUS | 1 refills | Status: DC

## 2016-12-04 ENCOUNTER — Telehealth: Payer: Self-pay | Admitting: Family Medicine

## 2016-12-04 NOTE — Telephone Encounter (Signed)
Spoke with patient about labs that will be done at his appointment.

## 2016-12-04 NOTE — Telephone Encounter (Signed)
Patient has an appt on 4/26 and needs to know if they will be getting labs done that day.  Please advise.  Thanks

## 2016-12-07 ENCOUNTER — Encounter: Payer: Self-pay | Admitting: Family Medicine

## 2016-12-07 ENCOUNTER — Ambulatory Visit (INDEPENDENT_AMBULATORY_CARE_PROVIDER_SITE_OTHER): Payer: Medicare Other | Admitting: Family Medicine

## 2016-12-07 VITALS — BP 143/78 | HR 85 | Temp 98.5°F | Ht 68.0 in | Wt 172.1 lb

## 2016-12-07 DIAGNOSIS — H9193 Unspecified hearing loss, bilateral: Secondary | ICD-10-CM

## 2016-12-07 DIAGNOSIS — F1021 Alcohol dependence, in remission: Secondary | ICD-10-CM

## 2016-12-07 DIAGNOSIS — Z87891 Personal history of nicotine dependence: Secondary | ICD-10-CM | POA: Insufficient documentation

## 2016-12-07 DIAGNOSIS — N400 Enlarged prostate without lower urinary tract symptoms: Secondary | ICD-10-CM | POA: Diagnosis not present

## 2016-12-07 DIAGNOSIS — G4700 Insomnia, unspecified: Secondary | ICD-10-CM | POA: Diagnosis not present

## 2016-12-07 DIAGNOSIS — E782 Mixed hyperlipidemia: Secondary | ICD-10-CM

## 2016-12-07 DIAGNOSIS — F3170 Bipolar disorder, currently in remission, most recent episode unspecified: Secondary | ICD-10-CM

## 2016-12-07 DIAGNOSIS — C669 Malignant neoplasm of unspecified ureter: Secondary | ICD-10-CM

## 2016-12-07 DIAGNOSIS — Z8719 Personal history of other diseases of the digestive system: Secondary | ICD-10-CM | POA: Insufficient documentation

## 2016-12-07 DIAGNOSIS — N401 Enlarged prostate with lower urinary tract symptoms: Secondary | ICD-10-CM | POA: Diagnosis not present

## 2016-12-07 DIAGNOSIS — N138 Other obstructive and reflux uropathy: Secondary | ICD-10-CM

## 2016-12-07 DIAGNOSIS — Z85038 Personal history of other malignant neoplasm of large intestine: Secondary | ICD-10-CM | POA: Diagnosis not present

## 2016-12-07 DIAGNOSIS — N529 Male erectile dysfunction, unspecified: Secondary | ICD-10-CM

## 2016-12-07 DIAGNOSIS — G64 Other disorders of peripheral nervous system: Secondary | ICD-10-CM | POA: Diagnosis not present

## 2016-12-07 DIAGNOSIS — Z0000 Encounter for general adult medical examination without abnormal findings: Secondary | ICD-10-CM

## 2016-12-07 DIAGNOSIS — F329 Major depressive disorder, single episode, unspecified: Secondary | ICD-10-CM

## 2016-12-07 DIAGNOSIS — E1142 Type 2 diabetes mellitus with diabetic polyneuropathy: Secondary | ICD-10-CM | POA: Diagnosis not present

## 2016-12-07 DIAGNOSIS — N411 Chronic prostatitis: Secondary | ICD-10-CM

## 2016-12-07 DIAGNOSIS — H9190 Unspecified hearing loss, unspecified ear: Secondary | ICD-10-CM | POA: Insufficient documentation

## 2016-12-07 DIAGNOSIS — C679 Malignant neoplasm of bladder, unspecified: Secondary | ICD-10-CM

## 2016-12-07 DIAGNOSIS — E785 Hyperlipidemia, unspecified: Secondary | ICD-10-CM

## 2016-12-07 DIAGNOSIS — I10 Essential (primary) hypertension: Secondary | ICD-10-CM

## 2016-12-07 DIAGNOSIS — A6001 Herpesviral infection of penis: Secondary | ICD-10-CM

## 2016-12-07 DIAGNOSIS — F419 Anxiety disorder, unspecified: Secondary | ICD-10-CM

## 2016-12-07 LAB — UA/M W/RFLX CULTURE, ROUTINE
Bilirubin, UA: NEGATIVE
Glucose, UA: NEGATIVE
Ketones, UA: NEGATIVE
Leukocytes, UA: NEGATIVE
Nitrite, UA: NEGATIVE
PH UA: 5 (ref 5.0–7.5)
Protein, UA: NEGATIVE
RBC, UA: NEGATIVE
Specific Gravity, UA: 1.01 (ref 1.005–1.030)
UUROB: 0.2 mg/dL (ref 0.2–1.0)

## 2016-12-07 LAB — MICROSCOPIC EXAMINATION
Bacteria, UA: NONE SEEN
RBC, UA: NONE SEEN /hpf (ref 0–?)

## 2016-12-07 LAB — BAYER DCA HB A1C WAIVED: HB A1C: 6.1 % (ref <7.0)

## 2016-12-07 LAB — MICROALBUMIN, URINE WAIVED
Creatinine, Urine Waived: 50 mg/dL (ref 10–300)
Microalb, Ur Waived: 10 mg/L (ref 0–19)

## 2016-12-07 MED ORDER — CARVEDILOL 6.25 MG PO TABS: 6.2500 mg | ORAL_TABLET | Freq: Two times a day (BID) | ORAL | 1 refills | Status: DC

## 2016-12-07 MED ORDER — LOVASTATIN 40 MG PO TABS: 40.0000 mg | ORAL_TABLET | Freq: Every day | ORAL | 1 refills | Status: DC

## 2016-12-07 MED ORDER — TRAZODONE HCL 100 MG PO TABS: 100.0000 mg | ORAL_TABLET | Freq: Every day | ORAL | 1 refills | Status: DC

## 2016-12-07 MED ORDER — HYDROCHLOROTHIAZIDE 25 MG PO TABS: ORAL_TABLET | ORAL | 1 refills | Status: DC

## 2016-12-07 MED ORDER — VALACYCLOVIR HCL 1 G PO TABS: 1000.0000 mg | ORAL_TABLET | Freq: Every day | ORAL | 1 refills | Status: DC | PRN

## 2016-12-07 MED ORDER — DULAGLUTIDE 0.75 MG/0.5ML ~~LOC~~ SOAJ: 0.7500 mg | SUBCUTANEOUS | 1 refills | Status: DC

## 2016-12-07 MED ORDER — ENALAPRIL MALEATE 10 MG PO TABS: 10.0000 mg | ORAL_TABLET | Freq: Every day | ORAL | 1 refills | Status: DC

## 2016-12-07 MED ORDER — INSULIN GLARGINE 100 UNIT/ML SOLOSTAR PEN: 15.0000 [IU] | PEN_INJECTOR | Freq: Every day | SUBCUTANEOUS | 1 refills | Status: DC

## 2016-12-07 MED ORDER — FLUTICASONE PROPIONATE 50 MCG/ACT NA SUSP: 2.0000 | Freq: Two times a day (BID) | NASAL | 6 refills | Status: DC

## 2016-12-07 MED ORDER — GABAPENTIN 300 MG PO CAPS: 300.0000 mg | ORAL_CAPSULE | Freq: Two times a day (BID) | ORAL | 1 refills | Status: DC

## 2016-12-07 MED ORDER — SILDENAFIL CITRATE 100 MG PO TABS: 50.0000 mg | ORAL_TABLET | Freq: Every day | ORAL | 11 refills | Status: DC | PRN

## 2016-12-07 MED ORDER — FINASTERIDE 5 MG PO TABS: 5.0000 mg | ORAL_TABLET | Freq: Every day | ORAL | 1 refills | Status: DC

## 2016-12-07 MED ORDER — METFORMIN HCL 1000 MG PO TABS: ORAL_TABLET | ORAL | 1 refills | Status: DC

## 2016-12-07 MED ORDER — MIRTAZAPINE 15 MG PO TABS: 15.0000 mg | ORAL_TABLET | Freq: Every day | ORAL | 1 refills | Status: DC

## 2016-12-07 NOTE — Assessment & Plan Note (Signed)
Has not seen his urologist in some time. Will get him back in. Referral generated today.

## 2016-12-07 NOTE — Assessment & Plan Note (Signed)
Stable on current regimen. Continue current regimen. Continue to monitor.  

## 2016-12-07 NOTE — Assessment & Plan Note (Signed)
Does not want to see audiology right now. Call with any concerns.

## 2016-12-07 NOTE — Assessment & Plan Note (Addendum)
Smoked 1.5ppd for 15 years, quit in 2006. Note sent to [facility] today to consider low dose CT screening.

## 2016-12-07 NOTE — Assessment & Plan Note (Signed)
Claims he is not having a problem. Continue to monitor.

## 2016-12-07 NOTE — Progress Notes (Signed)
BP (!) 143/78 (BP Location: Left Arm, Cuff Size: Normal)   Pulse 85   Temp 98.5 F (36.9 C)   Ht '5\' 8"'  (1.727 m)   Wt 172 lb 1.6 oz (78.1 kg)   SpO2 97%   BMI 26.17 kg/m    Subjective:    Patient ID: SAJJAD HONEA, male    DOB: 06-02-1941, 76 y.o.   MRN: [id number]  HPI: Rick [name] is a 76 y.o. male presenting on 12/07/2016 for comprehensive medical examination. Current medical complaints include:  DIABETES Hypoglycemic episodes:no Polydipsia/polyuria: no Visual disturbance: no Chest pain: no Paresthesias: yes Glucose Monitoring: yes  Accucheck frequency: Daily  Fasting glucose: 140-180 Taking Insulin?: yes Blood Pressure Monitoring: not checking Retinal Examination: Up to Date Foot Exam: Up to Date Diabetic Education: Completed Pneumovax: Refused Influenza: Refused Aspirin: no  HYPERTENSION / HYPERLIPIDEMIA Satisfied with current treatment? yes Duration of hypertension: chronic BP monitoring frequency: not checking BP medication side effects: no Past BP meds: hctz, enlalopril, carvedilol Duration of hyperlipidemia: chronic Cholesterol medication side effects: no Cholesterol supplements: none Past cholesterol medications: lovastatin (mevacor) Medication compliance: excellent compliance Aspirin: no Recent stressors: no Recurrent headaches: no Visual changes: no Palpitations: no Dyspnea: no Chest pain: no Lower extremity edema: no Dizzy/lightheaded: no  BIPOLAR Mood status: stable Satisfied with current treatment?: yes Symptom severity: moderate  Duration of current treatment : chronic Side effects: no Medication compliance: excellent compliance Psychotherapy/counseling: no  Previous psychiatric medications:  Depressed mood: no Anxious mood: no Anhedonia: no Significant weight loss or gain: no Insomnia: no  Fatigue: no Feelings of worthlessness or guilt: no Impaired concentration/indecisiveness: no Suicidal ideations: no Hopelessness:  no Crying spells: no Depression screen [facility] 2/9 12/07/2016 09/05/2016 08/05/2015 05/25/2015 05/18/2015  Decreased Interest 0 0 0 0 0  Down, Depressed, Hopeless 0 0 1 0 0  PHQ - 2 Score 0 0 1 0 0    He currently lives with: Alone Interim Problems from his last visit: no  Functional Status Survey: Is the patient deaf or have difficulty hearing?: Yes Does the patient have difficulty seeing, even when wearing glasses/contacts?: No Does the patient have difficulty concentrating, remembering, or making decisions?: No Does the patient have difficulty walking or climbing stairs?: No Does the patient have difficulty dressing or bathing?: No Does the patient have difficulty doing errands alone such as visiting a doctor's office or shopping?: No  FALL RISK: Fall Risk  12/07/2016 09/05/2016 08/05/2015 05/25/2015 05/18/2015  Falls in the past year? Yes No No No No  Number falls in past yr: 1 - - - -  Injury with Fall? No - - - -    Depression Screen Depression screen [facility] 2/9 12/07/2016 09/05/2016 08/05/2015 05/25/2015 05/18/2015  Decreased Interest 0 0 0 0 0  Down, Depressed, Hopeless 0 0 1 0 0  PHQ - 2 Score 0 0 1 0 0    Advanced Directives See Appropriate Area of the Chart  Past Medical History:  Past Medical History:  Diagnosis Date  . Anxiety   . Bipolar disorder ([place])   . Bladder cancer ([place])   . BPH (benign prostatic hypertrophy)   . Chest pain, atypical 06/23/2015  . Depression   . Diabetes mellitus without complication ([place])   . Difficult or painful urination 10/31/2012  . Dysrhythmia   . Ear problem 03/24/2015  . Gangrenous appendicitis   . H/O urinary disorder 03/27/2013  . Herpes genitalis in men   . Hyperlipidemia   . Hypertension   .  Insomnia   . Kidney stones   . Left hand pain 05/18/2015  . Mass of arm 02/10/2015  . Peripheral neuropathy   . Skin cancer   . UD (urethral discharge) 10/31/2012    Surgical History:  Past Surgical History:  Procedure Laterality Date  .  APPENDECTOMY     RUPTURED  . BLADDER SURGERY    . CHOLECYSTECTOMY    . COLOSTOMY     AND LATER CLOSURE  . FRACTURE SURGERY    . HERNIA REPAIR    . INNER EAR SURGERY Left   . ORIF ANKLE FRACTURE Left 09/27/2015   Procedure: OPEN REDUCTION INTERNAL FIXATION (ORIF) ANKLE FRACTURE;  Surgeon: Corky Mull, MD;  Location: [facility] ORS;  Service: Orthopedics;  Laterality: Left;  . SKIN CANCER EXCISION    . VENTRAL HERNIA REPAIR N/A 01/14/2016   Procedure: HERNIA REPAIR VENTRAL ADULT;  Surgeon: Leonie Green, MD;  Location: [facility] ORS;  Service: General;  Laterality: N/A;    Medications:  Current Outpatient Prescriptions on File Prior to Visit  Medication Sig  . Blood Glucose Monitoring Suppl (ONE TOUCH ULTRA SYSTEM KIT) w/Device KIT 1 kit by Does not apply route once.  . carvedilol (COREG) 6.25 MG tablet Take 1 tablet (6.25 mg total) by mouth 2 (two) times daily with a meal.  . Dulaglutide (TRULICITY) 9.44 HQ/7.5FF SOPN Inject 0.75 mg into the skin once a week.  . enalapril (VASOTEC) 10 MG tablet Take 1 tablet (10 mg total) by mouth daily.  . finasteride (PROSCAR) 5 MG tablet Take 1 tablet (5 mg total) by mouth daily.  . fluticasone (FLONASE) 50 MCG/ACT nasal spray Place 2 sprays into both nostrils 2 (two) times daily.  [name] gabapentin (NEURONTIN) 300 MG capsule TAKE ONE CAPSULE BY MOUTH TWICE A DAY  . glucose blood (ONE TOUCH ULTRA TEST) test strip 1 each by Other route 3 (three) times daily. Use as instructed  . hydrochlorothiazide (HYDRODIURIL) 25 MG tablet TAKE ONE (1) TABLET EACH DAY  . Insulin Glargine (LANTUS SOLOSTAR) 100 UNIT/ML Solostar Pen Inject 15 Units into the skin daily at 10 pm.  . Lancets (ONETOUCH ULTRASOFT) lancets USE TO TEST BLOOD SUGAR TWICE A DAY AS DIRCTED  . lovastatin (MEVACOR) 40 MG tablet TAKE 1 TABLET BY MOUTH EVERY DAY.  . metFORMIN (GLUCOPHAGE) 1000 MG tablet TAKE 1 TABLET BY MOUTH 2 TIMES DAILY WITH A MEAL  . mirtazapine (REMERON) 15 MG tablet TAKE ONE TABLET BY  MOUTH AT BEDTIME.  [name] pseudoephedrine (SUDAFED) 30 MG tablet Take 1 tablet (30 mg total) by mouth every 6 (six) hours as needed for congestion.  . sildenafil (VIAGRA) 100 MG tablet Take 0.5-1 tablets (50-100 mg total) by mouth daily as needed for erectile dysfunction.  . traZODone (DESYREL) 100 MG tablet TAKE ONE TABLET BY MOUTH AT BEDTIME.  . valACYclovir (VALTREX) 1000 MG tablet Take 1 tablet (1,000 mg total) by mouth daily as needed.   No current facility-administered medications on file prior to visit.     Allergies:  Allergies  Allergen Reactions  . Haloperidol     Other reaction(s): Unknown  . Sulfa Antibiotics     Other reaction(s): OTHER    Social History:  Social History   Social History  . Marital status: Divorced    Spouse name: N/A  . Number of children: N/A  . Years of education: N/A   Occupational History  . Not on file.   Social History Main Topics  . Smoking status: Former Smoker  Packs/day: 1.50    Years: 15.00    Types: Cigarettes    Start date: 08/14/1994    Quit date: 03/23/2005  . Smokeless tobacco: Former Systems developer    Types: Snuff, Chew    Quit date: 03/23/2010  . Alcohol use No  . Drug use: No  . Sexual activity: No   Other Topics Concern  . Not on file   Social History Narrative  . No narrative on file   History  Smoking Status  . Former Smoker  . Packs/day: 1.50  . Years: 15.00  . Types: Cigarettes  . Start date: 08/14/1994  . Quit date: 03/23/2005  Smokeless Tobacco  . Former Systems developer  . Types: Snuff, Chew  . Quit date: 03/23/2010   History  Alcohol Use No    Family History:  Family History  Problem Relation Age of Onset  . Transient ischemic attack Mother   . Diabetes Father   . Cancer Brother     Past medical history, surgical history, medications, allergies, family history and social history reviewed with patient today and changes made to appropriate areas of the chart.   Review of Systems  Constitutional: Negative.   HENT:  Positive for hearing loss. Negative for congestion, ear discharge, ear pain, nosebleeds, sinus pain, sore throat and tinnitus.   Eyes: Positive for blurred vision (cataract). Negative for double vision, photophobia, pain, discharge and redness.  Respiratory: Negative.  Negative for stridor.   Cardiovascular: Negative.   Gastrointestinal: Negative.   Genitourinary: Negative.   Musculoskeletal: Negative.   Skin: Negative.   Neurological: Positive for tingling. Negative for dizziness, tremors, sensory change, speech change, focal weakness, seizures, loss of consciousness and headaches.  Endo/Heme/Allergies: Negative.   Psychiatric/Behavioral: Negative.     All other ROS negative except what is listed above and in the HPI.      Objective:    BP (!) 143/78 (BP Location: Left Arm, Cuff Size: Normal)   Pulse 85   Temp 98.5 F (36.9 C)   Ht '5\' 8"'  (1.727 m)   Wt 172 lb 1.6 oz (78.1 kg)   SpO2 97%   BMI 26.17 kg/m   Wt Readings from Last 3 Encounters:  12/07/16 172 lb 1.6 oz (78.1 kg)  10/16/16 176 lb (79.8 kg)  09/05/16 172 lb 6.4 oz (78.2 kg)    Physical Exam  Constitutional: He is oriented to person, place, and time. He appears well-developed and well-nourished. No distress.  HENT:  Head: Normocephalic and atraumatic.  Right Ear: Hearing, tympanic membrane, external ear and ear canal normal.  Left Ear: Hearing, external ear and ear canal normal.  Nose: Nose normal.  Mouth/Throat: Uvula is midline, oropharynx is clear and moist and mucous membranes are normal. No oropharyngeal exudate.  Cerumen impaction on the L  Eyes: Conjunctivae, EOM and lids are normal. Pupils are equal, round, and reactive to light. Right eye exhibits no discharge. Left eye exhibits no discharge. No scleral icterus.  Neck: Normal range of motion. Neck supple. No JVD present. No tracheal deviation present. No thyromegaly present.  Cardiovascular: Normal rate, regular rhythm, normal heart sounds and intact  distal pulses.  Exam reveals no gallop and no friction rub.   No murmur heard. Pulmonary/Chest: Effort normal and breath sounds normal. No stridor. No respiratory distress. He has no wheezes. He has no rales. He exhibits no tenderness.  Abdominal: Soft. Bowel sounds are normal. He exhibits no distension and no mass. There is no tenderness. There is no rebound and no  guarding.  Genitourinary:  Genitourinary Comments: Penis and rectal exams deferred- done at Urology, will make sure that he's back in to see them.   Musculoskeletal: Normal range of motion. He exhibits no edema, tenderness or deformity.  Lymphadenopathy:    He has no cervical adenopathy.  Neurological: He is alert and oriented to person, place, and time. He has normal reflexes. He displays normal reflexes. No cranial nerve deficit. He exhibits normal muscle tone. Coordination normal.  Skin: Skin is warm, dry and intact. No rash noted. He is not diaphoretic. No erythema. No pallor.  Psychiatric: He has a normal mood and affect. His speech is normal and behavior is normal. Judgment and thought content normal. Cognition and memory are normal.  Nursing note and vitals reviewed.   6CIT Screen 12/07/2016  What Year? 0 points  What month? 0 points  What time? 3 points  Count back from 20 0 points  Months in reverse 0 points  Repeat phrase 4 points  Total Score 7     Results for orders placed or performed in visit on 09/07/16  HM DIABETES EYE EXAM  Result Value Ref Range   HM Diabetic Eye Exam No Retinopathy No Retinopathy      Assessment & Plan:   Problem List Items Addressed This Visit      Cardiovascular and Mediastinum   Hypertension    Stable. Continue current regimen. Continue to monitor.       Relevant Orders   CBC with Differential/Platelet   Comprehensive metabolic panel   Microalbumin, Urine Waived   TSH   UA/M w/rflx Culture, Routine     Endocrine   Diabetic peripheral neuropathy associated with type 2  diabetes mellitus (HCC)    Stable, A1c came back at 6.1. Continue current regimen. Continue to monitor.       Relevant Orders   CBC with Differential/Platelet   Comprehensive metabolic panel   Bayer DCA Hb A1c Waived   TSH   UA/M w/rflx Culture, Routine     Nervous and Auditory   Hard of hearing    Does not want to see audiology right now. Call with any concerns.         Genitourinary   Benign prostatic hyperplasia with urinary obstruction    Has not seen his urologist in some time. Will get him back in. Referral generated today.      Malignant neoplasm of urinary bladder ([place])    Has not seen his urologist in some time. Will get him back in. Referral generated today.      Relevant Orders   Ambulatory referral to Urology   Chronic prostatitis    Has not seen his urologist in some time. Will get him back in. Referral generated today.      Herpesviral infection of penis    Has not seen his urologist in some time. Will get him back in. Referral generated today.      ED (erectile dysfunction) of organic origin    Has not seen his urologist in some time. Will get him back in. Referral generated today.      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 Will get him back in. Referral generated today.      Relevant Orders   Ambulatory referral to Urology     Other   Anxiety and depression    Stable on current regimen. Continue current regimen. Continue to monitor.  Affective bipolar disorder ([place])    Stable on current regimen. Continue current regimen. Continue to monitor.       Relevant Orders   CBC with Differential/Platelet   Comprehensive metabolic panel   TSH   UA/M w/rflx Culture, Routine   Insomnia    Stable on current regimen. Continue current regimen. Continue to monitor.       Hyperlipidemia    Stable on current regimen. Continue current regimen. Continue to monitor.       Relevant Orders   CBC with  Differential/Platelet   Comprehensive metabolic panel   Lipid Panel w/o Chol/HDL Ratio   TSH   UA/M w/rflx Culture, Routine   History of alcoholism ([place])    Claims he is not having a problem. Continue to monitor.       History of colon cancer, no staging    Has not seen GI. Unclear of information regarding previous colon cancer diagnosis. Referral back to GI made today.      Relevant Orders   Ambulatory referral to Gastroenterology   History of tobacco abuse    Smoked 1.5ppd for 15 years, quit in 2006. Note sent to [facility] today to consider low dose CT screening.       History of ulcerative colitis    Has not seen GI in a long time- will get him back in. Referral generated today.      Relevant Orders   Ambulatory referral to Gastroenterology    Other Visit Diagnoses    Medicare annual wellness visit, subsequent    -  Primary       Preventative Services:  Health Risk Assessment and Personalized Prevention Plan: Done today Bone Mass Measurements: N/A CVD Screening: Done today Colon Cancer Screening: Order put in today Depression Screening: Done today Diabetes Screening: Done today Glaucoma Screening: See your eye doctor Hepatitis B vaccine: N/A Hepatitis C screening: N/A HIV Screening: N/A Flu Vaccine: Get in October Lung cancer Screening: Ordered today Obesity Screening: Done today Pneumonia Vaccines (2): Declined STI Screening: N/A PSA screening: Done today  Discussed aspirin prophylaxis for myocardial infarction prevention and decision was made to start ASA  LABORATORY TESTING:  Health maintenance labs ordered today as discussed above.   The natural history of prostate cancer and ongoing controversy regarding screening and potential treatment outcomes of prostate cancer has been discussed with the patient. The meaning of a false positive PSA and a false negative PSA has been discussed. He indicates understanding of the limitations of this screening test  and wishes to proceed with screening PSA testing.   IMMUNIZATIONS:   - Tdap: Tetanus vaccination status reviewed: Declined. - Influenza: Refused - Pneumovax: Refused - Prevnar: Refused - Zostavax vaccine: Refused  SCREENING: - Colonoscopy: Ordered today  Discussed with patient purpose of the colonoscopy is to detect colon cancer at curable precancerous or early stages   PATIENT COUNSELING:    Sexuality: Discussed sexually transmitted diseases, partner selection, use of condoms, avoidance of unintended pregnancy  and contraceptive alternatives.   Advised to avoid cigarette smoking.  I discussed with the patient that most people either abstain from alcohol or drink within safe limits (<=14/week and <=4 drinks/occasion for males, <=7/weeks and <= 3 drinks/occasion for females) and that the risk for alcohol disorders and other health effects rises proportionally with the number of drinks per week and how often a drinker exceeds daily limits.  Discussed cessation/primary prevention of drug use and availability of treatment for abuse.   Diet: Encouraged to  adjust caloric intake to maintain  or achieve ideal body weight, to reduce intake of dietary saturated fat and total fat, to limit sodium intake by avoiding high sodium foods and not adding table salt, and to maintain adequate dietary potassium and calcium preferably from fresh fruits, vegetables, and low-fat dairy products.    stressed the importance of regular exercise  Injury prevention: Discussed safety belts, safety helmets, smoke detector, smoking near bedding or upholstery.   Dental health: Discussed importance of regular tooth brushing, flossing, and dental visits.   Follow up plan: NEXT PREVENTATIVE PHYSICAL DUE IN 1 YEAR. Return in about 3 months (around 03/08/2017) for Diabetes follow up.

## 2016-12-07 NOTE — Assessment & Plan Note (Signed)
Stable. Continue current regimen. Continue to monitor.  

## 2016-12-07 NOTE — Assessment & Plan Note (Signed)
Has not seen GI. Unclear of information regarding previous colon cancer diagnosis. Referral back to GI made today.

## 2016-12-07 NOTE — Assessment & Plan Note (Signed)
Has not seen GI in a long time- will get him back in. Referral generated today.

## 2016-12-07 NOTE — Assessment & Plan Note (Addendum)
Stable, A1c came back at 6.1. Continue current regimen. Continue to monitor.

## 2016-12-07 NOTE — Patient Instructions (Addendum)
Preventative Services:  Health Risk Assessment and Personalized Prevention Plan: Done today Bone Mass Measurements: N/A CVD Screening: Done today Colon Cancer Screening: Order put in today Depression Screening: Done today Diabetes Screening: Done today Glaucoma Screening: See your eye doctor Hepatitis B vaccine: N/A Hepatitis C screening: N/A HIV Screening: N/A Flu Vaccine: Get in October Lung cancer Screening: Ordered today Obesity Screening: Done today Pneumonia Vaccines (2): Declined STI Screening: N/A PSA screening: Done today Health Maintenance, Male A healthy lifestyle and preventive care is important for your health and wellness. Ask your health care provider about what schedule of regular examinations is right for you. What should I know about weight and diet?  Eat a Healthy Diet  Eat plenty of vegetables, fruits, whole grains, low-fat dairy products, and lean protein.  Do not eat a lot of foods high in solid fats, added sugars, or salt. Maintain a Healthy Weight  Regular exercise can help you achieve or maintain a healthy weight. You should:  Do at least 150 minutes of exercise each week. The exercise should increase your heart rate and make you sweat (moderate-intensity exercise).  Do strength-training exercises at least twice a week. Watch Your Levels of Cholesterol and Blood Lipids  Have your blood tested for lipids and cholesterol every 5 years starting at 76 years of age. If you are at high risk for heart disease, you should start having your blood tested when you are 76 years old. You may need to have your cholesterol levels checked more often if:  Your lipid or cholesterol levels are high.  You are older than 76 years of age.  You are at high risk for heart disease. What should I know about cancer screening? Many types of cancers can be detected early and may often be prevented. Lung Cancer  You should be screened every year for lung cancer if:  You are a  current smoker who has smoked for at least 30 years.  You are a former smoker who has quit within the past 15 years.  Talk to your health care provider about your screening options, when you should start screening, and how often you should be screened. Colorectal Cancer  Routine colorectal cancer screening usually begins at 76 years of age and should be repeated every 5-10 years until you are 76 years old. You may need to be screened more often if early forms of precancerous polyps or small growths are found. Your health care provider may recommend screening at an earlier age if you have risk factors for colon cancer.  Your health care provider may recommend using home test kits to check for hidden blood in the stool.  A small camera at the end of a tube can be used to examine your colon (sigmoidoscopy or colonoscopy). This checks for the earliest forms of colorectal cancer. Prostate and Testicular Cancer  Depending on your age and overall health, your health care provider may do certain tests to screen for prostate and testicular cancer.  Talk to your health care provider about any symptoms or concerns you have about testicular or prostate cancer. Skin Cancer  Check your skin from head to toe regularly.  Tell your health care provider about any new moles or changes in moles, especially if:  There is a change in a mole's size, shape, or color.  You have a mole that is larger than a pencil eraser.  Always use sunscreen. Apply sunscreen liberally and repeat throughout the day.  Protect yourself by wearing long  sleeves, pants, a wide-brimmed hat, and sunglasses when outside. What should I know about heart disease, diabetes, and high blood pressure?  If you are 70-74 years of age, have your blood pressure checked every 3-5 years. If you are 5 years of age or older, have your blood pressure checked every year. You should have your blood pressure measured twice-once when you are at a  hospital or clinic, and once when you are not at a hospital or clinic. Record the average of the two measurements. To check your blood pressure when you are not at a hospital or clinic, you can use:  An automated blood pressure machine at a pharmacy.  A home blood pressure monitor.  Talk to your health care provider about your target blood pressure.  If you are between 55-31 years old, ask your health care provider if you should take aspirin to prevent heart disease.  Have regular diabetes screenings by checking your fasting blood sugar level.  If you are at a normal weight and have a low risk for diabetes, have this test once every three years after the age of 22.  If you are overweight and have a high risk for diabetes, consider being tested at a younger age or more often.  A one-time screening for abdominal aortic aneurysm (AAA) by ultrasound is recommended for men aged 43-75 years who are current or former smokers. What should I know about preventing infection? Hepatitis B  If you have a higher risk for hepatitis B, you should be screened for this virus. Talk with your health care provider to find out if you are at risk for hepatitis B infection. Hepatitis C  Blood testing is recommended for:  Everyone born from [birth year] through 1965.  Anyone with known risk factors for hepatitis C. Sexually Transmitted Diseases (STDs)  You should be screened each year for STDs including gonorrhea and chlamydia if:  You are sexually active and are younger than 76 years of age.  You are older than 76 years of age and your health care provider tells you that you are at risk for this type of infection.  Your sexual activity has changed since you were last screened and you are at an increased risk for chlamydia or gonorrhea. Ask your health care provider if you are at risk.  Talk with your health care provider about whether you are at high risk of being infected with HIV. Your health care provider may  recommend a prescription medicine to help prevent HIV infection. What else can I do?  Schedule regular health, dental, and eye exams.  Stay current with your vaccines (immunizations).  Do not use any tobacco products, such as cigarettes, chewing tobacco, and e-cigarettes. If you need help quitting, ask your health care provider.  Limit alcohol intake to no more than 2 drinks per day. One drink equals 12 ounces of beer, 5 ounces of wine, or 1 ounces of hard liquor.  Do not use street drugs.  Do not share needles.  Ask your health care provider for help if you need support or information about quitting drugs.  Tell your health care provider if you often feel depressed.  Tell your health care provider if you have ever been abused or do not feel safe at home. This information is not intended to replace advice given to you by your health care provider. Make sure you discuss any questions you have with your health care provider. Document Released: 01/27/2008 Document Revised: 03/29/2016 Document Reviewed: 05/04/2015 Elsevier  Education  2017 [employer] Inc.  

## 2016-12-08 ENCOUNTER — Encounter: Payer: Self-pay | Admitting: Family Medicine

## 2016-12-08 ENCOUNTER — Telehealth: Payer: Self-pay | Admitting: *Deleted

## 2016-12-08 LAB — LIPID PANEL W/O CHOL/HDL RATIO
CHOLESTEROL TOTAL: 171 mg/dL (ref 100–199)
HDL: 43 mg/dL (ref >39)
LDL Calculated: 91 mg/dL (ref 0–99)
TRIGLYCERIDES: 186 mg/dL — AB (ref 0–149)
VLDL Cholesterol Cal: 37 mg/dL (ref 5–40)

## 2016-12-08 LAB — CBC WITH DIFFERENTIAL/PLATELET
BASOS: 1 %
Basophils Absolute: 0.1 10*3/uL (ref 0.0–0.2)
EOS (ABSOLUTE): 0.4 10*3/uL (ref 0.0–0.4)
EOS: 5 %
HEMATOCRIT: 45.6 % (ref 37.5–51.0)
Hemoglobin: 15.5 g/dL (ref 13.0–17.7)
Immature Grans (Abs): 0 10*3/uL (ref 0.0–0.1)
Immature Granulocytes: 0 %
LYMPHS ABS: 2.8 10*3/uL (ref 0.7–3.1)
Lymphs: 32 %
MCH: 29 pg (ref 26.6–33.0)
MCHC: 34 g/dL (ref 31.5–35.7)
MCV: 85 fL (ref 79–97)
MONOS ABS: 0.6 10*3/uL (ref 0.1–0.9)
Monocytes: 7 %
Neutrophils Absolute: 4.7 10*3/uL (ref 1.4–7.0)
Neutrophils: 55 %
Platelets: 297 10*3/uL (ref 150–379)
RBC: 5.34 x10E6/uL (ref 4.14–5.80)
RDW: 14.8 % (ref 12.3–15.4)
WBC: 8.6 10*3/uL (ref 3.4–10.8)

## 2016-12-08 LAB — COMPREHENSIVE METABOLIC PANEL
A/G RATIO: 1.9 (ref 1.2–2.2)
ALK PHOS: 50 IU/L (ref 39–117)
ALT: 22 IU/L (ref 0–44)
AST: 23 IU/L (ref 0–40)
Albumin: 4.6 g/dL (ref 3.5–4.8)
BILIRUBIN TOTAL: 0.7 mg/dL (ref 0.0–1.2)
BUN/Creatinine Ratio: 10 (ref 10–24)
BUN: 10 mg/dL (ref 8–27)
CHLORIDE: 103 mmol/L (ref 96–106)
CO2: 23 mmol/L (ref 18–29)
Calcium: 9.9 mg/dL (ref 8.6–10.2)
Creatinine, Ser: 0.96 mg/dL (ref 0.76–1.27)
GFR calc Af Amer: 89 mL/min/{1.73_m2} (ref >59)
GFR, EST NON AFRICAN AMERICAN: 77 mL/min/{1.73_m2} (ref >59)
GLOBULIN, TOTAL: 2.4 g/dL (ref 1.5–4.5)
Glucose: 130 mg/dL — ABNORMAL HIGH (ref 65–99)
POTASSIUM: 4.1 mmol/L (ref 3.5–5.2)
SODIUM: 143 mmol/L (ref 134–144)
Total Protein: 7 g/dL (ref 6.0–8.5)

## 2016-12-08 LAB — TSH: TSH: 4.14 u[IU]/mL (ref 0.450–4.500)

## 2016-12-08 NOTE — Telephone Encounter (Signed)
Received referral for low dose lung cancer screening CT scan. Attempted to leave voicemail at phone number listed in EMR for patient to call me back to facilitate scheduling scan. However, this option is not available. Will attempt to call at later date.

## 2016-12-12 ENCOUNTER — Telehealth: Payer: Self-pay | Admitting: Family Medicine

## 2016-12-12 DIAGNOSIS — Z599 Problem related to housing and economic circumstances, unspecified: Secondary | ICD-10-CM

## 2016-12-12 DIAGNOSIS — Z598 Other problems related to housing and economic circumstances: Secondary | ICD-10-CM

## 2016-12-12 NOTE — Telephone Encounter (Signed)
Patient called and asked about medical advice for his A1C result. Patient wanted to be advised on understanding his A1C levels. Patient also called to see if someone could talk to him about some affordable medication options for his trulicity. Patient states that he is having trouble affording his trulicity. Please Advise.     Patient Contact number: [phone]  Thank you.

## 2016-12-13 NOTE — Telephone Encounter (Signed)
Please let him know that his A1c is good. If he likes I can refer him to diabetic education and they can discuss further his A1c with him. I can also refer him to social work to help with the cost. Let me know what he'd like to do.

## 2016-12-13 NOTE — Telephone Encounter (Signed)
Spoke with patient, he does not want to go to diabetic education.   Please put the referral for social work to help with Trulicity and Lantus.  If he cant get help he will need to go back to the Glipizide.  Patient would like a letter letting him know who will call, when and their number.

## 2016-12-14 NOTE — Telephone Encounter (Signed)
Order placed. I requested they send him a letter. I'm not sure they will know that information ahead of time to be able to get it to him by letter.

## 2016-12-26 ENCOUNTER — Encounter: Payer: Self-pay | Admitting: Family Medicine

## 2016-12-26 ENCOUNTER — Ambulatory Visit (INDEPENDENT_AMBULATORY_CARE_PROVIDER_SITE_OTHER): Payer: Medicare Other | Admitting: Family Medicine

## 2016-12-26 VITALS — BP 115/76 | HR 89 | Temp 98.2°F | Wt 172.2 lb

## 2016-12-26 DIAGNOSIS — R609 Edema, unspecified: Secondary | ICD-10-CM

## 2016-12-26 DIAGNOSIS — E1142 Type 2 diabetes mellitus with diabetic polyneuropathy: Secondary | ICD-10-CM | POA: Diagnosis not present

## 2016-12-26 MED ORDER — GABAPENTIN 300 MG PO CAPS: 600.0000 mg | ORAL_CAPSULE | Freq: Two times a day (BID) | ORAL | 1 refills | Status: DC

## 2016-12-26 NOTE — Assessment & Plan Note (Signed)
Will increase his gabapentin to 600mg  BID. Call with any concerns. Will continue current regimen. Will have social worker call to see about help in paying for his medicine.

## 2016-12-26 NOTE — Progress Notes (Signed)
BP 115/76 (BP Location: Right Arm, Patient Position: Sitting, Cuff Size: Normal)   Pulse 89   Temp 98.2 F (36.8 C)   Wt 172 lb 3.2 oz (78.1 kg)   SpO2 93%   BMI 26.18 kg/m    Subjective:    Patient ID: Rick [name], male    DOB: October 16, 1940, 76 y.o.   MRN: [id number]  HPI: Rick [name] is a 76 y.o. male  Chief Complaint  Patient presents with  . Diabetes  . Edema    left ankle   NEUROPATHY Neuropathy status: uncontrolled  Satisfied with current treatment?: no Medication side effects: Not on anything Location: bottom of his feet Pain: yes Severity: severe  Quality:  burning and pins and needles Frequency: intermittent Bilateral: yes Symmetric: yes Numbness: yes Decreased sensation: yes Weakness: no Context: stable Alleviating factors: gabapentin Aggravating factors: unknown Treatments attempted: gabapentin  Has been having swelling of his L anke- which is the one he has broken previously.  Relevant past medical, surgical, family and social history reviewed and updated as indicated. Interim medical history since our last visit reviewed. Allergies and medications reviewed and updated.  Review of Systems  Constitutional: Negative.   Respiratory: Negative.   Cardiovascular: Negative.   Musculoskeletal: Negative.   Neurological: Positive for numbness. Negative for dizziness, tremors, seizures, syncope, facial asymmetry, speech difficulty, weakness, light-headedness and headaches.  Psychiatric/Behavioral: Negative.     Per HPI unless specifically indicated above     Objective:    BP 115/76 (BP Location: Right Arm, Patient Position: Sitting, Cuff Size: Normal)   Pulse 89   Temp 98.2 F (36.8 C)   Wt 172 lb 3.2 oz (78.1 kg)   SpO2 93%   BMI 26.18 kg/m   Wt Readings from Last 3 Encounters:  12/26/16 172 lb 3.2 oz (78.1 kg)  12/07/16 172 lb 1.6 oz (78.1 kg)  10/16/16 176 lb (79.8 kg)    Physical Exam  Constitutional: He is oriented to person,  place, and time. He appears well-developed and well-nourished. No distress.  HENT:  Head: Normocephalic and atraumatic.  Right Ear: Hearing normal.  Left Ear: Hearing normal.  Nose: Nose normal.  Eyes: Conjunctivae and lids are normal. Right eye exhibits no discharge. Left eye exhibits no discharge. No scleral icterus.  Cardiovascular: Normal rate, regular rhythm, normal heart sounds and intact distal pulses.  Exam reveals no gallop and no friction rub.   No murmur heard. Pulmonary/Chest: Effort normal and breath sounds normal. No respiratory distress. He has no wheezes. He has no rales. He exhibits no tenderness.  Musculoskeletal: Normal range of motion.  Neurological: He is alert and oriented to person, place, and time.  Skin: Skin is warm, dry and intact. No rash noted. No erythema. No pallor.  Psychiatric: He has a normal mood and affect. His speech is normal and behavior is normal. Judgment and thought content normal. Cognition and memory are normal.  Nursing note and vitals reviewed.   Results for orders placed or performed in visit on 12/07/16  Microscopic Examination  Result Value Ref Range   WBC, UA 0-5 0 - 5 /hpf   RBC, UA None seen 0 - 2 /hpf   Epithelial Cells (non renal) 0-10 0 - 10 /hpf   Bacteria, UA None seen None seen/Few  CBC with Differential/Platelet  Result Value Ref Range   WBC 8.6 3.4 - 10.8 x10E3/uL   RBC 5.34 4.14 - 5.80 x10E6/uL   Hemoglobin 15.5 13.0 - 17.7 g/dL  Hematocrit 45.6 37.5 - 51.0 %   MCV 85 79 - 97 fL   MCH 29.0 26.6 - 33.0 pg   MCHC 34.0 31.5 - 35.7 g/dL   RDW 14.8 12.3 - 15.4 %   Platelets 297 150 - 379 x10E3/uL   Neutrophils 55 Not Estab. %   Lymphs 32 Not Estab. %   Monocytes 7 Not Estab. %   Eos 5 Not Estab. %   Basos 1 Not Estab. %   Neutrophils Absolute 4.7 1.4 - 7.0 x10E3/uL   Lymphocytes Absolute 2.8 0.7 - 3.1 x10E3/uL   Monocytes Absolute 0.6 0.1 - 0.9 x10E3/uL   EOS (ABSOLUTE) 0.4 0.0 - 0.4 x10E3/uL   Basophils Absolute 0.1  0.0 - 0.2 x10E3/uL   Immature Granulocytes 0 Not Estab. %   Immature Grans (Abs) 0.0 0.0 - 0.1 x10E3/uL  Comprehensive metabolic panel  Result Value Ref Range   Glucose 130 (H) 65 - 99 mg/dL   BUN 10 8 - 27 mg/dL   Creatinine, Ser 0.96 0.76 - 1.27 mg/dL   GFR calc non Af Amer 77 >59 mL/min/1.73   GFR calc Af Amer 89 >59 mL/min/1.73   BUN/Creatinine Ratio 10 10 - 24   Sodium 143 134 - 144 mmol/L   Potassium 4.1 3.5 - 5.2 mmol/L   Chloride 103 96 - 106 mmol/L   CO2 23 18 - 29 mmol/L   Calcium 9.9 8.6 - 10.2 mg/dL   Total Protein 7.0 6.0 - 8.5 g/dL   Albumin 4.6 3.5 - 4.8 g/dL   Globulin, Total 2.4 1.5 - 4.5 g/dL   Albumin/Globulin Ratio 1.9 1.2 - 2.2   Bilirubin Total 0.7 0.0 - 1.2 mg/dL   Alkaline Phosphatase 50 39 - 117 IU/L   AST 23 0 - 40 IU/L   ALT 22 0 - 44 IU/L  Bayer DCA Hb A1c Waived  Result Value Ref Range   Bayer DCA Hb A1c Waived 6.1 <7.0 %  Lipid Panel w/o Chol/HDL Ratio  Result Value Ref Range   Cholesterol, Total 171 100 - 199 mg/dL   Triglycerides 186 (H) 0 - 149 mg/dL   HDL 43 >39 mg/dL   VLDL Cholesterol Cal 37 5 - 40 mg/dL   LDL Calculated 91 0 - 99 mg/dL  Microalbumin, Urine Waived  Result Value Ref Range   Microalb, Ur Waived 10 0 - 19 mg/L   Creatinine, Urine Waived 50 10 - 300 mg/dL   Microalb/Creat Ratio 30-300 (H) <30 mg/g  TSH  Result Value Ref Range   TSH 4.140 0.450 - 4.500 uIU/mL  UA/M w/rflx Culture, Routine  Result Value Ref Range   Specific Gravity, UA 1.010 1.005 - 1.030   pH, UA 5.0 5.0 - 7.5   Color, UA Yellow Yellow   Appearance Ur Clear Clear   Leukocytes, UA Negative Negative   Protein, UA Negative Negative/Trace   Glucose, UA Negative Negative   Ketones, UA Negative Negative   RBC, UA Negative Negative   Bilirubin, UA Negative Negative   Urobilinogen, Ur 0.2 0.2 - 1.0 mg/dL   Nitrite, UA Negative Negative   Microscopic Examination See below:       Assessment & Plan:   Problem List Items Addressed This Visit       Endocrine   Diabetic peripheral neuropathy associated with type 2 diabetes mellitus ([place]) - Primary    Will increase his gabapentin to 600mg  BID. Call with any concerns. Will continue current regimen. Will have social worker call to  see about help in paying for his medicine.       Relevant Medications   gabapentin (NEURONTIN) 300 MG capsule    Other Visit Diagnoses    Edema, unspecified type       Due to previous fracture. Keep it elevated. Call with any concerns.        Follow up plan: Return As scheduled.

## 2017-01-03 ENCOUNTER — Other Ambulatory Visit: Payer: Self-pay | Admitting: Family Medicine

## 2017-01-18 ENCOUNTER — Telehealth: Payer: Self-pay | Admitting: Family Medicine

## 2017-01-18 NOTE — Telephone Encounter (Signed)
Patient called to see if Tiffany could call him back regarding the trulicity he has started.  He has been waiting to hear something regarding financial assistance on this medication.    [phone]  Thanks

## 2017-01-19 NOTE — Telephone Encounter (Signed)
Is there anyway that you can contact them and see what is going on?

## 2017-01-19 NOTE — Telephone Encounter (Signed)
Keri: Can you please check on this for me

## 2017-01-19 NOTE — Telephone Encounter (Signed)
This is what is documented in the referral.  "Good morning Dawn,  Please see message below. A referral was received from [facility]. Patient is on APL.  Patient will be marked "Pending' until further notice.  Thank you, Alycia Rossetti  Help with paying for lantus and trulicity. Patient requests a letter sent to him ahead of time letting him know who is calling, when and their phone number. Thank you!"  Then there is an encounter for documentation from [facility], but I don't see anything in the encounter.

## 2017-01-23 ENCOUNTER — Encounter: Payer: Self-pay | Admitting: Pharmacist

## 2017-01-23 NOTE — Telephone Encounter (Signed)
Finally got in contact with [facility] Alycia Rossetti, who last documented in referral. She sent another message to [facility] the [place] regarding the status of this referral. Helene Kelp noted that there was no documentation where the pharmacy tried to contact him. Will wait to see if pharmacy contacts patient in timely manner. If not, will call again.   FYI Dr. Wynetta Emery and Jonelle Sidle. Please route back to me so it will remain in my box. Thank you.

## 2017-01-23 NOTE — Telephone Encounter (Signed)
Noted, please keep me informed

## 2017-01-24 ENCOUNTER — Telehealth: Payer: Self-pay | Admitting: Family Medicine

## 2017-01-24 NOTE — Telephone Encounter (Signed)
[facility] has sent patient a letter regarding and he is eligible. You can see the letter in chart review.  They stated they will be calling him 01/31/2017 from Ralene Bathe, PharmD.

## 2017-01-24 NOTE — Telephone Encounter (Signed)
Patient notified of phone call he will be receiving.

## 2017-01-24 NOTE — Telephone Encounter (Signed)
error 

## 2017-01-24 NOTE — Telephone Encounter (Signed)
Patient would like for CMA to explain to him who person is who will be calling him and where she will be calling from and what time she will be calling. Patient states that he does not want to miss the call and would like to speak wit CMA.  Please Advise.  Thank you

## 2017-01-31 ENCOUNTER — Telehealth: Payer: Self-pay | Admitting: Family Medicine

## 2017-01-31 ENCOUNTER — Ambulatory Visit: Payer: Self-pay | Admitting: Pharmacist

## 2017-01-31 ENCOUNTER — Other Ambulatory Visit: Payer: Self-pay | Admitting: Pharmacist

## 2017-01-31 NOTE — Telephone Encounter (Signed)
I believe pt is requesting glipizide. Please advise.

## 2017-01-31 NOTE — Telephone Encounter (Signed)
Medication assistance for patient's trulicity and lantis.   Please Advise.  Thank you.

## 2017-01-31 NOTE — Telephone Encounter (Signed)
Patient would like to find a medicine he can afford due to not being able to buy the Trulicity. Patient would like to get lifiside, a diabetic medicine. Informed patient he may be contacted regarding the type of medication he is asking for.    Patient would like to be notified if and once medication is refilled.   Please Advise.  Thank you

## 2017-02-01 NOTE — Telephone Encounter (Signed)
Spoke with [place], she states that the patient does not qualify for help through [place], he has to spend at least 1100.00 out of pocket before he can apply for assistance. Patient would like to restart previous medication.  Called patient to schedule  an appointment to come in and speak about changing his diabetic medicine, he states that he does not want to come in right now he would rather restart the Glipizide and come back in 3 months and have his A1C rechecked again to see if it is controlled.   Dr.Johnson, is this something that can be done?

## 2017-02-01 NOTE — Telephone Encounter (Signed)
Called and left a message for Rick [name] to return my call.

## 2017-02-01 NOTE — Telephone Encounter (Signed)
Called and spoke to [place]- will hold on changing things over the phone. Will get him 1 month of samples (he'll come pick up this morning) and he's due for follow up on his sugar at the end of July. We will change medicine at that time.

## 2017-02-01 NOTE — Patient Outreach (Signed)
[facility] ([facility] Care Management  02/01/2017  Rick [name] 01/11/1941 [id number]  75 year old male with PMHx including but not limited to type 2 diabetes with neuropathy, HTN, HLD, bipolar disorder, BPH, chronic prostatitis, ulcerative colitis, and history of alcoholism and tobacco abuse.  Patient referred to [facility] pharmacy by provider office for medication assistance with Trulicity and Lantus.    Successful telephone encounter with patient 01/31/17.  Patient confirms he has [employer] ([employer]) insurance.  He is unsure of what his total monthly income is.  He states he believes he is receiving partial Low-Income Subsidy (LIS).  He is more concerned about paying for Trulicity than Lantus as Trulicity is more expensive.  He states he has been receiving Trulicity samples from his provider.  Patient does not wish to complete a medication reconciliation or discuss any of his other medications.    Through [employer], Trulicity and Lantus are both Tier 3 medications which require a one-time $170 deductible with $45 copay after deductible is met.  Partial LIS has a copay up to out-of-pocket threshold of 15%.    Spoke with patient's pharmacy, [facility], after patient gave his verbal consent with pharmacy.  For Trulicity, patient paid deductible in January and then $45 co-pays.  In April, he paid $117.56 for a 1 month supply therefore patient now likely in the coverage gap.  His total out of pocket spend for 2018 is $632.    [name] patient assistance program (PAP) for Trulicity:  Income requirement <$36,420 for household of 1  Out of pocket spend requirement of $1100   Patient meets the income requirement but not the out-of-pocket requirement for [name] PAP.  Discussed PAP with patient and he does not wish to continue on Trulicity until he meets the out-of-pocket spend which would be another $470.  He states it is too expensive for him and he cannot afford it even though  he knows that it is a "good drug."  He does think he will continue to pay for the Lantus.  He requests to go back to a previous diabetes drug which appears to be glimepiride per review of [facility].    Spoke with representative from provider office regarding discussion of medication assistance with patient.  Plan: Route note to provider  [facility] pharmacy will sign-off for now but am happy to assist in the future as needed.    Colleen Summe, PharmD, BCPS Clinical Pharmacist [facility] [phone]       

## 2017-02-06 ENCOUNTER — Other Ambulatory Visit: Payer: Self-pay | Admitting: Family Medicine

## 2017-03-09 ENCOUNTER — Encounter: Payer: Self-pay | Admitting: Family Medicine

## 2017-03-09 ENCOUNTER — Ambulatory Visit (INDEPENDENT_AMBULATORY_CARE_PROVIDER_SITE_OTHER): Payer: Medicare Other | Admitting: Family Medicine

## 2017-03-09 VITALS — BP 118/78 | HR 85 | Temp 97.8°F | Wt 170.6 lb

## 2017-03-09 DIAGNOSIS — E1142 Type 2 diabetes mellitus with diabetic polyneuropathy: Secondary | ICD-10-CM | POA: Diagnosis not present

## 2017-03-09 DIAGNOSIS — H6122 Impacted cerumen, left ear: Secondary | ICD-10-CM | POA: Diagnosis not present

## 2017-03-09 NOTE — Assessment & Plan Note (Signed)
Under good control with A1c of 5.9. Will stop trulicity due to cost and recheck A1c in 3 months. Call with any concerns. If over 6.5 next time, will consider low dose glipizide.

## 2017-03-09 NOTE — Progress Notes (Signed)
BP 118/78 (BP Location: Left Arm, Patient Position: Sitting, Cuff Size: Normal)   Pulse 85   Temp 97.8 F (36.6 C)   Wt 170 lb 9 oz (77.4 kg)   SpO2 95%   BMI 25.93 kg/m    Subjective:    Patient ID: Rick [name], male    DOB: 04-Mar-1941, 76 y.o.   MRN: [id number]  HPI: Rick [name] is a 76 y.o. male  Chief Complaint  Patient presents with  . Diabetes  . Cerumen Impaction    Patient states that he needs his right ear flushed every 3 months   DIABETES Hypoglycemic episodes:no Polydipsia/polyuria: no Visual disturbance: no Chest pain: no Paresthesias: no Glucose Monitoring: yes  Accucheck frequency: Daily Taking Insulin?: yes Blood Pressure Monitoring: not checking Retinal Examination: Up to Date Foot Exam: Up to Date Diabetic Education: Completed Pneumovax: Up to Date Influenza: Up to Date Aspirin: yes  EAG CLOGGED Duration: days Involved ear(s):  left Sensation of feeling clogged/plugged: yes Decreased/muffled hearing:yes Ear pain: no Fever: no Otorrhea: no Hearing loss: yes Upper respiratory infection symptoms: no Using Q-Tips: yes Status: worse History of cerumenosis: yes Treatments attempted: Debrox   Relevant past medical, surgical, family and social history reviewed and updated as indicated. Interim medical history since our last visit reviewed. Allergies and medications reviewed and updated.  Review of Systems  Constitutional: Negative.   HENT: Positive for ear discharge and hearing loss. Negative for congestion, dental problem, drooling, ear pain, facial swelling, mouth sores, nosebleeds, postnasal drip, rhinorrhea, sinus pain, sinus pressure, sneezing, sore throat, tinnitus, trouble swallowing and voice change.   Respiratory: Negative.   Cardiovascular: Negative.   Psychiatric/Behavioral: Negative.     Per HPI unless specifically indicated above     Objective:    BP 118/78 (BP Location: Left Arm, Patient Position: Sitting, Cuff  Size: Normal)   Pulse 85   Temp 97.8 F (36.6 C)   Wt 170 lb 9 oz (77.4 kg)   SpO2 95%   BMI 25.93 kg/m   Wt Readings from Last 3 Encounters:  03/09/17 170 lb 9 oz (77.4 kg)  12/26/16 172 lb 3.2 oz (78.1 kg)  12/07/16 172 lb 1.6 oz (78.1 kg)    Physical Exam  Constitutional: He is oriented to person, place, and time. He appears well-developed and well-nourished. No distress.  HENT:  Head: Normocephalic and atraumatic.  Right Ear: Hearing and external ear normal.  Left Ear: Hearing normal.  Nose: Nose normal.  Mouth/Throat: Oropharynx is clear and moist. No oropharyngeal exudate.  L EAC occluded with cerumen  Eyes: Conjunctivae and lids are normal. Right eye exhibits no discharge. Left eye exhibits no discharge. No scleral icterus.  Cardiovascular: Normal rate, regular rhythm, normal heart sounds and intact distal pulses.  Exam reveals no gallop and no friction rub.   No murmur heard. Pulmonary/Chest: Effort normal and breath sounds normal. No respiratory distress. He has no wheezes. He has no rales. He exhibits no tenderness.  Musculoskeletal: Normal range of motion.  Neurological: He is alert and oriented to person, place, and time.  Skin: Skin is warm, dry and intact. No rash noted. He is not diaphoretic. No erythema. No pallor.  Psychiatric: He has a normal mood and affect. His speech is normal and behavior is normal. Judgment and thought content normal. Cognition and memory are normal.  Nursing note and vitals reviewed.   Results for orders placed or performed in visit on 12/07/16  Microscopic Examination  Result Value Ref  Range   WBC, UA 0-5 0 - 5 /hpf   RBC, UA None seen 0 - 2 /hpf   Epithelial Cells (non renal) 0-10 0 - 10 /hpf   Bacteria, UA None seen None seen/Few  CBC with Differential/Platelet  Result Value Ref Range   WBC 8.6 3.4 - 10.8 x10E3/uL   RBC 5.34 4.14 - 5.80 x10E6/uL   Hemoglobin 15.5 13.0 - 17.7 g/dL   Hematocrit 45.6 37.5 - 51.0 %   MCV 85 79 - 97  fL   MCH 29.0 26.6 - 33.0 pg   MCHC 34.0 31.5 - 35.7 g/dL   RDW 14.8 12.3 - 15.4 %   Platelets 297 150 - 379 x10E3/uL   Neutrophils 55 Not Estab. %   Lymphs 32 Not Estab. %   Monocytes 7 Not Estab. %   Eos 5 Not Estab. %   Basos 1 Not Estab. %   Neutrophils Absolute 4.7 1.4 - 7.0 x10E3/uL   Lymphocytes Absolute 2.8 0.7 - 3.1 x10E3/uL   Monocytes Absolute 0.6 0.1 - 0.9 x10E3/uL   EOS (ABSOLUTE) 0.4 0.0 - 0.4 x10E3/uL   Basophils Absolute 0.1 0.0 - 0.2 x10E3/uL   Immature Granulocytes 0 Not Estab. %   Immature Grans (Abs) 0.0 0.0 - 0.1 x10E3/uL  Comprehensive metabolic panel  Result Value Ref Range   Glucose 130 (H) 65 - 99 mg/dL   BUN 10 8 - 27 mg/dL   Creatinine, Ser 0.96 0.76 - 1.27 mg/dL   GFR calc non Af Amer 77 >59 mL/min/1.73   GFR calc Af Amer 89 >59 mL/min/1.73   BUN/Creatinine Ratio 10 10 - 24   Sodium 143 134 - 144 mmol/L   Potassium 4.1 3.5 - 5.2 mmol/L   Chloride 103 96 - 106 mmol/L   CO2 23 18 - 29 mmol/L   Calcium 9.9 8.6 - 10.2 mg/dL   Total Protein 7.0 6.0 - 8.5 g/dL   Albumin 4.6 3.5 - 4.8 g/dL   Globulin, Total 2.4 1.5 - 4.5 g/dL   Albumin/Globulin Ratio 1.9 1.2 - 2.2   Bilirubin Total 0.7 0.0 - 1.2 mg/dL   Alkaline Phosphatase 50 39 - 117 IU/L   AST 23 0 - 40 IU/L   ALT 22 0 - 44 IU/L  Bayer DCA Hb A1c Waived  Result Value Ref Range   Bayer DCA Hb A1c Waived 6.1 <7.0 %  Lipid Panel w/o Chol/HDL Ratio  Result Value Ref Range   Cholesterol, Total 171 100 - 199 mg/dL   Triglycerides 186 (H) 0 - 149 mg/dL   HDL 43 >39 mg/dL   VLDL Cholesterol Cal 37 5 - 40 mg/dL   LDL Calculated 91 0 - 99 mg/dL  Microalbumin, Urine Waived  Result Value Ref Range   Microalb, Ur Waived 10 0 - 19 mg/L   Creatinine, Urine Waived 50 10 - 300 mg/dL   Microalb/Creat Ratio 30-300 (H) <30 mg/g  TSH  Result Value Ref Range   TSH 4.140 0.450 - 4.500 uIU/mL  UA/M w/rflx Culture, Routine  Result Value Ref Range   Specific Gravity, UA 1.010 1.005 - 1.030   pH, UA 5.0 5.0 -  7.5   Color, UA Yellow Yellow   Appearance Ur Clear Clear   Leukocytes, UA Negative Negative   Protein, UA Negative Negative/Trace   Glucose, UA Negative Negative   Ketones, UA Negative Negative   RBC, UA Negative Negative   Bilirubin, UA Negative Negative   Urobilinogen, Ur 0.2 0.2 - 1.0  mg/dL   Nitrite, UA Negative Negative   Microscopic Examination See below:       Assessment & Plan:   Problem List Items Addressed This Visit      Endocrine   Diabetic peripheral neuropathy associated with type 2 diabetes mellitus ([place]) - Primary    Under good control with A1c of 5.9. Will stop trulicity due to cost and recheck A1c in 3 months. Call with any concerns. If over 6.5 next time, will consider low dose glipizide.       Relevant Orders   Bayer DCA Hb A1c Waived    Other Visit Diagnoses    Impacted cerumen of left ear       Very hard. Will have him use debrox for 3 days then return to have it flushed.        Follow up plan: Return Tueday for ear flushing, 3 months for Bp/Chol/DM visit.

## 2017-03-13 ENCOUNTER — Ambulatory Visit (INDEPENDENT_AMBULATORY_CARE_PROVIDER_SITE_OTHER): Payer: Medicare Other | Admitting: Family Medicine

## 2017-03-13 ENCOUNTER — Encounter: Payer: Self-pay | Admitting: Family Medicine

## 2017-03-13 VITALS — BP 119/72 | HR 79 | Temp 97.9°F | Wt 171.0 lb

## 2017-03-13 DIAGNOSIS — H6122 Impacted cerumen, left ear: Secondary | ICD-10-CM

## 2017-03-13 LAB — BAYER DCA HB A1C WAIVED: HB A1C (BAYER DCA - WAIVED): 5.9 % (ref <7.0)

## 2017-03-13 NOTE — Progress Notes (Signed)
BP 119/72 (BP Location: Left Arm, Patient Position: Sitting, Cuff Size: Normal)   Pulse 79   Temp 97.9 F (36.6 C)   Wt 171 lb (77.6 kg)   SpO2 96%   BMI 26.00 kg/m    Subjective:    Patient ID: Rick [name], male    DOB: [birth year], 76 y.o.   MRN: [id number]  HPI: Rick [name] is a 76 y.o. male  Chief Complaint  Patient presents with  . Cerumen Impaction   EAG CLOGGED Duration: weeks Involved ear(s):  left Sensation of feeling clogged/plugged: yes Decreased/muffled hearing:yes Ear pain: no Fever: no Otorrhea: no Hearing loss: yes Upper respiratory infection symptoms: no Using Q-Tips: yes Status: better History of cerumenosis: yes Treatments attempted: debrox x 3 days   Relevant past medical, surgical, family and social history reviewed and updated as indicated. Interim medical history since our last visit reviewed. Allergies and medications reviewed and updated.  Review of Systems  Constitutional: Negative.   HENT: Negative for congestion, dental problem, drooling, ear discharge, ear pain, facial swelling, hearing loss, mouth sores, nosebleeds, postnasal drip, rhinorrhea, sinus pain, sinus pressure, sneezing, sore throat, tinnitus, trouble swallowing and voice change.   Respiratory: Negative.   Cardiovascular: Negative.   Psychiatric/Behavioral: Negative.     Per HPI unless specifically indicated above     Objective:    BP 119/72 (BP Location: Left Arm, Patient Position: Sitting, Cuff Size: Normal)   Pulse 79   Temp 97.9 F (36.6 C)   Wt 171 lb (77.6 kg)   SpO2 96%   BMI 26.00 kg/m   Wt Readings from Last 3 Encounters:  03/13/17 171 lb (77.6 kg)  03/09/17 170 lb 9 oz (77.4 kg)  12/26/16 172 lb 3.2 oz (78.1 kg)    Physical Exam  Constitutional: He is oriented to person, place, and time. He appears well-developed and well-nourished. No distress.  HENT:  Head: Normocephalic and atraumatic.  Right Ear: Hearing and external ear normal.  Left  Ear: Hearing normal.  Nose: Nose normal.  Mouth/Throat: Oropharynx is clear and moist. No oropharyngeal exudate.  Cerumen impaction L ear- softer than previously  Eyes: Conjunctivae and lids are normal. Right eye exhibits no discharge. Left eye exhibits no discharge. No scleral icterus.  Cardiovascular: Normal rate, regular rhythm, normal heart sounds and intact distal pulses.  Exam reveals no gallop and no friction rub.   No murmur heard. Pulmonary/Chest: Effort normal and breath sounds normal. No respiratory distress. He has no wheezes. He has no rales. He exhibits no tenderness.  Musculoskeletal: Normal range of motion.  Neurological: He is alert and oriented to person, place, and time.  Skin: Skin is intact. No rash noted.  Psychiatric: He has a normal mood and affect. His speech is normal and behavior is normal. Judgment and thought content normal. Cognition and memory are normal.  Nursing note and vitals reviewed.   Results for orders placed or performed in visit on 12/07/16  Microscopic Examination  Result Value Ref Range   WBC, UA 0-5 0 - 5 /hpf   RBC, UA None seen 0 - 2 /hpf   Epithelial Cells (non renal) 0-10 0 - 10 /hpf   Bacteria, UA None seen None seen/Few  CBC with Differential/Platelet  Result Value Ref Range   WBC 8.6 3.4 - 10.8 x10E3/uL   RBC 5.34 4.14 - 5.80 x10E6/uL   Hemoglobin 15.5 13.0 - 17.7 g/dL   Hematocrit 45.6 37.5 - 51.0 %   MCV 85 79 -  97 fL   MCH 29.0 26.6 - 33.0 pg   MCHC 34.0 31.5 - 35.7 g/dL   RDW 14.8 12.3 - 15.4 %   Platelets 297 150 - 379 x10E3/uL   Neutrophils 55 Not Estab. %   Lymphs 32 Not Estab. %   Monocytes 7 Not Estab. %   Eos 5 Not Estab. %   Basos 1 Not Estab. %   Neutrophils Absolute 4.7 1.4 - 7.0 x10E3/uL   Lymphocytes Absolute 2.8 0.7 - 3.1 x10E3/uL   Monocytes Absolute 0.6 0.1 - 0.9 x10E3/uL   EOS (ABSOLUTE) 0.4 0.0 - 0.4 x10E3/uL   Basophils Absolute 0.1 0.0 - 0.2 x10E3/uL   Immature Granulocytes 0 Not Estab. %   Immature  Grans (Abs) 0.0 0.0 - 0.1 x10E3/uL  Comprehensive metabolic panel  Result Value Ref Range   Glucose 130 (H) 65 - 99 mg/dL   BUN 10 8 - 27 mg/dL   Creatinine, Ser 0.96 0.76 - 1.27 mg/dL   GFR calc non Af Amer 77 >59 mL/min/1.73   GFR calc Af Amer 89 >59 mL/min/1.73   BUN/Creatinine Ratio 10 10 - 24   Sodium 143 134 - 144 mmol/L   Potassium 4.1 3.5 - 5.2 mmol/L   Chloride 103 96 - 106 mmol/L   CO2 23 18 - 29 mmol/L   Calcium 9.9 8.6 - 10.2 mg/dL   Total Protein 7.0 6.0 - 8.5 g/dL   Albumin 4.6 3.5 - 4.8 g/dL   Globulin, Total 2.4 1.5 - 4.5 g/dL   Albumin/Globulin Ratio 1.9 1.2 - 2.2   Bilirubin Total 0.7 0.0 - 1.2 mg/dL   Alkaline Phosphatase 50 39 - 117 IU/L   AST 23 0 - 40 IU/L   ALT 22 0 - 44 IU/L  Bayer DCA Hb A1c Waived  Result Value Ref Range   Bayer DCA Hb A1c Waived 6.1 <7.0 %  Lipid Panel w/o Chol/HDL Ratio  Result Value Ref Range   Cholesterol, Total 171 100 - 199 mg/dL   Triglycerides 186 (H) 0 - 149 mg/dL   HDL 43 >39 mg/dL   VLDL Cholesterol Cal 37 5 - 40 mg/dL   LDL Calculated 91 0 - 99 mg/dL  Microalbumin, Urine Waived  Result Value Ref Range   Microalb, Ur Waived 10 0 - 19 mg/L   Creatinine, Urine Waived 50 10 - 300 mg/dL   Microalb/Creat Ratio 30-300 (H) <30 mg/g  TSH  Result Value Ref Range   TSH 4.140 0.450 - 4.500 uIU/mL  UA/M w/rflx Culture, Routine  Result Value Ref Range   Specific Gravity, UA 1.010 1.005 - 1.030   pH, UA 5.0 5.0 - 7.5   Color, UA Yellow Yellow   Appearance Ur Clear Clear   Leukocytes, UA Negative Negative   Protein, UA Negative Negative/Trace   Glucose, UA Negative Negative   Ketones, UA Negative Negative   RBC, UA Negative Negative   Bilirubin, UA Negative Negative   Urobilinogen, Ur 0.2 0.2 - 1.0 mg/dL   Nitrite, UA Negative Negative   Microscopic Examination See below:       Assessment & Plan:   Problem List Items Addressed This Visit    None    Visit Diagnoses    Impacted cerumen of left ear    -  Primary    Flushed today with good results. Continue debrox. Call with any concerns.        Follow up plan: Return in about 3 months (around 06/13/2017) for DM visit.

## 2017-03-28 ENCOUNTER — Ambulatory Visit: Payer: Medicare Other | Admitting: Family Medicine

## 2017-04-09 ENCOUNTER — Telehealth: Payer: Self-pay | Admitting: Family Medicine

## 2017-04-09 NOTE — Telephone Encounter (Signed)
Patient would like to speak to you regarding his blood sugars spiking.  It has been running up to 350s and up.  [place]

## 2017-04-09 NOTE — Telephone Encounter (Signed)
Pt was taken of the trulicity, and was expecting to be put on glipizide. Pt could not tell me why he was taken off of the Trulicity.  Pt is taking Lantus 15 units in the morning as soon as he gets up, he is also taking Metformin 1000 BID.   Pt states his sugars are running in the 170-300 range and spiking upwards of 333. Pt states he is not a young man and cannot work this high sugar levels off with exercise.   Pt states he is drinking lots of grapefruit juice, and eating bread.   Pt got very loud and rude, when I suggested that dietary changes could help control sugar levels. After multiple attempts to calm pt without success, I  advised pt that I was going to disconnect the call, but would forward message to provider about sugar levels.       Lab Results  Component Value Date   HGBA1C 6.3 01/05/2016

## 2017-04-10 ENCOUNTER — Encounter: Payer: Self-pay | Admitting: Family Medicine

## 2017-04-10 MED ORDER — GLIPIZIDE ER 2.5 MG PO TB24: 2.5000 mg | ORAL_TABLET | Freq: Every day | ORAL | 1 refills | Status: DC

## 2017-04-10 NOTE — Addendum Note (Signed)
Addended by: [name] on: 04/10/2017 04:16 PM   Modules accepted: Orders

## 2017-04-10 NOTE — Telephone Encounter (Signed)
Please let him know that I've sent a low dose of glipizide to his pharmacy and we can see how his blood sugars are doing next time he comes in.

## 2017-04-10 NOTE — Telephone Encounter (Signed)
Patient notified, he will call next week with his fasting blood sugars.

## 2017-04-20 ENCOUNTER — Ambulatory Visit (INDEPENDENT_AMBULATORY_CARE_PROVIDER_SITE_OTHER): Payer: Medicare Other | Admitting: Family Medicine

## 2017-04-20 ENCOUNTER — Encounter: Payer: Self-pay | Admitting: Family Medicine

## 2017-04-20 VITALS — BP 129/84 | HR 85 | Temp 97.6°F | Wt 170.1 lb

## 2017-04-20 DIAGNOSIS — H6501 Acute serous otitis media, right ear: Secondary | ICD-10-CM

## 2017-04-20 MED ORDER — AMOXICILLIN 875 MG PO TABS: 875.0000 mg | ORAL_TABLET | Freq: Two times a day (BID) | ORAL | 0 refills | Status: DC

## 2017-04-20 NOTE — Progress Notes (Signed)
BP 129/84 (BP Location: Left Arm, Patient Position: Sitting, Cuff Size: Normal)   Pulse 85   Temp 97.6 F (36.4 C)   Wt 170 lb 2 oz (77.2 kg)   SpO2 95%   BMI 25.87 kg/m    Subjective:    Patient ID: Rick [name], male    DOB: 12-21-1940, 76 y.o.   MRN: [id number]  HPI: Rick [name] is a 76 y.o. male  Chief Complaint  Patient presents with  . Ear Pain    right   EAR PAIN Duration: 2 weeks Involved ear(s): right Severity:  moderate  Quality:  Aching and sore Fever: no Otorrhea: no Upper respiratory infection symptoms: no Pruritus: no Hearing loss: yes Water immersion no Using Q-tips: no Recurrent otitis media: no Status: worse Treatments attempted: none  Relevant past medical, surgical, family and social history reviewed and updated as indicated. Interim medical history since our last visit reviewed. Allergies and medications reviewed and updated.  Review of Systems  Constitutional: Negative.   HENT: Positive for ear pain and hearing loss. Negative for congestion, dental problem, drooling, ear discharge, facial swelling, mouth sores, nosebleeds, postnasal drip, rhinorrhea, sinus pain, sinus pressure, sneezing, sore throat, tinnitus, trouble swallowing and voice change.   Respiratory: Negative.   Cardiovascular: Negative.   Psychiatric/Behavioral: Negative.     Per HPI unless specifically indicated above     Objective:    BP 129/84 (BP Location: Left Arm, Patient Position: Sitting, Cuff Size: Normal)   Pulse 85   Temp 97.6 F (36.4 C)   Wt 170 lb 2 oz (77.2 kg)   SpO2 95%   BMI 25.87 kg/m   Wt Readings from Last 3 Encounters:  04/20/17 170 lb 2 oz (77.2 kg)  03/13/17 171 lb (77.6 kg)  03/09/17 170 lb 9 oz (77.4 kg)    Physical Exam  Constitutional: He is oriented to person, place, and time. He appears well-developed and well-nourished. No distress.  HENT:  Head: Normocephalic and atraumatic.  Right Ear: Hearing, external ear and ear canal  normal. Tympanic membrane is erythematous and bulging.  Left Ear: Hearing, tympanic membrane, external ear and ear canal normal.  Nose: Nose normal.  Mouth/Throat: Oropharynx is clear and moist. No oropharyngeal exudate.  Eyes: Pupils are equal, round, and reactive to light. Conjunctivae, EOM and lids are normal. Right eye exhibits no discharge. Left eye exhibits no discharge. No scleral icterus.  Neck: Normal range of motion. Neck supple. No JVD present. No tracheal deviation present. No thyromegaly present.  Cardiovascular: Normal rate, regular rhythm, normal heart sounds and intact distal pulses.  Exam reveals no gallop and no friction rub.   No murmur heard. Pulmonary/Chest: Effort normal and breath sounds normal. No stridor. No respiratory distress. He has no wheezes. He has no rales. He exhibits no tenderness.  Musculoskeletal: Normal range of motion.  Lymphadenopathy:    He has no cervical adenopathy.  Neurological: He is alert and oriented to person, place, and time.  Skin: Skin is intact. No rash noted. He is not diaphoretic.  Psychiatric: He has a normal mood and affect. His speech is normal and behavior is normal. Judgment and thought content normal. Cognition and memory are normal.  Nursing note and vitals reviewed.   Results for orders placed or performed in visit on 03/09/17  Bayer DCA Hb A1c Waived  Result Value Ref Range   Bayer DCA Hb A1c Waived 5.9 <7.0 %      Assessment & Plan:  Problem List Items Addressed This Visit    None    Visit Diagnoses    Right acute serous otitis media, recurrence not specified    -  Primary   Will treat with amoxicillin. Call with any concerns or if not getting better.    Relevant Medications   amoxicillin (AMOXIL) 875 MG tablet       Follow up plan: Return As scheduled.

## 2017-04-26 ENCOUNTER — Telehealth: Payer: Self-pay

## 2017-04-26 NOTE — Telephone Encounter (Signed)
I believe this was sent to me in error?

## 2017-04-26 NOTE — Telephone Encounter (Signed)
  Please Advise: Patient left message stating his ear was not feeling any better and is running out of his antibiotics and requests a call back to see if he needs more before the storm.

## 2017-04-26 NOTE — Telephone Encounter (Signed)
Spoke with patient, states that his ear still hurts, he has 8 tablets left, let him know to finish his antibiotics and let us know if he isn't better by Tuesday.

## 2017-04-30 ENCOUNTER — Other Ambulatory Visit: Payer: Self-pay | Admitting: Family Medicine

## 2017-04-30 NOTE — Telephone Encounter (Signed)
Your patient 

## 2017-05-07 IMAGING — CR DG ANKLE COMPLETE 3+V*L*
3 series · 3 of 3 positions shown · non-contrast
Comparison: Plain film of the left ankle from earlier same day.

CLINICAL DATA: Post glands

EXAM:
LEFT ANKLE COMPLETE - 3+ VIEW

[ankle ap]
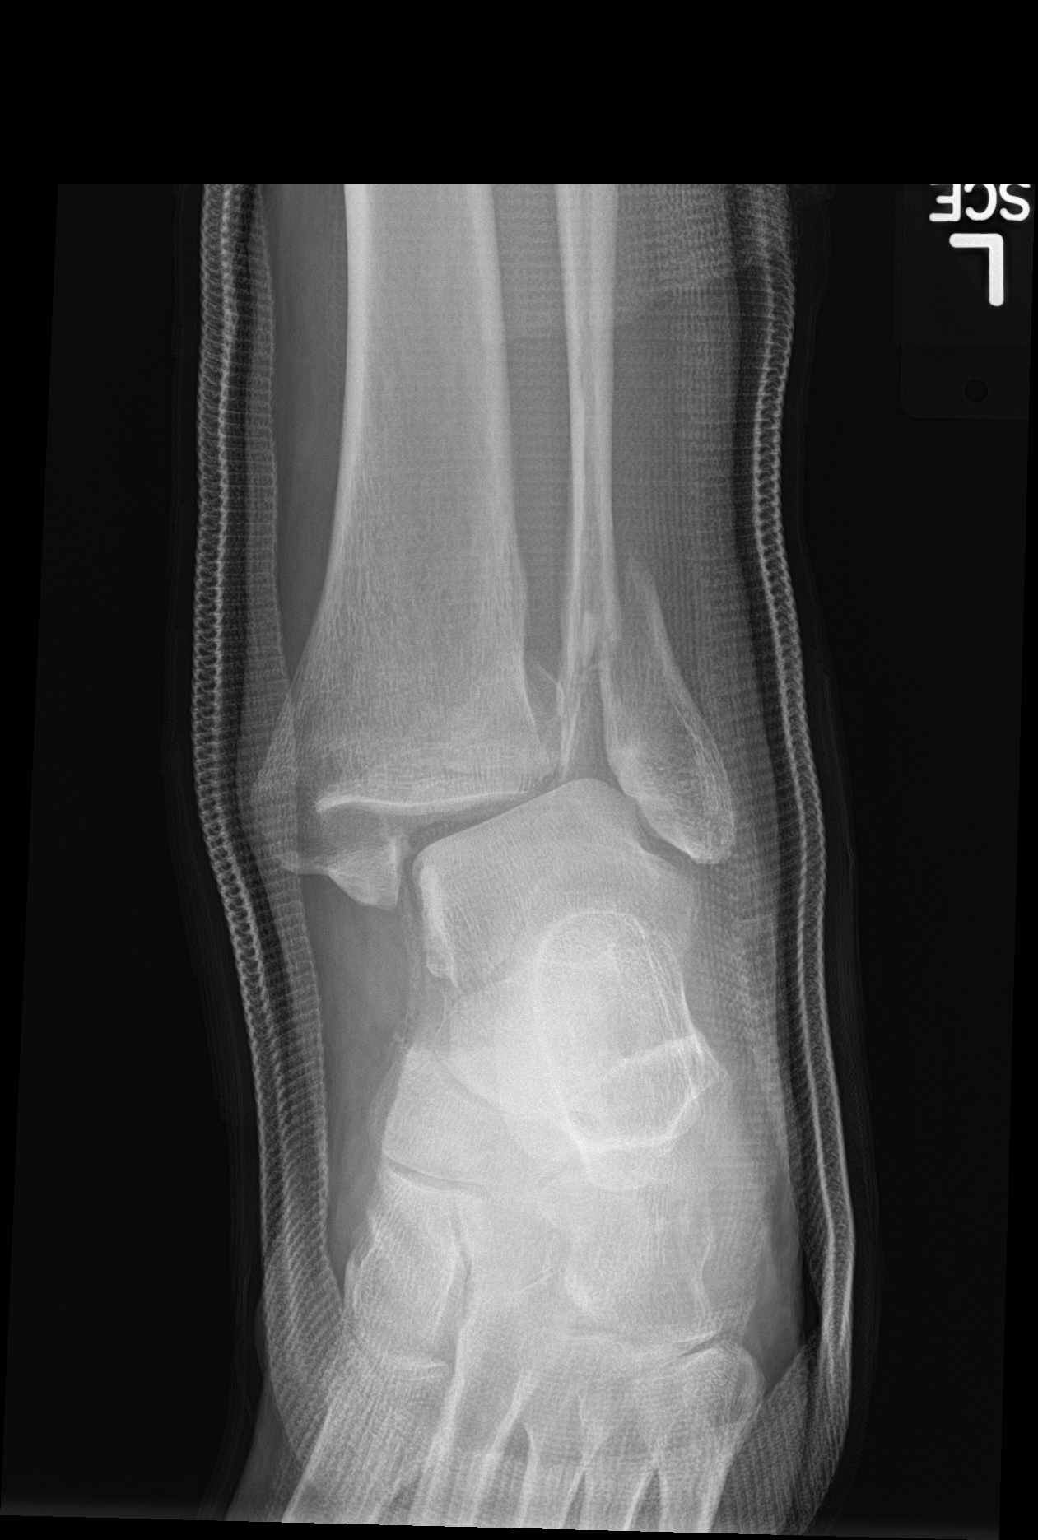

[ankle obl]
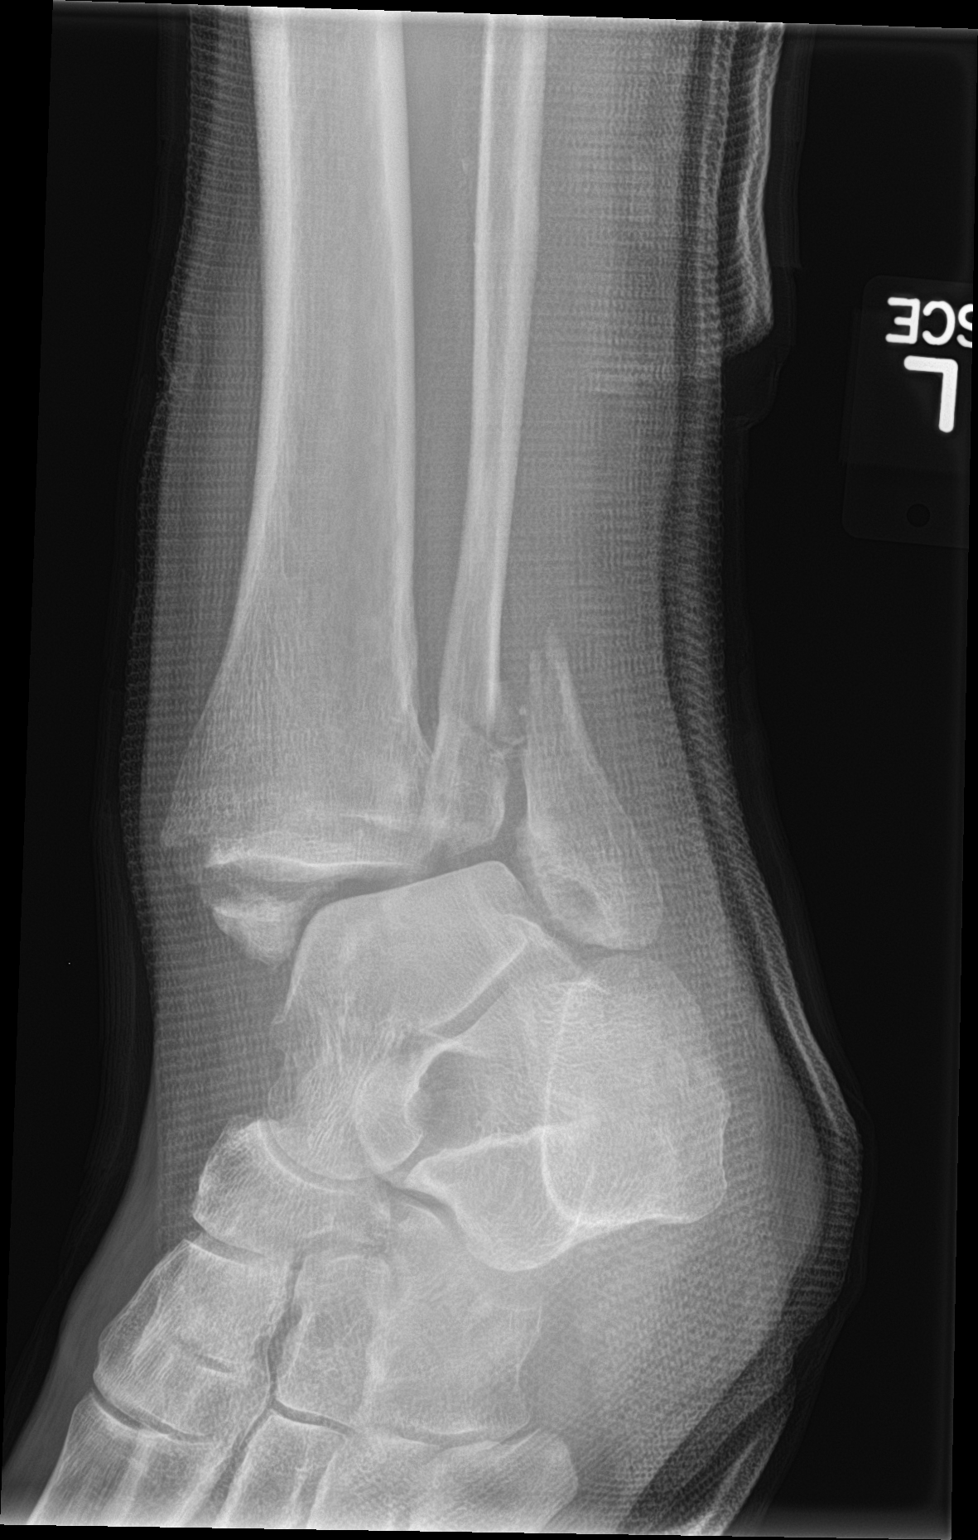

[ankle lat]
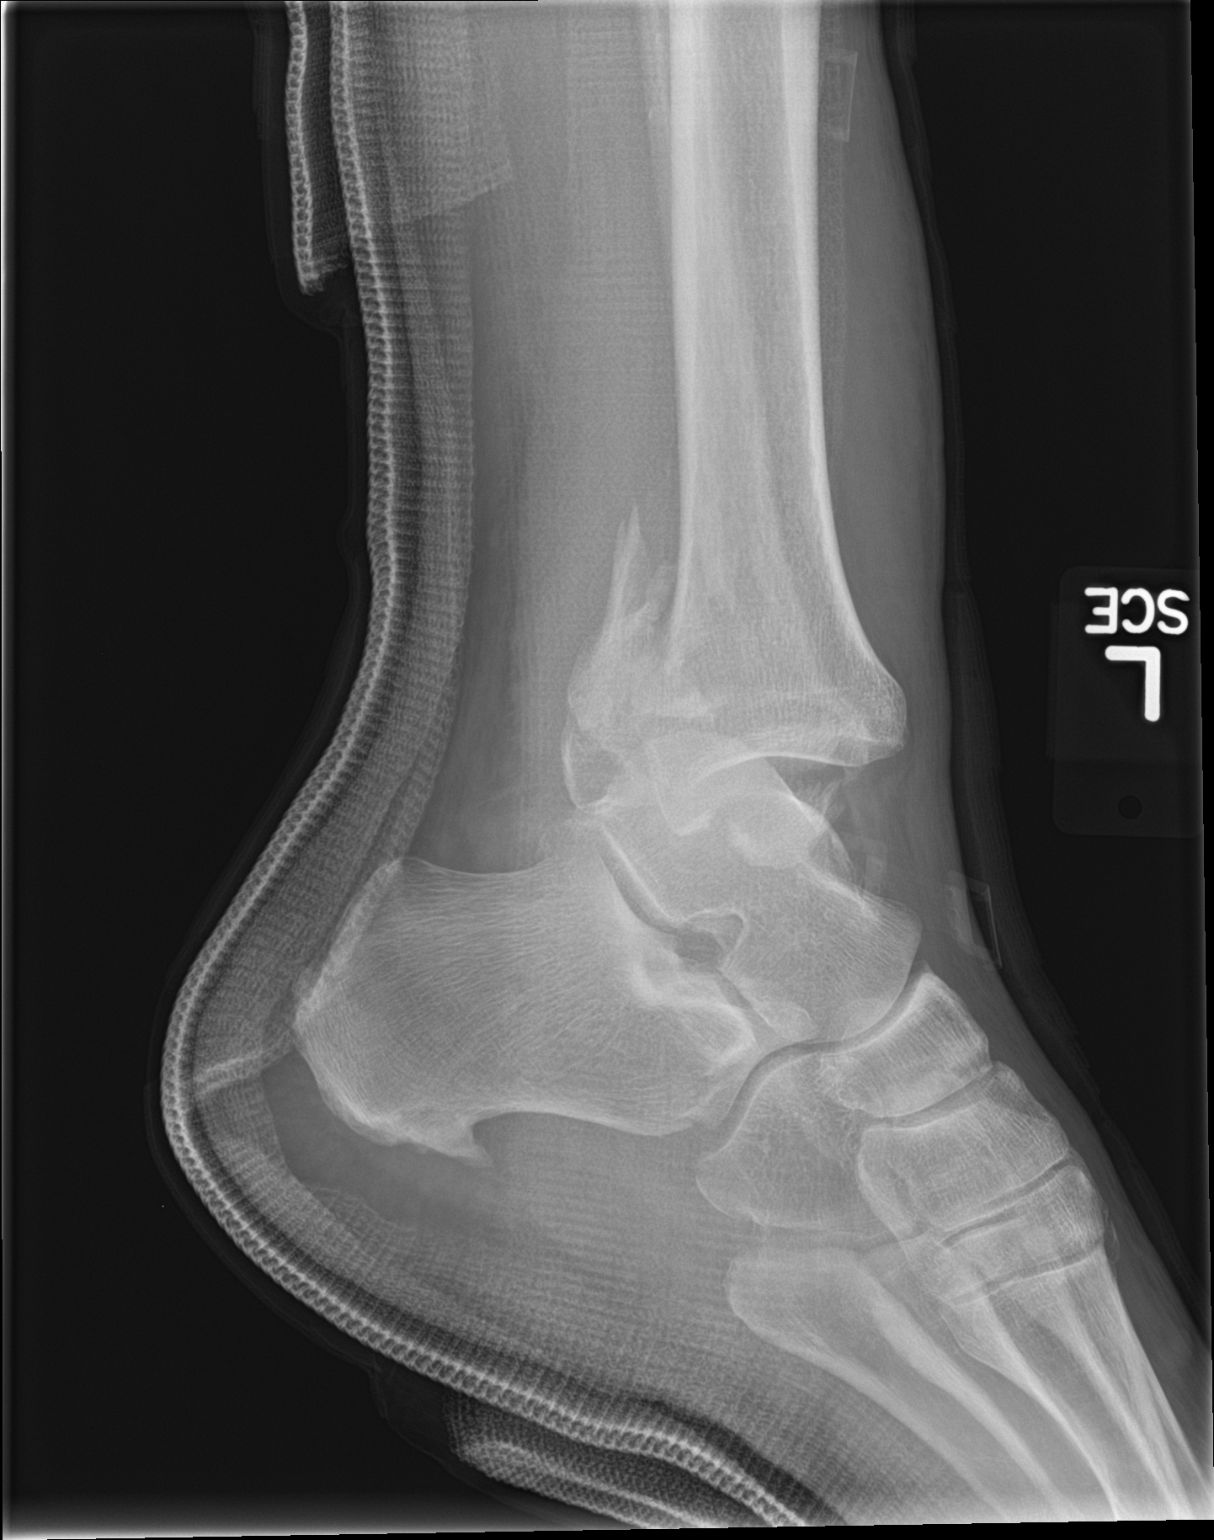

[3 of 3 positions shown; findings below may reference images not displayed]

FINDINGS: Patient is status post splinting. Stable alignment at the fracture
sites. Stable distortion of the ankle mortise.
IMPRESSION: No significant change status post splinting.

## 2017-05-08 ENCOUNTER — Encounter: Payer: Self-pay | Admitting: Family Medicine

## 2017-05-08 ENCOUNTER — Ambulatory Visit (INDEPENDENT_AMBULATORY_CARE_PROVIDER_SITE_OTHER): Payer: Medicare Other | Admitting: Family Medicine

## 2017-05-08 VITALS — BP 121/81 | HR 62 | Temp 98.6°F | Wt 167.0 lb

## 2017-05-08 DIAGNOSIS — H6521 Chronic serous otitis media, right ear: Secondary | ICD-10-CM | POA: Diagnosis not present

## 2017-05-08 IMAGING — CR DG C-ARM 61-120 MIN
3 series · 3 of 3 positions shown · non-contrast
Comparison: 09/26/2015

CLINICAL DATA: Left ankle bimalleolar fracture ORIF

EXAM:
LEFT ANKLE - 2 VIEW; DG C-ARM 61-120 MIN

[p3]
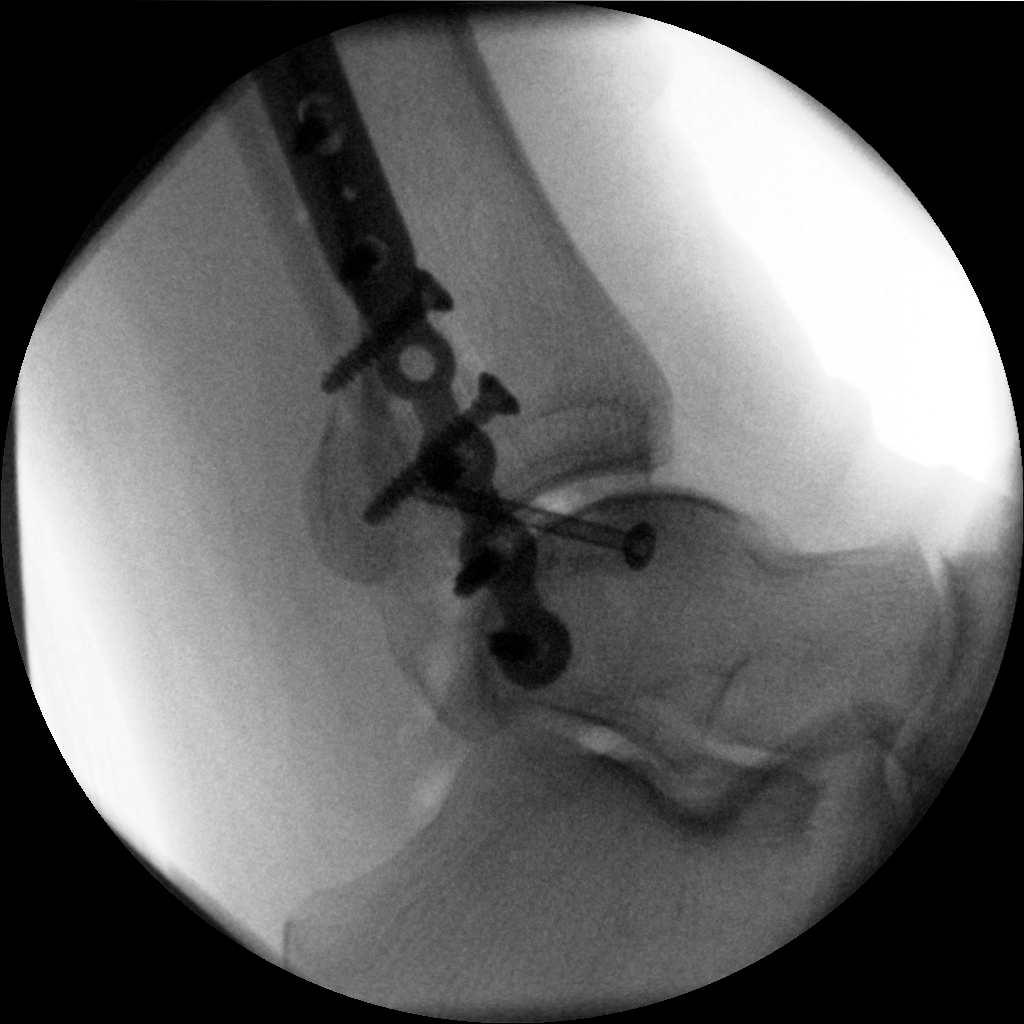

[p2]
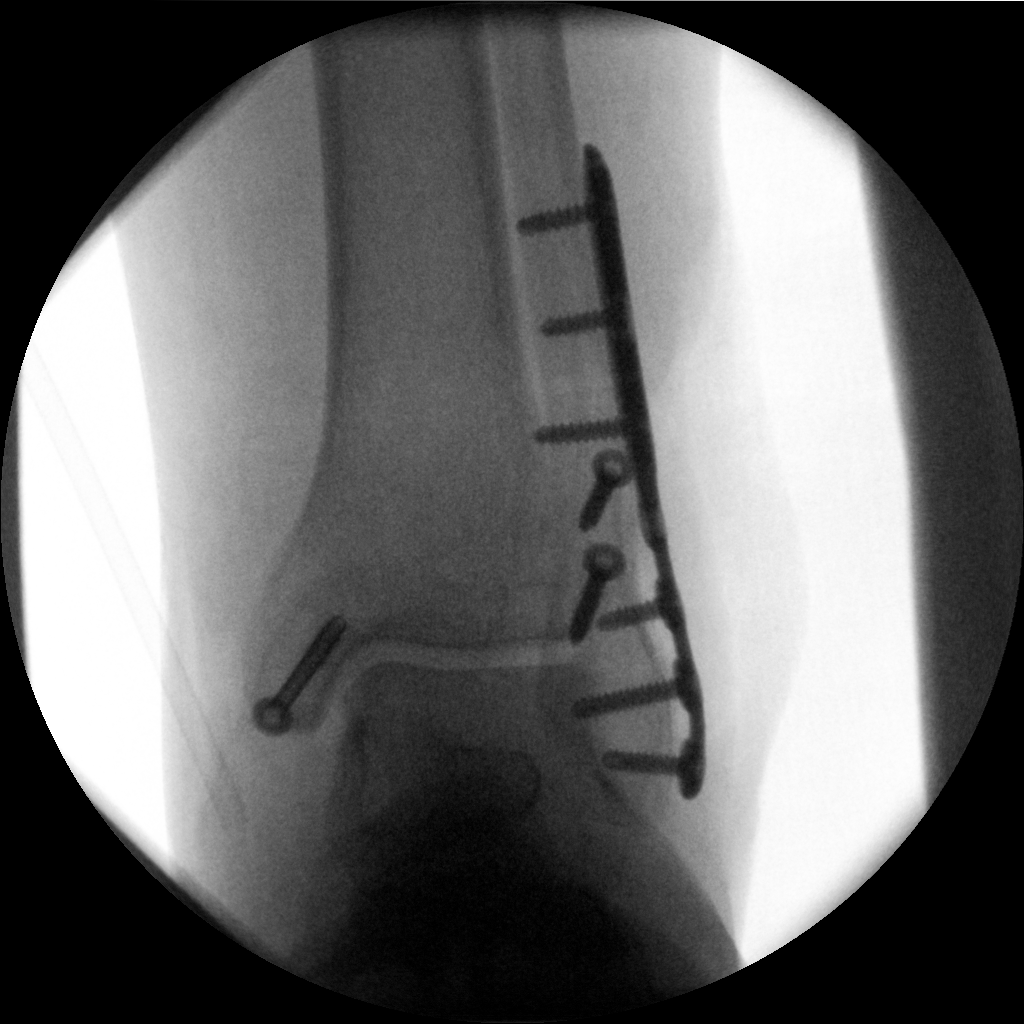

[p1]
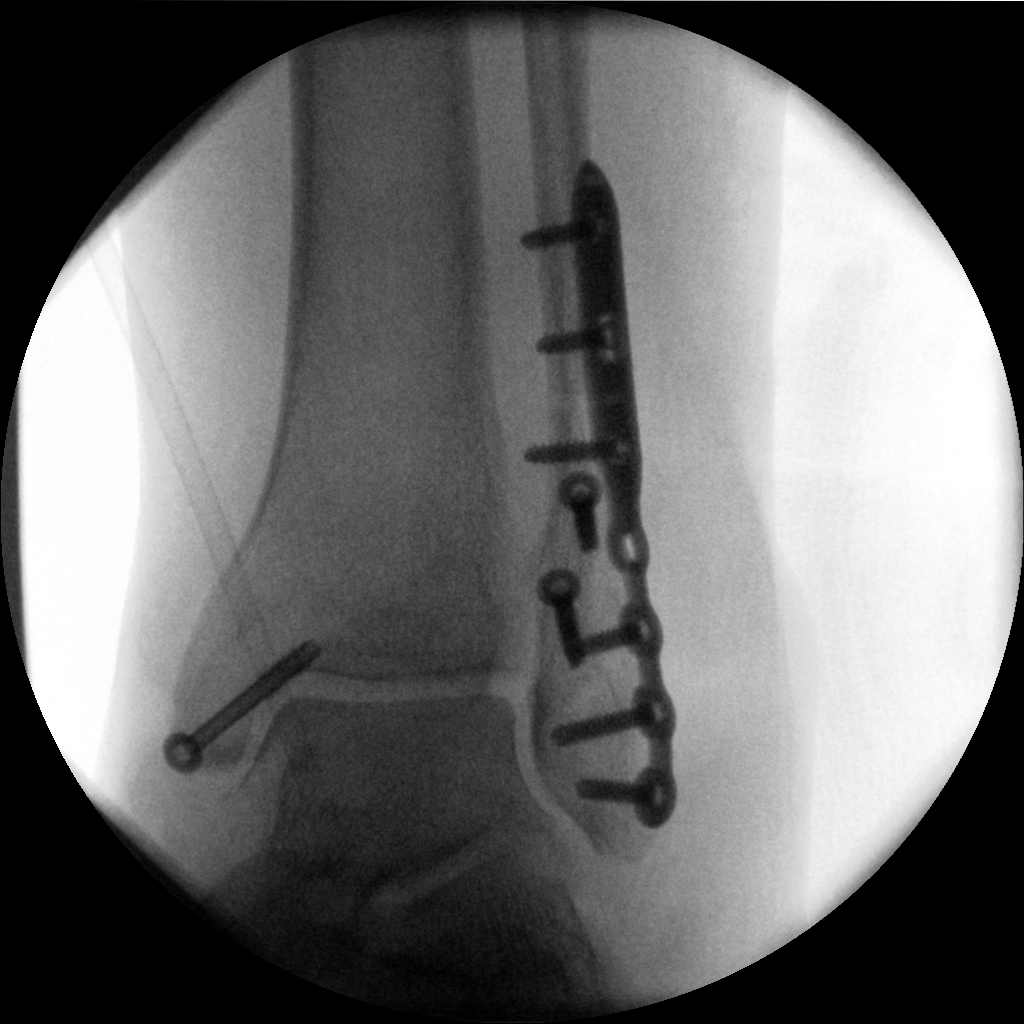

[3 of 3 positions shown; findings below may reference images not displayed]

FINDINGS: Spot fluoroscopic intraoperative views demonstrate plate screw
fixation of the left lateral malleolar fracture. Screw fixation of
the medial malleolar fracture is well. Anatomic alignment except for
minimal residual displacement of the posterior malleolar fragment.
IMPRESSION: Status post ORIF of the lateral and medial malleolar fractures.
Minimal residual displacement of the posterior malleolar fracture.
Preserved left ankle alignment.

## 2017-05-08 NOTE — Progress Notes (Signed)
   BP 121/81   Pulse 62   Temp 98.6 F (37 C)   Wt 167 lb (75.8 kg)   SpO2 99%   BMI 25.39 kg/m    Subjective:    Patient ID: Rick [name], male    DOB: Jan 22, 1941, 77 y.o.   MRN: [id number]  HPI: Rick [name] is a 76 y.o. male  Chief Complaint  Patient presents with  . Ear Pain    R ear, fnshed Amoxl and wants to make sure t's better. Used some Neomycn drops and that helped too.   Patient presents following up for some persistent right ear pain and intermittent muffled hearing. Recently completed course of amoxil, and still taking nasal sprays and sudafed with minimal relief. Denies fever, chills, discharge. 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   Relevant past medical, surgical, family and social history reviewed and updated as indicated. Interim medical history since our last visit reviewed. Allergies and medications reviewed and updated.  Review of Systems  Constitutional: Negative.   HENT: Positive for ear pain and hearing loss.   Respiratory: Negative.   Cardiovascular: Negative.   Gastrointestinal: Negative.   Neurological: Negative.   Psychiatric/Behavioral: Negative.    Per HPI unless specifically indicated above     Objective:    BP 121/81   Pulse 62   Temp 98.6 F (37 C)   Wt 167 lb (75.8 kg)   SpO2 99%   BMI 25.39 kg/m   Wt Readings from Last 3 Encounters:  05/08/17 167 lb (75.8 kg)  04/20/17 170 lb 2 oz (77.2 kg)  03/13/17 171 lb (77.6 kg)    Physical Exam  Constitutional: He is oriented to person, place, and time. He appears well-developed and well-nourished. No distress.  HENT:  Head: Atraumatic.  Right Ear: External ear normal.  Nose: Nose normal.  Mouth/Throat: Oropharynx is clear and moist.  Right middle ear with persistent effusion present. No erythema, purulence, or injection of TM  Eyes: Pupils are equal, round, and  reactive to light. Conjunctivae are normal. No scleral icterus.  Neck: Normal range of motion. Neck supple.  Cardiovascular: Normal rate and normal heart sounds.   Pulmonary/Chest: Effort normal and breath sounds normal. No respiratory distress.  Musculoskeletal: Normal range of motion.  Neurological: He is alert and oriented to person, place, and time.  Skin: Skin is warm and dry.  Psychiatric: He has a normal mood and affect. His behavior is normal.  Nursing note and vitals reviewed.     Assessment & Plan:   Problem List Items Addressed This Visit    None    Visit Diagnoses    Right chronic serous otitis media    -  Primary   Given chronicity and poor response to conservative tx's, will refer to ENT for further eval. Continue current regimen in meantime.    Relevant Orders   Ambulatory referral to ENT       Follow up plan: Return for as scheduled.

## 2017-05-08 NOTE — Patient Instructions (Signed)
Follow up as needed

## 2017-05-14 ENCOUNTER — Encounter: Payer: Self-pay | Admitting: Family Medicine

## 2017-05-14 ENCOUNTER — Ambulatory Visit (INDEPENDENT_AMBULATORY_CARE_PROVIDER_SITE_OTHER): Payer: Medicare Other | Admitting: Family Medicine

## 2017-05-14 VITALS — BP 118/70 | HR 72 | Temp 97.8°F | Wt 168.0 lb

## 2017-05-14 DIAGNOSIS — I739 Peripheral vascular disease, unspecified: Secondary | ICD-10-CM | POA: Insufficient documentation

## 2017-05-14 DIAGNOSIS — R251 Tremor, unspecified: Secondary | ICD-10-CM

## 2017-05-14 NOTE — Patient Instructions (Addendum)

## 2017-05-14 NOTE — Progress Notes (Signed)
BP 118/70 (BP Location: Right Arm, Cuff Size: Normal)   Pulse 72   Temp 97.8 F (36.6 C)   Wt 168 lb (76.2 kg)   SpO2 96%   BMI 25.54 kg/m    Subjective:    Patient ID: Rick [name], male    DOB: July 28, 1941, 76 y.o.   MRN: [id number]  HPI: Rick [name] is a 76 y.o. male  Chief Complaint  Patient presents with  . Tremors   Rick [name] notices that about 5 weeks ago, he started having some shaking in his hand. He thought it would go away, but it doesn't seem to be getting better. He notices that he is feeling OK otherwise. His ear is feeling better. He doesn't notice any particular time that it happens. Not associated with not eating. He thinks that it might be getting a little worse. He notices that it happens primarily when he is driving and feels like his arm shakes all the way up his arm. He denies any paresthesias. He doesn't notice that anything changes the tremor. He notes that his brother in law has Parkinson's disease and he is very concerned that he might have this. He is otherwise doing well.  He notes that when he checks his blood pressure at home, he has been noticing that his L BP is much higher than it has been in the R ear. No claudication. He does note that he has some aches and pains in his arms and legs. He is concerned that this has something to do with this heart. He denies any numbness or tingling. He denies any pain associated with this. He is otherwise doing well with no other concerns or complaints at this time.   Relevant past medical, surgical, family and social history reviewed and updated as indicated. Interim medical history since our last visit reviewed. Allergies and medications reviewed and updated.  Review of Systems  Constitutional: Negative.   Respiratory: Negative.   Cardiovascular: Negative.   Neurological: Positive for tremors. Negative for dizziness, seizures, syncope, facial asymmetry, speech difficulty, weakness, light-headedness, numbness and  headaches.  Psychiatric/Behavioral: Negative.     Per HPI unless specifically indicated above     Objective:    BP 118/70 (BP Location: Right Arm, Cuff Size: Normal)   Pulse 72   Temp 97.8 F (36.6 C)   Wt 168 lb (76.2 kg)   SpO2 96%   BMI 25.54 kg/m   Wt Readings from Last 3 Encounters:  05/14/17 168 lb (76.2 kg)  05/08/17 167 lb (75.8 kg)  04/20/17 170 lb 2 oz (77.2 kg)    Physical Exam  Constitutional: He is oriented to person, place, and time. He appears well-developed and well-nourished. No distress.  HENT:  Head: Normocephalic and atraumatic.  Right Ear: Hearing normal.  Left Ear: Hearing normal.  Nose: Nose normal.  Eyes: Conjunctivae and lids are normal. Right eye exhibits no discharge. Left eye exhibits no discharge. No scleral icterus.  Cardiovascular: Normal rate, regular rhythm, normal heart sounds and intact distal pulses.  Exam reveals no gallop and no friction rub.   No murmur heard. Pulmonary/Chest: Effort normal and breath sounds normal. No respiratory distress. He has no wheezes. He has no rales. He exhibits no tenderness.  Musculoskeletal: Normal range of motion.  Neurological: He is alert and oriented to person, place, and time. He has normal strength and normal reflexes. He displays no atrophy, no tremor and normal reflexes. No cranial nerve deficit. He exhibits normal muscle tone.  He displays a negative Romberg sign. He displays no seizure activity. Coordination normal.  Skin: Skin is warm, dry and intact. No rash noted. He is not diaphoretic. No erythema. No pallor.  Psychiatric: He has a normal mood and affect. His speech is normal and behavior is normal. Judgment and thought content normal. Cognition and memory are normal.  Nursing note and vitals reviewed.   Results for orders placed or performed in visit on 03/09/17  Bayer DCA Hb A1c Waived  Result Value Ref Range   Bayer DCA Hb A1c Waived 5.9 <7.0 %      Assessment & Plan:   Problem List  Items Addressed This Visit      Cardiovascular and Mediastinum   PVD (peripheral vascular disease) ([place])    Significant difference in BPs in arms suggesting PVD- will get him into see vascular for evaluation. Referral generated today.      Relevant Orders   Ambulatory referral to Vascular Surgery    Other Visit Diagnoses    Tremor    -  Primary   No tremor on exam today. We will check bloodwork today. Await results. If not improving or worsening and labs unclear, we will refer to neurology.    Relevant Orders   Comprehensive metabolic panel   CBC with Differential/Platelet   TSH       Follow up plan: Return As scheduled.

## 2017-05-14 NOTE — Assessment & Plan Note (Signed)
Significant difference in BPs in arms suggesting PVD- will get him into see vascular for evaluation. Referral generated today.

## 2017-05-15 ENCOUNTER — Telehealth: Payer: Self-pay | Admitting: Family Medicine

## 2017-05-15 LAB — COMPREHENSIVE METABOLIC PANEL
A/G RATIO: 2 (ref 1.2–2.2)
ALT: 18 IU/L (ref 0–44)
AST: 21 IU/L (ref 0–40)
Albumin: 4.7 g/dL (ref 3.5–4.8)
Alkaline Phosphatase: 44 IU/L (ref 39–117)
BILIRUBIN TOTAL: 0.5 mg/dL (ref 0.0–1.2)
BUN/Creatinine Ratio: 14 (ref 10–24)
BUN: 12 mg/dL (ref 8–27)
CHLORIDE: 106 mmol/L (ref 96–106)
CO2: 23 mmol/L (ref 20–29)
Calcium: 9.6 mg/dL (ref 8.6–10.2)
Creatinine, Ser: 0.85 mg/dL (ref 0.76–1.27)
GFR calc Af Amer: 99 mL/min/{1.73_m2} (ref >59)
GFR calc non Af Amer: 85 mL/min/{1.73_m2} (ref >59)
Globulin, Total: 2.4 g/dL (ref 1.5–4.5)
Glucose: 103 mg/dL — ABNORMAL HIGH (ref 65–99)
POTASSIUM: 4 mmol/L (ref 3.5–5.2)
Sodium: 140 mmol/L (ref 134–144)
Total Protein: 7.1 g/dL (ref 6.0–8.5)

## 2017-05-15 LAB — CBC WITH DIFFERENTIAL/PLATELET
BASOS ABS: 0.1 10*3/uL (ref 0.0–0.2)
Basos: 1 %
EOS (ABSOLUTE): 0.3 10*3/uL (ref 0.0–0.4)
Eos: 4 %
HEMOGLOBIN: 13.5 g/dL (ref 13.0–17.7)
Hematocrit: 38.9 % (ref 37.5–51.0)
IMMATURE GRANS (ABS): 0 10*3/uL (ref 0.0–0.1)
Immature Granulocytes: 0 %
LYMPHS: 32 %
Lymphocytes Absolute: 2.5 10*3/uL (ref 0.7–3.1)
MCH: 28.7 pg (ref 26.6–33.0)
MCHC: 34.7 g/dL (ref 31.5–35.7)
MCV: 83 fL (ref 79–97)
MONOCYTES: 7 %
Monocytes Absolute: 0.6 10*3/uL (ref 0.1–0.9)
NEUTROS PCT: 56 %
Neutrophils Absolute: 4.4 10*3/uL (ref 1.4–7.0)
PLATELETS: 264 10*3/uL (ref 150–379)
RBC: 4.71 x10E6/uL (ref 4.14–5.80)
RDW: 14.4 % (ref 12.3–15.4)
WBC: 7.8 10*3/uL (ref 3.4–10.8)

## 2017-05-15 LAB — TSH: TSH: 2.46 u[IU]/mL (ref 0.450–4.500)

## 2017-05-15 NOTE — Telephone Encounter (Signed)
Patient states that he will see how it does for the next week, and let us know.

## 2017-05-15 NOTE — Telephone Encounter (Signed)
Please let Rick [name] know that his labs all came back normal. If his shakes are not better next time I see him or if they start getting worse we'll get him to see the nerve doctor. (I can also put this referral in now if he wants me to)

## 2017-05-22 ENCOUNTER — Telehealth: Payer: Self-pay | Admitting: Family Medicine

## 2017-05-22 DIAGNOSIS — R251 Tremor, unspecified: Secondary | ICD-10-CM

## 2017-05-22 NOTE — Telephone Encounter (Signed)
They should not be. I will refer him to the neurologist so that they can try to figure out what's giving him the shakes.

## 2017-05-22 NOTE — Telephone Encounter (Signed)
Patient's tremors in hand are getting worse. Patient wants to know if the tremors that are increasing through his arm are associated with his gabapentin medication.    Please Advise.  Thank you

## 2017-05-22 NOTE — Telephone Encounter (Signed)
Routing to provider  

## 2017-05-23 NOTE — Telephone Encounter (Signed)
Tried to call patient, no answer, unable to leave a message. Will try again. If patient returns my call please let him know that Dr.Johnson went ahead and referred him to neurology.

## 2017-05-29 NOTE — Telephone Encounter (Signed)
Patient notified

## 2017-06-07 ENCOUNTER — Other Ambulatory Visit: Payer: Self-pay | Admitting: Family Medicine

## 2017-06-08 ENCOUNTER — Ambulatory Visit (INDEPENDENT_AMBULATORY_CARE_PROVIDER_SITE_OTHER): Payer: Medicare Other | Admitting: Family Medicine

## 2017-06-08 ENCOUNTER — Encounter: Payer: Self-pay | Admitting: Family Medicine

## 2017-06-08 VITALS — BP 121/77 | HR 84 | Temp 98.4°F | Wt 167.6 lb

## 2017-06-08 DIAGNOSIS — H539 Unspecified visual disturbance: Secondary | ICD-10-CM

## 2017-06-08 DIAGNOSIS — I10 Essential (primary) hypertension: Secondary | ICD-10-CM

## 2017-06-08 DIAGNOSIS — E1142 Type 2 diabetes mellitus with diabetic polyneuropathy: Secondary | ICD-10-CM | POA: Diagnosis not present

## 2017-06-08 DIAGNOSIS — E782 Mixed hyperlipidemia: Secondary | ICD-10-CM

## 2017-06-08 DIAGNOSIS — E785 Hyperlipidemia, unspecified: Secondary | ICD-10-CM | POA: Diagnosis not present

## 2017-06-08 DIAGNOSIS — N400 Enlarged prostate without lower urinary tract symptoms: Secondary | ICD-10-CM

## 2017-06-08 LAB — BAYER DCA HB A1C WAIVED: HB A1C: 5.9 % (ref <7.0)

## 2017-06-08 MED ORDER — GABAPENTIN 300 MG PO CAPS: 600.0000 mg | ORAL_CAPSULE | Freq: Two times a day (BID) | ORAL | 1 refills | Status: DC

## 2017-06-08 MED ORDER — INSULIN GLARGINE 100 UNIT/ML SOLOSTAR PEN: 15.0000 [IU] | PEN_INJECTOR | Freq: Every day | SUBCUTANEOUS | 1 refills | Status: DC

## 2017-06-08 MED ORDER — LOVASTATIN 40 MG PO TABS: 40.0000 mg | ORAL_TABLET | Freq: Every day | ORAL | 1 refills | Status: DC

## 2017-06-08 MED ORDER — CARVEDILOL 6.25 MG PO TABS: 6.2500 mg | ORAL_TABLET | Freq: Two times a day (BID) | ORAL | 1 refills | Status: DC

## 2017-06-08 MED ORDER — MIRTAZAPINE 15 MG PO TABS: 15.0000 mg | ORAL_TABLET | Freq: Every day | ORAL | 1 refills | Status: DC

## 2017-06-08 MED ORDER — ENALAPRIL MALEATE 10 MG PO TABS: 10.0000 mg | ORAL_TABLET | Freq: Every day | ORAL | 1 refills | Status: DC

## 2017-06-08 MED ORDER — TRAZODONE HCL 100 MG PO TABS: 100.0000 mg | ORAL_TABLET | Freq: Every day | ORAL | 1 refills | Status: DC

## 2017-06-08 MED ORDER — FINASTERIDE 5 MG PO TABS: 5.0000 mg | ORAL_TABLET | Freq: Every day | ORAL | 1 refills | Status: DC

## 2017-06-08 MED ORDER — METFORMIN HCL 1000 MG PO TABS: ORAL_TABLET | ORAL | 1 refills | Status: DC

## 2017-06-08 MED ORDER — VALACYCLOVIR HCL 1 G PO TABS: 1000.0000 mg | ORAL_TABLET | Freq: Every day | ORAL | 1 refills | Status: DC | PRN

## 2017-06-08 MED ORDER — HYDROCHLOROTHIAZIDE 25 MG PO TABS: ORAL_TABLET | ORAL | 1 refills | Status: DC

## 2017-06-08 NOTE — Patient Instructions (Addendum)
[place] June 08, 2017 3:30 PM [address], [place], [place] [address]  Phone: [phone]

## 2017-06-08 NOTE — Assessment & Plan Note (Signed)
Under good control with A1c of 5.9. Continue current regimen. Continue to monitor. Call with any concerns.  

## 2017-06-08 NOTE — Assessment & Plan Note (Signed)
Under good control. Continue current regimen. Continue to monitor. Call with any concerns. 

## 2017-06-08 NOTE — Progress Notes (Signed)
BP 121/77 (BP Location: Left Arm, Patient Position: Sitting, Cuff Size: Normal)   Pulse 84   Temp 98.4 F (36.9 C)   Wt 167 lb 9 oz (76 kg)   SpO2 99%   BMI 25.48 kg/m    Subjective:    Patient ID: Rick [name], male    DOB: 1941/07/29, 76 y.o.   MRN: [id number]  HPI: Rick [name] is a 76 y.o. male  Chief Complaint  Patient presents with  . Diabetes  . Hyperlipidemia  . Hypertension  . Eye Problem    Patient states that he woke up with the corner of the pillow case in his eye,now he is having trpuble seeing out of it. Left   HYPERTENSION / [place] Satisfied with current treatment? yes Duration of hypertension: chronic BP monitoring frequency: not checking BP medication side effects: no Past BP meds: carvedilol, enalapril, hctz Duration of hyperlipidemia: chronic Cholesterol medication side effects: no Cholesterol supplements: none Past cholesterol medications: lovastatin (mevacor) Medication compliance: excellent compliance Aspirin: yes Recent stressors: no Recurrent headaches: no Visual changes: no Palpitations: no Dyspnea: no Chest pain: no Lower extremity edema: no Dizzy/lightheaded: no  DIABETES Hypoglycemic episodes:no Polydipsia/polyuria: no Visual disturbance: no Chest pain: no Paresthesias: no Glucose Monitoring: yes  Accucheck frequency: TID Taking Insulin?: yes Blood Pressure Monitoring: not checking Retinal Examination: Up to Date Foot Exam: Up to Date Diabetic Education: Completed Pneumovax: Up to Date Influenza: Up to Date Aspirin: yes  EYE PROBLEM- Woke up this eye and can't see well out of his L eye Duration:  This AM Involved eye:  left Onset: sudden Severity: mild  Quality: dull and sore Foreign body sensation:no Visual impairment: yes Eye redness: no Discharge: no Crusting or matting of eyelids: no Swelling: no Photophobia: no Itching: no Tearing: no Headache: no Floaters: no URI symptoms: no Contact  lens use: no Close contacts with similar problems: no Eye trauma: no Status: stable  Relevant past medical, surgical, family and social history reviewed and updated as indicated. Interim medical history since our last visit reviewed. Allergies and medications reviewed and updated.  Review of Systems  Constitutional: Negative.   Eyes: Positive for visual disturbance. Negative for photophobia, pain, discharge, redness and itching.  Respiratory: Negative.   Cardiovascular: Negative.   Psychiatric/Behavioral: Negative.     Per HPI unless specifically indicated above     Objective:    BP 121/77 (BP Location: Left Arm, Patient Position: Sitting, Cuff Size: Normal)   Pulse 84   Temp 98.4 F (36.9 C)   Wt 167 lb 9 oz (76 kg)   SpO2 99%   BMI 25.48 kg/m   Wt Readings from Last 3 Encounters:  06/08/17 167 lb 9 oz (76 kg)  05/14/17 168 lb (76.2 kg)  05/08/17 167 lb (75.8 kg)    Physical Exam  Constitutional: He is oriented to person, place, and time. He appears well-developed and well-nourished. No distress.  HENT:  Head: Normocephalic and atraumatic.  Right Ear: Hearing normal.  Left Ear: Hearing normal.  Nose: Nose normal.  Eyes: Pupils are equal, round, and reactive to light. Conjunctivae, EOM and lids are normal. Right eye exhibits no discharge. Left eye exhibits no discharge. No scleral icterus.  Cardiovascular: Normal rate, regular rhythm, normal heart sounds and intact distal pulses.  Exam reveals no gallop and no friction rub.   No murmur heard. Pulmonary/Chest: Effort normal. No respiratory distress. He has no wheezes. He has no rales.  Musculoskeletal: Normal range of motion.  Neurological: He is alert and oriented to person, place, and time.  Skin: Skin is warm, dry and intact. No rash noted. He is not diaphoretic. No erythema. No pallor.  Psychiatric: He has a normal mood and affect. His speech is normal and behavior is normal. Judgment and thought content normal.  Cognition and memory are normal.  Nursing note and vitals reviewed.   Results for orders placed or performed in visit on 05/14/17  Comprehensive metabolic panel  Result Value Ref Range   Glucose 103 (H) 65 - 99 mg/dL   BUN 12 8 - 27 mg/dL   Creatinine, Ser 0.85 0.76 - 1.27 mg/dL   GFR calc non Af Amer 85 >59 mL/min/1.73   GFR calc Af Amer 99 >59 mL/min/1.73   BUN/Creatinine Ratio 14 10 - 24   Sodium 140 134 - 144 mmol/L   Potassium 4.0 3.5 - 5.2 mmol/L   Chloride 106 96 - 106 mmol/L   CO2 23 20 - 29 mmol/L   Calcium 9.6 8.6 - 10.2 mg/dL   Total Protein 7.1 6.0 - 8.5 g/dL   Albumin 4.7 3.5 - 4.8 g/dL   Globulin, Total 2.4 1.5 - 4.5 g/dL   Albumin/Globulin Ratio 2.0 1.2 - 2.2   Bilirubin Total 0.5 0.0 - 1.2 mg/dL   Alkaline Phosphatase 44 39 - 117 IU/L   AST 21 0 - 40 IU/L   ALT 18 0 - 44 IU/L  CBC with Differential/Platelet  Result Value Ref Range   WBC 7.8 3.4 - 10.8 x10E3/uL   RBC 4.71 4.14 - 5.80 x10E6/uL   Hemoglobin 13.5 13.0 - 17.7 g/dL   Hematocrit 38.9 37.5 - 51.0 %   MCV 83 79 - 97 fL   MCH 28.7 26.6 - 33.0 pg   MCHC 34.7 31.5 - 35.7 g/dL   RDW 14.4 12.3 - 15.4 %   Platelets 264 150 - 379 x10E3/uL   Neutrophils 56 Not Estab. %   Lymphs 32 Not Estab. %   Monocytes 7 Not Estab. %   Eos 4 Not Estab. %   Basos 1 Not Estab. %   Neutrophils Absolute 4.4 1.4 - 7.0 x10E3/uL   Lymphocytes Absolute 2.5 0.7 - 3.1 x10E3/uL   Monocytes Absolute 0.6 0.1 - 0.9 x10E3/uL   EOS (ABSOLUTE) 0.3 0.0 - 0.4 x10E3/uL   Basophils Absolute 0.1 0.0 - 0.2 x10E3/uL   Immature Granulocytes 0 Not Estab. %   Immature Grans (Abs) 0.0 0.0 - 0.1 x10E3/uL  TSH  Result Value Ref Range   TSH 2.460 0.450 - 4.500 uIU/mL      Assessment & Plan:   Problem List Items Addressed This Visit      Cardiovascular and Mediastinum   Hypertension - Primary    Under good control. Continue current regimen. Continue to monitor. Call with any concerns.       Relevant Medications   enalapril (VASOTEC)  10 MG tablet   carvedilol (COREG) 6.25 MG tablet   hydrochlorothiazide (HYDRODIURIL) 25 MG tablet   lovastatin (MEVACOR) 40 MG tablet   Other Relevant Orders   Comprehensive metabolic panel     Endocrine   Diabetic peripheral neuropathy associated with type 2 diabetes mellitus ([place])    Under good control with A1c of 5.9. Continue current regimen. Continue to monitor. Call with any concerns.       Relevant Medications   enalapril (VASOTEC) 10 MG tablet   gabapentin (NEURONTIN) 300 MG capsule   Insulin Glargine (LANTUS SOLOSTAR) 100 UNIT/ML Solostar  Pen   lovastatin (MEVACOR) 40 MG tablet   metFORMIN (GLUCOPHAGE) 1000 MG tablet   mirtazapine (REMERON) 15 MG tablet   traZODone (DESYREL) 100 MG tablet   Other Relevant Orders   Bayer DCA Hb A1c Waived   Comprehensive metabolic panel     Genitourinary   Benign fibroma of prostate   Relevant Medications   finasteride (PROSCAR) 5 MG tablet     Other   Hyperlipidemia    Under good control. Continue current regimen. Continue to monitor. Call with any concerns.       Relevant Medications   enalapril (VASOTEC) 10 MG tablet   carvedilol (COREG) 6.25 MG tablet   hydrochlorothiazide (HYDRODIURIL) 25 MG tablet   lovastatin (MEVACOR) 40 MG tablet   Other Relevant Orders   Comprehensive metabolic panel   Lipid Panel w/o Chol/HDL Ratio    Other Visit Diagnoses    Dyslipidemia       Relevant Medications   lovastatin (MEVACOR) 40 MG tablet   Change in vision       Will get him into see [facility]- appointment made for this afternoon. Call and follow up with them.        Follow up plan: Return in about 3 months (around 09/08/2017) for DM follow up.

## 2017-06-09 LAB — COMPREHENSIVE METABOLIC PANEL
A/G RATIO: 1.8 (ref 1.2–2.2)
ALBUMIN: 4.4 g/dL (ref 3.5–4.8)
ALT: 17 IU/L (ref 0–44)
AST: 20 IU/L (ref 0–40)
Alkaline Phosphatase: 47 IU/L (ref 39–117)
BILIRUBIN TOTAL: 0.5 mg/dL (ref 0.0–1.2)
BUN / CREAT RATIO: 15 (ref 10–24)
BUN: 13 mg/dL (ref 8–27)
CHLORIDE: 104 mmol/L (ref 96–106)
CO2: 21 mmol/L (ref 20–29)
Calcium: 9.8 mg/dL (ref 8.6–10.2)
Creatinine, Ser: 0.86 mg/dL (ref 0.76–1.27)
GFR calc non Af Amer: 85 mL/min/{1.73_m2} (ref >59)
GFR, EST AFRICAN AMERICAN: 98 mL/min/{1.73_m2} (ref >59)
GLOBULIN, TOTAL: 2.4 g/dL (ref 1.5–4.5)
Glucose: 99 mg/dL (ref 65–99)
POTASSIUM: 4.2 mmol/L (ref 3.5–5.2)
SODIUM: 142 mmol/L (ref 134–144)
TOTAL PROTEIN: 6.8 g/dL (ref 6.0–8.5)

## 2017-06-09 LAB — LIPID PANEL W/O CHOL/HDL RATIO
Cholesterol, Total: 155 mg/dL (ref 100–199)
HDL: 45 mg/dL (ref >39)
LDL Calculated: 74 mg/dL (ref 0–99)
Triglycerides: 178 mg/dL — ABNORMAL HIGH (ref 0–149)
VLDL Cholesterol Cal: 36 mg/dL (ref 5–40)

## 2017-06-11 ENCOUNTER — Encounter: Payer: Self-pay | Admitting: Family Medicine

## 2017-06-11 DIAGNOSIS — R251 Tremor, unspecified: Secondary | ICD-10-CM | POA: Diagnosis not present

## 2017-08-17 ENCOUNTER — Telehealth: Payer: Self-pay | Admitting: *Deleted

## 2017-08-17 NOTE — Telephone Encounter (Signed)
Perfect. Thanks.

## 2017-08-17 NOTE — Telephone Encounter (Signed)
Contacted patient, as requested by patient when initially contacted in June 2018, regarding consideration of lung screening scan. Patient reports he is in the middle of evaluation of prostate issues and wants me to contact him in 6 months.

## 2017-08-20 ENCOUNTER — Ambulatory Visit (INDEPENDENT_AMBULATORY_CARE_PROVIDER_SITE_OTHER): Payer: Medicare Other | Admitting: Family Medicine

## 2017-08-20 ENCOUNTER — Ambulatory Visit: Payer: Self-pay | Admitting: Family Medicine

## 2017-08-20 ENCOUNTER — Encounter: Payer: Self-pay | Admitting: Family Medicine

## 2017-08-20 ENCOUNTER — Telehealth: Payer: Self-pay | Admitting: Family Medicine

## 2017-08-20 VITALS — BP 112/79 | HR 77 | Temp 97.7°F | Wt 167.6 lb

## 2017-08-20 DIAGNOSIS — M159 Polyosteoarthritis, unspecified: Secondary | ICD-10-CM | POA: Diagnosis not present

## 2017-08-20 DIAGNOSIS — N390 Urinary tract infection, site not specified: Secondary | ICD-10-CM

## 2017-08-20 DIAGNOSIS — N138 Other obstructive and reflux uropathy: Secondary | ICD-10-CM | POA: Diagnosis not present

## 2017-08-20 DIAGNOSIS — R300 Dysuria: Secondary | ICD-10-CM | POA: Diagnosis not present

## 2017-08-20 DIAGNOSIS — N4281 Prostatodynia syndrome: Secondary | ICD-10-CM

## 2017-08-20 DIAGNOSIS — N401 Enlarged prostate with lower urinary tract symptoms: Secondary | ICD-10-CM | POA: Diagnosis not present

## 2017-08-20 MED ORDER — CIPROFLOXACIN HCL 500 MG PO TABS: 500.0000 mg | ORAL_TABLET | Freq: Two times a day (BID) | ORAL | 0 refills | Status: DC

## 2017-08-20 MED ORDER — DICLOFENAC SODIUM 1 % TD GEL: 2.0000 g | Freq: Four times a day (QID) | TRANSDERMAL | 0 refills | Status: DC

## 2017-08-20 NOTE — Telephone Encounter (Signed)
Patient notified

## 2017-08-20 NOTE — Telephone Encounter (Signed)
First two attempts to call got a busy signal, most recent attempt to call rang many times and never went to voicemail. Sent in 2 weeks of cipro for a UTI that came out on his urinalysis.   Diclofenac was sent at lowest dosage. There is no 0.1% that I'm aware.

## 2017-08-20 NOTE — Telephone Encounter (Signed)
Dropped off urine specimen.. States he is 95% sure he has an infection going on and would like to have Cipro sent if ASAP.Rick [name] He also wanted me to let you know that he is hoping the other medication you were recommending could be changed from 1% to .1% which the pharmacist states would be cheaper if so.  [place]  Thank you

## 2017-08-20 NOTE — Telephone Encounter (Signed)
Routing to provider  

## 2017-08-20 NOTE — Progress Notes (Signed)
BP 112/79 (BP Location: Right Arm, Patient Position: Sitting, Cuff Size: Normal)   Pulse 77   Temp 97.7 F (36.5 C) (Oral)   Wt 167 lb 9.6 oz (76 kg)   SpO2 100%   BMI 25.48 kg/m    Subjective:    Patient ID: Rick [name], male    DOB: 05-31-1941, 77 y.o.   MRN: [id number]  HPI: Rick [name] is a 77 y.o. male  Chief Complaint  Patient presents with  . Arthritis    Patient states he has arthritis in his hands real bad that comes and goes. But he's unable to grip things.  . Prostate Check    Patient would like his prostate examined due to urinary symptoms.  Pt with long hx of BPH, bladder cancer, chronic prostatitis, and urinary retention here today with several weeks of cloudy urine, dysuria, and suprapubic pressure. Denies fevers, chills, new back pain, N/V. Also notes he has not had a prostate exam in 10 years and is incredibly concerned about prostate cancer with these recurring sxs. Is insistent on one today regardless of the fact that he has an appt with Urology in about a week.   Also having continued issues with hand arthritis, with weakness and stiffness particularly at bases of thumbs. States he currently takes 9-10 baby aspirins daily and has for 40 years, "this is what works for him". Takes them with a cup of yogurt to "coat his stomach".   Past Medical History:  Diagnosis Date  . Anxiety   . Bipolar disorder ([place])   . Bladder cancer ([place])   . BPH (benign prostatic hypertrophy)   . Chest pain, atypical 06/23/2015  . Depression   . Diabetes mellitus without complication ([place])   . Difficult or painful urination 10/31/2012  . Dysrhythmia   . Ear problem 03/24/2015  . Gangrenous appendicitis   . H/O urinary disorder 03/27/2013  . Herpes genitalis in men   . Hyperlipidemia   . Hypertension   . Insomnia   . Kidney stones   . Left hand pain 05/18/2015  . Mass of arm 02/10/2015  . Peripheral neuropathy   . Skin cancer   . UD (urethral discharge) 10/31/2012    Social History   Socioeconomic History  . Marital status: Divorced    Spouse name: Not on file  . Number of children: Not on file  . Years of education: Not on file  . Highest education level: Not on file  Social Needs  . Financial resource strain: Not on file  . Food insecurity - worry: Not on file  . Food insecurity - inability: Not on file  . Transportation needs - medical: Not on file  . Transportation needs - non-medical: Not on file  Occupational History  . Not on file  Tobacco Use  . Smoking status: Former Smoker    Packs/day: 1.50    Years: 15.00    Pack years: 22.50    Types: Cigarettes    Start date: 08/14/1994    Last attempt to quit: 03/23/2005    Years since quitting: 12.4  . Smokeless tobacco: Former Systems developer    Types: Snuff, Sarina Ser    Quit date: 03/23/2010  Substance and Sexual Activity  . Alcohol use: No    Alcohol/week: 0.0 oz  . Drug use: No  . Sexual activity: No  Other Topics Concern  . Not on file  Social History Narrative  . Not on file   Relevant past medical, surgical, family  and social history reviewed and updated as indicated. Interim medical history since our last visit reviewed. Allergies and medications reviewed and updated.  Review of Systems  Constitutional: Negative.   HENT: Negative.   Respiratory: Negative.   Gastrointestinal: Negative.   Genitourinary: Positive for difficulty urinating and dysuria.       Suprapubic pressure Cloudy urine  Musculoskeletal: Positive for arthralgias.  Neurological: Negative.   Psychiatric/Behavioral: Negative.    Per HPI unless specifically indicated above     Objective:    BP 112/79 (BP Location: Right Arm, Patient Position: Sitting, Cuff Size: Normal)   Pulse 77   Temp 97.7 F (36.5 C) (Oral)   Wt 167 lb 9.6 oz (76 kg)   SpO2 100%   BMI 25.48 kg/m   Wt Readings from Last 3 Encounters:  08/20/17 167 lb 9.6 oz (76 kg)  06/08/17 167 lb 9 oz (76 kg)  05/14/17 168 lb (76.2 kg)    Physical  Exam  Constitutional: He appears well-developed and well-nourished. No distress.  HENT:  Head: Atraumatic.  Eyes: Conjunctivae are normal. Pupils are equal, round, and reactive to light. No scleral icterus.  Neck: Normal range of motion. Neck supple.  Cardiovascular: Normal rate.  Pulmonary/Chest: Effort normal and breath sounds normal. No respiratory distress.  Genitourinary:  Genitourinary Comments: Prostate enlarged, mildly ttp  Musculoskeletal: Normal range of motion. He exhibits deformity (OA changes to b/l hands, particularly squaring at bases of thumbs). He exhibits no tenderness (No CVA tenderness).  Neurological: He is alert.  Skin: Skin is warm and dry. No erythema.  Psychiatric:  Pt's behavior is eccentric and argumentative, which is his baseline  Nursing note and vitals reviewed.   Results for orders placed or performed in visit on 08/20/17  Microscopic Examination  Result Value Ref Range   WBC, UA >30 (A) 0 - 5 /hpf   RBC, UA 0-2 0 - 2 /hpf   Epithelial Cells (non renal) 0-10 0 - 10 /hpf   Renal Epithel, UA 0-10 (A) None seen /hpf   Bacteria, UA Many (A) None seen/Few  Urine Culture, Reflex  Result Value Ref Range   Urine Culture, Routine Preliminary report (A)    Organism ID, Bacteria Escherichia coli (A)   UA/M w/rflx Culture, Routine  Result Value Ref Range   Specific Gravity, UA 1.020 1.005 - 1.030   pH, UA 5.0 5.0 - 7.5   Color, UA Straw Yellow   Appearance Ur Turbid (A) Clear   Leukocytes, UA 3+ (A) Negative   Protein, UA 1+ (A) Negative/Trace   Glucose, UA Negative Negative   Ketones, UA Negative Negative   RBC, UA 2+ (A) Negative   Bilirubin, UA Negative Negative   Urobilinogen, Ur 0.2 0.2 - 1.0 mg/dL   Nitrite, UA Positive (A) Negative   Microscopic Examination See below:    Urinalysis Reflex Comment   PSA  Result Value Ref Range   Prostate Specific Ag, Serum 1.6 0.0 - 4.0 ng/mL      Assessment & Plan:   Problem List Items Addressed This  Visit      Musculoskeletal and Integument   Osteoarthritis of multiple joints    Strongly recommended he only take 1 baby aspirin daily for protective qualities and switch to naproxen or meloxicam along with prilosec for GI protection. Pt became quite combative at this suggestion, very defensive saying he knows his body and he's the only one looking out for him and 9-10 aspirins daily has worked for 40 years  and that's what he will stick with. Tried suggesting diclofenac gel to help him at least cut back on his oral intake, unclear if pt will be willing to try this.         Genitourinary   Benign prostatic hyperplasia with urinary obstruction    Prostate enlarged on digital exam, but not boggy. Has appt next week with Urology as he's been lost to f/u with them for quite some time. Will run a PSA today to reassure pt as he's quite concerned at this time about prostate cancer. Pt aware that he will have to have a more in depth exam at his upcoming appt.        Other Visit Diagnoses    Acute lower UTI    -  Primary   U/A + for infection, will start 2 weeks of cipro and await cx. Has urology f/u next week, they can recheck progress   Relevant Orders   UA/M w/rflx Culture, Routine (Completed)   Tenderness of prostate       Relevant Orders   PSA (Completed)       Follow up plan: Return for as scheduled.

## 2017-08-21 LAB — PSA: PROSTATE SPECIFIC AG, SERUM: 1.6 ng/mL (ref 0.0–4.0)

## 2017-08-23 ENCOUNTER — Telehealth: Payer: Self-pay | Admitting: Family Medicine

## 2017-08-23 DIAGNOSIS — M159 Polyosteoarthritis, unspecified: Secondary | ICD-10-CM | POA: Insufficient documentation

## 2017-08-23 NOTE — Patient Instructions (Signed)
Follow up as scheduled.  

## 2017-08-23 NOTE — Assessment & Plan Note (Signed)
Strongly recommended he only take 1 baby aspirin daily for protective qualities and switch to naproxen or meloxicam along with prilosec for GI protection. Pt became quite combative at this suggestion, very defensive saying he knows his body and he's the only one looking out for him and 9-10 aspirins daily has worked for 40 years and that's what he will stick with. Tried suggesting diclofenac gel to help him at least cut back on his oral intake, unclear if pt will be willing to try this.

## 2017-08-23 NOTE — Telephone Encounter (Signed)
Please let him know that his urine grew out e coli. He should finish his antibiotics. Thanks!

## 2017-08-23 NOTE — Assessment & Plan Note (Signed)
Prostate enlarged on digital exam, but not boggy. Has appt next week with Urology as he's been lost to f/u with them for quite some time. Will run a PSA today to reassure pt as he's quite concerned at this time about prostate cancer. Pt aware that he will have to have a more in depth exam at his upcoming appt.

## 2017-08-23 NOTE — Telephone Encounter (Signed)
Patient notified

## 2017-08-24 ENCOUNTER — Telehealth: Payer: Self-pay | Admitting: Family Medicine

## 2017-08-24 ENCOUNTER — Other Ambulatory Visit: Payer: Self-pay | Admitting: Family Medicine

## 2017-08-24 LAB — UA/M W/RFLX CULTURE, ROUTINE
BILIRUBIN UA: NEGATIVE
Glucose, UA: NEGATIVE
KETONES UA: NEGATIVE
Nitrite, UA: POSITIVE — AB
Specific Gravity, UA: 1.02 (ref 1.005–1.030)
UUROB: 0.2 mg/dL (ref 0.2–1.0)
pH, UA: 5 (ref 5.0–7.5)

## 2017-08-24 LAB — URINE CULTURE, REFLEX

## 2017-08-24 LAB — MICROSCOPIC EXAMINATION: WBC, UA: >30 /hpf — AB (ref 0–?)

## 2017-08-24 MED ORDER — PHENAZOPYRIDINE HCL 200 MG PO TABS: 200.0000 mg | ORAL_TABLET | Freq: Three times a day (TID) | ORAL | 0 refills | Status: DC | PRN

## 2017-08-24 NOTE — Telephone Encounter (Signed)
Patient notified

## 2017-08-24 NOTE — Telephone Encounter (Signed)
We haven't gotten the sensitivity and specificity report back yet but will let him know once he does. For now, keep taking the cipro. I can send over some pyridium for the burning if needed  Copied from [place] [id number]. Topic: Quick Communication - See Telephone Encounter >> Aug 24, 2017  3:09 PM [place], Laban Emperor wrote: CRM for notification. See Telephone encounter for:  Pt states he is still burning below and would like the provider or the nurse to give him a call back.  08/24/17.

## 2017-08-27 ENCOUNTER — Telehealth: Payer: Self-pay | Admitting: Family Medicine

## 2017-08-27 MED ORDER — AMOXICILLIN-POT CLAVULANATE 875-125 MG PO TABS: 1.0000 | ORAL_TABLET | Freq: Two times a day (BID) | ORAL | 0 refills | Status: DC

## 2017-08-27 NOTE — Telephone Encounter (Signed)
Please let him know his labs came back and showed resistance to cipro so I sent over augmentin for him to start

## 2017-08-27 NOTE — Telephone Encounter (Signed)
Patient notified

## 2017-08-27 NOTE — Telephone Encounter (Signed)
Tried to call patient, no answer, unable to leave a message, will try again.

## 2017-08-30 ENCOUNTER — Ambulatory Visit: Payer: Medicare Other | Admitting: Urology

## 2017-09-06 ENCOUNTER — Encounter: Payer: Self-pay | Admitting: Family Medicine

## 2017-09-06 ENCOUNTER — Ambulatory Visit (INDEPENDENT_AMBULATORY_CARE_PROVIDER_SITE_OTHER): Payer: Medicare Other | Admitting: Family Medicine

## 2017-09-06 VITALS — BP 134/78 | HR 64 | Temp 99.0°F | Wt 171.2 lb

## 2017-09-06 DIAGNOSIS — E1142 Type 2 diabetes mellitus with diabetic polyneuropathy: Secondary | ICD-10-CM | POA: Diagnosis not present

## 2017-09-06 DIAGNOSIS — N390 Urinary tract infection, site not specified: Secondary | ICD-10-CM

## 2017-09-06 LAB — UA/M W/RFLX CULTURE, ROUTINE
BILIRUBIN UA: NEGATIVE
GLUCOSE, UA: NEGATIVE
KETONES UA: NEGATIVE
NITRITE UA: NEGATIVE
Protein, UA: NEGATIVE
RBC UA: NEGATIVE
UUROB: 0.2 mg/dL (ref 0.2–1.0)
pH, UA: 5 (ref 5.0–7.5)

## 2017-09-06 LAB — BAYER DCA HB A1C WAIVED: HB A1C: 6.2 % (ref <7.0)

## 2017-09-06 LAB — MICROSCOPIC EXAMINATION: Bacteria, UA: NONE SEEN

## 2017-09-06 NOTE — Progress Notes (Signed)
BP 134/78 (BP Location: Left Arm, Patient Position: Sitting, Cuff Size: Normal)   Pulse 64   Temp 99 F (37.2 C)   Wt 171 lb 3 oz (77.7 kg)   SpO2 97%   BMI 26.03 kg/m    Subjective:    Patient ID: Rick [name], male    DOB: [birth year], 77 y.o.   MRN: [id number]  HPI: Rick [name] is a 77 y.o. male  Chief Complaint  Patient presents with  . Diabetes    follow up    DIABETES Hypoglycemic episodes:no Polydipsia/polyuria: no Visual disturbance: no Chest pain: no Paresthesias: no Glucose Monitoring: yes  Accucheck frequency: BID Taking Insulin?: yes Blood Pressure Monitoring: not checking Retinal Examination: Up to Date Foot Exam: Up to Date Diabetic Education: Completed Pneumovax: Up to Date Influenza: Up to Date Aspirin: no  Had a UTI at the beginning of the month. Bacteria was resistant. Has been taking his augmentin. Feeling better. Seeing the urologist next week.   Relevant past medical, surgical, family and social history reviewed and updated as indicated. Interim medical history since our last visit reviewed. Allergies and medications reviewed and updated.  Review of Systems  Constitutional: Negative.   Respiratory: Negative.   Cardiovascular: Negative.   Genitourinary: Negative.   Psychiatric/Behavioral: Negative.     Per HPI unless specifically indicated above     Objective:    BP 134/78 (BP Location: Left Arm, Patient Position: Sitting, Cuff Size: Normal)   Pulse 64   Temp 99 F (37.2 C)   Wt 171 lb 3 oz (77.7 kg)   SpO2 97%   BMI 26.03 kg/m   Wt Readings from Last 3 Encounters:  09/06/17 171 lb 3 oz (77.7 kg)  08/20/17 167 lb 9.6 oz (76 kg)  06/08/17 167 lb 9 oz (76 kg)    Physical Exam  Constitutional: He is oriented to person, place, and time. He appears well-developed and well-nourished. No distress.  HENT:  Head: Normocephalic and atraumatic.  Right Ear: Hearing normal.  Left Ear: Hearing normal.  Nose: Nose normal.    Eyes: Conjunctivae and lids are normal. Right eye exhibits no discharge. Left eye exhibits no discharge. No scleral icterus.  Cardiovascular: Normal rate, regular rhythm, normal heart sounds and intact distal pulses. Exam reveals no gallop and no friction rub.  No murmur heard. Pulmonary/Chest: Effort normal and breath sounds normal. No respiratory distress. He has no wheezes. He has no rales. He exhibits no tenderness.  Musculoskeletal: Normal range of motion.  Neurological: He is alert and oriented to person, place, and time.  Skin: Skin is warm, dry and intact. No rash noted. He is not diaphoretic. No erythema. No pallor.  Psychiatric: He has a normal mood and affect. His speech is normal and behavior is normal. Judgment and thought content normal. Cognition and memory are normal.  Nursing note and vitals reviewed.   Results for orders placed or performed in visit on 08/20/17  Microscopic Examination  Result Value Ref Range   WBC, UA >30 (A) 0 - 5 /hpf   RBC, UA 0-2 0 - 2 /hpf   Epithelial Cells (non renal) 0-10 0 - 10 /hpf   Renal Epithel, UA 0-10 (A) None seen /hpf   Bacteria, UA Many (A) None seen/Few  Urine Culture, Reflex  Result Value Ref Range   Urine Culture, Routine Final report (A)    Organism ID, Bacteria Escherichia coli (A)    Antimicrobial Susceptibility Comment   UA/M w/rflx Culture,  Routine  Result Value Ref Range   Specific Gravity, UA 1.020 1.005 - 1.030   pH, UA 5.0 5.0 - 7.5   Color, UA Straw Yellow   Appearance Ur Turbid (A) Clear   Leukocytes, UA 3+ (A) Negative   Protein, UA 1+ (A) Negative/Trace   Glucose, UA Negative Negative   Ketones, UA Negative Negative   RBC, UA 2+ (A) Negative   Bilirubin, UA Negative Negative   Urobilinogen, Ur 0.2 0.2 - 1.0 mg/dL   Nitrite, UA Positive (A) Negative   Microscopic Examination See below:    Urinalysis Reflex Comment   PSA  Result Value Ref Range   Prostate Specific Ag, Serum 1.6 0.0 - 4.0 ng/mL       Assessment & Plan:   Problem List Items Addressed This Visit      Endocrine   Diabetic peripheral neuropathy associated with type 2 diabetes mellitus ([place]) - Primary    Under good control with A1c of 6.2- continue current regimen. Call with any concerns.       Relevant Orders   Bayer DCA Hb A1c Waived    Other Visit Diagnoses    Acute lower UTI       Resolved. UA trace leuks on dip stick, but normal microscopic. Call with any concerns.    Relevant Orders   UA/M w/rflx Culture, Routine       Follow up plan: Return in about 3 months (around 12/05/2017) for Medicare wellness.

## 2017-09-06 NOTE — Assessment & Plan Note (Signed)
Under good control with A1c of 6.2- continue current regimen. Call with any concerns.

## 2017-09-07 ENCOUNTER — Telehealth: Payer: Self-pay | Admitting: Family Medicine

## 2017-09-07 NOTE — Telephone Encounter (Signed)
Routing to provider  

## 2017-09-07 NOTE — Telephone Encounter (Signed)
He's seeing urology on Monday. Should talk to them about it. If still has questions after appointment on Monday, I will talk to him then.

## 2017-09-07 NOTE — Telephone Encounter (Signed)
Copied from [place] [id number]. Topic: Quick Communication - See Telephone Encounter >> Sep 07, 2017  2:47 PM Robina Ade, Helene Kelp D wrote: CRM for notification. See Telephone encounter for: 09/07/17. Patient called and said that he needs to talk to Dr. Wynetta Emery about his visit yesterday. He said that when he urinate it burns a lot. Please call patient back, thanks.

## 2017-09-07 NOTE — Telephone Encounter (Signed)
Spoke to Rick [name]. He is not happy that he is still having issues with his urine. Discussed that his urine was normal on exam yesterday. Offered that he could go to an urgent care over the weekend if he is concerned about his urine or he can wait to see the urologist on Monday. Nothing I can do as the office is closed and we cannot recheck his urine at this time. He was not happy with this situation. Will see urology on Monday. Call with any concerns.

## 2017-09-10 ENCOUNTER — Ambulatory Visit: Payer: Medicare Other | Admitting: Urology

## 2017-09-10 ENCOUNTER — Encounter: Payer: Self-pay | Admitting: Urology

## 2017-09-10 VITALS — BP 154/70 | HR 85 | Ht 68.0 in | Wt 171.0 lb

## 2017-09-10 DIAGNOSIS — R300 Dysuria: Secondary | ICD-10-CM | POA: Diagnosis not present

## 2017-09-10 DIAGNOSIS — N401 Enlarged prostate with lower urinary tract symptoms: Secondary | ICD-10-CM

## 2017-09-10 DIAGNOSIS — R350 Frequency of micturition: Secondary | ICD-10-CM | POA: Diagnosis not present

## 2017-09-10 DIAGNOSIS — R3914 Feeling of incomplete bladder emptying: Secondary | ICD-10-CM | POA: Diagnosis not present

## 2017-09-10 LAB — URINALYSIS, COMPLETE
BILIRUBIN UA: NEGATIVE
Glucose, UA: NEGATIVE
KETONES UA: NEGATIVE
NITRITE UA: NEGATIVE
PH UA: 5 (ref 5.0–7.5)
Protein, UA: NEGATIVE
RBC UA: NEGATIVE
SPEC GRAV UA: 1.015 (ref 1.005–1.030)
UUROB: 0.2 mg/dL (ref 0.2–1.0)

## 2017-09-10 LAB — MICROSCOPIC EXAMINATION

## 2017-09-10 LAB — BLADDER SCAN AMB NON-IMAGING

## 2017-09-10 MED ORDER — SILDENAFIL CITRATE 20 MG PO TABS: ORAL_TABLET | ORAL | 0 refills | Status: DC

## 2017-09-10 NOTE — Progress Notes (Signed)
09/10/2017 12:38 PM   Rick [name] 16-Apr-1941 [id number]  Referring provider: [name], DO [place], [place] [address]  Chief Complaint  Patient presents with  . Dysuria    New Patient    HPI: Rick [name] is a 77 year old male seen for evaluation of lower urinary tract symptoms.  He has a history of urothelial carcinoma of the bladder diagnosed greater than 13 years ago.  He was treated with TURBT and also had a left distal ureterectomy with reimplant.  He also has a history of renal cell carcinoma of the left kidney treated with renal cryoablation.  He was previously followed by Dr. Jacqlyn Larsen who last saw him in 2014.  He had cystoscopy at that time which showed moderate lateral lobe enlargement and he was started on tamsulosin. He takes finasteride and tamsulosin "when I can remember".  He saw his primary provider on 1/24 complaining of dysuria and urinalysis was unremarkable.  He had a positive urine culture in early January and states he was treated with Cipro although the culture was resistant to fluoroquinolones.  Record review shows that he was switched to Augmentin.  His dysuria resolved.  He does complain of moderate lower urinary tract symptoms including frequency hesitancy and decreased force and caliber of his urinary.  He denies gross hematuria.  He also complains of difficulty achieving and maintaining an erection.  He has taken sildenafil in the past and is requesting a refill.   PMH: Past Medical History:  Diagnosis Date  . Anxiety   . Bipolar disorder ([place])   . Bladder cancer ([place])   . BPH (benign prostatic hypertrophy)   . Chest pain, atypical 06/23/2015  . Depression   . Diabetes mellitus without complication ([place])   . Difficult or painful urination 10/31/2012  . Dysrhythmia   . Ear problem 03/24/2015  . Gangrenous appendicitis   . H/O urinary disorder 03/27/2013  . Herpes genitalis in men   . Hyperlipidemia   . Hypertension   . Insomnia   .  Kidney stones   . Left hand pain 05/18/2015  . Mass of arm 02/10/2015  . Peripheral neuropathy   . Skin cancer   . UD (urethral discharge) 10/31/2012    Surgical History: Past Surgical History:  Procedure Laterality Date  . APPENDECTOMY     RUPTURED  . BLADDER SURGERY    . CHOLECYSTECTOMY    . COLOSTOMY     AND LATER CLOSURE  . FRACTURE SURGERY    . HERNIA REPAIR    . INNER EAR SURGERY Left   . ORIF ANKLE FRACTURE Left 09/27/2015   Procedure: OPEN REDUCTION INTERNAL FIXATION (ORIF) ANKLE FRACTURE;  Surgeon: Corky Mull, MD;  Location: [facility] ORS;  Service: Orthopedics;  Laterality: Left;  . SKIN CANCER EXCISION    . VENTRAL HERNIA REPAIR N/A 01/14/2016   Procedure: HERNIA REPAIR VENTRAL ADULT;  Surgeon: Leonie Green, MD;  Location: [facility] ORS;  Service: General;  Laterality: N/A;    Home Medications:  Allergies as of 09/10/2017      Reactions   Haloperidol    Other reaction(s): Unknown   Sulfa Antibiotics    Other reaction(s): OTHER      Medication List        Accurate as of 09/10/17 12:38 PM. Always use your most recent med list.          carvedilol 6.25 MG tablet Commonly known as:  COREG Take 1 tablet (6.25 mg total) by  mouth 2 (two) times daily with a meal.   diclofenac sodium 1 % Gel Commonly known as:  VOLTAREN Apply 2 g topically 4 (four) times daily.   enalapril 10 MG tablet Commonly known as:  VASOTEC Take 1 tablet (10 mg total) by mouth daily.   finasteride 5 MG tablet Commonly known as:  PROSCAR Take 1 tablet (5 mg total) by mouth daily.   fluticasone 50 MCG/ACT nasal spray Commonly known as:  FLONASE Place 2 sprays into both nostrils 2 (two) times daily.   gabapentin 300 MG capsule Commonly known as:  NEURONTIN Take 2 capsules (600 mg total) by mouth 2 (two) times daily.   glipiZIDE 2.5 MG 24 hr tablet Commonly known as:  GLUCOTROL XL TAKE 1 TABLET (2.5MG TOTAL) BY MOUTH DAILY WITH BREAKFAST   hydrochlorothiazide 25 MG tablet Commonly  known as:  HYDRODIURIL TAKE ONE (1) TABLET EACH DAY   Insulin Glargine 100 UNIT/ML Solostar Pen Commonly known as:  LANTUS SOLOSTAR Inject 15 Units into the skin daily at 10 pm.   lovastatin 40 MG tablet Commonly known as:  MEVACOR Take 1 tablet (40 mg total) by mouth daily.   metFORMIN 1000 MG tablet Commonly known as:  GLUCOPHAGE TAKE 1 TABLET BY MOUTH 2 TIMES DAILY WITH A MEAL   mirtazapine 15 MG tablet Commonly known as:  REMERON Take 1 tablet (15 mg total) by mouth at bedtime.   ONE TOUCH ULTRA SYSTEM KIT w/Device Kit 1 kit by Does not apply route once.   ONE TOUCH ULTRA TEST test strip Generic drug:  glucose blood TEST BLOOD SUGAR THREE TIMES DAILY.   onetouch ultrasoft lancets USE TO TEST BLOOD SUGAR TWICE A DAY AS DIRECTED   pseudoephedrine 30 MG tablet Commonly known as:  SUDAFED Take 1 tablet (30 mg total) by mouth every 6 (six) hours as needed for congestion.   sildenafil 100 MG tablet Commonly known as:  VIAGRA Take 0.5-1 tablets (50-100 mg total) by mouth daily as needed for erectile dysfunction.   traZODone 100 MG tablet Commonly known as:  DESYREL Take 1 tablet (100 mg total) by mouth at bedtime.   ULTICARE PEN NEEDLES 29G X 12MM Misc Generic drug:  Insulin Pen Needle USE AS DIRECTED   valACYclovir 1000 MG tablet Commonly known as:  VALTREX Take 1 tablet (1,000 mg total) by mouth daily as needed.       Allergies:  Allergies  Allergen Reactions  . Haloperidol     Other reaction(s): Unknown  . Sulfa Antibiotics     Other reaction(s): OTHER    Family History: Family History  Problem Relation Age of Onset  . Transient ischemic attack Mother   . Diabetes Father   . Cancer Brother     Social History:  reports that he quit smoking about 12 years ago. His smoking use included cigarettes. He started smoking about 23 years ago. He has a 22.50 pack-year smoking history. He quit smokeless tobacco use about 7 years ago. His smokeless tobacco use  included snuff and chew. He reports that he does not drink alcohol or use drugs.  ROS: UROLOGY Frequent Urination?: Yes Hard to postpone urination?: Yes Burning/pain with urination?: Yes Get up at night to urinate?: Yes Leakage of urine?: Yes Urine stream starts and stops?: Yes Trouble starting stream?: Yes Do you have to strain to urinate?: Yes Blood in urine?: No Urinary tract infection?: No Sexually transmitted disease?: No Injury to kidneys or bladder?: No Painful intercourse?: No Weak stream?: No  Erection problems?: Yes Penile pain?: No  Gastrointestinal Nausea?: No Vomiting?: No Indigestion/heartburn?: No Diarrhea?: No Constipation?: No  Constitutional Fever: No Night sweats?: No Weight loss?: No Fatigue?: No  Skin Skin rash/lesions?: No Itching?: No  Eyes Blurred vision?: No Double vision?: No  Ears/Nose/Throat Sore throat?: No Sinus problems?: No  Hematologic/Lymphatic Swollen glands?: No Easy bruising?: No  Cardiovascular Leg swelling?: No Chest pain?: No  Respiratory Cough?: No Shortness of breath?: No  Endocrine Excessive thirst?: No  Musculoskeletal Back pain?: No Joint pain?: No  Neurological Headaches?: No Dizziness?: No  Psychologic Depression?: No Anxiety?: No  Physical Exam: BP (!) 154/70   Pulse 85   Ht _0  (1.727 m)   Wt 171 lb (77.6 kg)   BMI 26.00 kg/m   Constitutional:  Alert and oriented, No acute distress. HEENT: [place] AT, moist mucus membranes.  Trachea midline, no masses. Cardiovascular: No clubbing, cyanosis, or edema. Respiratory: Normal respiratory effort, no increased work of breathing. GI: Abdomen is soft, nontender, nondistended, no abdominal masses GU: No CVA tenderness.  Prostate 60 g, smooth without nodules Skin: No rashes, bruises or suspicious lesions. Lymph: No cervical or inguinal adenopathy. Neurologic: Grossly intact, no focal deficits, moving all 4 extremities. Psychiatric: Normal mood  and affect.  Laboratory Data: Lab Results  Component Value Date   WBC 7.8 05/14/2017   HGB 13.5 05/14/2017   HCT 38.9 05/14/2017   MCV 83 05/14/2017   PLT 264 05/14/2017    Lab Results  Component Value Date   CREATININE 0.86 06/08/2017    Lab Results  Component Value Date   PSA1 1.6 08/20/2017    Lab Results  Component Value Date   HGBA1C 6.3 01/05/2016    Urinalysis Dipstick 1+ leukocytes/microscopy 11-30 WBC   Assessment & Plan:    1. Dysuria Urine culture was ordered.  Will await culture results prior to antibiotic therapy  - Urinalysis, Complete - BLADDER SCAN AMB NON-IMAGING - CULTURE, URINE COMPREHENSIVE  2. Benign prostatic hyperplasia with incomplete bladder emptying PVR by bladder scan was 313 mL.  He refused catheter placement.  Stressed the need to take tamsulosin and finasteride regularly.  3.  Personal history of bladder cancer He refused surveillance cystoscopy stating he was willing to accept the risks of undiagnosed recurrent urothelial cancer.  4.  History of renal cell carcinoma Status post cryoablation approximately 12 years ago.  He refused follow-up imaging stating he was willing to accept the risks of recurrent renal cell carcinoma.   Return if symptoms worsen or fail to improve.  Abbie Sons, [place] [address], [place], [place] [address] [phone]

## 2017-09-13 ENCOUNTER — Other Ambulatory Visit: Payer: Self-pay | Admitting: Urology

## 2017-09-13 LAB — CULTURE, URINE COMPREHENSIVE

## 2017-09-13 MED ORDER — AMOXICILLIN-POT CLAVULANATE 875-125 MG PO TABS: 1.0000 | ORAL_TABLET | Freq: Two times a day (BID) | ORAL | 0 refills | Status: DC

## 2017-09-14 ENCOUNTER — Telehealth: Payer: Self-pay

## 2017-09-14 NOTE — Telephone Encounter (Signed)
Sharyn Lull spoke with pt who stated he picked up his medication and has started it.

## 2017-09-14 NOTE — Telephone Encounter (Signed)
No answer

## 2017-09-14 NOTE — Telephone Encounter (Signed)
-----   Message from Abbie Sons, MD sent at 09/13/2017  2:04 PM EST ----- Urine culture was positive.  Antibiotic Rx was sent to pharmacy.

## 2017-09-24 ENCOUNTER — Encounter: Payer: Self-pay | Admitting: Family Medicine

## 2017-09-24 ENCOUNTER — Ambulatory Visit (INDEPENDENT_AMBULATORY_CARE_PROVIDER_SITE_OTHER): Payer: Medicare Other | Admitting: Family Medicine

## 2017-09-24 ENCOUNTER — Telehealth: Payer: Self-pay | Admitting: Urology

## 2017-09-24 VITALS — BP 124/78 | HR 84 | Temp 97.9°F | Wt 170.0 lb

## 2017-09-24 DIAGNOSIS — C679 Malignant neoplasm of bladder, unspecified: Secondary | ICD-10-CM

## 2017-09-24 DIAGNOSIS — R3914 Feeling of incomplete bladder emptying: Secondary | ICD-10-CM | POA: Diagnosis not present

## 2017-09-24 DIAGNOSIS — R300 Dysuria: Secondary | ICD-10-CM

## 2017-09-24 DIAGNOSIS — N401 Enlarged prostate with lower urinary tract symptoms: Secondary | ICD-10-CM

## 2017-09-24 DIAGNOSIS — N3001 Acute cystitis with hematuria: Secondary | ICD-10-CM

## 2017-09-24 MED ORDER — NITROFURANTOIN MONOHYD MACRO 100 MG PO CAPS: 100.0000 mg | ORAL_CAPSULE | Freq: Two times a day (BID) | ORAL | 0 refills | Status: DC

## 2017-09-24 MED ORDER — TAMSULOSIN HCL 0.4 MG PO CAPS: 0.4000 mg | ORAL_CAPSULE | Freq: Every day | ORAL | 3 refills | Status: DC

## 2017-09-24 NOTE — Assessment & Plan Note (Signed)
Discussed that until he gets his urine to drain from bladder, he will continue to get UTIs. He doesn't like finasteride, as feels like it makes him tired, Will try to make sure that he will take his flomax and finasteride daily. Will follow up with urology for ? More definitive treatment as he is not satisfied with current treatment.

## 2017-09-24 NOTE — Assessment & Plan Note (Signed)
Discussed that we cannot know for sure what's causing his urinary retention without a cystoscopy. He states that he understands and will go see Dr. Bernardo Heater for a cysto- call made to them.

## 2017-09-24 NOTE — Progress Notes (Signed)
BP 124/78 (BP Location: Left Arm, Patient Position: Sitting, Cuff Size: Normal)   Pulse 84   Temp 97.9 F (36.6 C) (Temporal)   Wt 170 lb (77.1 kg)   SpO2 97%   BMI 25.85 kg/m    Subjective:    Patient ID: Rick [name], male    DOB: 25-Feb-1941, 77 y.o.   MRN: [id number]  HPI: Rick [name] is a 77 y.o. male  Chief Complaint  Patient presents with  . Urinary Tract Infection   URINARY SYMPTOMS- saw Dr. Bernardo Heater at the end of January, had a UA and was diagnosed with a UTI. Started on augmentin on 09/14/17 for 7 days. He just finished his antibiotics on Friday. Duration: weeks Dysuria: yes Urinary frequency: yes Urgency: yes Small volume voids: no Symptom severity: no Urinary incontinence: no Foul odor: yes Hematuria: no Abdominal pain: no Back pain: no Suprapubic pain/pressure: no Flank pain: no Fever:  no Vomiting: no Relief with cranberry juice: no Relief with pyridium: no Status: stable Previous urinary tract infection: yes Recurrent urinary tract infection: yes History of sexually transmitted disease: no Penile discharge: no Treatments attempted: antibiotics, pyridium, cranberry and increasing fluids    Relevant past medical, surgical, family and social history reviewed and updated as indicated. Interim medical history since our last visit reviewed. Allergies and medications reviewed and updated.  Review of Systems  Constitutional: Negative.   Respiratory: Negative.   Cardiovascular: Negative.   Genitourinary: Positive for difficulty urinating, dysuria, frequency and urgency. Negative for decreased urine volume, discharge, enuresis, flank pain, genital sores, hematuria, penile pain, penile swelling, scrotal swelling and testicular pain.  Neurological: Negative.   Psychiatric/Behavioral: Negative.     Per HPI unless specifically indicated above     Objective:    BP 124/78 (BP Location: Left Arm, Patient Position: Sitting, Cuff Size: Normal)   Pulse  84   Temp 97.9 F (36.6 C) (Temporal)   Wt 170 lb (77.1 kg)   SpO2 97%   BMI 25.85 kg/m   Wt Readings from Last 3 Encounters:  09/24/17 170 lb (77.1 kg)  09/10/17 171 lb (77.6 kg)  09/06/17 171 lb 3 oz (77.7 kg)    Physical Exam  Constitutional: He is oriented to person, place, and time. He appears well-developed and well-nourished. No distress.  HENT:  Head: Normocephalic and atraumatic.  Right Ear: Hearing normal.  Left Ear: Hearing normal.  Nose: Nose normal.  Eyes: Conjunctivae and lids are normal. Right eye exhibits no discharge. Left eye exhibits no discharge. No scleral icterus.  Cardiovascular: Normal rate, regular rhythm, normal heart sounds and intact distal pulses. Exam reveals no gallop and no friction rub.  No murmur heard. Pulmonary/Chest: Effort normal. No respiratory distress. He has no wheezes. He has no rales. He exhibits no tenderness.  Musculoskeletal: Normal range of motion.  Neurological: He is alert and oriented to person, place, and time.  Skin: Skin is warm, dry and intact. No rash noted. He is not diaphoretic. No erythema. No pallor.  Psychiatric: He has a normal mood and affect. His speech is normal and behavior is normal. Judgment and thought content normal. Cognition and memory are normal.    Results for orders placed or performed in visit on 09/10/17  CULTURE, URINE COMPREHENSIVE  Result Value Ref Range   Urine Culture, Comprehensive Final report (A)    Organism ID, Bacteria Escherichia coli (A)    ANTIMICROBIAL SUSCEPTIBILITY Comment   Microscopic Examination  Result Value Ref Range   WBC,  UA 11-30 (A) 0 - 5 /hpf   RBC, UA 0-2 0 - 2 /hpf   Epithelial Cells (non renal) 0-10 0 - 10 /hpf   Renal Epithel, UA 0-10 None seen /hpf   Bacteria, UA Few (A) None seen/Few  Urinalysis, Complete  Result Value Ref Range   Specific Gravity, UA 1.015 1.005 - 1.030   pH, UA 5.0 5.0 - 7.5   Color, UA Yellow Yellow   Appearance Ur Clear Clear   Leukocytes,  UA 1+ (A) Negative   Protein, UA Negative Negative/Trace   Glucose, UA Negative Negative   Ketones, UA Negative Negative   RBC, UA Negative Negative   Bilirubin, UA Negative Negative   Urobilinogen, Ur 0.2 0.2 - 1.0 mg/dL   Nitrite, UA Negative Negative   Microscopic Examination See below:   BLADDER SCAN AMB NON-IMAGING  Result Value Ref Range   Scan Result 371ml       Assessment & Plan:   Problem List Items Addressed This Visit      Genitourinary   Benign prostatic hyperplasia with incomplete bladder emptying    Discussed that until he gets his urine to drain from bladder, he will continue to get UTIs. He doesn't like finasteride, as feels like it makes him tired, Will try to make sure that he will take his flomax and finasteride daily. Will follow up with urology for ? More definitive treatment as he is not satisfied with current treatment.       Relevant Medications   tamsulosin (FLOMAX) 0.4 MG CAPS capsule   nitrofurantoin, macrocrystal-monohydrate, (MACROBID) 100 MG capsule   Malignant neoplasm of urinary bladder ([facility])    Discussed that we cannot know for sure what's causing his urinary retention without a cystoscopy. He states that he understands and will go see Dr. Bernardo Heater for a cysto- call made to them.        Other Visit Diagnoses    Acute cystitis with hematuria    -  Primary   Will treat with nitrofurantoin based on last culture. Await culture. Call with any concerns. Follow up with urology- call made into them.    Dysuria       +UA   Relevant Orders   UA/M w/rflx Culture, Routine       Follow up plan: Return As scheduled, will see him earlier if needed.

## 2017-09-24 NOTE — Telephone Encounter (Signed)
Patient's PCP office called and stated that the patient is now stating that he would like to proceed with the cysto that you had suggested at this last app. Please advise and let me know what you would like the patient to have.  Sharyn Lull

## 2017-09-26 NOTE — Telephone Encounter (Signed)
Okay to schedule cystoscopy

## 2017-09-27 ENCOUNTER — Telehealth: Payer: Self-pay | Admitting: Family Medicine

## 2017-09-27 LAB — MICROSCOPIC EXAMINATION

## 2017-09-27 LAB — URINE CULTURE, REFLEX

## 2017-09-27 LAB — UA/M W/RFLX CULTURE, ROUTINE
BILIRUBIN UA: NEGATIVE
Glucose, UA: NEGATIVE
KETONES UA: NEGATIVE
NITRITE UA: POSITIVE — AB
Specific Gravity, UA: 1.01 (ref 1.005–1.030)
UUROB: 0.2 mg/dL (ref 0.2–1.0)
pH, UA: 5 (ref 5.0–7.5)

## 2017-09-27 NOTE — Telephone Encounter (Signed)
Patient notified

## 2017-09-27 NOTE — Telephone Encounter (Signed)
Please let him know that his urine should be getting better with his antibiotic as the bacteria should die with that antibiotic. Please let me know if he's not feeling better and if he has not heard from the urologist yet, they are goinf to be calling him to set up that cystoscopy to make sure there's no cancer in his bladder. Thanks!

## 2017-09-28 DIAGNOSIS — E119 Type 2 diabetes mellitus without complications: Secondary | ICD-10-CM | POA: Diagnosis not present

## 2017-09-28 LAB — HM DIABETES EYE EXAM

## 2017-10-10 ENCOUNTER — Encounter: Payer: Self-pay | Admitting: Urology

## 2017-10-10 ENCOUNTER — Ambulatory Visit: Payer: Medicare Other | Admitting: Urology

## 2017-10-10 VITALS — BP 149/77 | HR 103 | Ht 68.0 in | Wt 170.0 lb

## 2017-10-10 DIAGNOSIS — C679 Malignant neoplasm of bladder, unspecified: Secondary | ICD-10-CM

## 2017-10-10 MED ORDER — LIDOCAINE HCL 2 % EX GEL
1.0000 "application " | Freq: Once | CUTANEOUS | Status: AC
  Administered 2017-10-10: 1 via URETHRAL

## 2017-10-10 NOTE — Progress Notes (Signed)
77 year old male seen on 09/10/2017 for UTI and history of bladder cancer.  He called back and elected to schedule cystoscopy which he had initially refused.  Urine culture at that visit did grow E. coli.  His urinalysis today is nitrite positive with 3+ leukocytes.  There were greater than 30 WBCs on microscopy.  His cystoscopy was canceled.  A urine culture was ordered and he will be rescheduled after starting antibiotics.  Will keep on low-dose suppression until his cystoscopy is performed.

## 2017-10-10 NOTE — Addendum Note (Signed)
Addended by: Donalee Citrin on: 10/10/2017 01:37 PM   Modules accepted: Orders

## 2017-10-11 ENCOUNTER — Encounter: Payer: Self-pay | Admitting: Family Medicine

## 2017-10-11 ENCOUNTER — Ambulatory Visit (INDEPENDENT_AMBULATORY_CARE_PROVIDER_SITE_OTHER): Payer: Medicare Other | Admitting: Family Medicine

## 2017-10-11 VITALS — BP 140/76 | HR 75 | Temp 98.5°F | Wt 170.3 lb

## 2017-10-11 DIAGNOSIS — H6981 Other specified disorders of Eustachian tube, right ear: Secondary | ICD-10-CM

## 2017-10-11 DIAGNOSIS — E1142 Type 2 diabetes mellitus with diabetic polyneuropathy: Secondary | ICD-10-CM

## 2017-10-11 DIAGNOSIS — R3914 Feeling of incomplete bladder emptying: Secondary | ICD-10-CM | POA: Diagnosis not present

## 2017-10-11 DIAGNOSIS — N401 Enlarged prostate with lower urinary tract symptoms: Secondary | ICD-10-CM | POA: Diagnosis not present

## 2017-10-11 DIAGNOSIS — H6122 Impacted cerumen, left ear: Secondary | ICD-10-CM | POA: Diagnosis not present

## 2017-10-11 LAB — URINALYSIS, COMPLETE
Bilirubin, UA: NEGATIVE
GLUCOSE, UA: NEGATIVE
Ketones, UA: NEGATIVE
Nitrite, UA: POSITIVE — AB
PH UA: 5 (ref 5.0–7.5)
Specific Gravity, UA: 1.025 (ref 1.005–1.030)
UUROB: 0.2 mg/dL (ref 0.2–1.0)

## 2017-10-11 LAB — MICROSCOPIC EXAMINATION: WBC, UA: >30 /hpf — ABNORMAL HIGH (ref 0–?)

## 2017-10-11 MED ORDER — PREDNISONE 50 MG PO TABS: 50.0000 mg | ORAL_TABLET | Freq: Every day | ORAL | 0 refills | Status: DC

## 2017-10-11 NOTE — Assessment & Plan Note (Signed)
Continue to follow with urology. Await culture. Call with any concerns.

## 2017-10-11 NOTE — Progress Notes (Signed)
BP 140/76 (BP Location: Left Arm, Patient Position: Sitting, Cuff Size: Normal)   Pulse 75   Temp 98.5 F (36.9 C)   Wt 170 lb 5 oz (77.3 kg)   SpO2 99%   BMI 25.90 kg/m    Subjective:    Patient ID: Rick [name], male    DOB: [birth year], 77 y.o.   MRN: [id number]  HPI: Rick [name] is a 77 y.o. male  Chief Complaint  Patient presents with  . Ear Pain   EAR PAIN- never saw the ear doctor. Missed appointment.  Duration: chronic, has been hurting for about 4-5 days Involved ear(s): right Severity:  moderate  Quality:  throbbing Fever: no Otorrhea: no Upper respiratory infection symptoms: no Pruritus: no Hearing loss: no Water immersion no Using Q-tips: no Recurrent otitis media: yes Status: worse Treatments attempted: none   Seeing Dr. Bernardo Heater for his recurrent UTIs- just started on preventive antibiotics. To have cysto done when he does not have a UTI.   Has been worried about his dog, has a bad back notes that his sugars are changing. Has been taking his glipizide more often than he is supposed to. He is otherwise feeling well with no other concerns or complaints at this time.   Relevant past medical, surgical, family and social history reviewed and updated as indicated. Interim medical history since our last visit reviewed. Allergies and medications reviewed and updated.  Review of Systems  Constitutional: Negative.   HENT: Positive for congestion and ear pain. Negative for dental problem, drooling, ear discharge, facial swelling, hearing loss, mouth sores, nosebleeds, postnasal drip, rhinorrhea, sinus pressure, sinus pain, sneezing, sore throat, tinnitus, trouble swallowing and voice change.   Respiratory: Negative.   Cardiovascular: Negative.   Psychiatric/Behavioral: Negative.     Per HPI unless specifically indicated above     Objective:    BP 140/76 (BP Location: Left Arm, Patient Position: Sitting, Cuff Size: Normal)   Pulse 75   Temp 98.5 F  (36.9 C)   Wt 170 lb 5 oz (77.3 kg)   SpO2 99%   BMI 25.90 kg/m   Wt Readings from Last 3 Encounters:  10/11/17 170 lb 5 oz (77.3 kg)  10/10/17 170 lb (77.1 kg)  09/24/17 170 lb (77.1 kg)    Physical Exam  Constitutional: He is oriented to person, place, and time. He appears well-developed and well-nourished. No distress.  HENT:  Head: Normocephalic and atraumatic.  Right Ear: Hearing normal.  Left Ear: Hearing normal.  Nose: Nose normal.  Mouth/Throat: Oropharynx is clear and moist. No oropharyngeal exudate.  L cerumen impaction with hard, dry cerumen R TM dark, not bulging or red, small amount of cerumen in the canal.  Eyes: Conjunctivae, EOM and lids are normal. Pupils are equal, round, and reactive to light. Right eye exhibits no discharge. Left eye exhibits no discharge. No scleral icterus.  Neck: Normal range of motion. Neck supple. No JVD present. No tracheal deviation present. No thyromegaly present.  Cardiovascular: Normal rate, regular rhythm, normal heart sounds and intact distal pulses. Exam reveals no gallop and no friction rub.  No murmur heard. Pulmonary/Chest: Effort normal and breath sounds normal. No stridor. No respiratory distress. He has no wheezes. He has no rales. He exhibits no tenderness.  Musculoskeletal: Normal range of motion.  Lymphadenopathy:    He has no cervical adenopathy.  Neurological: He is alert and oriented to person, place, and time.  Skin: Skin is warm, dry and intact. No rash  noted. He is not diaphoretic. No erythema. No pallor.  Psychiatric: He has a normal mood and affect. His speech is normal and behavior is normal. Judgment and thought content normal. Cognition and memory are normal.  Nursing note and vitals reviewed.   Results for orders placed or performed in visit on 10/10/17  Microscopic Examination  Result Value Ref Range   WBC, UA >30 (H) 0 - 5 /hpf   RBC, UA 3-10 (A) 0 - 2 /hpf   Epithelial Cells (non renal) 0-10 0 - 10 /hpf     Mucus, UA Present (A) Not Estab.   Bacteria, UA Many (A) None seen/Few  Urinalysis, Complete  Result Value Ref Range   Specific Gravity, UA 1.025 1.005 - 1.030   pH, UA 5.0 5.0 - 7.5   Color, UA Yellow Yellow   Appearance Ur Cloudy (A) Clear   Leukocytes, UA 3+ (A) Negative   Protein, UA 3+ (A) Negative/Trace   Glucose, UA Negative Negative   Ketones, UA Negative Negative   RBC, UA 1+ (A) Negative   Bilirubin, UA Negative Negative   Urobilinogen, Ur 0.2 0.2 - 1.0 mg/dL   Nitrite, UA Positive (A) Negative   Microscopic Examination See below:       Assessment & Plan:   Problem List Items Addressed This Visit      Endocrine   Diabetic peripheral neuropathy associated with type 2 diabetes mellitus ([place])    Advised him not to take more of his glipizide than he is supposed to due to the risk of hypoglycemia. Continue to monitor.         Genitourinary   Benign prostatic hyperplasia with incomplete bladder emptying    Continue to follow with urology. Await culture. Call with any concerns.        Other Visit Diagnoses    Dysfunction of right eustachian tube    -  Primary   Will treat with prednisone. Long-term issue, if not better, will get him back into ENT.   Relevant Medications   predniSONE (DELTASONE) 50 MG tablet   Impacted cerumen of left ear       Will start using debrox to soften wax and try to remove in about 2 weeks.        Follow up plan: Return in about 2 weeks (around 10/25/2017) for recheck ears.

## 2017-10-11 NOTE — Assessment & Plan Note (Signed)
Advised him not to take more of his glipizide than he is supposed to due to the risk of hypoglycemia. Continue to monitor.

## 2017-10-11 NOTE — Patient Instructions (Addendum)
Use the ear wax remover for 2 weeks, then come back in and we'll flush your ear.   Take the prednisone- it will make your sugars go up, so don't take your sugars as often, because we know it's going to be high, and go back down after. Take it in the AM because it may keep you up at night and might make it hard for you to sleep. If your ear doesn't feel better with this, we'll get you back in to see Dr. Richardson Landry  Dr. Elenor Quinones is going to call you when he culture results back in a couple of days and he's going to keep you on antibiotics until you have your cystoscopy.

## 2017-10-12 LAB — CULTURE, URINE COMPREHENSIVE

## 2017-10-15 ENCOUNTER — Telehealth: Payer: Self-pay

## 2017-10-15 MED ORDER — AMOXICILLIN-POT CLAVULANATE 875-125 MG PO TABS: 1.0000 | ORAL_TABLET | Freq: Two times a day (BID) | ORAL | 0 refills | Status: AC

## 2017-10-15 NOTE — Telephone Encounter (Signed)
-----   Message from Abbie Sons, MD sent at 10/14/2017 11:22 AM EST ----- Urine culture was positive.  Please send an Rx Augmentin 875/125 twice daily for 10 days.  Would then recommend starting nitrofurantoin after he completes this course at 50 mg daily until his cystoscopy is performed.  Please reschedule cystoscopy.

## 2017-10-15 NOTE — Telephone Encounter (Signed)
Spoke with pt in reference to augmentin and macrobid. Made pt aware will need to take augmentin x10 days. Was able to reschedule cysto within the 10 days of augmentin therefore no macrobid was sent to pharmacy. Pt voiced understanding.

## 2017-10-24 ENCOUNTER — Encounter: Payer: Self-pay | Admitting: Family Medicine

## 2017-10-24 ENCOUNTER — Other Ambulatory Visit: Payer: Self-pay | Admitting: Family Medicine

## 2017-10-24 ENCOUNTER — Telehealth: Payer: Self-pay | Admitting: Family Medicine

## 2017-10-24 ENCOUNTER — Ambulatory Visit (INDEPENDENT_AMBULATORY_CARE_PROVIDER_SITE_OTHER): Payer: Medicare Other | Admitting: Family Medicine

## 2017-10-24 VITALS — BP 138/80 | HR 83 | Temp 99.0°F | Wt 172.2 lb

## 2017-10-24 DIAGNOSIS — H6123 Impacted cerumen, bilateral: Secondary | ICD-10-CM | POA: Diagnosis not present

## 2017-10-24 NOTE — Telephone Encounter (Signed)
Referral submitted to [facility].

## 2017-10-24 NOTE — Telephone Encounter (Signed)
Message relayed to patient.

## 2017-10-24 NOTE — Progress Notes (Signed)
BP 138/80   Pulse 83   Temp 99 F (37.2 C) (Oral)   Wt 172 lb 3.2 oz (78.1 kg)   SpO2 98%   BMI 26.18 kg/m    Subjective:    Patient ID: Rick [name], male    DOB: 02-Dec-1940, 77 y.o.   MRN: [id number]  HPI: Rick [name] is a 77 y.o. male  Chief Complaint  Patient presents with  . Cerumen Impaction    2 week f/up   EAG CLOGGED Duration: weeks Involved ear(s):  bilateral Sensation of feeling clogged/plugged: yes Decreased/muffled hearing:yes Ear pain: no Fever: no Otorrhea: no Hearing loss: yes Upper respiratory infection symptoms: no Using Q-Tips: no Status: better History of cerumenosis: yes Treatments attempted: none  Relevant past medical, surgical, family and social history reviewed and updated as indicated. Interim medical history since our last visit reviewed. Allergies and medications reviewed and updated.  Review of Systems  Constitutional: Negative.   HENT: Positive for ear discharge and hearing loss. Negative for congestion, dental problem, drooling, ear pain, facial swelling, mouth sores, nosebleeds, postnasal drip, rhinorrhea, sinus pressure, sinus pain, sneezing, sore throat, tinnitus, trouble swallowing and voice change.   Eyes: Negative.   Respiratory: Negative.   Cardiovascular: Negative.   Psychiatric/Behavioral: Negative.     Per HPI unless specifically indicated above     Objective:    BP 138/80   Pulse 83   Temp 99 F (37.2 C) (Oral)   Wt 172 lb 3.2 oz (78.1 kg)   SpO2 98%   BMI 26.18 kg/m   Wt Readings from Last 3 Encounters:  10/24/17 172 lb 3.2 oz (78.1 kg)  10/11/17 170 lb 5 oz (77.3 kg)  10/10/17 170 lb (77.1 kg)    Physical Exam  Constitutional: He is oriented to person, place, and time. He appears well-developed and well-nourished. No distress.  HENT:  Head: Normocephalic and atraumatic.  Right Ear: Hearing normal.  Left Ear: Hearing normal.  Nose: Nose normal.  Mouth/Throat: Oropharynx is clear and moist. No  oropharyngeal exudate.  Cerumen impaction bilaterally  Eyes: Conjunctivae, EOM and lids are normal. Pupils are equal, round, and reactive to light. Right eye exhibits no discharge. Left eye exhibits no discharge. No scleral icterus.  Cardiovascular: Normal rate, regular rhythm, normal heart sounds and intact distal pulses. Exam reveals no gallop and no friction rub.  No murmur heard. Pulmonary/Chest: Effort normal and breath sounds normal. No respiratory distress. He has no wheezes. He has no rales. He exhibits no tenderness.  Musculoskeletal: Normal range of motion.  Neurological: He is alert and oriented to person, place, and time.  Skin: Skin is intact. No rash noted. He is not diaphoretic.  Psychiatric: He has a normal mood and affect. His speech is normal and behavior is normal. Judgment and thought content normal. Cognition and memory are normal.  Nursing note and vitals reviewed.   Results for orders placed or performed in visit on 10/10/17  CULTURE, URINE COMPREHENSIVE  Result Value Ref Range   Urine Culture, Comprehensive Final report (A)    Organism ID, Bacteria Escherichia coli (A)    ANTIMICROBIAL SUSCEPTIBILITY Comment   Microscopic Examination  Result Value Ref Range   WBC, UA >30 (H) 0 - 5 /hpf   RBC, UA 3-10 (A) 0 - 2 /hpf   Epithelial Cells (non renal) 0-10 0 - 10 /hpf   Mucus, UA Present (A) Not Estab.   Bacteria, UA Many (A) None seen/Few  Urinalysis, Complete  Result  Value Ref Range   Specific Gravity, UA 1.025 1.005 - 1.030   pH, UA 5.0 5.0 - 7.5   Color, UA Yellow Yellow   Appearance Ur Cloudy (A) Clear   Leukocytes, UA 3+ (A) Negative   Protein, UA 3+ (A) Negative/Trace   Glucose, UA Negative Negative   Ketones, UA Negative Negative   RBC, UA 1+ (A) Negative   Bilirubin, UA Negative Negative   Urobilinogen, Ur 0.2 0.2 - 1.0 mg/dL   Nitrite, UA Positive (A) Negative   Microscopic Examination See below:       Assessment & Plan:   Problem List Items  Addressed This Visit    None    Visit Diagnoses    Bilateral impacted cerumen    -  Primary   Attempted to flush ears today. It was quite uncomfortable for patient and he was unable to tolerate it. Will get him into ENT for ear flush.       Follow up plan: Return As scheduled.

## 2017-10-24 NOTE — Telephone Encounter (Signed)
-----   Message from Amada Kingfisher, [place] sent at 10/24/2017 11:47 AM EDT ----- Called Dr. Reola Mosher office ([facility] [phone]) They stated he would be a new patient. Was seen in 2013. Scheduled for October of 2018 but N/S. They would not be able to get him in until April. Please advise.    ----- Message ----- From: [name], DO Sent: 10/24/2017  11:34 AM To: Amada Kingfisher, CMA  Can we please get Rick [name] back into Dr. Richardson Landry to get his ears flushed- we attempted to flush them in the office and he was not able to tolerate the procedure.

## 2017-10-24 NOTE — Telephone Encounter (Signed)
I've put a new referral in for him. I think April is OK- hopefully it's the beginning of April and not the end. Please tell Rick [name] to keep using his debrox and come back in if he can't hear again. Thanks!

## 2017-10-25 ENCOUNTER — Ambulatory Visit (INDEPENDENT_AMBULATORY_CARE_PROVIDER_SITE_OTHER): Payer: Medicare Other

## 2017-10-25 ENCOUNTER — Ambulatory Visit: Payer: Medicare Other | Admitting: Family Medicine

## 2017-10-25 ENCOUNTER — Other Ambulatory Visit: Payer: Self-pay | Admitting: Urology

## 2017-10-25 VITALS — BP 123/78 | HR 86 | Ht 68.0 in | Wt 170.0 lb

## 2017-10-25 DIAGNOSIS — N390 Urinary tract infection, site not specified: Secondary | ICD-10-CM

## 2017-10-25 LAB — URINALYSIS, COMPLETE
BILIRUBIN UA: NEGATIVE
Glucose, UA: NEGATIVE
Ketones, UA: NEGATIVE
Nitrite, UA: NEGATIVE
PH UA: 6 (ref 5.0–7.5)
Protein, UA: NEGATIVE
RBC UA: NEGATIVE
Specific Gravity, UA: 1.01 (ref 1.005–1.030)
Urobilinogen, Ur: 0.2 mg/dL (ref 0.2–1.0)

## 2017-10-25 LAB — MICROSCOPIC EXAMINATION
Bacteria, UA: NONE SEEN
RBC, UA: NONE SEEN /hpf (ref 0–?)

## 2017-10-25 NOTE — Progress Notes (Signed)
Pt presents today with c/o urinary frequency, urgency, and dysuria. Pt has been on augmentin for the past 10 days for a UTI.  A clean catch was obtained for u/a and cx.   Blood pressure 123/78, pulse 86, height 5\' 8"  (1.727 m), weight 170 lb (77.1 kg).

## 2017-10-29 LAB — CULTURE, URINE COMPREHENSIVE

## 2017-10-31 ENCOUNTER — Telehealth: Payer: Self-pay | Admitting: Urology

## 2017-10-31 ENCOUNTER — Telehealth: Payer: Self-pay

## 2017-10-31 NOTE — Telephone Encounter (Signed)
Pt called to find out urine culture results.

## 2017-10-31 NOTE — Telephone Encounter (Signed)
-----   Message from Abbie Sons, MD sent at 10/30/2017  8:34 AM EDT ----- Urine culture was negative for infection

## 2017-10-31 NOTE — Telephone Encounter (Signed)
Letter sent.

## 2017-11-02 ENCOUNTER — Ambulatory Visit (INDEPENDENT_AMBULATORY_CARE_PROVIDER_SITE_OTHER): Payer: Medicare Other | Admitting: Urology

## 2017-11-02 DIAGNOSIS — N390 Urinary tract infection, site not specified: Secondary | ICD-10-CM | POA: Diagnosis not present

## 2017-11-02 LAB — URINALYSIS, COMPLETE
Bilirubin, UA: NEGATIVE
Glucose, UA: NEGATIVE
Ketones, UA: NEGATIVE
NITRITE UA: POSITIVE — AB
Specific Gravity, UA: 1.015 (ref 1.005–1.030)
UUROB: 0.2 mg/dL (ref 0.2–1.0)
pH, UA: 6 (ref 5.0–7.5)

## 2017-11-02 LAB — MICROSCOPIC EXAMINATION

## 2017-11-02 NOTE — Progress Notes (Signed)
Patient was scheduled for cystoscopy today.  Although his urine culture last week was negative urinalysis today is nitrite positive with pyuria.  Repeat urine culture was ordered.  The cystoscopy will need to be postponed.

## 2017-11-05 ENCOUNTER — Telehealth: Payer: Self-pay

## 2017-11-05 ENCOUNTER — Other Ambulatory Visit: Payer: Self-pay | Admitting: Urology

## 2017-11-05 LAB — CULTURE, URINE COMPREHENSIVE

## 2017-11-05 MED ORDER — AMOXICILLIN-POT CLAVULANATE 875-125 MG PO TABS: 1.0000 | ORAL_TABLET | Freq: Two times a day (BID) | ORAL | 0 refills | Status: AC

## 2017-11-05 NOTE — Telephone Encounter (Signed)
No answer

## 2017-11-05 NOTE — Telephone Encounter (Signed)
Spoke with pt in reference to abx. Pt stated that he did not have any questions about the abx but wanted to make Dr. Bernardo Heater aware that [employer] dx him with bladder cancer.

## 2017-11-05 NOTE — Telephone Encounter (Signed)
Pt returned call, has questions about the antibiotic that was sent in for him.  Please call and advise pt. Thanks.

## 2017-11-05 NOTE — Telephone Encounter (Signed)
-----   Message from Abbie Sons, MD sent at 11/05/2017  7:20 AM EDT ----- Urine culture was positive.  Antibiotic Rx was sent to pharmacy.

## 2017-11-09 ENCOUNTER — Encounter: Payer: Self-pay | Admitting: Urology

## 2017-11-09 ENCOUNTER — Ambulatory Visit (INDEPENDENT_AMBULATORY_CARE_PROVIDER_SITE_OTHER): Payer: Medicare Other | Admitting: Urology

## 2017-11-09 DIAGNOSIS — N390 Urinary tract infection, site not specified: Secondary | ICD-10-CM | POA: Diagnosis not present

## 2017-11-09 DIAGNOSIS — C679 Malignant neoplasm of bladder, unspecified: Secondary | ICD-10-CM | POA: Diagnosis not present

## 2017-11-09 DIAGNOSIS — Z8551 Personal history of malignant neoplasm of bladder: Secondary | ICD-10-CM

## 2017-11-09 LAB — URINALYSIS, COMPLETE
Bilirubin, UA: NEGATIVE
GLUCOSE, UA: NEGATIVE
Ketones, UA: NEGATIVE
Nitrite, UA: NEGATIVE
PROTEIN UA: NEGATIVE
RBC, UA: NEGATIVE
Specific Gravity, UA: 1.01 (ref 1.005–1.030)
Urobilinogen, Ur: 0.2 mg/dL (ref 0.2–1.0)
pH, UA: 5.5 (ref 5.0–7.5)

## 2017-11-09 LAB — MICROSCOPIC EXAMINATION
Bacteria, UA: NONE SEEN
RBC, UA: NONE SEEN /hpf (ref 0–2)

## 2017-11-09 MED ORDER — TAMSULOSIN HCL 0.4 MG PO CAPS: 0.8000 mg | ORAL_CAPSULE | Freq: Every day | ORAL | 2 refills | Status: DC

## 2017-11-09 MED ORDER — CIPROFLOXACIN HCL 500 MG PO TABS: 500.0000 mg | ORAL_TABLET | Freq: Once | ORAL | Status: DC

## 2017-11-09 MED ORDER — LIDOCAINE HCL 2 % EX GEL: 1.0000 "application " | Freq: Once | CUTANEOUS | Status: DC

## 2017-11-09 NOTE — Progress Notes (Signed)
   11/09/17  CC:  Chief Complaint  Patient presents with  . Cysto    HPI: 77 year old male with history of urothelial carcinoma and recurrent UTI   There were no vitals taken for this visit. NED. A&Ox3.   No respiratory distress   Abd soft, NT, ND Normal phallus with bilateral descended testicles  Cystoscopy Procedure Note  Patient identification was confirmed, informed consent was obtained, and patient was prepped using Betadine solution.  Lidocaine jelly was administered per urethral meatus.    Preoperative abx where received prior to procedure.     Pre-Procedure: - Inspection reveals a normal caliber ureteral meatus.  Procedure: The flexible cystoscope was introduced without difficulty - No urethral strictures/lesions are present. - Mild lateral lobe enlargement prostate  - No significant bladder neck elevation - Bilateral ureteral orifices identified - Bladder mucosa  reveals no ulcers, tumors, or lesions - No bladder stones -Mild trabeculation  Retroflexion shows no abnormalities or intravesical median lobe   Post-Procedure: - Patient tolerated the procedure well  Assessment/ Plan: No evidence of recurrent bladder tumor.  Only mild prostate enlargement.  He does not appear to be adequately emptying his bladder.  Will increase his tamsulosin to 0.8 mg.  Follow-up bladder scan/nurse visit in 6 weeks.

## 2017-11-19 DIAGNOSIS — H7012 Chronic mastoiditis, left ear: Secondary | ICD-10-CM | POA: Diagnosis not present

## 2017-11-19 DIAGNOSIS — H6121 Impacted cerumen, right ear: Secondary | ICD-10-CM | POA: Diagnosis not present

## 2017-11-19 DIAGNOSIS — H6980 Other specified disorders of Eustachian tube, unspecified ear: Secondary | ICD-10-CM | POA: Diagnosis not present

## 2017-11-29 ENCOUNTER — Encounter: Payer: Self-pay | Admitting: Family Medicine

## 2017-11-29 ENCOUNTER — Other Ambulatory Visit: Payer: Self-pay | Admitting: Family Medicine

## 2017-11-29 ENCOUNTER — Ambulatory Visit (INDEPENDENT_AMBULATORY_CARE_PROVIDER_SITE_OTHER): Payer: Medicare Other | Admitting: Family Medicine

## 2017-11-29 VITALS — BP 135/80 | HR 78 | Wt 171.0 lb

## 2017-11-29 DIAGNOSIS — Z741 Need for assistance with personal care: Secondary | ICD-10-CM

## 2017-11-29 DIAGNOSIS — N401 Enlarged prostate with lower urinary tract symptoms: Secondary | ICD-10-CM

## 2017-11-29 DIAGNOSIS — R300 Dysuria: Secondary | ICD-10-CM

## 2017-11-29 DIAGNOSIS — R3914 Feeling of incomplete bladder emptying: Secondary | ICD-10-CM

## 2017-11-29 DIAGNOSIS — N390 Urinary tract infection, site not specified: Secondary | ICD-10-CM | POA: Diagnosis not present

## 2017-11-29 DIAGNOSIS — B9629 Other Escherichia coli [E. coli] as the cause of diseases classified elsewhere: Secondary | ICD-10-CM | POA: Insufficient documentation

## 2017-11-29 MED ORDER — NITROFURANTOIN MONOHYD MACRO 100 MG PO CAPS: 100.0000 mg | ORAL_CAPSULE | Freq: Two times a day (BID) | ORAL | 0 refills | Status: DC

## 2017-11-29 NOTE — Assessment & Plan Note (Signed)
Has not been taking his flomax. States that he doesn't want to take it. Advised him that he needs to take his flomax or his bladder will not empty and he will continue to get UTIs.

## 2017-11-29 NOTE — Assessment & Plan Note (Signed)
Due to incomplete emptying of bladder. Cysto did not show recurrence of cancer. Prostate mildly enlarged. Flomax increased- due to see them again in 3 weeks. Again discussed the importance of being able to fully empty his bladder. Will treat with abx for 2 weeks. Will get home health in to help with education regarding personal hygiene, evaluation of needs and help with personal care as they are able. Call with any concerns.

## 2017-11-29 NOTE — Progress Notes (Signed)
BP 135/80 (BP Location: Right Arm, Patient Position: Sitting, Cuff Size: Normal)   Pulse 78   Wt 171 lb (77.6 kg)   SpO2 99%   BMI 26.00 kg/m    Subjective:    Patient ID: Rick [name], male    DOB: 06-Mar-1941, 77 y.o.   MRN: [id number]  HPI: Rick [name] is a 77 y.o. male  Chief Complaint  Patient presents with  . Urinary Tract Infection   URINARY SYMPTOMS Duration: Past few day Dysuria: yes Urinary frequency: yes Urgency: yes Small volume voids: yes Symptom severity: moderate Urinary incontinence: yes Foul odor: yes Hematuria: no Abdominal pain: no Back pain: no Suprapubic pain/pressure: no Flank pain: no Fever:  no Vomiting: no Relief with cranberry juice: no Relief with pyridium: no Status: worse Previous urinary tract infection: yes Recurrent urinary tract infection: yes History of sexually transmitted disease: no Penile discharge: no Treatments attempted: antibiotics and increasing fluids   Relevant past medical, surgical, family and social history reviewed and updated as indicated. Interim medical history since our last visit reviewed. Allergies and medications reviewed and updated.  Review of Systems  Constitutional: Negative.   Respiratory: Negative.   Cardiovascular: Negative.   Gastrointestinal: Negative for abdominal distention, abdominal pain, anal bleeding, blood in stool, constipation, diarrhea, nausea, rectal pain and vomiting.  Genitourinary: Positive for difficulty urinating, dysuria, frequency and urgency. Negative for decreased urine volume, discharge, enuresis, flank pain, genital sores, hematuria, penile pain, penile swelling, scrotal swelling and testicular pain.  Musculoskeletal: Negative.   Skin: Negative.   Psychiatric/Behavioral: Negative.     Per HPI unless specifically indicated above     Objective:    BP 135/80 (BP Location: Right Arm, Patient Position: Sitting, Cuff Size: Normal)   Pulse 78   Wt 171 lb (77.6 kg)    SpO2 99%   BMI 26.00 kg/m   Wt Readings from Last 3 Encounters:  11/29/17 171 lb (77.6 kg)  10/25/17 170 lb (77.1 kg)  10/24/17 172 lb 3.2 oz (78.1 kg)    Physical Exam  Constitutional: He is oriented to person, place, and time. He appears well-developed and well-nourished. No distress.  HENT:  Head: Normocephalic and atraumatic.  Right Ear: Hearing normal.  Left Ear: Hearing normal.  Nose: Nose normal.  Eyes: Conjunctivae and lids are normal. Right eye exhibits no discharge. Left eye exhibits no discharge. No scleral icterus.  Cardiovascular: Normal rate, regular rhythm, normal heart sounds and intact distal pulses. Exam reveals no gallop and no friction rub.  No murmur heard. Pulmonary/Chest: Effort normal and breath sounds normal. No stridor. No respiratory distress. He has no wheezes. He has no rales. He exhibits no tenderness.  Abdominal: Soft. Bowel sounds are normal. He exhibits no distension and no mass. There is no tenderness. There is no rebound and no guarding. No hernia.  Musculoskeletal: Normal range of motion.  Neurological: He is alert and oriented to person, place, and time.  Skin: Skin is warm, dry and intact. Capillary refill takes less than 2 seconds. No rash noted. He is not diaphoretic. No erythema. No pallor.  Psychiatric: He has a normal mood and affect. His speech is normal and behavior is normal. Judgment and thought content normal. Cognition and memory are normal.  Nursing note and vitals reviewed.   Results for orders placed or performed in visit on 11/09/17  Microscopic Examination  Result Value Ref Range   WBC, UA 0-5 0 - 5 /hpf   RBC, UA None seen 0 -  2 /hpf   Epithelial Cells (non renal) 0-10 0 - 10 /hpf   Casts Present (A) None seen /lpf   Cast Type Hyaline casts N/A   Mucus, UA Present (A) Not Estab.   Bacteria, UA None seen None seen/Few  Urinalysis, Complete  Result Value Ref Range   Specific Gravity, UA 1.010 1.005 - 1.030   pH, UA 5.5 5.0  - 7.5   Color, UA Yellow Yellow   Appearance Ur Clear Clear   Leukocytes, UA Trace (A) Negative   Protein, UA Negative Negative/Trace   Glucose, UA Negative Negative   Ketones, UA Negative Negative   RBC, UA Negative Negative   Bilirubin, UA Negative Negative   Urobilinogen, Ur 0.2 0.2 - 1.0 mg/dL   Nitrite, UA Negative Negative   Microscopic Examination See below:       Assessment & Plan:   Problem List Items Addressed This Visit      Genitourinary   Benign prostatic hyperplasia with incomplete bladder emptying    Has not been taking his flomax. States that he doesn't want to take it. Advised him that he needs to take his flomax or his bladder will not empty and he will continue to get UTIs.       Relevant Medications   nitrofurantoin, macrocrystal-monohydrate, (MACROBID) 100 MG capsule   Recurrent UTI - Primary    Due to incomplete emptying of bladder. Cysto did not show recurrence of cancer. Prostate mildly enlarged. Flomax increased- due to see them again in 3 weeks. Again discussed the importance of being able to fully empty his bladder. Will treat with abx for 2 weeks. Will get home health in to help with education regarding personal hygiene, evaluation of needs and help with personal care as they are able. Call with any concerns.       Relevant Medications   nitrofurantoin, macrocrystal-monohydrate, (MACROBID) 100 MG capsule   Other Relevant Orders   Ambulatory referral to [facility]    Other Visit Diagnoses    Dysuria       +UTI   Relevant Orders   UA/M w/rflx Culture, Routine   Need for assistance with personal care       Per urology, having difficulty with bathing and personal cleaning- increasing UTIs. Will get home health in to help with education and care.   Relevant Orders   Ambulatory referral to Home Health       Follow up plan: Return As scheduled.

## 2017-12-02 LAB — URINE CULTURE, REFLEX

## 2017-12-02 LAB — UA/M W/RFLX CULTURE, ROUTINE
BILIRUBIN UA: NEGATIVE
Glucose, UA: NEGATIVE
KETONES UA: NEGATIVE
NITRITE UA: POSITIVE — AB
PH UA: 5.5 (ref 5.0–7.5)
Specific Gravity, UA: 1.025 (ref 1.005–1.030)
Urobilinogen, Ur: 0.2 mg/dL (ref 0.2–1.0)

## 2017-12-04 DIAGNOSIS — Z794 Long term (current) use of insulin: Secondary | ICD-10-CM | POA: Diagnosis not present

## 2017-12-04 DIAGNOSIS — E1136 Type 2 diabetes mellitus with diabetic cataract: Secondary | ICD-10-CM | POA: Diagnosis not present

## 2017-12-04 DIAGNOSIS — N390 Urinary tract infection, site not specified: Secondary | ICD-10-CM | POA: Diagnosis not present

## 2017-12-04 DIAGNOSIS — H409 Unspecified glaucoma: Secondary | ICD-10-CM | POA: Diagnosis not present

## 2017-12-04 DIAGNOSIS — R339 Retention of urine, unspecified: Secondary | ICD-10-CM | POA: Diagnosis not present

## 2017-12-04 DIAGNOSIS — E1140 Type 2 diabetes mellitus with diabetic neuropathy, unspecified: Secondary | ICD-10-CM | POA: Diagnosis not present

## 2017-12-04 DIAGNOSIS — E785 Hyperlipidemia, unspecified: Secondary | ICD-10-CM | POA: Diagnosis not present

## 2017-12-04 DIAGNOSIS — Z8551 Personal history of malignant neoplasm of bladder: Secondary | ICD-10-CM | POA: Diagnosis not present

## 2017-12-04 DIAGNOSIS — I10 Essential (primary) hypertension: Secondary | ICD-10-CM | POA: Diagnosis not present

## 2017-12-05 ENCOUNTER — Ambulatory Visit: Payer: Medicare Other | Admitting: Family Medicine

## 2017-12-06 ENCOUNTER — Encounter: Payer: Self-pay | Admitting: Family Medicine

## 2017-12-06 ENCOUNTER — Ambulatory Visit: Payer: Medicare Other | Admitting: Family Medicine

## 2017-12-06 ENCOUNTER — Ambulatory Visit (INDEPENDENT_AMBULATORY_CARE_PROVIDER_SITE_OTHER): Payer: Medicare Other | Admitting: Family Medicine

## 2017-12-06 VITALS — BP 136/82 | HR 78 | Wt 167.1 lb

## 2017-12-06 DIAGNOSIS — R339 Retention of urine, unspecified: Secondary | ICD-10-CM

## 2017-12-06 DIAGNOSIS — N400 Enlarged prostate without lower urinary tract symptoms: Secondary | ICD-10-CM | POA: Diagnosis not present

## 2017-12-06 DIAGNOSIS — Z0000 Encounter for general adult medical examination without abnormal findings: Secondary | ICD-10-CM

## 2017-12-06 DIAGNOSIS — N390 Urinary tract infection, site not specified: Secondary | ICD-10-CM | POA: Diagnosis not present

## 2017-12-06 DIAGNOSIS — E1142 Type 2 diabetes mellitus with diabetic polyneuropathy: Secondary | ICD-10-CM

## 2017-12-06 DIAGNOSIS — I739 Peripheral vascular disease, unspecified: Secondary | ICD-10-CM

## 2017-12-06 DIAGNOSIS — I10 Essential (primary) hypertension: Secondary | ICD-10-CM

## 2017-12-06 DIAGNOSIS — Z0001 Encounter for general adult medical examination with abnormal findings: Secondary | ICD-10-CM

## 2017-12-06 DIAGNOSIS — E782 Mixed hyperlipidemia: Secondary | ICD-10-CM

## 2017-12-06 DIAGNOSIS — F3170 Bipolar disorder, currently in remission, most recent episode unspecified: Secondary | ICD-10-CM

## 2017-12-06 DIAGNOSIS — E785 Hyperlipidemia, unspecified: Secondary | ICD-10-CM

## 2017-12-06 LAB — BAYER DCA HB A1C WAIVED: HB A1C: 6.1 % (ref <7.0)

## 2017-12-06 MED ORDER — ONETOUCH ULTRASOFT LANCETS MISC
12 refills | Status: DC
Start: 2017-12-06 — End: 2019-04-22

## 2017-12-06 MED ORDER — TRAZODONE HCL 100 MG PO TABS: 100.0000 mg | ORAL_TABLET | Freq: Every day | ORAL | 1 refills | Status: DC

## 2017-12-06 MED ORDER — FLUTICASONE PROPIONATE 50 MCG/ACT NA SUSP: 2.0000 | Freq: Two times a day (BID) | NASAL | 6 refills | Status: DC

## 2017-12-06 MED ORDER — INSULIN PEN NEEDLE 29G X 12MM MISC
12 refills | Status: DC
Start: 2017-12-06 — End: 2018-05-28

## 2017-12-06 MED ORDER — ENALAPRIL MALEATE 10 MG PO TABS: 10.0000 mg | ORAL_TABLET | Freq: Every day | ORAL | 1 refills | Status: DC

## 2017-12-06 MED ORDER — GABAPENTIN 300 MG PO CAPS: 600.0000 mg | ORAL_CAPSULE | Freq: Two times a day (BID) | ORAL | 1 refills | Status: DC

## 2017-12-06 MED ORDER — CARVEDILOL 6.25 MG PO TABS: 6.2500 mg | ORAL_TABLET | Freq: Two times a day (BID) | ORAL | 1 refills | Status: DC

## 2017-12-06 MED ORDER — GLIPIZIDE ER 2.5 MG PO TB24: 2.5000 mg | ORAL_TABLET | Freq: Every day | ORAL | 1 refills | Status: DC

## 2017-12-06 MED ORDER — VALACYCLOVIR HCL 1 G PO TABS: 1000.0000 mg | ORAL_TABLET | Freq: Every day | ORAL | 1 refills | Status: DC | PRN

## 2017-12-06 MED ORDER — FINASTERIDE 5 MG PO TABS: 5.0000 mg | ORAL_TABLET | Freq: Every day | ORAL | 1 refills | Status: DC

## 2017-12-06 MED ORDER — AMOXICILLIN 875 MG PO TABS: 875.0000 mg | ORAL_TABLET | Freq: Two times a day (BID) | ORAL | 0 refills | Status: DC

## 2017-12-06 MED ORDER — MIRTAZAPINE 15 MG PO TABS: 15.0000 mg | ORAL_TABLET | Freq: Every day | ORAL | 1 refills | Status: DC

## 2017-12-06 MED ORDER — METFORMIN HCL 1000 MG PO TABS: ORAL_TABLET | ORAL | 1 refills | Status: DC

## 2017-12-06 MED ORDER — LOVASTATIN 40 MG PO TABS: 40.0000 mg | ORAL_TABLET | Freq: Every day | ORAL | 1 refills | Status: DC

## 2017-12-06 MED ORDER — HYDROCHLOROTHIAZIDE 25 MG PO TABS: ORAL_TABLET | ORAL | 1 refills | Status: DC

## 2017-12-06 MED ORDER — INSULIN GLARGINE 100 UNIT/ML SOLOSTAR PEN: 15.0000 [IU] | PEN_INJECTOR | Freq: Every day | SUBCUTANEOUS | 1 refills | Status: DC

## 2017-12-06 NOTE — Assessment & Plan Note (Signed)
Still has UTI- await culture. Will treat with amoxicillin. Recheck 2 weeks.

## 2017-12-06 NOTE — Assessment & Plan Note (Signed)
Not able to tolerate flomax- made his BP drop into the 90s/50s and he almost passed out. Will stop it and try to get him back into see urology- may need to discuss other options.

## 2017-12-06 NOTE — Assessment & Plan Note (Signed)
Under good control with A1c of 6.1. Call with any concerns. Refills given.

## 2017-12-06 NOTE — Progress Notes (Signed)
BP 136/82 (BP Location: Left Arm, Patient Position: Sitting, Cuff Size: Normal)   Pulse 78   Wt 167 lb 1 oz (75.8 kg)   SpO2 99%   BMI 25.40 kg/m    Subjective:    Patient ID: Rick [name], male    DOB: [birth year], 77 y.o.   MRN: [id number]  HPI: Rick [name] is a 77 y.o. male presenting on 12/06/2017 for comprehensive medical examination. Current medical complaints include:  HYPERTENSION / HYPERLIPIDEMIA Satisfied with current treatment? yes Duration of hypertension: chronic BP monitoring frequency: not checking BP medication side effects: no Past BP meds: caredilol, HCTZ, enalapril Duration of hyperlipidemia: chronic Cholesterol medication side effects: no Cholesterol supplements: none Past cholesterol medications: lovastatin Medication compliance: excellent compliance Aspirin: no Recent stressors: no Recurrent headaches: no Visual changes: no Palpitations: no Dyspnea: no Chest pain: no Lower extremity edema: no Dizzy/lightheaded: yes  DIABETES Hypoglycemic episodes:no Polydipsia/polyuria: no Visual disturbance: no Chest pain: no Paresthesias: no Glucose Monitoring: yes  Accucheck frequency: BID Taking Insulin?: yes  Long acting insulin:  Short acting insulin: Blood Pressure Monitoring: not checking Retinal Examination: Up to Date Foot Exam: Up to Date Diabetic Education: Completed Pneumovax: Up to Date Influenza: Up to Date Aspirin: yes  URINARY SYMPTOMS- feeling better on the macrobid   He currently lives with: alone with his animals Interim Problems from his last visit: no  Functional Status Survey:    FALL RISK: Fall Risk  09/06/2017 12/07/2016 09/05/2016 08/05/2015 05/25/2015  Falls in the past year? No Yes No No No  Number falls in past yr: - 1 - - -  Injury with Fall? - No - - -    Depression Screen Depression screen [facility] 2/9 09/06/2017 12/07/2016 09/05/2016 08/05/2015 05/25/2015  Decreased Interest 0 0 0 0 0  Down, Depressed,  Hopeless 0 0 0 1 0  PHQ - 2 Score 0 0 0 1 0  Altered sleeping 0 - - - -  Tired, decreased energy 3 - - - -  Change in appetite 0 - - - -  Feeling bad or failure about yourself  0 - - - -  Trouble concentrating 0 - - - -  Moving slowly or fidgety/restless 0 - - - -  Suicidal thoughts 0 - - - -  PHQ-9 Score 3 - - - -    Past Medical History:  Past Medical History:  Diagnosis Date  . Anxiety   . Bipolar disorder ([place])   . Bladder cancer ([place])   . BPH (benign prostatic hypertrophy)   . Chest pain, atypical 06/23/2015  . Depression   . Diabetes mellitus without complication ([place])   . Difficult or painful urination 10/31/2012  . Dysrhythmia   . Ear problem 03/24/2015  . Gangrenous appendicitis   . H/O urinary disorder 03/27/2013  . Herpes genitalis in men   . Hyperlipidemia   . Hypertension   . Insomnia   . Kidney stones   . Left hand pain 05/18/2015  . Mass of arm 02/10/2015  . Peripheral neuropathy   . Skin cancer   . UD (urethral discharge) 10/31/2012    Surgical History:  Past Surgical History:  Procedure Laterality Date  . APPENDECTOMY     RUPTURED  . BLADDER SURGERY    . CHOLECYSTECTOMY    . COLOSTOMY     AND LATER CLOSURE  . FRACTURE SURGERY    . HERNIA REPAIR    . INNER EAR SURGERY Left   . ORIF ANKLE FRACTURE  Left 09/27/2015   Procedure: OPEN REDUCTION INTERNAL FIXATION (ORIF) ANKLE FRACTURE;  Surgeon: Corky Mull, MD;  Location: [facility] ORS;  Service: Orthopedics;  Laterality: Left;  . SKIN CANCER EXCISION    . VENTRAL HERNIA REPAIR N/A 01/14/2016   Procedure: HERNIA REPAIR VENTRAL ADULT;  Surgeon: Leonie Green, MD;  Location: [facility] ORS;  Service: General;  Laterality: N/A;    Medications:  Current Outpatient Medications on File Prior to Visit  Medication Sig  . Blood Glucose Monitoring Suppl (ONE TOUCH ULTRA SYSTEM KIT) w/Device KIT 1 kit by Does not apply route once.  . diclofenac sodium (VOLTAREN) 1 % GEL Apply 2 g topically 4 (four) times daily.  .  nitrofurantoin, macrocrystal-monohydrate, (MACROBID) 100 MG capsule Take 1 capsule (100 mg total) by mouth 2 (two) times daily.  . ONE TOUCH ULTRA TEST test strip TEST BLOOD SUGAR THREE TIMES DAILY.  [name] pseudoephedrine (SUDAFED) 30 MG tablet Take 1 tablet (30 mg total) by mouth every 6 (six) hours as needed for congestion.  . sildenafil (REVATIO) 20 MG tablet 2-5 tabs 1 hour prior to intercourse  . tamsulosin (FLOMAX) 0.4 MG CAPS capsule Take 2 capsules (0.8 mg total) by mouth daily. (Patient not taking: Reported on 12/06/2017)   Current Facility-Administered Medications on File Prior to Visit  Medication  . ciprofloxacin (CIPRO) tablet 500 mg  . lidocaine (XYLOCAINE) 2 % jelly 1 application    Allergies:  Allergies  Allergen Reactions  . Haloperidol     Other reaction(s): Unknown  . Sulfa Antibiotics     Other reaction(s): OTHER    Social History:  Social History   Socioeconomic History  . Marital status: Divorced    Spouse name: Not on file  . Number of children: Not on file  . Years of education: Not on file  . Highest education level: Not on file  Occupational History  . Not on file  Social Needs  . Financial resource strain: Not on file  . Food insecurity:    Worry: Not on file    Inability: Not on file  . Transportation needs:    Medical: Not on file    Non-medical: Not on file  Tobacco Use  . Smoking status: Former Smoker    Packs/day: 1.50    Years: 15.00    Pack years: 22.50    Types: Cigarettes    Start date: 08/14/1994    Last attempt to quit: 03/23/2005    Years since quitting: 12.7  . Smokeless tobacco: Former Systems developer    Types: Snuff, Sarina Ser    Quit date: 03/23/2010  Substance and Sexual Activity  . Alcohol use: No    Alcohol/week: 0.0 oz  . Drug use: No  . Sexual activity: Never  Lifestyle  . Physical activity:    Days per week: Not on file    Minutes per session: Not on file  . Stress: Not on file  Relationships  . Social connections:    Talks on  phone: Not on file    Gets together: Not on file    Attends religious service: Not on file    Active member of club or organization: Not on file    Attends meetings of clubs or organizations: Not on file    Relationship status: Not on file  . Intimate partner violence:    Fear of current or ex partner: Not on file    Emotionally abused: Not on file    Physically abused: Not on file  Forced sexual activity: Not on file  Other Topics Concern  . Not on file  Social History Narrative  . Not on file   Social History   Tobacco Use  Smoking Status Former Smoker  . Packs/day: 1.50  . Years: 15.00  . Pack years: 22.50  . Types: Cigarettes  . Start date: 08/14/1994  . Last attempt to quit: 03/23/2005  . Years since quitting: 12.7  Smokeless Tobacco Former Systems developer  . Types: Snuff, Chew  . Quit date: 03/23/2010   Social History   Substance and Sexual Activity  Alcohol Use No  . Alcohol/week: 0.0 oz    Family History:  Family History  Problem Relation Age of Onset  . Transient ischemic attack Mother   . Diabetes Father   . Cancer Brother     Past medical history, surgical history, medications, allergies, family history and social history reviewed with patient today and changes made to appropriate areas of the chart.   Review of Systems  Constitutional: Negative.   HENT: Positive for hearing loss. Negative for congestion, ear discharge, ear pain, nosebleeds, sinus pain, sore throat and tinnitus.   Eyes: Negative.   Respiratory: Negative.  Negative for stridor.   Cardiovascular: Negative.   Gastrointestinal: Negative.   Genitourinary: Positive for dysuria and frequency. Negative for flank pain, hematuria and urgency.  Musculoskeletal: Negative.   Skin: Negative.   Neurological: Positive for dizziness, tingling and headaches. Negative for tremors, sensory change, speech change, focal weakness, seizures, loss of consciousness and weakness.  Endo/Heme/Allergies: Negative.     Psychiatric/Behavioral: Negative for depression, hallucinations, memory loss, substance abuse and suicidal ideas. The patient is not nervous/anxious and does not have insomnia.     All other ROS negative except what is listed above and in the HPI.      Objective:    BP 136/82 (BP Location: Left Arm, Patient Position: Sitting, Cuff Size: Normal)   Pulse 78   Wt 167 lb 1 oz (75.8 kg)   SpO2 99%   BMI 25.40 kg/m   Wt Readings from Last 3 Encounters:  12/06/17 167 lb 1 oz (75.8 kg)  11/29/17 171 lb (77.6 kg)  10/25/17 170 lb (77.1 kg)    Physical Exam  Constitutional: He is oriented to person, place, and time. He appears well-developed and well-nourished. No distress.  deshevelled  HENT:  Head: Normocephalic and atraumatic.  Right Ear: Hearing and external ear normal.  Left Ear: Hearing and external ear normal.  Nose: Nose normal.  Mouth/Throat: Oropharynx is clear and moist. No oropharyngeal exudate.  Eyes: Pupils are equal, round, and reactive to light. Conjunctivae, EOM and lids are normal. Right eye exhibits no discharge. Left eye exhibits no discharge. No scleral icterus.  Neck: Normal range of motion. Neck supple. No JVD present. No tracheal deviation present. No thyromegaly present.  Cardiovascular: Normal rate, regular rhythm, normal heart sounds and intact distal pulses. Exam reveals no gallop and no friction rub.  No murmur heard. Pulmonary/Chest: Effort normal and breath sounds normal. No stridor. No respiratory distress. He has no wheezes. He has no rales. He exhibits no tenderness.  Abdominal: Soft. Bowel sounds are normal. He exhibits no distension and no mass. There is no tenderness. There is no rebound and no guarding. No hernia.  Genitourinary:  Genitourinary Comments: Deferred, done at Urology  Musculoskeletal: Normal range of motion. He exhibits no edema, tenderness or deformity.  Lymphadenopathy:    He has no cervical adenopathy.  Neurological: He is alert and  oriented to person, place, and time. He displays normal reflexes. No cranial nerve deficit or sensory deficit. He exhibits normal muscle tone. Coordination normal.  Skin: Skin is warm, dry and intact. Capillary refill takes less than 2 seconds. No rash noted. He is not diaphoretic. No erythema. No pallor.  Psychiatric: He has a normal mood and affect. His speech is normal. Judgment and thought content normal. He is agitated and aggressive. Cognition and memory are normal.  Nursing note and vitals reviewed.   6CIT Screen 12/06/2017 12/07/2016  What Year? 0 points 0 points  What month? 0 points 0 points  What time? 0 points 3 points  Count back from 20 0 points 0 points  Months in reverse 0 points 0 points  Repeat phrase 4 points 4 points  Total Score 4 7    Results for orders placed or performed in visit on 11/29/17  Urine Culture, Reflex  Result Value Ref Range   Urine Culture, Routine Final report (A)    Organism ID, Bacteria Escherichia coli (A)    Antimicrobial Susceptibility Comment   UA/M w/rflx Culture, Routine  Result Value Ref Range   Specific Gravity, UA 1.025 1.005 - 1.030   pH, UA 5.5 5.0 - 7.5   Color, UA Yellow Yellow   Appearance Ur Turbid (A) Clear   Leukocytes, UA 3+ (A) Negative   Protein, UA 1+ (A) Negative/Trace   Glucose, UA Negative Negative   Ketones, UA Negative Negative   RBC, UA 1+ (A) Negative   Bilirubin, UA Negative Negative   Urobilinogen, Ur 0.2 0.2 - 1.0 mg/dL   Nitrite, UA Positive (A) Negative   Urinalysis Reflex Comment       Assessment & Plan:   Problem List Items Addressed This Visit      Cardiovascular and Mediastinum   Hypertension    Under good control. Continue current regimen. Continue to monitor. Call with any concerns. Refills given today.      Relevant Medications   lovastatin (MEVACOR) 40 MG tablet   hydrochlorothiazide (HYDRODIURIL) 25 MG tablet   enalapril (VASOTEC) 10 MG tablet   carvedilol (COREG) 6.25 MG tablet    Other Relevant Orders   CBC with Differential/Platelet   Comprehensive metabolic panel   Microalbumin, Urine Waived   TSH   PVD (peripheral vascular disease) (HCC)    Stable. Continue to keep BP and cholesterol and sugars under good control. Continue to monitor. Call with any concerns.       Relevant Medications   lovastatin (MEVACOR) 40 MG tablet   hydrochlorothiazide (HYDRODIURIL) 25 MG tablet   enalapril (VASOTEC) 10 MG tablet   carvedilol (COREG) 6.25 MG tablet   Other Relevant Orders   CBC with Differential/Platelet   Comprehensive metabolic panel   TSH     Endocrine   Diabetic peripheral neuropathy associated with type 2 diabetes mellitus ([place])    Under good control with A1c of 6.1. Call with any concerns. Refills given.       Relevant Medications   metFORMIN (GLUCOPHAGE) 1000 MG tablet   mirtazapine (REMERON) 15 MG tablet   traZODone (DESYREL) 100 MG tablet   lovastatin (MEVACOR) 40 MG tablet   Insulin Glargine (LANTUS SOLOSTAR) 100 UNIT/ML Solostar Pen   glipiZIDE (GLUCOTROL XL) 2.5 MG 24 hr tablet   gabapentin (NEURONTIN) 300 MG capsule   enalapril (VASOTEC) 10 MG tablet   Other Relevant Orders   Bayer DCA Hb A1c Waived   CBC with Differential/Platelet   Comprehensive metabolic panel  Microalbumin, Urine Waived   TSH     Genitourinary   Benign fibroma of prostate   Relevant Medications   finasteride (PROSCAR) 5 MG tablet   Recurrent UTI    Still has UTI- await culture. Will treat with amoxicillin. Recheck 2 weeks.       Relevant Medications   valACYclovir (VALTREX) 1000 MG tablet   Other Relevant Orders   CBC with Differential/Platelet   Comprehensive metabolic panel   TSH   UA/M w/rflx Culture, Routine     Other   Affective bipolar disorder ([place])    Stable. Refuses to see psychiatry. Refuses PHQ9. Continue to monitor.       Hyperlipidemia    Rechecking levels today. Await results. Call with any concerns.       Relevant Medications    lovastatin (MEVACOR) 40 MG tablet   hydrochlorothiazide (HYDRODIURIL) 25 MG tablet   enalapril (VASOTEC) 10 MG tablet   carvedilol (COREG) 6.25 MG tablet   Other Relevant Orders   CBC with Differential/Platelet   Comprehensive metabolic panel   Lipid Panel w/o Chol/HDL Ratio   TSH   Incomplete bladder emptying    Not able to tolerate flomax- made his BP drop into the 90s/50s and he almost passed out. Will stop it and try to get him back into see urology- may need to discuss other options.        Other Visit Diagnoses    Medicare annual wellness visit, subsequent    -  Primary   Preventative care discussed as below. Call with any concerns.    Routine general medical examination at a health care facility       Vaccines up to date. Screening labs checked today. Continue diet and exercise. Preventative care discussed as below.    Dyslipidemia       Relevant Medications   lovastatin (MEVACOR) 40 MG tablet       Preventative Services:  Health Risk Assessment and Personalized Prevention Plan: Done today Bone Mass Measurements: N/A CVD Screening: Done today Colon Cancer Screening: N/A Depression Screening: Refused Diabetes Screening: Done today Glaucoma Screening: See your Eye Doctor Hepatitis B vaccine: N/A Hepatitis C screening: N/A HIV Screening: N/A Flu Vaccine: Wait until Flu season Lung cancer Screening: N/A Obesity Screening: Done today Pneumonia Vaccines (2): up to date STI Screening: N/A PSA screening: Up to date  Discussed aspirin prophylaxis for myocardial infarction prevention and decision was that we recommended ASA, and patient refused  LABORATORY TESTING:  Health maintenance labs ordered today as discussed above.   IMMUNIZATIONS:   - Tdap: Tetanus vaccination status reviewed: Refused. - Influenza: Up to date - Pneumovax: Up to date - Prevnar: Up to date - Zostavax vaccine: Not applicable  SCREENING: - Colonoscopy: Refused  Discussed with patient purpose  of the colonoscopy is to detect colon cancer at curable precancerous or early stages   PATIENT COUNSELING:    Sexuality: Discussed sexually transmitted diseases, partner selection, use of condoms, avoidance of unintended pregnancy  and contraceptive alternatives.   Advised to avoid cigarette smoking.  I discussed with the patient that most people either abstain from alcohol or drink within safe limits (<=14/week and <=4 drinks/occasion for males, <=7/weeks and <= 3 drinks/occasion for females) and that the risk for alcohol disorders and other health effects rises proportionally with the number of drinks per week and how often a drinker exceeds daily limits.  Discussed cessation/primary prevention of drug use and availability of treatment for abuse.   Diet: Encouraged  to adjust caloric intake to maintain  or achieve ideal body weight, to reduce intake of dietary saturated fat and total fat, to limit sodium intake by avoiding high sodium foods and not adding table salt, and to maintain adequate dietary potassium and calcium preferably from fresh fruits, vegetables, and low-fat dairy products.    stressed the importance of regular exercise  Injury prevention: Discussed safety belts, safety helmets, smoke detector, smoking near bedding or upholstery.   Dental health: Discussed importance of regular tooth brushing, flossing, and dental visits.   Follow up plan: NEXT PREVENTATIVE PHYSICAL DUE IN 1 YEAR. Return 2 weeks, for Recheck urine.

## 2017-12-06 NOTE — Assessment & Plan Note (Signed)
Under good control. Continue current regimen. Continue to monitor. Call with any concerns. Refills given today. 

## 2017-12-06 NOTE — Assessment & Plan Note (Signed)
Rechecking levels today. Await results. Call with any concerns.  

## 2017-12-06 NOTE — Assessment & Plan Note (Signed)
Stable. Refuses to see psychiatry. Refuses PHQ9. Continue to monitor.

## 2017-12-06 NOTE — Assessment & Plan Note (Signed)
Stable. Continue to keep BP and cholesterol and sugars under good control. Continue to monitor. Call with any concerns.

## 2017-12-07 ENCOUNTER — Telehealth: Payer: Self-pay | Admitting: Family Medicine

## 2017-12-07 LAB — CBC WITH DIFFERENTIAL/PLATELET
Basophils Absolute: 0.1 10*3/uL (ref 0.0–0.2)
Basos: 1 %
EOS (ABSOLUTE): 0.5 10*3/uL — ABNORMAL HIGH (ref 0.0–0.4)
EOS: 6 %
HEMOGLOBIN: 14.6 g/dL (ref 13.0–17.7)
Hematocrit: 43.5 % (ref 37.5–51.0)
IMMATURE GRANS (ABS): 0 10*3/uL (ref 0.0–0.1)
IMMATURE GRANULOCYTES: 1 %
LYMPHS: 32 %
Lymphocytes Absolute: 2.5 10*3/uL (ref 0.7–3.1)
MCH: 28.5 pg (ref 26.6–33.0)
MCHC: 33.6 g/dL (ref 31.5–35.7)
MCV: 85 fL (ref 79–97)
MONOCYTES: 7 %
Monocytes Absolute: 0.5 10*3/uL (ref 0.1–0.9)
NEUTROS PCT: 53 %
Neutrophils Absolute: 4.2 10*3/uL (ref 1.4–7.0)
Platelets: 310 10*3/uL (ref 150–379)
RBC: 5.12 x10E6/uL (ref 4.14–5.80)
RDW: 14.9 % (ref 12.3–15.4)
WBC: 7.7 10*3/uL (ref 3.4–10.8)

## 2017-12-07 LAB — COMPREHENSIVE METABOLIC PANEL
A/G RATIO: 1.7 (ref 1.2–2.2)
ALK PHOS: 54 IU/L (ref 39–117)
ALT: 10 IU/L (ref 0–44)
AST: 18 IU/L (ref 0–40)
Albumin: 4.7 g/dL (ref 3.5–4.8)
BILIRUBIN TOTAL: 0.7 mg/dL (ref 0.0–1.2)
BUN/Creatinine Ratio: 12 (ref 10–24)
BUN: 14 mg/dL (ref 8–27)
CHLORIDE: 104 mmol/L (ref 96–106)
CO2: 21 mmol/L (ref 20–29)
Calcium: 10.2 mg/dL (ref 8.6–10.2)
Creatinine, Ser: 1.16 mg/dL (ref 0.76–1.27)
GFR calc Af Amer: 70 mL/min/{1.73_m2} (ref >59)
GFR calc non Af Amer: 61 mL/min/{1.73_m2} (ref >59)
GLUCOSE: 115 mg/dL — AB (ref 65–99)
Globulin, Total: 2.8 g/dL (ref 1.5–4.5)
POTASSIUM: 3.9 mmol/L (ref 3.5–5.2)
Sodium: 142 mmol/L (ref 134–144)
TOTAL PROTEIN: 7.5 g/dL (ref 6.0–8.5)

## 2017-12-07 LAB — TSH: TSH: 1.91 u[IU]/mL (ref 0.450–4.500)

## 2017-12-07 LAB — LIPID PANEL W/O CHOL/HDL RATIO
CHOLESTEROL TOTAL: 142 mg/dL (ref 100–199)
HDL: 45 mg/dL (ref >39)
LDL Calculated: 71 mg/dL (ref 0–99)
TRIGLYCERIDES: 131 mg/dL (ref 0–149)
VLDL CHOLESTEROL CAL: 26 mg/dL (ref 5–40)

## 2017-12-07 NOTE — Telephone Encounter (Signed)
Patient notified

## 2017-12-07 NOTE — Telephone Encounter (Signed)
Please let Rick [name] know that all his labs came back normal. Still waiting on the culture of his urine- it should be back next week.

## 2017-12-08 DIAGNOSIS — I10 Essential (primary) hypertension: Secondary | ICD-10-CM | POA: Diagnosis not present

## 2017-12-08 DIAGNOSIS — E785 Hyperlipidemia, unspecified: Secondary | ICD-10-CM | POA: Diagnosis not present

## 2017-12-08 DIAGNOSIS — E1140 Type 2 diabetes mellitus with diabetic neuropathy, unspecified: Secondary | ICD-10-CM | POA: Diagnosis not present

## 2017-12-08 DIAGNOSIS — R339 Retention of urine, unspecified: Secondary | ICD-10-CM | POA: Diagnosis not present

## 2017-12-08 DIAGNOSIS — Z794 Long term (current) use of insulin: Secondary | ICD-10-CM | POA: Diagnosis not present

## 2017-12-08 DIAGNOSIS — H409 Unspecified glaucoma: Secondary | ICD-10-CM | POA: Diagnosis not present

## 2017-12-08 DIAGNOSIS — N390 Urinary tract infection, site not specified: Secondary | ICD-10-CM | POA: Diagnosis not present

## 2017-12-08 DIAGNOSIS — Z8551 Personal history of malignant neoplasm of bladder: Secondary | ICD-10-CM | POA: Diagnosis not present

## 2017-12-08 DIAGNOSIS — E1136 Type 2 diabetes mellitus with diabetic cataract: Secondary | ICD-10-CM | POA: Diagnosis not present

## 2017-12-11 DIAGNOSIS — E1136 Type 2 diabetes mellitus with diabetic cataract: Secondary | ICD-10-CM | POA: Diagnosis not present

## 2017-12-11 DIAGNOSIS — I10 Essential (primary) hypertension: Secondary | ICD-10-CM | POA: Diagnosis not present

## 2017-12-11 DIAGNOSIS — Z794 Long term (current) use of insulin: Secondary | ICD-10-CM | POA: Diagnosis not present

## 2017-12-11 DIAGNOSIS — N390 Urinary tract infection, site not specified: Secondary | ICD-10-CM | POA: Diagnosis not present

## 2017-12-11 DIAGNOSIS — H409 Unspecified glaucoma: Secondary | ICD-10-CM | POA: Diagnosis not present

## 2017-12-11 DIAGNOSIS — E785 Hyperlipidemia, unspecified: Secondary | ICD-10-CM | POA: Diagnosis not present

## 2017-12-11 DIAGNOSIS — R339 Retention of urine, unspecified: Secondary | ICD-10-CM | POA: Diagnosis not present

## 2017-12-11 DIAGNOSIS — Z8551 Personal history of malignant neoplasm of bladder: Secondary | ICD-10-CM | POA: Diagnosis not present

## 2017-12-11 DIAGNOSIS — E1140 Type 2 diabetes mellitus with diabetic neuropathy, unspecified: Secondary | ICD-10-CM | POA: Diagnosis not present

## 2017-12-14 ENCOUNTER — Other Ambulatory Visit: Payer: Medicare Other

## 2017-12-14 DIAGNOSIS — R339 Retention of urine, unspecified: Secondary | ICD-10-CM | POA: Diagnosis not present

## 2017-12-14 DIAGNOSIS — N390 Urinary tract infection, site not specified: Secondary | ICD-10-CM | POA: Diagnosis not present

## 2017-12-14 DIAGNOSIS — E1136 Type 2 diabetes mellitus with diabetic cataract: Secondary | ICD-10-CM | POA: Diagnosis not present

## 2017-12-14 DIAGNOSIS — E785 Hyperlipidemia, unspecified: Secondary | ICD-10-CM | POA: Diagnosis not present

## 2017-12-14 DIAGNOSIS — I10 Essential (primary) hypertension: Secondary | ICD-10-CM | POA: Diagnosis not present

## 2017-12-14 DIAGNOSIS — Z794 Long term (current) use of insulin: Secondary | ICD-10-CM | POA: Diagnosis not present

## 2017-12-14 DIAGNOSIS — Z8551 Personal history of malignant neoplasm of bladder: Secondary | ICD-10-CM | POA: Diagnosis not present

## 2017-12-14 DIAGNOSIS — H409 Unspecified glaucoma: Secondary | ICD-10-CM | POA: Diagnosis not present

## 2017-12-14 DIAGNOSIS — E1140 Type 2 diabetes mellitus with diabetic neuropathy, unspecified: Secondary | ICD-10-CM | POA: Diagnosis not present

## 2017-12-14 LAB — UA/M W/RFLX CULTURE, ROUTINE
BILIRUBIN UA: NEGATIVE
GLUCOSE, UA: NEGATIVE
KETONES UA: NEGATIVE
NITRITE UA: POSITIVE — AB
PROTEIN UA: NEGATIVE
RBC UA: NEGATIVE
Specific Gravity, UA: <1.005 — ABNORMAL LOW (ref 1.005–1.030)
Urobilinogen, Ur: 0.2 mg/dL (ref 0.2–1.0)
pH, UA: 5 (ref 5.0–7.5)

## 2017-12-14 LAB — MICROSCOPIC EXAMINATION: RBC, UA: NONE SEEN /hpf (ref 0–2)

## 2017-12-14 LAB — MICROALBUMIN, URINE WAIVED
CREATININE, URINE WAIVED: 100 mg/dL (ref 10–300)
Microalb, Ur Waived: 30 mg/L — ABNORMAL HIGH (ref 0–19)

## 2017-12-14 LAB — URINE CULTURE, REFLEX

## 2017-12-16 LAB — UA/M W/RFLX CULTURE, ROUTINE
Bilirubin, UA: NEGATIVE
Glucose, UA: NEGATIVE
KETONES UA: NEGATIVE
NITRITE UA: NEGATIVE
Protein, UA: NEGATIVE
RBC, UA: NEGATIVE
SPEC GRAV UA: 1.02 (ref 1.005–1.030)
Urobilinogen, Ur: 0.2 mg/dL (ref 0.2–1.0)
pH, UA: 5 (ref 5.0–7.5)

## 2017-12-16 LAB — MICROSCOPIC EXAMINATION

## 2017-12-16 LAB — URINE CULTURE, REFLEX: ORGANISM ID, BACTERIA: NO GROWTH

## 2017-12-17 ENCOUNTER — Telehealth: Payer: Self-pay | Admitting: Family Medicine

## 2017-12-17 NOTE — Telephone Encounter (Signed)
Called patient, no answer, no voicemail, will try again.

## 2017-12-17 NOTE — Telephone Encounter (Signed)
Patient notified

## 2017-12-17 NOTE — Telephone Encounter (Signed)
-----   Message from Guadalupe Maple, MD sent at 12/17/2017  8:47 AM EDT ----- labs

## 2017-12-17 NOTE — Telephone Encounter (Signed)
Please let him know that his urine looks like it cleared up with the last antibiotic. No bacteria grew out this time. He should keep his appointment with Dr. Bernardo Heater to let him know that he can't take the flomax and see how he wants to proceed. Thanks!

## 2017-12-21 ENCOUNTER — Ambulatory Visit: Payer: Medicare Other | Admitting: Urology

## 2017-12-21 ENCOUNTER — Encounter: Payer: Self-pay | Admitting: Family Medicine

## 2017-12-21 ENCOUNTER — Ambulatory Visit (INDEPENDENT_AMBULATORY_CARE_PROVIDER_SITE_OTHER): Payer: Medicare Other | Admitting: Family Medicine

## 2017-12-21 VITALS — BP 145/83 | HR 66 | Temp 97.3°F | Wt 170.7 lb

## 2017-12-21 DIAGNOSIS — R300 Dysuria: Secondary | ICD-10-CM | POA: Diagnosis not present

## 2017-12-21 DIAGNOSIS — N390 Urinary tract infection, site not specified: Secondary | ICD-10-CM | POA: Diagnosis not present

## 2017-12-21 MED ORDER — AMOXICILLIN 875 MG PO TABS
875.0000 mg | ORAL_TABLET | Freq: Two times a day (BID) | ORAL | 0 refills | Status: DC
Start: 2017-12-21 — End: 2018-01-18

## 2017-12-21 NOTE — Progress Notes (Signed)
BP (!) 145/83   Pulse 66   Temp (!) 97.3 F (36.3 C) (Oral)   Wt 170 lb 11.2 oz (77.4 kg)   SpO2 99%   BMI 25.95 kg/m    Subjective:    Patient ID: Rick [name], male    DOB: 17-Nov-1940, 77 y.o.   MRN: [id number]  HPI: Rick [name] is a 77 y.o. male  Chief Complaint  Patient presents with  . Urinary Tract Infection    pt states he has had burning with urination   URINARY SYMPTOMS Duration: 1-2 days Dysuria: yes Urinary frequency: yes Urgency: yes Small volume voids: yes Symptom severity: moderate Urinary incontinence: yes Foul odor: yes Hematuria: no Abdominal pain: no Back pain: no Suprapubic pain/pressure: yes Flank pain: no Fever:  no Vomiting: no Relief with cranberry juice: no Relief with pyridium: no Status:worse Previous urinary tract infection: yes Recurrent urinary tract infection: yes Treatments attempted: increasing fluids    Relevant past medical, surgical, family and social history reviewed and updated as indicated. Interim medical history since our last visit reviewed. Allergies and medications reviewed and updated.  Review of Systems  Constitutional: Negative.   Respiratory: Negative.   Cardiovascular: Negative.   Genitourinary: Positive for dysuria, frequency and urgency. Negative for decreased urine volume, difficulty urinating, discharge, enuresis, flank pain, genital sores, hematuria, penile pain, penile swelling, scrotal swelling and testicular pain.  Musculoskeletal: Negative.   Neurological: Negative.   Psychiatric/Behavioral: Negative.     Per HPI unless specifically indicated above     Objective:    BP (!) 145/83   Pulse 66   Temp (!) 97.3 F (36.3 C) (Oral)   Wt 170 lb 11.2 oz (77.4 kg)   SpO2 99%   BMI 25.95 kg/m   Wt Readings from Last 3 Encounters:  12/21/17 170 lb 11.2 oz (77.4 kg)  12/06/17 167 lb 1 oz (75.8 kg)  11/29/17 171 lb (77.6 kg)    Physical Exam  Constitutional: He is oriented to person,  place, and time. He appears well-developed and well-nourished. No distress.  HENT:  Head: Normocephalic and atraumatic.  Right Ear: Hearing normal.  Left Ear: Hearing normal.  Nose: Nose normal.  Eyes: Conjunctivae and lids are normal. Right eye exhibits no discharge. Left eye exhibits no discharge. No scleral icterus.  Cardiovascular: Normal rate, regular rhythm, normal heart sounds and intact distal pulses. Exam reveals no gallop and no friction rub.  No murmur heard. Pulmonary/Chest: Effort normal and breath sounds normal. No stridor. No respiratory distress. He has no wheezes. He has no rales. He exhibits no tenderness.  Abdominal: Soft. Bowel sounds are normal. He exhibits no distension and no mass. There is no tenderness. There is no rebound and no guarding. No hernia.  Musculoskeletal: Normal range of motion.  Neurological: He is alert and oriented to person, place, and time.  Skin: Skin is warm and intact. Capillary refill takes less than 2 seconds. No rash noted. He is not diaphoretic. No erythema. No pallor.  Psychiatric: He has a normal mood and affect. His speech is normal and behavior is normal. Judgment and thought content normal. Cognition and memory are normal.    Results for orders placed or performed in visit on 12/14/17  Microscopic Examination  Result Value Ref Range   WBC, UA 0-5 0 - 5 /hpf   RBC, UA 0-2 0 - 2 /hpf   Epithelial Cells (non renal) 0-10 0 - 10 /hpf   Bacteria, UA Few None seen/Few  Urine Culture, Reflex  Result Value Ref Range   Urine Culture, Routine Final report    Organism ID, Bacteria No growth   UA/M w/rflx Culture, Routine  Result Value Ref Range   Specific Gravity, UA 1.020 1.005 - 1.030   pH, UA 5.0 5.0 - 7.5   Color, UA Yellow Yellow   Appearance Ur Hazy (A) Clear   Leukocytes, UA 1+ (A) Negative   Protein, UA Negative Negative/Trace   Glucose, UA Negative Negative   Ketones, UA Negative Negative   RBC, UA Negative Negative    Bilirubin, UA Negative Negative   Urobilinogen, Ur 0.2 0.2 - 1.0 mg/dL   Nitrite, UA Negative Negative   Microscopic Examination See below:    Urinalysis Reflex Comment       Assessment & Plan:   Problem List Items Addressed This Visit      Genitourinary   Recurrent UTI - Primary    Due to incomplete emptying of bladder. Cysto did not show recurrence of cancer. Prostate mildly enlarged. Urology attempted to increase his flomax, but he cannot tolerate it, so he stopped taking it all together. He was supposed to follow up with urology today, but cancelled his appointment for unknown reason. Again discussed the importance of being able to fully empty his bladder. Will treat with abx for 2 weeks. New appointment with urology scheduled on 5/17- he should still have a weeks worth of abx at that time. Await culture and change abx as needed. Call with any concerns.        Other Visit Diagnoses    Burning with urination       + UA again   Relevant Orders   UA/M w/rflx Culture, Routine       Follow up plan: Return in about 3 weeks (around 01/11/2018).

## 2017-12-21 NOTE — Assessment & Plan Note (Signed)
Due to incomplete emptying of bladder. Cysto did not show recurrence of cancer. Prostate mildly enlarged. Urology attempted to increase his flomax, but he cannot tolerate it, so he stopped taking it all together. He was supposed to follow up with urology today, but cancelled his appointment for unknown reason. Again discussed the importance of being able to fully empty his bladder. Will treat with abx for 2 weeks. New appointment with urology scheduled on 5/17- he should still have a weeks worth of abx at that time. Await culture and change abx as needed. Call with any concerns.

## 2017-12-21 NOTE — Patient Instructions (Signed)
[facility] Address: [address], [address]st floor, [place], [place], [place] [address]  Phone: [phone] Friday, May 17 1:45PM

## 2017-12-23 LAB — MICROSCOPIC EXAMINATION: RBC MICROSCOPIC, UA: NONE SEEN /HPF (ref 0–2)

## 2017-12-23 LAB — UA/M W/RFLX CULTURE, ROUTINE
Bilirubin, UA: NEGATIVE
GLUCOSE, UA: NEGATIVE
Ketones, UA: NEGATIVE
NITRITE UA: NEGATIVE
PH UA: 5 (ref 5.0–7.5)
Protein, UA: NEGATIVE
Specific Gravity, UA: <1.005 — ABNORMAL LOW (ref 1.005–1.030)
Urobilinogen, Ur: 0.2 mg/dL (ref 0.2–1.0)

## 2017-12-23 LAB — URINE CULTURE, REFLEX: Organism ID, Bacteria: NO GROWTH

## 2017-12-26 DIAGNOSIS — H409 Unspecified glaucoma: Secondary | ICD-10-CM | POA: Diagnosis not present

## 2017-12-26 DIAGNOSIS — Z8551 Personal history of malignant neoplasm of bladder: Secondary | ICD-10-CM | POA: Diagnosis not present

## 2017-12-26 DIAGNOSIS — N390 Urinary tract infection, site not specified: Secondary | ICD-10-CM | POA: Diagnosis not present

## 2017-12-26 DIAGNOSIS — E785 Hyperlipidemia, unspecified: Secondary | ICD-10-CM | POA: Diagnosis not present

## 2017-12-26 DIAGNOSIS — R339 Retention of urine, unspecified: Secondary | ICD-10-CM | POA: Diagnosis not present

## 2017-12-26 DIAGNOSIS — E1140 Type 2 diabetes mellitus with diabetic neuropathy, unspecified: Secondary | ICD-10-CM | POA: Diagnosis not present

## 2017-12-26 DIAGNOSIS — Z794 Long term (current) use of insulin: Secondary | ICD-10-CM | POA: Diagnosis not present

## 2017-12-26 DIAGNOSIS — E1136 Type 2 diabetes mellitus with diabetic cataract: Secondary | ICD-10-CM | POA: Diagnosis not present

## 2017-12-26 DIAGNOSIS — I10 Essential (primary) hypertension: Secondary | ICD-10-CM | POA: Diagnosis not present

## 2017-12-28 ENCOUNTER — Encounter: Payer: Self-pay | Admitting: Urology

## 2017-12-28 ENCOUNTER — Ambulatory Visit (INDEPENDENT_AMBULATORY_CARE_PROVIDER_SITE_OTHER): Payer: Medicare Other | Admitting: Urology

## 2017-12-28 VITALS — BP 112/76 | HR 96 | Ht 69.0 in | Wt 167.0 lb

## 2017-12-28 DIAGNOSIS — Z8551 Personal history of malignant neoplasm of bladder: Secondary | ICD-10-CM

## 2017-12-28 DIAGNOSIS — R300 Dysuria: Secondary | ICD-10-CM

## 2017-12-28 DIAGNOSIS — N390 Urinary tract infection, site not specified: Secondary | ICD-10-CM | POA: Diagnosis not present

## 2017-12-28 LAB — BLADDER SCAN AMB NON-IMAGING

## 2017-12-28 LAB — URINALYSIS, COMPLETE
BILIRUBIN UA: NEGATIVE
GLUCOSE, UA: NEGATIVE
KETONES UA: NEGATIVE
NITRITE UA: NEGATIVE
Protein, UA: NEGATIVE
RBC UA: NEGATIVE
SPEC GRAV UA: 1.015 (ref 1.005–1.030)
Urobilinogen, Ur: 0.2 mg/dL (ref 0.2–1.0)
pH, UA: 5 (ref 5.0–7.5)

## 2017-12-28 LAB — MICROSCOPIC EXAMINATION: Epithelial Cells (non renal): NONE SEEN /hpf (ref 0–10)

## 2017-12-28 MED ORDER — SILODOSIN 8 MG PO CAPS: 8.0000 mg | ORAL_CAPSULE | Freq: Every day | ORAL | 2 refills | Status: DC

## 2017-12-28 NOTE — Progress Notes (Signed)
12/28/2017 3:54 PM   Rick [name] [birth year] [id number]  Referring provider: [name], DO [place], [place] [address]  Chief Complaint  Patient presents with  . Recurrent UTI    HPI: 77 year old male recently seen for recurrent urinary tract infections.  Recent cystoscopy showed evidence of incomplete bladder emptying and only mild prostate enlargement.  He saw his PCP last week complaining of dysuria.  Urinalysis showed 11-30 WBCs however a urine culture was negative.  He was started on amoxicillin.  At the time of cystoscopy it was recommended he increase his tamsulosin to 0.8 mg however he had side effects of dizziness and low blood pressure and is currently not taking this medication.  He complains of urinary hesitancy and decreased force and caliber of his urinary stream.  He states today he would like to have prostate surgery to help him empty his bladder.   PMH: Past Medical History:  Diagnosis Date  . Anxiety   . Bipolar disorder ([place])   . Bladder cancer ([place])   . BPH (benign prostatic hypertrophy)   . Chest pain, atypical 06/23/2015  . Depression   . Diabetes mellitus without complication ([place])   . Difficult or painful urination 10/31/2012  . Dysrhythmia   . Ear problem 03/24/2015  . Gangrenous appendicitis   . H/O urinary disorder 03/27/2013  . Herpes genitalis in men   . Hyperlipidemia   . Hypertension   . Insomnia   . Kidney stones   . Left hand pain 05/18/2015  . Mass of arm 02/10/2015  . Peripheral neuropathy   . Skin cancer   . UD (urethral discharge) 10/31/2012    Surgical History: Past Surgical History:  Procedure Laterality Date  . APPENDECTOMY     RUPTURED  . BLADDER SURGERY    . CHOLECYSTECTOMY    . COLOSTOMY     AND LATER CLOSURE  . FRACTURE SURGERY    . HERNIA REPAIR    . INNER EAR SURGERY Left   . ORIF ANKLE FRACTURE Left 09/27/2015   Procedure: OPEN REDUCTION INTERNAL FIXATION (ORIF) ANKLE FRACTURE;  Surgeon: Corky Mull,  MD;  Location: [facility] ORS;  Service: Orthopedics;  Laterality: Left;  . SKIN CANCER EXCISION    . VENTRAL HERNIA REPAIR N/A 01/14/2016   Procedure: HERNIA REPAIR VENTRAL ADULT;  Surgeon: Leonie Green, MD;  Location: [facility] ORS;  Service: General;  Laterality: N/A;    Home Medications:  Allergies as of 12/28/2017      Reactions   Haloperidol    Other reaction(s): Unknown   Sulfa Antibiotics    Other reaction(s): OTHER      Medication List        Accurate as of 12/28/17  3:54 PM. Always use your most recent med list.          amoxicillin 875 MG tablet Commonly known as:  AMOXIL Take 1 tablet (875 mg total) by mouth 2 (two) times daily.   carvedilol 6.25 MG tablet Commonly known as:  COREG Take 1 tablet (6.25 mg total) by mouth 2 (two) times daily with a meal.   diclofenac sodium 1 % Gel Commonly known as:  VOLTAREN Apply 2 g topically 4 (four) times daily.   enalapril 10 MG tablet Commonly known as:  VASOTEC Take 1 tablet (10 mg total) by mouth daily.   finasteride 5 MG tablet Commonly known as:  PROSCAR Take 1 tablet (5 mg total) by mouth daily.   fluticasone 50 MCG/ACT  nasal spray Commonly known as:  FLONASE Place 2 sprays into both nostrils 2 (two) times daily.   gabapentin 300 MG capsule Commonly known as:  NEURONTIN Take 2 capsules (600 mg total) by mouth 2 (two) times daily.   glipiZIDE 2.5 MG 24 hr tablet Commonly known as:  GLUCOTROL XL Take 1 tablet (2.5 mg total) by mouth daily with breakfast.   hydrochlorothiazide 25 MG tablet Commonly known as:  HYDRODIURIL TAKE ONE (1) TABLET EACH DAY   Insulin Glargine 100 UNIT/ML Solostar Pen Commonly known as:  LANTUS SOLOSTAR Inject 15 Units into the skin daily at 10 pm.   Insulin Pen Needle 29G X 12MM Misc Commonly known as:  ULTICARE PEN NEEDLES USE AS DIRECTED   lovastatin 40 MG tablet Commonly known as:  MEVACOR Take 1 tablet (40 mg total) by mouth daily.   metFORMIN 1000 MG tablet Commonly  known as:  GLUCOPHAGE TAKE 1 TABLET BY MOUTH 2 TIMES DAILY WITH A MEAL   mirtazapine 15 MG tablet Commonly known as:  REMERON Take 1 tablet (15 mg total) by mouth at bedtime.   ONE TOUCH ULTRA SYSTEM KIT w/Device Kit 1 kit by Does not apply route once.   ONE TOUCH ULTRA TEST test strip Generic drug:  glucose blood TEST BLOOD SUGAR THREE TIMES DAILY.   onetouch ultrasoft lancets USE TO TEST BLOOD SUGAR TWICE A DAY AS DIRECTED   pseudoephedrine 30 MG tablet Commonly known as:  SUDAFED Take 1 tablet (30 mg total) by mouth every 6 (six) hours as needed for congestion.   sildenafil 20 MG tablet Commonly known as:  REVATIO 2-5 tabs 1 hour prior to intercourse   traZODone 100 MG tablet Commonly known as:  DESYREL Take 1 tablet (100 mg total) by mouth at bedtime.   valACYclovir 1000 MG tablet Commonly known as:  VALTREX Take 1 tablet (1,000 mg total) by mouth daily as needed.       Allergies:  Allergies  Allergen Reactions  . Haloperidol     Other reaction(s): Unknown  . Sulfa Antibiotics     Other reaction(s): OTHER    Family History: Family History  Problem Relation Age of Onset  . Transient ischemic attack Mother   . Diabetes Father   . Cancer Brother     Social History:  reports that he quit smoking about 12 years ago. His smoking use included cigarettes. He started smoking about 23 years ago. He has a 22.50 pack-year smoking history. He quit smokeless tobacco use about 7 years ago. His smokeless tobacco use included snuff and chew. He reports that he does not drink alcohol or use drugs.  ROS: UROLOGY Frequent Urination?: No Hard to postpone urination?: No Burning/pain with urination?: No Get up at night to urinate?: No Leakage of urine?: No Urine stream starts and stops?: No Trouble starting stream?: No Do you have to strain to urinate?: No Blood in urine?: No Urinary tract infection?: No Sexually transmitted disease?: No Injury to kidneys or bladder?:  No Painful intercourse?: No Weak stream?: No Erection problems?: No Penile pain?: No  Gastrointestinal Nausea?: No Vomiting?: No Indigestion/heartburn?: No Diarrhea?: No Constipation?: No  Constitutional Fever: No Night sweats?: No Weight loss?: No  Skin Skin rash/lesions?: No Itching?: No  Eyes Blurred vision?: No Double vision?: No  Ears/Nose/Throat Sinus problems?: No  Hematologic/Lymphatic Swollen glands?: No Easy bruising?: No  Cardiovascular Leg swelling?: No Chest pain?: No  Respiratory Cough?: No Shortness of breath?: No  Endocrine Excessive thirst?: No  Musculoskeletal Back pain?: No Joint pain?: No  Neurological Headaches?: No Dizziness?: No  Psychologic Depression?: No Anxiety?: No  Physical Exam: BP 112/76 (BP Location: Left Arm, Patient Position: Sitting, Cuff Size: Normal)   Pulse 96   Ht _0  (1.753 m)   Wt 167 lb (75.8 kg)   SpO2 99%   BMI 24.66 kg/m   Constitutional:  Alert and oriented, No acute distress. HEENT: [place] AT, moist mucus membranes.  Trachea midline, no masses. Cardiovascular: No clubbing, cyanosis, or edema. Respiratory: Normal respiratory effort, no increased work of breathing. GI: Abdomen is soft, nontender, nondistended, no abdominal masses GU: No CVA tenderness Lymph: No cervical or inguinal lymphadenopathy. Skin: No rashes, bruises or suspicious lesions. Neurologic: Grossly intact, no focal deficits, moving all 4 extremities. Psychiatric: Normal mood and affect.  Laboratory Data: Lab Results  Component Value Date   WBC 7.7 12/06/2017   HGB 14.6 12/06/2017   HCT 43.5 12/06/2017   MCV 85 12/06/2017   PLT 310 12/06/2017    Lab Results  Component Value Date   CREATININE 1.16 12/06/2017    Urinalysis Dipstick 2+ leukocytes; microscopy 6-10 WBC   Assessment & Plan:   PVR by bladder scan today was > 300 mL which is the most likely source of his pyuria.  Recent urine culture was negative and was  repeated today.  Cystoscopy showed only mild prostate enlargement and his incomplete bladder emptying may be secondary to bladder hypotonicity.  This could be related to his diabetes.  I recommended trying silodosin which will have less side effects of lightheadedness.  I also discussed a urodynamic study to evaluate for bladder hypertonicity.  Would not recommend any type of outlet surgery until a urodynamic study was performed.    Abbie Sons, [place] [address] [place], [place] [address] [phone]

## 2017-12-30 ENCOUNTER — Encounter: Payer: Self-pay | Admitting: Urology

## 2018-01-01 DIAGNOSIS — E785 Hyperlipidemia, unspecified: Secondary | ICD-10-CM | POA: Diagnosis not present

## 2018-01-01 DIAGNOSIS — I10 Essential (primary) hypertension: Secondary | ICD-10-CM | POA: Diagnosis not present

## 2018-01-01 DIAGNOSIS — Z794 Long term (current) use of insulin: Secondary | ICD-10-CM | POA: Diagnosis not present

## 2018-01-01 DIAGNOSIS — H409 Unspecified glaucoma: Secondary | ICD-10-CM | POA: Diagnosis not present

## 2018-01-01 DIAGNOSIS — R339 Retention of urine, unspecified: Secondary | ICD-10-CM | POA: Diagnosis not present

## 2018-01-01 DIAGNOSIS — Z8551 Personal history of malignant neoplasm of bladder: Secondary | ICD-10-CM | POA: Diagnosis not present

## 2018-01-01 DIAGNOSIS — E1136 Type 2 diabetes mellitus with diabetic cataract: Secondary | ICD-10-CM | POA: Diagnosis not present

## 2018-01-01 DIAGNOSIS — E1140 Type 2 diabetes mellitus with diabetic neuropathy, unspecified: Secondary | ICD-10-CM | POA: Diagnosis not present

## 2018-01-01 DIAGNOSIS — N390 Urinary tract infection, site not specified: Secondary | ICD-10-CM | POA: Diagnosis not present

## 2018-01-02 ENCOUNTER — Encounter: Payer: Self-pay | Admitting: Family Medicine

## 2018-01-02 ENCOUNTER — Telehealth: Payer: Self-pay | Admitting: Radiology

## 2018-01-02 ENCOUNTER — Telehealth: Payer: Self-pay | Admitting: Urology

## 2018-01-02 ENCOUNTER — Ambulatory Visit (INDEPENDENT_AMBULATORY_CARE_PROVIDER_SITE_OTHER): Payer: Medicare Other | Admitting: Family Medicine

## 2018-01-02 VITALS — BP 152/88 | HR 70 | Temp 97.9°F | Wt 168.0 lb

## 2018-01-02 DIAGNOSIS — N411 Chronic prostatitis: Secondary | ICD-10-CM

## 2018-01-02 DIAGNOSIS — N476 Balanoposthitis: Secondary | ICD-10-CM

## 2018-01-02 DIAGNOSIS — N529 Male erectile dysfunction, unspecified: Secondary | ICD-10-CM | POA: Diagnosis not present

## 2018-01-02 DIAGNOSIS — C679 Malignant neoplasm of bladder, unspecified: Secondary | ICD-10-CM

## 2018-01-02 DIAGNOSIS — C669 Malignant neoplasm of unspecified ureter: Secondary | ICD-10-CM | POA: Diagnosis not present

## 2018-01-02 DIAGNOSIS — N200 Calculus of kidney: Secondary | ICD-10-CM

## 2018-01-02 DIAGNOSIS — N390 Urinary tract infection, site not specified: Secondary | ICD-10-CM | POA: Diagnosis not present

## 2018-01-02 DIAGNOSIS — N401 Enlarged prostate with lower urinary tract symptoms: Secondary | ICD-10-CM

## 2018-01-02 DIAGNOSIS — R3914 Feeling of incomplete bladder emptying: Secondary | ICD-10-CM

## 2018-01-02 DIAGNOSIS — A6001 Herpesviral infection of penis: Secondary | ICD-10-CM

## 2018-01-02 DIAGNOSIS — H6981 Other specified disorders of Eustachian tube, right ear: Secondary | ICD-10-CM

## 2018-01-02 DIAGNOSIS — Z87440 Personal history of urinary (tract) infections: Secondary | ICD-10-CM

## 2018-01-02 DIAGNOSIS — R339 Retention of urine, unspecified: Secondary | ICD-10-CM

## 2018-01-02 DIAGNOSIS — N400 Enlarged prostate without lower urinary tract symptoms: Secondary | ICD-10-CM | POA: Diagnosis not present

## 2018-01-02 DIAGNOSIS — H6991 Unspecified Eustachian tube disorder, right ear: Secondary | ICD-10-CM

## 2018-01-02 LAB — MICROSCOPIC EXAMINATION

## 2018-01-02 MED ORDER — FLUTICASONE PROPIONATE 50 MCG/ACT NA SUSP: 2.0000 | Freq: Two times a day (BID) | NASAL | 6 refills | Status: DC

## 2018-01-02 NOTE — Assessment & Plan Note (Signed)
3+ leuks today. Await culture. Treat as needed. Discussed again that his bladder is not emptying. Surgery wouldn't work. Needs to have urodynamic testing. There are treatments. Will follow up with Dr. Elenor Quinones for urodynamic testing. Would like 2nd opinion. Would only like to see male urologist. Dr. Jacqlyn Larsen is retiring. Referral placed to [facility], but cannot see another male provider until August. He is aware.

## 2018-01-02 NOTE — Telephone Encounter (Signed)
Attempted to call patient.  No answer. Unable to LM.

## 2018-01-02 NOTE — Patient Instructions (Signed)
Clean Intermittent Catheterization, Male  Clean intermittent catheterization (CIC) is a procedure to remove urine from the bladder by placing a small, flexible tube (catheter) into the bladder though the urethra. The urethra is a tube in the body that carries urine from the bladder out of the body.  CIC may be done when:  · You cannot completely empty your bladder on your own. This may be due to a blockage in the bladder or urethra.  · Your bladder leaks urine. This may happen when the muscles or nerves near the bladder are not working normally, so the bladder overflows.    Your health care provider will show you how to perform CIC and will help you to feel comfortable performing this procedure at home. Your health care provider will also help you to get the home care supplies that are needed for this procedure.  What supplies will I need?  · Germ-free (sterile), water-based lubricant.  · A container for urine collection. You may also use the toilet to dispose of urine from the catheter.  · A catheter. Your health care provider will determine the best size for you.  · Sterile gloves.  · Sterile gauze.  · Medicated sterile swabs.  How do I perform the procedure?  Most people need CIC at least 4 times per day to adequately empty the bladder. Your health care provider will tell you how often you should perform CIC.  To perform CIC, follow these steps:  1. Wash your hands with soap and water. If soap and water are not available, use hand sanitizer.  2. Prepare the supplies that you will use during the procedure. Open the catheter pack, the lubricant, and the pack of medicated sterile swabs. If you have been told to keep the procedure sterile, do not touch your supplies until you are wearing gloves.  3. Get in a comfortable position. Possible positions include:  ? Sitting on a toilet, a chair, or the edge of a bed.  ? Standing near a toilet.  ? Lying down with your head raised on pillows and your knees pointing to  the ceiling.  4. If you are using a urine collection container, position it between your legs.  5. Urinate, if you are able.  6. Put on gloves.  7. Apply lubricant to about 2 inches (5 cm) of the tip of the catheter.  8. Set the catheter down on a clean, dry surface within reach.  9. Gently stretch your penis out from your body. Pull back any skin that covers the end of your penis (foreskin). Clean the end of your penis with medicated sterile swabs as told by your health care provider.  10. Hold your penis upward at a 45–60 degree angle. This helps to straighten the urethra.  11. Slowly insert the lubricated catheter 2–3 inches (5–8 cm) straight into your urethra until urine flows freely. Allow urine to drain into the toilet or the urine collection container.  12. When urine starts to flow freely, insert the catheter 1 inch (3 cm) more.  13. When urine stops flowing, slowly remove the catheter.  14. Note the color, amount, and odor of the urine.  15. Clean your penis using soap and water.  16. Wash your hands with soap and water.  17. Follow package instructions about how to clean the catheter after each use.    What should I do at home?  How Often Should I Perform CIC?  · Do CIC to empty your bladder every   4–6 hours or as often as told by your health care provider.  · If you have symptoms of too much urine in your bladder (overdistension) and you are not able to urinate, perform CIC. Symptoms of overdistension may include:  ? Restlessness.  ? Sweating or chills.  ? Headache.  ? Flushed or pale skin.  ? Cold limbs.  ? Bloated lower abdomen.  What Are Some Steps That I Can Take to Avoid Problems?  · Drink enough fluid to keep your urine clear or pale yellow.  · Avoid caffeine. Caffeine may make you urinate more frequently and more urgently.  · Dispose of a multiple use catheter when it becomes dry, brittle, or cloudy. This usually happens after you use the catheter for 1 week.   · Take over-the-counter and prescription medicines only as told by your health care provider.  · Keep all follow-up visits as told by your health care provider. This is important.  Contact a health care provider if:  · You have difficulty performing CIC.  · You have urine leaking during CIC.  · You have:  ? Dark or cloudy urine.  ? Blood in your urine or in your catheter.  ? A change in the smell of your urine or discharge.  ? A burning feeling while you urinate.  · You feel nauseous or you vomit.  · You have pain in your abdomen, your back, or your sides below your ribs.  · You have swelling or redness around the opening of your urethra.  · You develop a rash or sores on your skin.  Get help right away if:  · You have a fever.  · You have symptoms that do not go away after 3 days.  · You have symptoms that suddenly get worse.  · You have severe pain.  · The amount of urine that drains from your bladder decreases.  This information is not intended to replace advice given to you by your health care provider. Make sure you discuss any questions you have with your health care provider.  Document Released: 09/02/2010 Document Revised: 01/06/2016 Document Reviewed: 02/12/2015  Elsevier Interactive Patient Education © 2018 [employer]

## 2018-01-02 NOTE — Telephone Encounter (Signed)
thanks

## 2018-01-02 NOTE — Telephone Encounter (Signed)
I will call him with the urodynamic results.  It looks like he requested a second opinion and they know Jacqlyn Larsen is leaving.  He cannot be seen at [facility] until August.

## 2018-01-02 NOTE — Telephone Encounter (Signed)
FYI Patient's PCP called and requested that he have UDS done, I saw where you wanted this done, so I put the referral in and sent it to [facility]. They have also referred him to [facility]? Not sure if they realize that he is leaving? Anyway, do you want him back after the UDS?  Sharyn Lull

## 2018-01-02 NOTE — Progress Notes (Signed)
BP (!) 152/88 (BP Location: Right Arm, Patient Position: Sitting, Cuff Size: Normal)   Pulse 70   Temp 97.9 F (36.6 C)   Wt 168 lb (76.2 kg)   SpO2 100%   BMI 24.81 kg/m    Subjective:    Patient ID: Rick [name], male    DOB: [birth year], 77 y.o.   MRN: [id number]  HPI: Rick [name] is a 77 y.o. male  Chief Complaint  Patient presents with  . Dysuria    Patient states that he would like a second opinion   Patient went to see Urology last visit. Notes that prostate is not very enlarged, his issue seems to be that his bladder is hypotonic. Wanted to do urodynamic studies to see what needs to be done. Discussion with Marden Noble today. He is very concerned that he has a cyst in his bladder. He does not believe his urologist's report on his cystoscopy. He thinks that he may have a cyst or recurrent bladder cancer. Discussed that his results were normal on the cystoscope and that his prostate didn't seem that enlarged. He does not believe that and would like someone to do the cystoscope "slowly and thoroughly" while letting him watch on the screen. He is having issues with his urine again. He does not think the amoxicillin cleared it up this time. He is peeing much more often. He is having burning when he pees. He is not feeling better.  He is anxious about needing to go to [facility] to do urodynamic testing. He doesn't want to have to drive outside of [place] [name] [place] [employer]. Discussed that there is nowhere that does urodynamic testing in [facility], and that he would have to go to either [place], [place] or [facility] to have it done.   EAG CLOGGED Duration: 1 week Involved ear(s):  "right Sensation of feeling clogged/plugged: yes Decreased/muffled hearing:yes Ear pain: no Fever: no Otorrhea: no Hearing loss: yes Upper respiratory infection symptoms: no Using Q-Tips: no Status: worse History of cerumenosis: yes Treatments attempted: none  Relevant past medical, surgical, family and  social history reviewed and updated as indicated. Interim medical history since our last visit reviewed. Allergies and medications reviewed and updated.  Review of Systems  Constitutional: Negative.   Respiratory: Negative.   Cardiovascular: Negative.   Genitourinary: Positive for dysuria, frequency and urgency. Negative for decreased urine volume, difficulty urinating, discharge, enuresis, flank pain, genital sores, hematuria, penile pain, penile swelling, scrotal swelling and testicular pain.  Musculoskeletal: Negative.   Neurological: Negative.   Psychiatric/Behavioral: Negative.     Per HPI unless specifically indicated above     Objective:    BP (!) 152/88 (BP Location: Right Arm, Patient Position: Sitting, Cuff Size: Normal)   Pulse 70   Temp 97.9 F (36.6 C)   Wt 168 lb (76.2 kg)   SpO2 100%   BMI 24.81 kg/m   Wt Readings from Last 3 Encounters:  01/02/18 168 lb (76.2 kg)  12/28/17 167 lb (75.8 kg)  12/21/17 170 lb 11.2 oz (77.4 kg)    Physical Exam  Constitutional: He is oriented to person, place, and time. He appears well-developed and well-nourished. No distress.  HENT:  Head: Normocephalic and atraumatic.  Right Ear: Hearing normal.  Left Ear: Hearing normal.  Nose: Nose normal.  Eyes: Conjunctivae and lids are normal. Right eye exhibits no discharge. Left eye exhibits no discharge. No scleral icterus.  Cardiovascular: Normal rate, regular rhythm, normal heart sounds and intact distal pulses. Exam reveals  no gallop and no friction rub.  No murmur heard. Pulmonary/Chest: Effort normal and breath sounds normal. No stridor. No respiratory distress. He has no wheezes. He has no rales. He exhibits no tenderness.  Musculoskeletal: Normal range of motion.  Neurological: He is alert and oriented to person, place, and time.  Skin: Skin is warm, dry and intact. Capillary refill takes less than 2 seconds. No rash noted. He is not diaphoretic. No erythema. No pallor.    Psychiatric: He has a normal mood and affect. His speech is normal and behavior is normal. Judgment and thought content normal. Cognition and memory are normal.  Nursing note and vitals reviewed.   Results for orders placed or performed in visit on 12/28/17  CULTURE, URINE COMPREHENSIVE  Result Value Ref Range   Urine Culture, Comprehensive Preliminary report    Organism ID, Bacteria Comment   Microscopic Examination  Result Value Ref Range   WBC, UA 6-10 (A) 0 - 5 /hpf   RBC, UA 0-2 0 - 2 /hpf   Epithelial Cells (non renal) None seen 0 - 10 /hpf   Mucus, UA Present (A) Not Estab.   Bacteria, UA Few (A) None seen/Few  Urinalysis, Complete  Result Value Ref Range   Specific Gravity, UA 1.015 1.005 - 1.030   pH, UA 5.0 5.0 - 7.5   Color, UA Yellow Yellow   Appearance Ur Cloudy (A) Clear   Leukocytes, UA 2+ (A) Negative   Protein, UA Negative Negative/Trace   Glucose, UA Negative Negative   Ketones, UA Negative Negative   RBC, UA Negative Negative   Bilirubin, UA Negative Negative   Urobilinogen, Ur 0.2 0.2 - 1.0 mg/dL   Nitrite, UA Negative Negative   Microscopic Examination See below:   Bladder Scan (Post Void Residual) in office  Result Value Ref Range   Scan Result >335mL       Assessment & Plan:   Problem List Items Addressed This Visit      Genitourinary   Balanoposthitis   Relevant Orders   Ambulatory referral to Urology   Benign prostatic hyperplasia with incomplete bladder emptying   Relevant Orders   Ambulatory referral to Urology   Malignant neoplasm of urinary bladder [facility])   Relevant Orders   Ambulatory referral to Urology   Benign fibroma of prostate   Relevant Orders   Ambulatory referral to Urology   Chronic prostatitis   Relevant Orders   Ambulatory referral to Urology   Herpesviral infection of penis   Relevant Orders   Ambulatory referral to Urology   ED (erectile dysfunction) of organic origin   Relevant Orders   Ambulatory referral to  Urology   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 Await culture. Treat as needed. Discussed again that his bladder is not emptying. Surgery wouldn't work. Needs to have urodynamic testing. There are treatments. Will follow up with Dr. Elenor Quinones for urodynamic testing. Would like 2nd opinion. Would only like to see male urologist. Dr. Jacqlyn Larsen is retiring. Referral placed to [facility], but cannot see another male provider until August. He is aware.       Relevant Orders   Ambulatory referral to Urology     Other   Incomplete bladder emptying    3+ leuks today. Await culture. Treat as needed. Discussed again that  his bladder is not emptying. Surgery wouldn't work. Needs to have urodynamic testing. There are treatments. Will follow up with Dr. Elenor Quinones for urodynamic testing. Would like 2nd opinion. Would only like to see male urologist. Dr. Jacqlyn Larsen is retiring. Referral placed to [facility], but cannot see another male provider until August. He is aware.       Relevant Orders   Ambulatory referral to Urology    Other Visit Diagnoses    History of recurrent UTIs    -  Primary   Relevant Orders   UA/M w/rflx Culture, Routine   Ambulatory referral to Urology   Dysfunction of right eustachian tube       Will treat with flonase. Rx sent to his pharmacy. Call with any concerns.    Relevant Orders   Ambulatory referral to Urology       Follow up plan: Return 10 days.  Greater than 45 minutes of a 15 minute appointment spent in counseling and coordination of care today.

## 2018-01-02 NOTE — Telephone Encounter (Signed)
Discussed Dr Dene Gentry note below. Pt again asks about results of ucx done 12/28/2017. Explained they are pending & he will be notified once results are available. Explained reason for urodynamic study & answered questions related to the exam. Pt would like to proceed. Advised pt that he will be contacted by [facility] to schedule an appointment & Dr Bernardo Heater will call with results. Pt voices understanding of entire conversation.

## 2018-01-02 NOTE — Assessment & Plan Note (Signed)
3+ leuks today. Await culture. Treat as needed. Discussed again that his bladder is not emptying. Surgery wouldn't work. Needs to have urodynamic testing. There are treatments. Will follow up with Dr. Elenor Quinones for urodynamic testing. Would like 2nd opinion. Would only like to see male urologist. Dr. Jacqlyn Larsen is retiring. Referral placed to [facility], but cannot see another male provider until August. He is aware.

## 2018-01-02 NOTE — Telephone Encounter (Signed)
-----   Message from Abbie Sons, MD sent at 01/01/2018  6:52 PM EDT ----- Regarding: RE: Patient Questions I do not do prostate secretions because they are not reliable.  If he wants this done he will have to go to another practice. I discussed with him last week that his infections are most likely to incomplete bladder emptying.  This may be secondary to BPH or decreased bladder tone and that is why I recommended the urodynamic study.  If he wants to proceed with this study then can schedule. ----- Message ----- From: Ranell Patrick, RN Sent: 01/01/2018  11:16 AM To: Abbie Sons, MD Subject: Patient Questions                              Pt has consulted his [facility] wants to have prostate secretions examined under a microscope. He is frustrated about recurrent UTIs & wants answers.  Your last office note mentions a UDS. Do you want this to be scheduled?

## 2018-01-03 LAB — CULTURE, URINE COMPREHENSIVE

## 2018-01-04 ENCOUNTER — Other Ambulatory Visit: Payer: Self-pay | Admitting: Family Medicine

## 2018-01-04 ENCOUNTER — Telehealth: Payer: Self-pay | Admitting: Family Medicine

## 2018-01-04 LAB — UA/M W/RFLX CULTURE, ROUTINE
BILIRUBIN UA: NEGATIVE
GLUCOSE, UA: NEGATIVE
KETONES UA: NEGATIVE
Nitrite, UA: NEGATIVE
Protein, UA: NEGATIVE
Urobilinogen, Ur: 0.2 mg/dL (ref 0.2–1.0)
pH, UA: 5 (ref 5.0–7.5)

## 2018-01-04 LAB — URINE CULTURE, REFLEX

## 2018-01-04 MED ORDER — PHENAZOPYRIDINE HCL 100 MG PO TABS: 100.0000 mg | ORAL_TABLET | Freq: Three times a day (TID) | ORAL | 0 refills | Status: DC | PRN

## 2018-01-04 NOTE — Telephone Encounter (Signed)
Patient notified

## 2018-01-04 NOTE — Telephone Encounter (Signed)
Can you please call him and let him know that there is not a major bacteria growing right now, So I'm going to give him medicine for his burning. It should help. I'll see him as scheduled. Thanks!

## 2018-01-08 ENCOUNTER — Telehealth: Payer: Self-pay | Admitting: Family Medicine

## 2018-01-08 ENCOUNTER — Ambulatory Visit: Payer: Medicare Other | Admitting: Urology

## 2018-01-08 DIAGNOSIS — C679 Malignant neoplasm of bladder, unspecified: Secondary | ICD-10-CM

## 2018-01-08 DIAGNOSIS — E1136 Type 2 diabetes mellitus with diabetic cataract: Secondary | ICD-10-CM | POA: Diagnosis not present

## 2018-01-08 DIAGNOSIS — N400 Enlarged prostate without lower urinary tract symptoms: Secondary | ICD-10-CM

## 2018-01-08 DIAGNOSIS — C669 Malignant neoplasm of unspecified ureter: Secondary | ICD-10-CM

## 2018-01-08 DIAGNOSIS — R339 Retention of urine, unspecified: Secondary | ICD-10-CM

## 2018-01-08 DIAGNOSIS — H409 Unspecified glaucoma: Secondary | ICD-10-CM | POA: Diagnosis not present

## 2018-01-08 DIAGNOSIS — I10 Essential (primary) hypertension: Secondary | ICD-10-CM | POA: Diagnosis not present

## 2018-01-08 DIAGNOSIS — E1140 Type 2 diabetes mellitus with diabetic neuropathy, unspecified: Secondary | ICD-10-CM | POA: Diagnosis not present

## 2018-01-08 DIAGNOSIS — N390 Urinary tract infection, site not specified: Secondary | ICD-10-CM | POA: Diagnosis not present

## 2018-01-08 DIAGNOSIS — N476 Balanoposthitis: Secondary | ICD-10-CM

## 2018-01-08 DIAGNOSIS — N200 Calculus of kidney: Secondary | ICD-10-CM

## 2018-01-08 DIAGNOSIS — N411 Chronic prostatitis: Secondary | ICD-10-CM

## 2018-01-08 DIAGNOSIS — A6001 Herpesviral infection of penis: Secondary | ICD-10-CM

## 2018-01-08 DIAGNOSIS — Z8551 Personal history of malignant neoplasm of bladder: Secondary | ICD-10-CM | POA: Diagnosis not present

## 2018-01-08 DIAGNOSIS — E785 Hyperlipidemia, unspecified: Secondary | ICD-10-CM | POA: Diagnosis not present

## 2018-01-08 DIAGNOSIS — N529 Male erectile dysfunction, unspecified: Secondary | ICD-10-CM

## 2018-01-08 DIAGNOSIS — Z794 Long term (current) use of insulin: Secondary | ICD-10-CM | POA: Diagnosis not present

## 2018-01-08 NOTE — Telephone Encounter (Signed)
One touch test strips refill Last OV: 12/06/17 Last Refill:02/06/17 #100 12 RF Pharmacy:[facility] PCP: Park Liter DO

## 2018-01-08 NOTE — Telephone Encounter (Signed)
Upset about his urine. Does not want to go back to see Dr. Elenor Quinones. Would like to see [facility]. Referral generated today.

## 2018-01-09 ENCOUNTER — Ambulatory Visit: Payer: Medicare Other | Admitting: Family Medicine

## 2018-01-15 ENCOUNTER — Encounter: Payer: Self-pay | Admitting: Family Medicine

## 2018-01-15 ENCOUNTER — Ambulatory Visit (INDEPENDENT_AMBULATORY_CARE_PROVIDER_SITE_OTHER): Payer: Medicare Other | Admitting: Family Medicine

## 2018-01-15 VITALS — BP 138/83 | HR 78 | Temp 97.8°F | Wt 167.1 lb

## 2018-01-15 DIAGNOSIS — R339 Retention of urine, unspecified: Secondary | ICD-10-CM | POA: Diagnosis not present

## 2018-01-15 DIAGNOSIS — E1140 Type 2 diabetes mellitus with diabetic neuropathy, unspecified: Secondary | ICD-10-CM | POA: Diagnosis not present

## 2018-01-15 DIAGNOSIS — Z794 Long term (current) use of insulin: Secondary | ICD-10-CM | POA: Diagnosis not present

## 2018-01-15 DIAGNOSIS — N390 Urinary tract infection, site not specified: Secondary | ICD-10-CM | POA: Diagnosis not present

## 2018-01-15 DIAGNOSIS — E785 Hyperlipidemia, unspecified: Secondary | ICD-10-CM | POA: Diagnosis not present

## 2018-01-15 DIAGNOSIS — E1136 Type 2 diabetes mellitus with diabetic cataract: Secondary | ICD-10-CM | POA: Diagnosis not present

## 2018-01-15 DIAGNOSIS — I10 Essential (primary) hypertension: Secondary | ICD-10-CM | POA: Diagnosis not present

## 2018-01-15 DIAGNOSIS — Z8551 Personal history of malignant neoplasm of bladder: Secondary | ICD-10-CM | POA: Diagnosis not present

## 2018-01-15 DIAGNOSIS — H409 Unspecified glaucoma: Secondary | ICD-10-CM | POA: Diagnosis not present

## 2018-01-15 LAB — MICROSCOPIC EXAMINATION: WBC, UA: >30 /hpf — AB (ref 0–5)

## 2018-01-15 LAB — UA/M W/RFLX CULTURE, ROUTINE
BILIRUBIN UA: NEGATIVE
Ketones, UA: NEGATIVE
Nitrite, UA: POSITIVE — AB
PH UA: 5 (ref 5.0–7.5)
Specific Gravity, UA: 1.01 (ref 1.005–1.030)
Urobilinogen, Ur: 1 mg/dL (ref 0.2–1.0)

## 2018-01-15 NOTE — Assessment & Plan Note (Signed)
Urine dirty again today. Await culture. Call with any concerns. To be seeing urology next week. Encouraged taking pyridium to help with symptoms until he sees urology.

## 2018-01-15 NOTE — Progress Notes (Signed)
BP 138/83 (BP Location: Right Arm, Patient Position: Sitting, Cuff Size: Normal)   Pulse 78   Temp 97.8 F (36.6 C) (Oral)   Wt 167 lb 2 oz (75.8 kg)   SpO2 100%   BMI 24.68 kg/m    Subjective:    Patient ID: [name], male    DOB: [birth year], 77 y.o.   MRN: [id number]  HPI: Rick [name] is a 77 y.o. male  Chief Complaint  Patient presents with  . Urinary Tract Infection   Here today for follow up on recurrent UTIs. Has cancelled his last appointment with Dr. Elenor Quinones. Does not want to go back to see him. Referral was generated to go to [facility], but he would like to see a male doctor, and they do not have one available until August. We have also gotten a referral for him to see [facility]- he has an appointment to see Dr. Gloriann Loan on 01/24/18.  He notes that he continues with pain when he urinates. Has been having burning and frequency. Is very frustrated about this. Is very concerned that his prostate is enlarged. Does not believe that his bladder is hypotonic. Would like another cystoscope. Would like a 2nd opinion. Otherwise doing OK today.  Relevant past medical, surgical, family and social history reviewed and updated as indicated. Interim medical history since our last visit reviewed. Allergies and medications reviewed and updated.  Review of Systems  Constitutional: Negative.   Respiratory: Negative.   Cardiovascular: Negative.   Genitourinary: Positive for difficulty urinating, dysuria, frequency and urgency. Negative for decreased urine volume, discharge, enuresis, flank pain, genital sores, hematuria, penile pain, penile swelling, scrotal swelling and testicular pain.  Musculoskeletal: Negative.   Skin: Negative.   Neurological: Negative.   Psychiatric/Behavioral: Negative.     Per HPI unless specifically indicated above     Objective:    BP 138/83 (BP Location: Right Arm, Patient Position: Sitting, Cuff Size: Normal)   Pulse 78   Temp 97.8 F (36.6 C)  (Oral)   Wt 167 lb 2 oz (75.8 kg)   SpO2 100%   BMI 24.68 kg/m   Wt Readings from Last 3 Encounters:  01/15/18 167 lb 2 oz (75.8 kg)  01/02/18 168 lb (76.2 kg)  12/28/17 167 lb (75.8 kg)    Physical Exam  Constitutional: He is oriented to person, place, and time. He appears well-developed and well-nourished. No distress.  HENT:  Head: Normocephalic and atraumatic.  Right Ear: Hearing normal.  Left Ear: Hearing normal.  Nose: Nose normal.  Eyes: Conjunctivae and lids are normal. Right eye exhibits no discharge. Left eye exhibits no discharge. No scleral icterus.  Cardiovascular: Normal rate, regular rhythm, normal heart sounds and intact distal pulses. Exam reveals no gallop and no friction rub.  No murmur heard. Pulmonary/Chest: Effort normal and breath sounds normal. No stridor. No respiratory distress. He has no wheezes. He has no rales. He exhibits no tenderness.  Musculoskeletal: Normal range of motion.  Neurological: He is alert and oriented to person, place, and time.  Skin: Skin is warm, dry and intact. Capillary refill takes less than 2 seconds. No rash noted. He is not diaphoretic. No erythema. No pallor.  Psychiatric: He has a normal mood and affect. His speech is normal and behavior is normal. Judgment and thought content normal. Cognition and memory are normal.  Nursing note and vitals reviewed.   Results for orders placed or performed in visit on 01/02/18  Microscopic Examination  Result Value  Ref Range   WBC, UA >30 (A) 0 - 5 /hpf   RBC, UA 0-2 0 - 2 /hpf   Epithelial Cells (non renal) CANCELED    Renal Epithel, UA 0-10 (A) None seen /hpf   Bacteria, UA Moderate (A) None seen/Few  Urine Culture, Reflex  Result Value Ref Range   Urine Culture, Routine Final report    Organism ID, Bacteria Comment   UA/M w/rflx Culture, Routine  Result Value Ref Range   Specific Gravity, UA <1.005 (L) 1.005 - 1.030   pH, UA 5.0 5.0 - 7.5   Color, UA Straw Yellow   Appearance  Ur Turbid (A) Clear   Leukocytes, UA 3+ (A) Negative   Protein, UA Negative Negative/Trace   Glucose, UA Negative Negative   Ketones, UA Negative Negative   RBC, UA 1+ (A) Negative   Bilirubin, UA Negative Negative   Urobilinogen, Ur 0.2 0.2 - 1.0 mg/dL   Nitrite, UA Negative Negative   Microscopic Examination See below:    Urinalysis Reflex Comment       Assessment & Plan:   Problem List Items Addressed This Visit      Genitourinary   Recurrent UTI - Primary    Urine dirty again today. Await culture. Call with any concerns. To be seeing urology next week. Encouraged taking pyridium to help with symptoms until he sees urology.       Relevant Orders   UA/M w/rflx Culture, Routine   Urine Culture       Follow up plan: Return in about 3 weeks (around 02/05/2018) for follow up.   Greater than 30 minutes spent in consultation and coordination of care.

## 2018-01-18 ENCOUNTER — Telehealth: Payer: Self-pay | Admitting: Family Medicine

## 2018-01-18 ENCOUNTER — Telehealth: Payer: Self-pay | Admitting: Urology

## 2018-01-18 LAB — URINE CULTURE

## 2018-01-18 MED ORDER — AMOXICILLIN 875 MG PO TABS
875.0000 mg | ORAL_TABLET | Freq: Two times a day (BID) | ORAL | 0 refills | Status: DC
Start: 2018-01-18 — End: 2018-02-05

## 2018-01-18 NOTE — Telephone Encounter (Signed)
Please let him know that his urine grew out bacteria this time, So I've sent him a new rx to his pharmacy

## 2018-01-18 NOTE — Telephone Encounter (Signed)
Patient called and wants to know if his cancer can come back because he is having the same symptoms as before with frequent urination and burning? He wants a call back to discuss this.  Sharyn Lull

## 2018-01-18 NOTE — Telephone Encounter (Signed)
Patient has decided to transfer care to Dr. Gloriann Loan at [facility].  No need to return the call at this time.

## 2018-01-18 NOTE — Telephone Encounter (Signed)
Patient notified

## 2018-01-21 ENCOUNTER — Telehealth: Payer: Self-pay | Admitting: *Deleted

## 2018-01-21 NOTE — Telephone Encounter (Signed)
Called as previously requested by patient to discuss lung screening scan. Patient report he is very busy with other health issues and wants me to contact him in 6 months.

## 2018-01-23 NOTE — Telephone Encounter (Signed)
He has been abusive to staff on multiple occasions.  Since he has transferred care to a different practice would make him not eligible for further follow-up here.

## 2018-01-24 DIAGNOSIS — R311 Benign essential microscopic hematuria: Secondary | ICD-10-CM | POA: Diagnosis not present

## 2018-01-24 DIAGNOSIS — C669 Malignant neoplasm of unspecified ureter: Secondary | ICD-10-CM | POA: Diagnosis not present

## 2018-01-24 DIAGNOSIS — N3020 Other chronic cystitis without hematuria: Secondary | ICD-10-CM | POA: Diagnosis not present

## 2018-02-05 ENCOUNTER — Encounter: Payer: Self-pay | Admitting: Family Medicine

## 2018-02-05 ENCOUNTER — Telehealth: Payer: Self-pay | Admitting: Family Medicine

## 2018-02-05 ENCOUNTER — Ambulatory Visit (INDEPENDENT_AMBULATORY_CARE_PROVIDER_SITE_OTHER): Payer: Medicare Other | Admitting: Family Medicine

## 2018-02-05 VITALS — BP 128/66 | HR 64 | Ht 66.0 in | Wt 171.9 lb

## 2018-02-05 DIAGNOSIS — N390 Urinary tract infection, site not specified: Secondary | ICD-10-CM

## 2018-02-05 DIAGNOSIS — H9191 Unspecified hearing loss, right ear: Secondary | ICD-10-CM

## 2018-02-05 LAB — MICROSCOPIC EXAMINATION: RBC, UA: NONE SEEN /hpf (ref 0–2)

## 2018-02-05 LAB — UA/M W/RFLX CULTURE, ROUTINE
BILIRUBIN UA: NEGATIVE
Glucose, UA: NEGATIVE
KETONES UA: NEGATIVE
Nitrite, UA: NEGATIVE
PROTEIN UA: NEGATIVE
RBC UA: NEGATIVE
Urobilinogen, Ur: 0.2 mg/dL (ref 0.2–1.0)
pH, UA: 5 (ref 5.0–7.5)

## 2018-02-05 MED ORDER — PREDNISONE 50 MG PO TABS: 50.0000 mg | ORAL_TABLET | Freq: Every day | ORAL | 0 refills | Status: DC

## 2018-02-05 NOTE — Telephone Encounter (Signed)
Message relayed to patient. Verbalized understanding and denied questions.   

## 2018-02-05 NOTE — Progress Notes (Signed)
BP 128/66   Pulse 64   Ht 5\' 6"  (1.676 m)   Wt 171 lb 14.4 oz (78 kg)   SpO2 98%   BMI 27.75 kg/m    Subjective:    Patient ID: Rick [name], male    DOB: 1940/11/04, 77 y.o.   MRN: [id number]  HPI: Rick [name] is a 77 y.o. male  Chief Complaint  Patient presents with  . Follow-up  . Ear Pain    Right   EAR CLOGGED Duration: about a week Involved ear(s): right Severity:  moderate  Quality:  Sore and aching Fever: no Otorrhea: no Upper respiratory infection symptoms: no Pruritus: no Hearing loss: yes Water immersion no Using Q-tips: no Recurrent otitis media: no Status: worse Treatments attempted: none  Went to see the urologist on 6/13. He states that he is only having a small amount of burning now. Feeling much better. He states that he likes Dr. Gloriann Loan. He is scheduled for a repeat cystoscopy, urodynamic studies and CT urogram. He states that he has finished his antibiotics and is starting to feel a bit better.   Relevant past medical, surgical, family and social history reviewed and updated as indicated. Interim medical history since our last visit reviewed. Allergies and medications reviewed and updated.  Review of Systems  Constitutional: Negative.   Respiratory: Negative.   Cardiovascular: Negative.   Gastrointestinal: Negative.   Genitourinary: Positive for dysuria and frequency. Negative for decreased urine volume, difficulty urinating, discharge, enuresis, flank pain, genital sores, hematuria, penile pain, penile swelling, scrotal swelling, testicular pain and urgency.  Musculoskeletal: Negative.   Psychiatric/Behavioral: Negative.     Per HPI unless specifically indicated above     Objective:    BP 128/66   Pulse 64   Ht 5\' 6"  (1.676 m)   Wt 171 lb 14.4 oz (78 kg)   SpO2 98%   BMI 27.75 kg/m   Wt Readings from Last 3 Encounters:  02/05/18 171 lb 14.4 oz (78 kg)  01/15/18 167 lb 2 oz (75.8 kg)  01/02/18 168 lb (76.2 kg)    Physical  Exam  Constitutional: He is oriented to person, place, and time. He appears well-developed and well-nourished. No distress.  HENT:  Head: Normocephalic and atraumatic.  Right Ear: Hearing normal.  Left Ear: Hearing normal.  Nose: Nose normal.  Eyes: Conjunctivae and lids are normal. Right eye exhibits no discharge. Left eye exhibits no discharge. No scleral icterus.  Cardiovascular: Normal rate, regular rhythm, normal heart sounds and intact distal pulses. Exam reveals no gallop and no friction rub.  No murmur heard. Pulmonary/Chest: Effort normal and breath sounds normal. No stridor. No respiratory distress. He has no wheezes. He has no rales. He exhibits no tenderness.  Musculoskeletal: Normal range of motion.  Neurological: He is alert and oriented to person, place, and time.  Skin: Skin is warm, dry and intact. Capillary refill takes less than 2 seconds. No rash noted. He is not diaphoretic. No erythema. No pallor.  Psychiatric: His speech is normal. Judgment and thought content normal. His affect is angry. He is agitated. Cognition and memory are normal.  Nursing note and vitals reviewed.   Results for orders placed or performed in visit on 01/15/18  Urine Culture  Result Value Ref Range   Urine Culture, Routine Final report (A)    Organism ID, Bacteria Escherichia coli (A)    Antimicrobial Susceptibility Comment   Microscopic Examination  Result Value Ref Range   WBC, UA >  30 (A) 0 - 5 /hpf   RBC, UA 0-2 0 - 2 /hpf   Epithelial Cells (non renal) 0-10 0 - 10 /hpf   Renal Epithel, UA 0-10 (A) None seen /hpf   Bacteria, UA Many (A) None seen/Few  UA/M w/rflx Culture, Routine  Result Value Ref Range   Specific Gravity, UA 1.010 1.005 - 1.030   pH, UA 5.0 5.0 - 7.5   Color, UA Orange Yellow   Appearance Ur Turbid (A) Clear   Leukocytes, UA 3+ (A) Negative   Protein, UA 2+ (A) Negative/Trace   Glucose, UA Trace (A) Negative   Ketones, UA Negative Negative   RBC, UA 1+ (A)  Negative   Bilirubin, UA Negative Negative   Urobilinogen, Ur 1.0 0.2 - 1.0 mg/dL   Nitrite, UA Positive (A) Negative   Microscopic Examination See below:       Assessment & Plan:   Problem List Items Addressed This Visit      Genitourinary   Recurrent UTI    Continuing to follow with urology. Patient is very concerned about having a tumor or a cyst. Continue to monitor. Call with any concerns.       Relevant Orders   UA/M w/rflx Culture, Routine   Urine Culture    Other Visit Diagnoses    Decreased hearing of right ear    -  Primary   No wax in the ear. Will treat with 3 days of prednisone in case it's ETD- will get him back in to see his ENT. Appointment made for next week.        Follow up plan: Return As scheduled for DM, for Records release [facility], Dr. Gloriann Loan.

## 2018-02-05 NOTE — Telephone Encounter (Signed)
Please let him know that his urine came back clean. No sign of any infection right now. Thanks!

## 2018-02-05 NOTE — Patient Instructions (Signed)
Appointment with Dr. Richardson Landry 10:15 7/5 for your Ear

## 2018-02-05 NOTE — Assessment & Plan Note (Signed)
Continuing to follow with urology. Patient is very concerned about having a tumor or a cyst. Continue to monitor. Call with any concerns.

## 2018-02-11 ENCOUNTER — Encounter: Payer: Self-pay | Admitting: Physician Assistant

## 2018-02-11 ENCOUNTER — Ambulatory Visit (INDEPENDENT_AMBULATORY_CARE_PROVIDER_SITE_OTHER): Payer: Medicare Other | Admitting: Physician Assistant

## 2018-02-11 VITALS — BP 119/69 | HR 84 | Temp 98.5°F | Wt 166.8 lb

## 2018-02-11 DIAGNOSIS — E1142 Type 2 diabetes mellitus with diabetic polyneuropathy: Secondary | ICD-10-CM | POA: Diagnosis not present

## 2018-02-11 DIAGNOSIS — R399 Unspecified symptoms and signs involving the genitourinary system: Secondary | ICD-10-CM | POA: Diagnosis not present

## 2018-02-11 DIAGNOSIS — C669 Malignant neoplasm of unspecified ureter: Secondary | ICD-10-CM

## 2018-02-11 DIAGNOSIS — C679 Malignant neoplasm of bladder, unspecified: Secondary | ICD-10-CM

## 2018-02-11 DIAGNOSIS — N3090 Cystitis, unspecified without hematuria: Secondary | ICD-10-CM

## 2018-02-11 LAB — UA/M W/RFLX CULTURE, ROUTINE
Bilirubin, UA: NEGATIVE
Glucose, UA: NEGATIVE
Nitrite, UA: POSITIVE — AB
Specific Gravity, UA: 1.015 (ref 1.005–1.030)
Urobilinogen, Ur: 1 mg/dL (ref 0.2–1.0)
pH, UA: 5 (ref 5.0–7.5)

## 2018-02-11 LAB — MICROSCOPIC EXAMINATION: WBC, UA: >30 /hpf — AB (ref 0–5)

## 2018-02-11 MED ORDER — AMOXICILLIN-POT CLAVULANATE 875-125 MG PO TABS: 1.0000 | ORAL_TABLET | Freq: Two times a day (BID) | ORAL | 0 refills | Status: DC

## 2018-02-11 MED ORDER — AMOXICILLIN-POT CLAVULANATE 875-125 MG PO TABS: 1.0000 | ORAL_TABLET | Freq: Two times a day (BID) | ORAL | 0 refills | Status: AC

## 2018-02-11 NOTE — Progress Notes (Signed)
Subjective:    Patient ID: Rick [name], male    DOB: 29-Nov-1940, 77 y.o.   MRN: [id number]  Rick [name] is a 77 y.o. male presenting on 02/11/2018 for Dysuria   HPI   Rick [name] presents today for dysuria x 7 days. PMH includes bladder cancer, DM II, recurrent urinary infections Has been having symptoms x 1 week. He denies fever, chills, nausea and vomiting. He denies rectal pain. He does have a urologist, Dr. Gloriann Loan with [facility]. He is not compliant with follow up. Has been instructed multiple times to see urology about his issues.   Much of visit, patient remains fixated on specific topics. He asks if I prescribe medicine upon entering the room. He presents a book copyrighted in 1996 with a sentence about Hiprex and questions if this medication might be right for him. He demands I look it up and questions multiple times if I will prescribe it. When I say I will not, he demands I consult with my "colleagues" about this. He then talks about the possibility of bacteria "formenting" around a tumor and this being the reason for his recurrent UTI. He demands I look the word "forment" up as well and says he is going to call the dictionary company and obtain a definition of this word and bring it back to me. He also becomes stuck on having me verbalize that cancer WILL kill somebody. He continues yelling and repeatedly shakes his finger in my face until I agree with him and then says "So you're not just some dumb bunny after all."   Social History   Tobacco Use  . Smoking status: Former Smoker    Packs/day: 1.50    Years: 15.00    Pack years: 22.50    Types: Cigarettes    Start date: 08/14/1994    Last attempt to quit: 03/23/2005    Years since quitting: 12.9  . Smokeless tobacco: Former User    Types: Snuff, Sarina Ser    Quit date: 03/23/2010  Substance Use Topics  . Alcohol use: No    Alcohol/week: 0.0 oz  . Drug use: No    Review of Systems Per HPI unless specifically  indicated above     Objective:    BP 119/69   Pulse 84   Temp 98.5 F (36.9 C) (Oral)   Wt 166 lb 12.8 oz (75.7 kg)   SpO2 98%   BMI 26.92 kg/m   Wt Readings from Last 3 Encounters:  02/11/18 166 lb 12.8 oz (75.7 kg)  02/05/18 171 lb 14.4 oz (78 kg)  01/15/18 167 lb 2 oz (75.8 kg)    Physical Exam  Constitutional: He is oriented to person, place, and time. He appears well-developed and well-nourished.  Cardiovascular: Normal rate and regular rhythm.  Pulmonary/Chest: Effort normal and breath sounds normal.  Abdominal: Soft. Bowel sounds are normal. There is no tenderness. There is no CVA tenderness.  Neurological: He is alert and oriented to person, place, and time.  Skin: Skin is warm and dry.  Psychiatric: He has a normal mood and affect. His behavior is normal.   Results for orders placed or performed in visit on 02/11/18  Microscopic Examination  Result Value Ref Range   WBC, UA >30 (A) 0 - 5 /hpf   RBC, UA 0-2 0 - 2 /hpf   Epithelial Cells (non renal) 0-10 0 - 10 /hpf   Renal Epithel, UA 0-10 (A) None seen /hpf   Bacteria, UA  Many (A) None seen/Few  UA/M w/rflx Culture, Routine  Result Value Ref Range   Specific Gravity, UA 1.015 1.005 - 1.030   pH, UA 5.0 5.0 - 7.5   Color, UA Orange Yellow   Appearance Ur Turbid (A) Clear   Leukocytes, UA 3+ (A) Negative   Protein, UA 2+ (A) Negative/Trace   Glucose, UA Negative Negative   Ketones, UA Trace (A) Negative   RBC, UA 1+ (A) Negative   Bilirubin, UA Negative Negative   Urobilinogen, Ur 1.0 0.2 - 1.0 mg/dL   Nitrite, UA Positive (A) Negative   Microscopic Examination See below:       Assessment & Plan:   1. UTI symptoms  Treat as below, urine looks positive today. Patient insistent on 10 day course of antibiotics since he says seven days did not do it last time. I do not think this is overly necessary but he is rather belligerent about it and so I wrote him for ten days. He has been instructed to follow up  with urology for more comprehensive evaluation and treatment of these issues.  I do not appreciate his behavior towards me in clinic. I advised him that he needed to stop yelling at me. Management has been alerted of his behavior in clinic today, which appears to be somewhat of his baseline.   - UA/M w/rflx Culture, Routine - amoxicillin-clavulanate (AUGMENTIN) 875-125 MG tablet; Take 1 tablet by mouth 2 (two) times daily for 10 days.  Dispense: 20 tablet; Refill: 0  2. Cystitis  - amoxicillin-clavulanate (AUGMENTIN) 875-125 MG tablet; Take 1 tablet by mouth 2 (two) times daily for 10 days.  Dispense: 20 tablet; Refill: 0 - CULTURE, URINE COMPREHENSIVE  3. 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  4. Malignant neoplasm of urinary bladder, unspecified site ([place])  5. Malignant neoplasm of ureter, unspecified laterality ([place])    Follow up plan: Return if symptoms worsen or fail to improve.   Carles Collet, PA-C  [facility] 02/13/2018, 12:33 PM.

## 2018-02-11 NOTE — Patient Instructions (Signed)
Urinary Tract Infection, Adult  A urinary tract infection (UTI) is an infection of any part of the urinary tract, which includes the kidneys, ureters, bladder, and urethra. These organs make, store, and get rid of urine in the body. UTI can be a bladder infection (cystitis) or kidney infection (pyelonephritis).  What are the causes?  This infection may be caused by fungi, viruses, or bacteria. Bacteria are the most common cause of UTIs. This condition can also be caused by repeated incomplete emptying of the bladder during urination.  What increases the risk?  This condition is more likely to develop if:   You ignore your need to urinate or hold urine for long periods of time.   You do not empty your bladder completely during urination.   You wipe back to front after urinating or having a bowel movement, if you are male.   You are uncircumcised, if you are male.   You are constipated.   You have a urinary catheter that stays in place (indwelling).   You have a weak defense (immune) system.   You have a medical condition that affects your bowels, kidneys, or bladder.   You have diabetes.   You take antibiotic medicines frequently or for long periods of time, and the antibiotics no longer work well against certain types of infections (antibiotic resistance).   You take medicines that irritate your urinary tract.   You are exposed to chemicals that irritate your urinary tract.   You are male.    What are the signs or symptoms?  Symptoms of this condition include:   Fever.   Frequent urination or passing small amounts of urine frequently.   Needing to urinate urgently.   Pain or burning with urination.   Urine that smells bad or unusual.   Cloudy urine.   Pain in the lower abdomen or back.   Trouble urinating.   Blood in the urine.   Vomiting or being less hungry than normal.   Diarrhea or abdominal pain.   Vaginal discharge, if you are male.    How is this diagnosed?  This condition is  diagnosed with a medical history and physical exam. You will also need to provide a urine sample to test your urine. Other tests may be done, including:   Blood tests.   Sexually transmitted disease (STD) testing.    If you have had more than one UTI, a cystoscopy or imaging studies may be done to determine the cause of the infections.  How is this treated?  Treatment for this condition often includes a combination of two or more of the following:   Antibiotic medicine.   Other medicines to treat less common causes of UTI.   Over-the-counter medicines to treat pain.   Drinking enough water to stay hydrated.    Follow these instructions at home:   Take over-the-counter and prescription medicines only as told by your health care provider.   If you were prescribed an antibiotic, take it as told by your health care provider. Do not stop taking the antibiotic even if you start to feel better.   Avoid alcohol, caffeine, tea, and carbonated beverages. They can irritate your bladder.   Drink enough fluid to keep your urine clear or pale yellow.   Keep all follow-up visits as told by your health care provider. This is important.   Make sure to:  ? Empty your bladder often and completely. Do not hold urine for long periods of time.  ?   Empty your bladder before and after sex.  ? Wipe from front to back after a bowel movement if you are male. Use each tissue one time when you wipe.  Contact a health care provider if:   You have back pain.   You have a fever.   You feel nauseous or vomit.   Your symptoms do not get better after 3 days.   Your symptoms go away and then return.  Get help right away if:   You have severe back pain or lower abdominal pain.   You are vomiting and cannot keep down any medicines or water.  This information is not intended to replace advice given to you by your health care provider. Make sure you discuss any questions you have with your health care provider.  Document Released:  05/10/2005 Document Revised: 01/12/2016 Document Reviewed: 06/21/2015  Elsevier Interactive Patient Education  2018 [employer]

## 2018-02-13 NOTE — Telephone Encounter (Signed)
Pt is calling back for results please call back

## 2018-02-13 NOTE — Telephone Encounter (Signed)
Already spoke with patient and gave him his results.

## 2018-02-15 DIAGNOSIS — H93299 Other abnormal auditory perceptions, unspecified ear: Secondary | ICD-10-CM | POA: Diagnosis not present

## 2018-02-15 DIAGNOSIS — H6981 Other specified disorders of Eustachian tube, right ear: Secondary | ICD-10-CM | POA: Diagnosis not present

## 2018-02-15 DIAGNOSIS — J301 Allergic rhinitis due to pollen: Secondary | ICD-10-CM | POA: Diagnosis not present

## 2018-02-15 DIAGNOSIS — H6501 Acute serous otitis media, right ear: Secondary | ICD-10-CM | POA: Diagnosis not present

## 2018-02-15 LAB — CULTURE, URINE COMPREHENSIVE

## 2018-02-20 DIAGNOSIS — R3914 Feeling of incomplete bladder emptying: Secondary | ICD-10-CM | POA: Diagnosis not present

## 2018-02-25 ENCOUNTER — Encounter: Payer: Self-pay | Admitting: Family Medicine

## 2018-02-25 DIAGNOSIS — R311 Benign essential microscopic hematuria: Secondary | ICD-10-CM | POA: Diagnosis not present

## 2018-02-25 DIAGNOSIS — K5730 Diverticulosis of large intestine without perforation or abscess without bleeding: Secondary | ICD-10-CM | POA: Diagnosis not present

## 2018-02-25 DIAGNOSIS — C679 Malignant neoplasm of bladder, unspecified: Secondary | ICD-10-CM | POA: Diagnosis not present

## 2018-03-06 ENCOUNTER — Encounter: Payer: Medicare Other | Admitting: Family Medicine

## 2018-03-07 DIAGNOSIS — C679 Malignant neoplasm of bladder, unspecified: Secondary | ICD-10-CM | POA: Diagnosis not present

## 2018-03-08 ENCOUNTER — Other Ambulatory Visit: Payer: Self-pay | Admitting: Urology

## 2018-03-11 ENCOUNTER — Encounter (HOSPITAL_COMMUNITY): Payer: Self-pay | Admitting: *Deleted

## 2018-03-11 NOTE — Progress Notes (Signed)
Patient reported at time of preop call when he called me he only had a few mintues. To talk.  Unable to complete all of preop call at this time.

## 2018-03-12 NOTE — Progress Notes (Signed)
Patient called to inquire time to arrive for surgery on 03/22/2018 as patient had misplaced paper with information on it. Reviewed instructions of time to arrive at Admitting at [facility] , arrive at  10 am to Admitting. Patient did not understand that the main entrance to [facility] was not at the side [facility] is located and got upset and raised his voice. Instructed patient the address to the front of the hospital is [facility]. Patient thanked me for helping him.

## 2018-03-21 NOTE — Anesthesia Preprocedure Evaluation (Addendum)
Anesthesia Evaluation  Patient identified by MRN, date of birth, ID band Patient awake    Reviewed: Allergy & Precautions, NPO status , Patient's Chart, lab work & pertinent test results  Airway Mallampati: I  TM Distance: >3 FB Neck ROM: Full    Dental no notable dental hx. (+) Dental Advisory Given, Edentulous Upper, Edentulous Lower   Pulmonary former smoker,    Pulmonary exam normal breath sounds clear to auscultation       Cardiovascular hypertension, Pt. on medications + Peripheral Vascular Disease  Normal cardiovascular exam+ dysrhythmias  Rhythm:Regular Rate:Normal  2/17 echo Procedure narrative: Transthoracic echocardiography. The study   was technically difficult. - Left ventricle: Systolic function was normal. The estimated   ejection fraction was in the range of 55% to 65%.   Neuro/Psych  Neuromuscular disease    GI/Hepatic negative GI ROS, Neg liver ROS,   Endo/Other  diabetes, Type 1, Insulin Dependent  Renal/GU Renal disease     Musculoskeletal  (+) Arthritis ,   Abdominal   Peds negative pediatric ROS (+)  Hematology negative hematology ROS (+)   Anesthesia Other Findings   Reproductive/Obstetrics                            Anesthesia Physical Anesthesia Plan  ASA: III  Anesthesia Plan: General   Post-op Pain Management:    Induction: Intravenous  PONV Risk Score and Plan: 2 and Treatment may vary due to age or medical condition, Ondansetron and Dexamethasone  Airway Management Planned: LMA  Additional Equipment:   Intra-op Plan:   Post-operative Plan:   Informed Consent: I have reviewed the patients History and Physical, chart, labs and discussed the procedure including the risks, benefits and alternatives for the proposed anesthesia with the patient or authorized representative who has indicated his/her understanding and acceptance.     Plan Discussed  with: CRNA  Anesthesia Plan Comments:         Anesthesia Quick Evaluation

## 2018-03-22 ENCOUNTER — Ambulatory Visit (HOSPITAL_COMMUNITY): Payer: Medicare Other

## 2018-03-22 ENCOUNTER — Ambulatory Visit (HOSPITAL_COMMUNITY): Payer: Medicare Other | Admitting: Certified Registered Nurse Anesthetist

## 2018-03-22 ENCOUNTER — Encounter (HOSPITAL_COMMUNITY)
Admission: RE | Disposition: A | Discharge status: Home / Self Care | Payer: Self-pay | Source: Ambulatory Visit | Attending: Urology

## 2018-03-22 ENCOUNTER — Observation Stay (HOSPITAL_COMMUNITY)
Admission: RE | Admit: 2018-03-22 | Discharge: 2018-03-23 | Disposition: A | Discharge status: Home / Self Care | Payer: Medicare Other | Source: Ambulatory Visit | Attending: Urology | Admitting: Urology

## 2018-03-22 ENCOUNTER — Encounter (HOSPITAL_COMMUNITY): Payer: Self-pay | Admitting: Certified Registered Nurse Anesthetist

## 2018-03-22 ENCOUNTER — Other Ambulatory Visit: Payer: Self-pay

## 2018-03-22 DIAGNOSIS — Z794 Long term (current) use of insulin: Secondary | ICD-10-CM | POA: Diagnosis not present

## 2018-03-22 DIAGNOSIS — Z882 Allergy status to sulfonamides status: Secondary | ICD-10-CM | POA: Insufficient documentation

## 2018-03-22 DIAGNOSIS — R339 Retention of urine, unspecified: Secondary | ICD-10-CM | POA: Diagnosis not present

## 2018-03-22 DIAGNOSIS — I10 Essential (primary) hypertension: Secondary | ICD-10-CM | POA: Diagnosis not present

## 2018-03-22 DIAGNOSIS — Z79899 Other long term (current) drug therapy: Secondary | ICD-10-CM | POA: Diagnosis not present

## 2018-03-22 DIAGNOSIS — F329 Major depressive disorder, single episode, unspecified: Secondary | ICD-10-CM | POA: Diagnosis not present

## 2018-03-22 DIAGNOSIS — I739 Peripheral vascular disease, unspecified: Secondary | ICD-10-CM | POA: Insufficient documentation

## 2018-03-22 DIAGNOSIS — R338 Other retention of urine: Principal | ICD-10-CM | POA: Insufficient documentation

## 2018-03-22 DIAGNOSIS — F419 Anxiety disorder, unspecified: Secondary | ICD-10-CM | POA: Diagnosis not present

## 2018-03-22 DIAGNOSIS — N419 Inflammatory disease of prostate, unspecified: Secondary | ICD-10-CM | POA: Diagnosis not present

## 2018-03-22 DIAGNOSIS — E1140 Type 2 diabetes mellitus with diabetic neuropathy, unspecified: Secondary | ICD-10-CM | POA: Diagnosis not present

## 2018-03-22 DIAGNOSIS — N138 Other obstructive and reflux uropathy: Secondary | ICD-10-CM | POA: Diagnosis not present

## 2018-03-22 DIAGNOSIS — E1051 Type 1 diabetes mellitus with diabetic peripheral angiopathy without gangrene: Secondary | ICD-10-CM | POA: Diagnosis not present

## 2018-03-22 DIAGNOSIS — N401 Enlarged prostate with lower urinary tract symptoms: Secondary | ICD-10-CM | POA: Diagnosis not present

## 2018-03-22 DIAGNOSIS — Z85828 Personal history of other malignant neoplasm of skin: Secondary | ICD-10-CM | POA: Insufficient documentation

## 2018-03-22 DIAGNOSIS — E785 Hyperlipidemia, unspecified: Secondary | ICD-10-CM | POA: Insufficient documentation

## 2018-03-22 DIAGNOSIS — N320 Bladder-neck obstruction: Secondary | ICD-10-CM | POA: Diagnosis not present

## 2018-03-22 DIAGNOSIS — Z8551 Personal history of malignant neoplasm of bladder: Secondary | ICD-10-CM | POA: Insufficient documentation

## 2018-03-22 DIAGNOSIS — N400 Enlarged prostate without lower urinary tract symptoms: Secondary | ICD-10-CM | POA: Diagnosis present

## 2018-03-22 HISTORY — DX: Urinary tract infection, site not specified: N39.0

## 2018-03-22 HISTORY — PX: TRANSURETHRAL RESECTION OF PROSTATE: SHX73

## 2018-03-22 HISTORY — DX: Cystitis, unspecified without hematuria: N30.90

## 2018-03-22 HISTORY — PX: CYSTOSCOPY/URETEROSCOPY/HOLMIUM LASER/STENT PLACEMENT: SHX6546

## 2018-03-22 LAB — BASIC METABOLIC PANEL
Anion gap: 7 (ref 5–15)
BUN: 25 mg/dL — AB (ref 8–23)
CHLORIDE: 109 mmol/L (ref 98–111)
CO2: 23 mmol/L (ref 22–32)
CREATININE: 0.93 mg/dL (ref 0.61–1.24)
Calcium: 9.4 mg/dL (ref 8.9–10.3)
GFR calc Af Amer: >60 mL/min (ref >60)
GFR calc non Af Amer: >60 mL/min (ref >60)
Glucose, Bld: 118 mg/dL — ABNORMAL HIGH (ref 70–99)
POTASSIUM: 4 mmol/L (ref 3.5–5.1)
Sodium: 139 mmol/L (ref 135–145)

## 2018-03-22 LAB — GLUCOSE, CAPILLARY
GLUCOSE-CAPILLARY: 93 mg/dL (ref 70–99)
GLUCOSE-CAPILLARY: 134 mg/dL — AB (ref 70–99)
Glucose-Capillary: 130 mg/dL — ABNORMAL HIGH (ref 70–99)
Glucose-Capillary: 261 mg/dL — ABNORMAL HIGH (ref 70–99)

## 2018-03-22 LAB — CBC
HCT: 41.9 % (ref 39.0–52.0)
HEMOGLOBIN: 14 g/dL (ref 13.0–17.0)
MCH: 29 pg (ref 26.0–34.0)
MCHC: 33.4 g/dL (ref 30.0–36.0)
MCV: 86.7 fL (ref 78.0–100.0)
Platelets: 269 10*3/uL (ref 150–400)
RBC: 4.83 MIL/uL (ref 4.22–5.81)
RDW: 13.8 % (ref 11.5–15.5)
WBC: 8.7 10*3/uL (ref 4.0–10.5)

## 2018-03-22 LAB — PROTIME-INR
INR: 1
Prothrombin Time: 13.1 seconds (ref 11.4–15.2)

## 2018-03-22 LAB — TYPE AND SCREEN
ABO/RH(D): A POS
ANTIBODY SCREEN: NEGATIVE

## 2018-03-22 LAB — HEMOGLOBIN A1C
HEMOGLOBIN A1C: 6.2 % — AB (ref 4.8–5.6)
MEAN PLASMA GLUCOSE: 131.24 mg/dL

## 2018-03-22 LAB — ABO/RH: ABO/RH(D): A POS

## 2018-03-22 SURGERY — CYSTOSCOPY/URETEROSCOPY/HOLMIUM LASER/STENT PLACEMENT
Anesthesia: General

## 2018-03-22 MED ORDER — CARVEDILOL 6.25 MG PO TABS
6.2500 mg | ORAL_TABLET | Freq: Two times a day (BID) | ORAL | Status: DC
  Administered 2018-03-22: 6.25 mg via ORAL
  Filled 2018-03-22: qty 1

## 2018-03-22 MED ORDER — SODIUM CHLORIDE 0.9 % IR SOLN
Status: DC | PRN
  Administered 2018-03-22: 12000 mL via INTRAVESICAL

## 2018-03-22 MED ORDER — MIDAZOLAM HCL 2 MG/2ML IJ SOLN
INTRAMUSCULAR | Status: AC
  Filled 2018-03-22: qty 2

## 2018-03-22 MED ORDER — LIDOCAINE 2% (20 MG/ML) 5 ML SYRINGE
INTRAMUSCULAR | Status: AC
  Filled 2018-03-22: qty 5

## 2018-03-22 MED ORDER — MIDAZOLAM HCL 2 MG/2ML IJ SOLN
INTRAMUSCULAR | Status: DC | PRN
  Administered 2018-03-22: 2 mg via INTRAVENOUS

## 2018-03-22 MED ORDER — HYDROCODONE-ACETAMINOPHEN 7.5-325 MG PO TABS: 1.0000 | ORAL_TABLET | Freq: Once | ORAL | Status: DC | PRN

## 2018-03-22 MED ORDER — LACTATED RINGERS IV SOLN
INTRAVENOUS | Status: DC
  Administered 2018-03-22: 12:00:00 via INTRAVENOUS

## 2018-03-22 MED ORDER — ONDANSETRON HCL 4 MG/2ML IJ SOLN
INTRAMUSCULAR | Status: AC
  Filled 2018-03-22: qty 2

## 2018-03-22 MED ORDER — PREDNISONE 50 MG PO TABS: 50.0000 mg | ORAL_TABLET | Freq: Every day | ORAL | Status: DC

## 2018-03-22 MED ORDER — DIPHENHYDRAMINE HCL 12.5 MG/5ML PO ELIX: 12.5000 mg | ORAL_SOLUTION | Freq: Four times a day (QID) | ORAL | Status: DC | PRN

## 2018-03-22 MED ORDER — HYDROMORPHONE HCL 1 MG/ML IJ SOLN
INTRAMUSCULAR | Status: AC
  Filled 2018-03-22: qty 1

## 2018-03-22 MED ORDER — TRAMADOL HCL 50 MG PO TABS: 50.0000 mg | ORAL_TABLET | Freq: Four times a day (QID) | ORAL | 0 refills | Status: DC | PRN

## 2018-03-22 MED ORDER — PIPERACILLIN-TAZOBACTAM 3.375 G IVPB 30 MIN
3.3750 g | INTRAVENOUS | Status: AC
  Administered 2018-03-22: 3.375 g via INTRAVENOUS
  Filled 2018-03-22 (×2): qty 50

## 2018-03-22 MED ORDER — LIDOCAINE 2% (20 MG/ML) 5 ML SYRINGE
INTRAMUSCULAR | Status: DC | PRN
  Administered 2018-03-22: 50 mg via INTRAVENOUS

## 2018-03-22 MED ORDER — FLUTICASONE PROPIONATE 50 MCG/ACT NA SUSP
2.0000 | Freq: Two times a day (BID) | NASAL | Status: DC
  Filled 2018-03-22: qty 16

## 2018-03-22 MED ORDER — PHENYLEPHRINE 40 MCG/ML (10ML) SYRINGE FOR IV PUSH (FOR BLOOD PRESSURE SUPPORT)
PREFILLED_SYRINGE | INTRAVENOUS | Status: AC
  Filled 2018-03-22: qty 10

## 2018-03-22 MED ORDER — PROPOFOL 10 MG/ML IV BOLUS
INTRAVENOUS | Status: AC
  Filled 2018-03-22: qty 20

## 2018-03-22 MED ORDER — 0.9 % SODIUM CHLORIDE (POUR BTL) OPTIME
TOPICAL | Status: DC | PRN
  Administered 2018-03-22: 1000 mL

## 2018-03-22 MED ORDER — PROMETHAZINE HCL 25 MG/ML IJ SOLN: 6.2500 mg | INTRAMUSCULAR | Status: DC | PRN

## 2018-03-22 MED ORDER — ACETAMINOPHEN 10 MG/ML IV SOLN
1000.0000 mg | Freq: Once | INTRAVENOUS | Status: DC | PRN
  Administered 2018-03-22: 1000 mg via INTRAVENOUS

## 2018-03-22 MED ORDER — SODIUM CHLORIDE 0.9 % IV SOLN
INTRAVENOUS | Status: DC
  Administered 2018-03-22 – 2018-03-23 (×2): via INTRAVENOUS

## 2018-03-22 MED ORDER — PRAVASTATIN SODIUM 40 MG PO TABS: 40.0000 mg | ORAL_TABLET | Freq: Every day | ORAL | Status: DC

## 2018-03-22 MED ORDER — BACITRACIN-NEOMYCIN-POLYMYXIN 400-5-5000 EX OINT: 1.0000 "application " | TOPICAL_OINTMENT | Freq: Three times a day (TID) | CUTANEOUS | Status: DC | PRN

## 2018-03-22 MED ORDER — OXYBUTYNIN CHLORIDE 5 MG PO TABS: 5.0000 mg | ORAL_TABLET | Freq: Three times a day (TID) | ORAL | Status: DC | PRN

## 2018-03-22 MED ORDER — AMOXICILLIN-POT CLAVULANATE 875-125 MG PO TABS: 1.0000 | ORAL_TABLET | Freq: Two times a day (BID) | ORAL | 0 refills | Status: AC

## 2018-03-22 MED ORDER — PIPERACILLIN-TAZOBACTAM 3.375 G IVPB
3.3750 g | Freq: Three times a day (TID) | INTRAVENOUS | Status: AC
  Administered 2018-03-22 – 2018-03-23 (×2): 3.375 g via INTRAVENOUS
  Filled 2018-03-22 (×2): qty 50

## 2018-03-22 MED ORDER — DEXAMETHASONE SODIUM PHOSPHATE 4 MG/ML IJ SOLN
INTRAMUSCULAR | Status: DC | PRN
  Administered 2018-03-22: 5 mg via INTRAVENOUS

## 2018-03-22 MED ORDER — FENTANYL CITRATE (PF) 100 MCG/2ML IJ SOLN
INTRAMUSCULAR | Status: DC | PRN
  Administered 2018-03-22 (×2): 50 ug via INTRAVENOUS

## 2018-03-22 MED ORDER — MIRTAZAPINE 15 MG PO TABS
15.0000 mg | ORAL_TABLET | Freq: Every day | ORAL | Status: DC
  Filled 2018-03-22: qty 1

## 2018-03-22 MED ORDER — FENTANYL CITRATE (PF) 100 MCG/2ML IJ SOLN
INTRAMUSCULAR | Status: AC
  Filled 2018-03-22: qty 2

## 2018-03-22 MED ORDER — IOHEXOL 300 MG/ML  SOLN
INTRAMUSCULAR | Status: DC | PRN
Start: 2018-03-22 — End: 2018-03-22
  Administered 2018-03-22: 10 mL via URETHRAL

## 2018-03-22 MED ORDER — ACETAMINOPHEN 10 MG/ML IV SOLN
INTRAVENOUS | Status: AC
  Filled 2018-03-22: qty 100

## 2018-03-22 MED ORDER — HYDROCHLOROTHIAZIDE 25 MG PO TABS: 25.0000 mg | ORAL_TABLET | Freq: Every day | ORAL | Status: DC

## 2018-03-22 MED ORDER — INSULIN ASPART 100 UNIT/ML ~~LOC~~ SOLN
0.0000 [IU] | Freq: Three times a day (TID) | SUBCUTANEOUS | Status: DC
  Administered 2018-03-22: 2 [IU] via SUBCUTANEOUS
  Administered 2018-03-23: 11 [IU] via SUBCUTANEOUS

## 2018-03-22 MED ORDER — SODIUM CHLORIDE 0.9 % IR SOLN: 3000.0000 mL | Status: DC

## 2018-03-22 MED ORDER — BELLADONNA ALKALOIDS-OPIUM 16.2-60 MG RE SUPP: 1.0000 | Freq: Four times a day (QID) | RECTAL | Status: DC | PRN

## 2018-03-22 MED ORDER — TRAZODONE HCL 100 MG PO TABS
100.0000 mg | ORAL_TABLET | Freq: Every day | ORAL | Status: DC
  Filled 2018-03-22: qty 1

## 2018-03-22 MED ORDER — PROPOFOL 10 MG/ML IV BOLUS
INTRAVENOUS | Status: DC | PRN
  Administered 2018-03-22: 150 mg via INTRAVENOUS

## 2018-03-22 MED ORDER — GABAPENTIN 300 MG PO CAPS
600.0000 mg | ORAL_CAPSULE | Freq: Two times a day (BID) | ORAL | Status: DC
  Filled 2018-03-22: qty 2

## 2018-03-22 MED ORDER — ENALAPRIL MALEATE 10 MG PO TABS
10.0000 mg | ORAL_TABLET | Freq: Every day | ORAL | Status: DC
  Filled 2018-03-22: qty 1

## 2018-03-22 MED ORDER — MEPERIDINE HCL 50 MG/ML IJ SOLN: 6.2500 mg | INTRAMUSCULAR | Status: DC | PRN

## 2018-03-22 MED ORDER — ONDANSETRON HCL 4 MG/2ML IJ SOLN
INTRAMUSCULAR | Status: DC | PRN
  Administered 2018-03-22: 4 mg via INTRAVENOUS

## 2018-03-22 MED ORDER — DIPHENHYDRAMINE HCL 50 MG/ML IJ SOLN: 12.5000 mg | Freq: Four times a day (QID) | INTRAMUSCULAR | Status: DC | PRN

## 2018-03-22 MED ORDER — ONDANSETRON HCL 4 MG/2ML IJ SOLN: 4.0000 mg | INTRAMUSCULAR | Status: DC | PRN

## 2018-03-22 MED ORDER — PHENYLEPHRINE 40 MCG/ML (10ML) SYRINGE FOR IV PUSH (FOR BLOOD PRESSURE SUPPORT)
PREFILLED_SYRINGE | INTRAVENOUS | Status: DC | PRN
  Administered 2018-03-22 (×4): 80 ug via INTRAVENOUS

## 2018-03-22 MED ORDER — HYDROMORPHONE HCL 1 MG/ML IJ SOLN
0.2500 mg | INTRAMUSCULAR | Status: DC | PRN
  Administered 2018-03-22 (×4): 0.5 mg via INTRAVENOUS

## 2018-03-22 MED ORDER — DEXAMETHASONE SODIUM PHOSPHATE 10 MG/ML IJ SOLN
INTRAMUSCULAR | Status: AC
  Filled 2018-03-22: qty 1

## 2018-03-22 MED ORDER — SENNOSIDES-DOCUSATE SODIUM 8.6-50 MG PO TABS
2.0000 | ORAL_TABLET | Freq: Every day | ORAL | Status: DC
  Filled 2018-03-22: qty 2

## 2018-03-22 MED ORDER — ACETAMINOPHEN 500 MG PO TABS
1000.0000 mg | ORAL_TABLET | Freq: Four times a day (QID) | ORAL | Status: DC
  Administered 2018-03-22 – 2018-03-23 (×2): 1000 mg via ORAL
  Filled 2018-03-22 (×2): qty 2

## 2018-03-22 SURGICAL SUPPLY — 27 items
BAG URINE DRAINAGE (UROLOGICAL SUPPLIES) ×2 IMPLANT
BAG URO CATCHER STRL LF (MISCELLANEOUS) ×4 IMPLANT
BASKET LASER NITINOL 1.9FR (BASKET) IMPLANT
BSKT STON RTRVL 120 1.9FR (BASKET)
CATH FOLEY 3WAY 30CC 24FR (CATHETERS) ×4
CATH HEMA 3WAY 30CC 22FR COUDE (CATHETERS) ×4 IMPLANT
CATH INTERMIT  6FR 70CM (CATHETERS) ×2 IMPLANT
CATH URET 5FR 28IN CONE TIP (BALLOONS)
CATH URET 5FR 70CM CONE TIP (BALLOONS) IMPLANT
CATH URTH STD 24FR FL 3W 2 (CATHETERS) IMPLANT
CLOTH BEACON ORANGE TIMEOUT ST (SAFETY) ×4 IMPLANT
ELECT REM PT RETURN 15FT ADLT (MISCELLANEOUS) ×4 IMPLANT
EXTRACTOR STONE 1.7FRX115CM (UROLOGICAL SUPPLIES) IMPLANT
GLOVE BIO SURGEON STRL SZ7.5 (GLOVE) ×4 IMPLANT
GOWN STRL REUS W/TWL LRG LVL3 (GOWN DISPOSABLE) ×2 IMPLANT
GOWN STRL REUS W/TWL XL LVL3 (GOWN DISPOSABLE) ×4 IMPLANT
GUIDEWIRE STR DUAL SENSOR (WIRE) ×4 IMPLANT
HOLDER FOLEY CATH W/STRAP (MISCELLANEOUS) ×4 IMPLANT
LOOP CUT BIPOLAR 24F LRG (ELECTROSURGICAL) ×4 IMPLANT
MANIFOLD NEPTUNE II (INSTRUMENTS) ×4 IMPLANT
PACK CYSTO (CUSTOM PROCEDURE TRAY) ×4 IMPLANT
SET ASPIRATION TUBING (TUBING) ×2 IMPLANT
SYRINGE IRR TOOMEY STRL 70CC (SYRINGE) ×4 IMPLANT
TUBING CONNECTING 10 (TUBING) ×3 IMPLANT
TUBING CONNECTING 10' (TUBING) ×1
TUBING UROLOGY SET (TUBING) ×4 IMPLANT
WATER STERILE IRR 500ML POUR (IV SOLUTION) ×2 IMPLANT

## 2018-03-22 NOTE — Op Note (Signed)
Preoperative diagnosis: 1. Bladder outlet obstruction secondary to BPH 2. History of left ureteral urothelial cell carcinoma  Postoperative diagnosis:  1. Bladder outlet obstruction secondary to BPH 2. History of left ureteral urothelial cell carcinoma  Procedure:  1. Cystoscopy with left retrograde pyelogram, left diagnostic ureteroscopy 2. Transurethral resection of the prostate  Surgeon: Marton Redwood, III. M.D.  Anesthesia: General  Complications: None  EBL: Minimal  Specimens: 1. Prostate chips  Indication: Rick [name] is a patient with bladder outlet obstruction secondary to benign prostatic hyperplasia. After reviewing the management options for treatment, he elected to proceed with the above surgical procedure(s). We have discussed the potential benefits and risks of the procedure, side effects of the proposed treatment, the likelihood of the patient achieving the goals of the procedure, and any potential problems that might occur during the procedure or recuperation. Informed consent has been obtained.  He also has a history of left distal ureteral tumor status post excision with ureteral reimplant.  CT IVP revealed enhancement at the distal ureter concerning for possible malignancy.  Description of procedure:  The patient was taken to the operating room and general anesthesia was induced.  The patient was placed in the dorsal lithotomy position, prepped and draped in the usual sterile fashion, and preoperative antibiotics were administered. A preoperative time-out was performed.   Cystourethroscopy was performed.  The patient's urethra was examined and was normal anteriorly and the prostate demonstrated bilobar prostatic hyperplasia.  There was severe trabeculation.  There was no bladder tumor or stones.  The left ureteral orifice was located at the dome towards the left.  This was patent.  A wire was advanced up to the kidney under fluoroscopic guidance.  A open-ended  ureteral catheter was then passed over the wire into the distal ureter and the wire was withdrawn.  Retrograde pyelogram was performed.  Left retrograde pyelogram revealed mild hydronephrosis down to the level of the bladder.  A wire was readvanced into the kidney and the open-ended ureteral catheter withdrawn.  A semirigid ureteroscope was advanced up the ureter up to the renal pelvis.  Diagnostic ureteroscopy revealed no evidence of tumor along the entire ureter up to the renal pelvis.  There were no filling defects in the kidney to suggest tumor and therefore flexible ureteroscopy was not performed.  The scope and the wire were withdrawn.  There is no trauma and therefore no stent was left.  The bladder was then systematically examined in its entirety. There was no evidence of any bladder tumors, stones, or other mucosal pathology except for severe trabeculation.  The ureteral orifices were identified and marked so as to be avoided during the procedure.  The prostate adenoma was then resected utilizing loop cautery resection with the bipolar cutting loop.  The prostate adenoma from the bladder neck back to the verumontanum was resected beginning at the six o'clock position and then extended to include the right and left lobes of the prostate and anterior prostate. Care was taken not to resect distal to the verumontanum.   Hemostasis was then achieved with the cautery and the bladder was emptied and reinspected with no significant bleeding noted at the end of the procedure.    A 24 French 3 way catheter was then placed into the bladder.  The patient appeared to tolerate the procedure well and without complications.  The patient was able to be awakened and transferred to the recovery unit in satisfactory condition.

## 2018-03-22 NOTE — Anesthesia Postprocedure Evaluation (Signed)
Anesthesia Post Note  Patient: Rick [name]  Procedure(s) Performed: CYSTOSCOPY/LEFT URETEROSCOPY/LEFT RETROGRADE PYELOGRAM (Left ) TRANSURETHRAL RESECTION OF THE PROSTATE (TURP) (N/A )     Patient location during evaluation: PACU Anesthesia Type: General Level of consciousness: awake and alert Pain management: pain level controlled Vital Signs Assessment: post-procedure vital signs reviewed and stable Respiratory status: spontaneous breathing, nonlabored ventilation, respiratory function stable and patient connected to nasal cannula oxygen Cardiovascular status: blood pressure returned to baseline and stable Postop Assessment: no apparent nausea or vomiting Anesthetic complications: no    Last Vitals:  Vitals:   03/22/18 1445 03/22/18 1515  BP: 138/80 126/73  Pulse: 85 83  Resp: 14 14  Temp:    SpO2: 98% 98%    Last Pain:  Vitals:   03/22/18 1445  TempSrc:   PainSc: 4                  Barnet Glasgow

## 2018-03-22 NOTE — Anesthesia Procedure Notes (Signed)
Procedure Name: LMA Insertion Date/Time: 03/22/2018 12:40 PM Performed by: Claudia Desanctis, CRNA Pre-anesthesia Checklist: Emergency Drugs available, Patient identified, Suction available and Patient being monitored Patient Re-evaluated:Patient Re-evaluated prior to induction Oxygen Delivery Method: Circle system utilized Preoxygenation: Pre-oxygenation with 100% oxygen Induction Type: IV induction Ventilation: Mask ventilation without difficulty LMA: LMA with gastric port inserted LMA Size: 4.0 Number of attempts: 1 Placement Confirmation: positive ETCO2 and breath sounds checked- equal and bilateral Tube secured with: Tape Dental Injury: Teeth and Oropharynx as per pre-operative assessment

## 2018-03-22 NOTE — Discharge Instructions (Signed)

## 2018-03-22 NOTE — Transfer of Care (Signed)
Immediate Anesthesia Transfer of Care Note  Patient: Rick [name]  Procedure(s) Performed: CYSTOSCOPY/LEFT URETEROSCOPY/LEFT RETROGRADE PYELOGRAM (Left ) TRANSURETHRAL RESECTION OF THE PROSTATE (TURP) (N/A )  Patient Location: PACU  Anesthesia Type:General  Level of Consciousness: awake and oriented  Airway & Oxygen Therapy: Patient Spontanous Breathing and Patient connected to face mask oxygen  Post-op Assessment: Report given to RN and Post -op Vital signs reviewed and stable  Post vital signs: Reviewed and stable  Last Vitals:  Vitals Value Taken Time  BP    Temp    Pulse    Resp    SpO2      Last Pain:  Vitals:   03/22/18 1144  TempSrc:   PainSc: 0-No pain         Complications: No apparent anesthesia complications

## 2018-03-22 NOTE — Progress Notes (Signed)
Patient is refusing all medications tonight due to "high prices of medicines in the hospital". This Probation officer informed him of the medications he was missing and he verbalizes understanding but is adamant about not wanting to take any of our medications. When asked if he had medicine form home he wanted to take, he says he is going home tomorrow and will not worry about it tonight because he definitely is not going to be charged for the costly medications from here.
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
## 2018-03-23 ENCOUNTER — Encounter (HOSPITAL_COMMUNITY): Payer: Self-pay | Admitting: Urology

## 2018-03-23 DIAGNOSIS — I739 Peripheral vascular disease, unspecified: Secondary | ICD-10-CM | POA: Diagnosis not present

## 2018-03-23 DIAGNOSIS — Z85828 Personal history of other malignant neoplasm of skin: Secondary | ICD-10-CM | POA: Diagnosis not present

## 2018-03-23 DIAGNOSIS — E1140 Type 2 diabetes mellitus with diabetic neuropathy, unspecified: Secondary | ICD-10-CM | POA: Diagnosis not present

## 2018-03-23 DIAGNOSIS — N138 Other obstructive and reflux uropathy: Secondary | ICD-10-CM | POA: Diagnosis not present

## 2018-03-23 DIAGNOSIS — Z794 Long term (current) use of insulin: Secondary | ICD-10-CM | POA: Diagnosis not present

## 2018-03-23 DIAGNOSIS — E785 Hyperlipidemia, unspecified: Secondary | ICD-10-CM | POA: Diagnosis not present

## 2018-03-23 DIAGNOSIS — Z8551 Personal history of malignant neoplasm of bladder: Secondary | ICD-10-CM | POA: Diagnosis not present

## 2018-03-23 DIAGNOSIS — Z79899 Other long term (current) drug therapy: Secondary | ICD-10-CM | POA: Diagnosis not present

## 2018-03-23 DIAGNOSIS — R338 Other retention of urine: Secondary | ICD-10-CM | POA: Diagnosis not present

## 2018-03-23 DIAGNOSIS — Z882 Allergy status to sulfonamides status: Secondary | ICD-10-CM | POA: Diagnosis not present

## 2018-03-23 DIAGNOSIS — I10 Essential (primary) hypertension: Secondary | ICD-10-CM | POA: Diagnosis not present

## 2018-03-23 LAB — GLUCOSE, CAPILLARY
GLUCOSE-CAPILLARY: 309 mg/dL — AB (ref 70–99)
Glucose-Capillary: 131 mg/dL — ABNORMAL HIGH (ref 70–99)

## 2018-03-23 MED ORDER — BACITRACIN-NEOMYCIN-POLYMYXIN 400-5-5000 EX OINT: TOPICAL_OINTMENT | Freq: Once | CUTANEOUS | Status: DC

## 2018-03-23 NOTE — Progress Notes (Signed)
Removed foley as per MD order, RN and MD both told patient he must void prior to being discharged.

## 2018-03-23 NOTE — Discharge Summary (Signed)
Physician Discharge Summary  Patient ID: Rick [name] MRN: [id number] DOB/AGE: 11-Feb-1941 77 y.o.  Admit date: 03/22/2018 Discharge date: 03/23/2018  Admission Diagnoses:  Benign localized prostatic hyperplasia with lower urinary tract symptoms (LUTS)  Discharge Diagnoses:  Principal Problem:   Benign localized prostatic hyperplasia with lower urinary tract symptoms (LUTS) Active Problems:   BPH (benign prostatic hyperplasia)   Past Medical History:  Diagnosis Date  . Anxiety   . Bipolar disorder ([place])   . Bladder cancer ([place])   . BPH (benign prostatic hypertrophy)   . Chest pain, atypical 06/23/2015  . Cystitis    hx of   . Depression   . Diabetes mellitus without complication ([place])    type 2   . Difficult or painful urination 10/31/2012  . Dysrhythmia   . Ear problem 03/24/2015  . Gangrenous appendicitis   . H/O urinary disorder 03/27/2013  . Herpes genitalis in men   . Hyperlipidemia   . Hypertension   . Insomnia   . Kidney stones   . Left hand pain 05/18/2015  . Mass of arm 02/10/2015  . Peripheral neuropathy   . Skin cancer   . UD (urethral discharge) 10/31/2012  . Urinary tract infection    hx of     Surgeries: Procedure(s): CYSTOSCOPY/LEFT URETEROSCOPY/LEFT RETROGRADE PYELOGRAM TRANSURETHRAL RESECTION OF THE PROSTATE (TURP) on 03/22/2018   Consultants (if any):   Discharged Condition: Improved  Hospital Course: Rick [name] is an 77 y.o. male who was admitted 03/22/2018 with a diagnosis of Benign localized prostatic hyperplasia with lower urinary tract symptoms (LUTS) and went to the operating room on 03/22/2018 and underwent the above named procedures.  His Urine was clear on 03/23/18 so the foley was removed and he was discharged home when voiding.    He was given perioperative antibiotics:  Anti-infectives (From admission, onward)   Start     Dose/Rate Route Frequency Ordered Stop   03/22/18 1830  piperacillin-tazobactam (ZOSYN) IVPB 3.375 g     3.375  g 12.5 mL/hr over 240 Minutes Intravenous Every 8 hours 03/22/18 1523 03/23/18 0759   03/22/18 1036  piperacillin-tazobactam (ZOSYN) IVPB 3.375 g     3.375 g 100 mL/hr over 30 Minutes Intravenous 30 min pre-op 03/22/18 1036 03/22/18 1250   03/22/18 0000  amoxicillin-clavulanate (AUGMENTIN) 875-125 MG tablet     1 tablet Oral 2 times daily 03/22/18 1349 03/29/18 2359    .  He was given sequential compression devices for DVT prophylaxis.  He benefited maximally from the hospital stay and there were no complications.    Recent vital signs:  Vitals:   03/23/18 0804 03/23/18 1300  BP: (!) 145/69 135/83  Pulse: 95 94  Resp:  18  Temp:  98.7 F (37.1 C)  SpO2:  97%    Recent laboratory studies:  Lab Results  Component Value Date   HGB 14.0 03/22/2018   HGB 14.6 12/06/2017   HGB 13.5 05/14/2017   Lab Results  Component Value Date   WBC 8.7 03/22/2018   PLT 269 03/22/2018   Lab Results  Component Value Date   INR 1.00 03/22/2018   Lab Results  Component Value Date   NA 139 03/22/2018   K 4.0 03/22/2018   CL 109 03/22/2018   CO2 23 03/22/2018   BUN 25 (H) 03/22/2018   CREATININE 0.93 03/22/2018   GLUCOSE 118 (H) 03/22/2018    Discharge Medications:   Allergies as of 03/23/2018      Reactions  Haloperidol Other (See Comments)   Unknown   Sulfa Antibiotics Other (See Comments)   Unknown      Medication List    TAKE these medications   amoxicillin-clavulanate 875-125 MG tablet Commonly known as:  AUGMENTIN Take 1 tablet by mouth 2 (two) times daily for 7 days.   carvedilol 6.25 MG tablet Commonly known as:  COREG Take 1 tablet (6.25 mg total) by mouth 2 (two) times daily with a meal.   diclofenac sodium 1 % Gel Commonly known as:  VOLTAREN Apply 2 g topically 4 (four) times daily.   enalapril 10 MG tablet Commonly known as:  VASOTEC Take 1 tablet (10 mg total) by mouth daily.   finasteride 5 MG tablet Commonly known as:  PROSCAR Take 1 tablet (5 mg  total) by mouth daily.   fluticasone 50 MCG/ACT nasal spray Commonly known as:  FLONASE Place 2 sprays into both nostrils 2 (two) times daily.   gabapentin 300 MG capsule Commonly known as:  NEURONTIN Take 2 capsules (600 mg total) by mouth 2 (two) times daily.   glipiZIDE 2.5 MG 24 hr tablet Commonly known as:  GLUCOTROL XL Take 1 tablet (2.5 mg total) by mouth daily with breakfast.   hydrochlorothiazide 25 MG tablet Commonly known as:  HYDRODIURIL TAKE ONE (1) TABLET EACH DAY What changed:    how much to take  how to take this  when to take this  additional instructions   Insulin Glargine 100 UNIT/ML Solostar Pen Commonly known as:  LANTUS Inject 15 Units into the skin daily at 10 pm.   Insulin Pen Needle 29G X 12MM Misc USE AS DIRECTED   lovastatin 40 MG tablet Commonly known as:  MEVACOR Take 1 tablet (40 mg total) by mouth daily.   metFORMIN 1000 MG tablet Commonly known as:  GLUCOPHAGE TAKE 1 TABLET BY MOUTH 2 TIMES DAILY WITH A MEAL What changed:    how much to take  how to take this  when to take this  additional instructions   mirtazapine 15 MG tablet Commonly known as:  REMERON Take 1 tablet (15 mg total) by mouth at bedtime.   ONE TOUCH ULTRA SYSTEM KIT w/Device Kit 1 kit by Does not apply route once.   ONE TOUCH ULTRA TEST test strip Generic drug:  glucose blood TEST BLOOD SUGAR THREE TIMES DAILY.   onetouch ultrasoft lancets USE TO TEST BLOOD SUGAR TWICE A DAY AS DIRECTED   phenazopyridine 100 MG tablet Commonly known as:  PYRIDIUM Take 1 tablet (100 mg total) by mouth 3 (three) times daily as needed for pain.   predniSONE 50 MG tablet Commonly known as:  DELTASONE Take 1 tablet (50 mg total) by mouth daily with breakfast.   pseudoephedrine 30 MG tablet Commonly known as:  SUDAFED Take 1 tablet (30 mg total) by mouth every 6 (six) hours as needed for congestion.   sildenafil 20 MG tablet Commonly known as:  REVATIO 2-5 tabs 1  hour prior to intercourse   silodosin 8 MG Caps capsule Commonly known as:  RAPAFLO Take 1 capsule (8 mg total) by mouth daily with breakfast.   traMADol 50 MG tablet Commonly known as:  ULTRAM Take 1 tablet (50 mg total) by mouth every 6 (six) hours as needed.   traZODone 100 MG tablet Commonly known as:  DESYREL Take 1 tablet (100 mg total) by mouth at bedtime.   valACYclovir 1000 MG tablet Commonly known as:  VALTREX Take 1 tablet (1,000 mg  total) by mouth daily as needed. What changed:  reasons to take this       Diagnostic Studies: Dg C-arm 1-60 Min-no Report  Result Date: 03/22/2018 Fluoroscopy was utilized by the requesting physician.  No radiographic interpretation.    Disposition: Discharge disposition: 01-Home or Self Care         Follow-up Information    Lucas Mallow, MD.   Specialty:  Urology Why:  If not scheduled, please call the office for an appointment for 2-3 weeks.  Contact information: [place] [id number] [phone]            Signed: Irine Seal 03/23/2018, 1:41 PM

## 2018-03-23 NOTE — Plan of Care (Signed)
Discharge instructions reviewed with patient, questions answered, verbalized understanding.  Patient given prescription for Augmentin and Tramadol with instructions on how to take these.  Patient ambulatory down to main entrance where shuttle has his keys and will help him find his car.  Patient driving himself home.

## 2018-03-23 NOTE — Progress Notes (Signed)
Patient dressed stating he is ready to be discharged.  I told patient that he has to void prior to discharge, patient showed this RN his bedpan which had a small amount of blood and some urine mixed together.  Patient states he has voided and is insistent that he leave.  RN attempted to have patient remain and void more prior to discharge but patient is unwilling to do so.

## 2018-03-23 NOTE — Progress Notes (Signed)
Patient with wide fluctuation in behavior, from argumentative to pleasant quickly.  Still refuses any medication.

## 2018-03-23 NOTE — Progress Notes (Signed)
Patient very argumentative, won't answer any questions by RN or let RN assess him.  Very upset, says he won't take his medications because of the high charge the hospital charges people.  Demanding to speak to someone about his bill, I informed patient we will have case management come and speak to him when they arrive at work.  Patient very upset, this RN can not determine why.

## 2018-03-23 NOTE — Care Management Obs Status (Addendum)
[facility] NOTIFICATION   Patient Details  Name: Rick [name] MRN: [id number] Date of Birth: [birth year]   Medicare Observation Status Notification Given:  yes   Erenest Rasher, RN 03/23/2018, 1:28 PM

## 2018-03-28 NOTE — Addendum Note (Signed)
Addendum  created 03/28/18 1819 by Claudia Desanctis, CRNA   Intraprocedure Event edited, Intraprocedure Meds edited

## 2018-04-16 ENCOUNTER — Other Ambulatory Visit: Payer: Self-pay

## 2018-04-16 NOTE — Patient Outreach (Signed)
[facility]) Care Management  04/16/2018  EXZAVIER RUDERMAN 12-19-1940 [id number]   Medication Adherence call to Mr. Chipper Oman spoke with patient he did not want to provide with any information and did not want to engage patient is due on Atorvastatin 40 mg and Enalepril 10 mg. Mr. Smokey is showing past due under [place].  [facility] Management Direct Dial [phone]  Fax [phone] [email or web address]

## 2018-04-19 DIAGNOSIS — N3020 Other chronic cystitis without hematuria: Secondary | ICD-10-CM | POA: Diagnosis not present

## 2018-04-19 DIAGNOSIS — R3914 Feeling of incomplete bladder emptying: Secondary | ICD-10-CM | POA: Diagnosis not present

## 2018-05-06 NOTE — Telephone Encounter (Signed)
Patient has swollen and painful testicle since yesterday. Slightly warm to touch. He has burning with full stream urination for about one week which he stated he sees Dr. Gloriann Loan for ongoing urinary issues and is on a Augmentin 875-125 mg with a limited supply. Advice care reviewed including seeking emergency care today and phoning urologist for appointment.  Patient has a migraine and voiced wanting to wait until tomorrow for an appointment. Only availability is 3:00p tomorrow. No availability today. His voice sounded in distress. Encouraged him to seek treatment today.  Reason for Disposition . [1] Scrotum swelling AND [2] no pain  Additional Information . Pain in scrotum is main symptom    Patient has swollen and painful testicle.  Answer Assessment - Initial Assessment Questions 1. SCROTAL SWELLING: "What does the scrotum look like?" "How swollen is it?" (mild, moderate severe; compare to other side)      Right Testicle swollen and painful 2. LOCATION: "Where is the swelling located?"     Testicle  3. ONSET: "When did the swelling start?"     Yesterday 4. PATTERN: "Does it come and go, or has it been constant since it started?"     Constant.  5. SCROTAL PAIN: "Is there any pain?" If so, ask: "How bad is it?"  (Scale 1-10; or mild, moderate, severe)     4-5 6. HERNIA: "Has a doctor ever told you that you have a hernia?"     no 7. OTHER SYMPTOMS: "Do you have any other symptoms?" (e.g., fever, abdominal pain, vomiting, difficulty passing urine)     Migraines, burning with urination for about one week.  Protocols used: [facility]

## 2018-05-06 NOTE — Telephone Encounter (Signed)
This encounter was created in error - please disregard.

## 2018-05-07 ENCOUNTER — Ambulatory Visit (INDEPENDENT_AMBULATORY_CARE_PROVIDER_SITE_OTHER): Payer: Medicare Other | Admitting: Physician Assistant

## 2018-05-07 ENCOUNTER — Encounter: Payer: Self-pay | Admitting: Physician Assistant

## 2018-05-07 VITALS — BP 132/81 | HR 123 | Temp 98.4°F | Ht 66.0 in | Wt 168.0 lb

## 2018-05-07 DIAGNOSIS — E118 Type 2 diabetes mellitus with unspecified complications: Secondary | ICD-10-CM

## 2018-05-07 DIAGNOSIS — N390 Urinary tract infection, site not specified: Secondary | ICD-10-CM | POA: Diagnosis not present

## 2018-05-07 DIAGNOSIS — N50811 Right testicular pain: Secondary | ICD-10-CM

## 2018-05-07 DIAGNOSIS — R300 Dysuria: Secondary | ICD-10-CM | POA: Diagnosis not present

## 2018-05-07 DIAGNOSIS — Z794 Long term (current) use of insulin: Secondary | ICD-10-CM

## 2018-05-07 LAB — UA/M W/RFLX CULTURE, ROUTINE
Bilirubin, UA: NEGATIVE
Glucose, UA: NEGATIVE
Ketones, UA: NEGATIVE
Nitrite, UA: NEGATIVE
Specific Gravity, UA: 1.015 (ref 1.005–1.030)
Urobilinogen, Ur: 0.2 mg/dL (ref 0.2–1.0)
pH, UA: 7.5 (ref 5.0–7.5)

## 2018-05-07 LAB — MICROSCOPIC EXAMINATION: WBC, UA: >30 /hpf — AB (ref 0–5)

## 2018-05-07 MED ORDER — AMOXICILLIN-POT CLAVULANATE 875-125 MG PO TABS: 1.0000 | ORAL_TABLET | Freq: Two times a day (BID) | ORAL | 0 refills | Status: AC

## 2018-05-07 MED ORDER — MELOXICAM 7.5 MG PO TABS: 7.5000 mg | ORAL_TABLET | Freq: Every day | ORAL | 0 refills | Status: AC

## 2018-05-07 NOTE — Progress Notes (Signed)
Subjective:    Patient ID: Rick [name], male    DOB: [birth year], 77 y.o.   MRN: [id number]  Rick [name] is a 77 y.o. male presenting on 05/07/2018 for Urinary Tract Infection (pt states he has had burning with urination for a few weeks) and Testicle Pain (pt states his right testicle has been swollen and painful for the past 3 days )   HPI   History of well controlled DM, prostatic hyperplasia, recurrent UTI presenting today with right testicular pain. He reports this started gradually several days ago. He reports his right testicle is twice the size of his left testicle. He denies nausea and vomiting. He denies penile discharge. He denies sexual activity in the past year. He is followed by urologist Dr. Gloriann Loan at [facility]. He recently had a cystoscopy at 03/22/2018 as well as left ureteroscopy and transurethral resection of prostate, underwent catheterization at this procedure.   Additionally, he reports dysuria. Denies fevers, chills, nausea, vomiting.   Social History   Tobacco Use  . Smoking status: Former Smoker    Packs/day: 1.50    Years: 15.00    Pack years: 22.50    Types: Cigarettes    Start date: 08/14/1994    Last attempt to quit: 03/23/2005    Years since quitting: 13.1  . Smokeless tobacco: Former User    Types: Snuff, Sarina Ser    Quit date: 03/23/2010  Substance Use Topics  . Alcohol use: No    Alcohol/week: 0.0 standard drinks  . Drug use: No    Review of Systems Per HPI unless specifically indicated above     Objective:    BP 132/81   Pulse (!) 123   Temp 98.4 F (36.9 C) (Oral)   Ht _0  (1.676 m)   Wt 168 lb (76.2 kg)   SpO2 98%   BMI 27.12 kg/m   Wt Readings from Last 3 Encounters:  05/07/18 168 lb (76.2 kg)  03/22/18 167 lb 6.4 oz (75.9 kg)  02/11/18 166 lb 12.8 oz (75.7 kg)    Physical Exam  Constitutional: He is oriented to person, place, and time. He appears well-developed and well-nourished.  Cardiovascular: Normal rate and  regular rhythm.  Pulmonary/Chest: Effort normal and breath sounds normal.  Genitourinary: Penis normal. Cremasteric reflex is present. Right testis shows tenderness. Right testis shows no mass and no swelling. Right testis is descended. Left testis shows no mass, no swelling and no tenderness. Left testis is descended.  Genitourinary Comments: There is tenderness in the right superior and posterior testicle. There is minimal to no swelling or erythema.   Neurological: He is alert and oriented to person, place, and time.  Skin: Skin is warm and dry.  Psychiatric: He has a normal mood and affect. His behavior is normal.   Results for orders placed or performed in visit on 05/07/18  Microscopic Examination  Result Value Ref Range   WBC, UA >30 (A) 0 - 5 /hpf   RBC, UA 11-30 (A) 0 - 2 /hpf   Epithelial Cells (non renal) 0-10 0 - 10 /hpf   Bacteria, UA Few None seen/Few  UA/M w/rflx Culture, Routine  Result Value Ref Range   Specific Gravity, UA 1.015 1.005 - 1.030   pH, UA 7.5 5.0 - 7.5   Color, UA Yellow Yellow   Appearance Ur Turbid (A) Clear   Leukocytes, UA 2+ (A) Negative   Protein, UA 1+ (A) Negative/Trace   Glucose, UA Negative Negative  Ketones, UA Negative Negative   RBC, UA 2+ (A) Negative   Bilirubin, UA Negative Negative   Urobilinogen, Ur 0.2 0.2 - 1.0 mg/dL   Nitrite, UA Negative Negative   Microscopic Examination See below:   Lipid Profile  Result Value Ref Range   Cholesterol, Total 139 100 - 199 mg/dL   Triglycerides 119 0 - 149 mg/dL   HDL 51 >39 mg/dL   VLDL Cholesterol Cal 24 5 - 40 mg/dL   LDL Calculated 64 0 - 99 mg/dL   Chol/HDL Ratio 2.7 0.0 - 5.0 ratio  HgB A1c  Result Value Ref Range   Hgb A1c MFr Bld 5.9 (H) 4.8 - 5.6 %   Est. average glucose Bld gHb Est-mCnc 123 mg/dL  Comp Met (CMET)  Result Value Ref Range   Glucose 102 (H) 65 - 99 mg/dL   BUN 15 8 - 27 mg/dL   Creatinine, Ser 1.09 0.76 - 1.27 mg/dL   GFR calc non Af Amer 66 >59 mL/min/1.73    GFR calc Af Amer 76 >59 mL/min/1.73   BUN/Creatinine Ratio 14 10 - 24   Sodium 137 134 - 144 mmol/L   Potassium 3.9 3.5 - 5.2 mmol/L   Chloride 101 96 - 106 mmol/L   CO2 18 (L) 20 - 29 mmol/L   Calcium 9.4 8.6 - 10.2 mg/dL   Total Protein 6.8 6.0 - 8.5 g/dL   Albumin 4.3 3.5 - 4.8 g/dL   Globulin, Total 2.5 1.5 - 4.5 g/dL   Albumin/Globulin Ratio 1.7 1.2 - 2.2   Bilirubin Total 1.4 (H) 0.0 - 1.2 mg/dL   Alkaline Phosphatase 47 39 - 117 IU/L   AST 18 0 - 40 IU/L   ALT 10 0 - 44 IU/L      Assessment & Plan:  1. Burning with urination  - UA/M w/rflx Culture, Routine - Urine Culture - amoxicillin-clavulanate (AUGMENTIN) 875-125 MG tablet; Take 1 tablet by mouth 2 (two) times daily for 10 days.  Dispense: 20 tablet; Refill: 0  2. Right testicular pain  Will treat for epididymitis with anti-inflammatories. Think he is low risk for STI due to sexual inactivity. Should follow up with his urologist for further evaluation.   - meloxicam (MOBIC) 7.5 MG tablet; Take 1 tablet (7.5 mg total) by mouth daily for 14 days.  Dispense: 14 tablet; Refill: 0  3. Recurrent UTI  Treat as below, has history of recurrent and multi-drug resistant UTIs.   - amoxicillin-clavulanate (AUGMENTIN) 875-125 MG tablet; Take 1 tablet by mouth 2 (two) times daily for 10 days.  Dispense: 20 tablet; Refill: 0  4. Controlled type 2 diabetes mellitus with complication, with long-term current use of insulin ([place])  Needs to follow up with Dr. Wynetta Emery, will get labwork as below.  - Lipid Profile - HgB A1c - Comp Met (CMET)   Follow up plan: Return in about 2 weeks (around 05/21/2018) for DM with Dr. Wynetta Emery .  Carles Collet, PA-C [facility] 05/08/2018, 11:49 AM

## 2018-05-07 NOTE — Patient Instructions (Signed)
Epididymitis Epididymitis is swelling (inflammation) of the epididymis. The epididymis is a cord-like structure that is located along the top and back part of the testicle. It collects and stores sperm from the testicle. This condition can also cause pain and swelling of the testicle and scrotum. Symptoms usually start suddenly (acute epididymitis). Sometimes epididymitis starts gradually and lasts for a while (chronic epididymitis). This type may be harder to treat. What are the causes? In men 35 and younger, this condition is usually caused by a bacterial infection or sexually transmitted disease (STD), such as:  Gonorrhea.  Chlamydia.  In men 35 and older who do not have anal sex, this condition is usually caused by bacteria from a blockage or abnormalities in the urinary system. These can result from:  Having a tube placed into the bladder (urinary catheter).  Having an enlarged or inflamed prostate gland.  Having recent urinary tract surgery.  In men who have a condition that weakens the body's defense system (immune system), such as HIV, this condition can be caused by:  Other bacteria, including tuberculosis and syphilis.  Viruses.  Fungi.  Sometimes this condition occurs without infection. That may happen if urine flows backward into the epididymis after heavy lifting or straining. What increases the risk? This condition is more likely to develop in men:  Who have unprotected sex with more than one partner.  Who have anal sex.  Who have recently had surgery.  Who have a urinary catheter.  Who have urinary problems.  Who have a suppressed immune system.  What are the signs or symptoms? This condition usually begins suddenly with chills, fever, and pain behind the scrotum and in the testicle. Other symptoms include:  Swelling of the scrotum, testicle, or both.  Pain whenejaculatingor urinating.  Pain in the back or belly.  Nausea.  Itching and discharge  from the penis.  Frequent need to pass urine.  Redness and tenderness of the scrotum.  How is this diagnosed? Your health care provider can diagnose this condition based on your symptoms and medical history. Your health care provider will also do a physical exam to ask about your symptoms and check your scrotum and testicle for swelling, pain, and redness. You may also have other tests, including:  Examination of discharge from the penis.  Urine tests for infections, such as STDs.  Your health care provider may test you for other STDs, including HIV. How is this treated? Treatment for this condition depends on the cause. If your condition is caused by a bacterial infection, oral antibiotic medicine may be prescribed. If the bacterial infection has spread to your blood, you may need to receive IV antibiotics. Nonbacterial epididymitis is treated with home care that includes bed rest and elevation of the scrotum. Surgery may be needed to treat:  Bacterial epididymitis that causes pus to build up in the scrotum (abscess).  Chronic epididymitis that has not responded to other treatments.  Follow these instructions at home: Medicines  Take over-the-counter and prescription medicines only as told by your health care provider.  If you were prescribed an antibiotic medicine, take it as told by your health care provider. Do not stop taking the antibiotic even if your condition improves. Sexual Activity  If your epididymitis was caused by an STD, avoid sexual activity until your treatment is complete.  Inform your sexual partner or partners if you test positive for an STD. They may need to be treated.Do not engage in sexual activity with your partner or   partners until their treatment is completed. General instructions  Return to your normal activities as told by your health care provider. Ask your health care provider what activities are safe for you.  Keep your scrotum elevated and  supported while resting. Ask your health care provider if you should wear a scrotal support, such as a jockstrap. Wear it as told by your health care provider.  If directed, apply ice to the affected area: ? Put ice in a plastic bag. ? Place a towel between your skin and the bag. ? Leave the ice on for 20 minutes, 2-3 times per day.  Try taking a sitz bath to help with discomfort. This is a warm water bath that is taken while you are sitting down. The water should only come up to your hips and should cover your buttocks. Do this 3-4 times per day or as told by your health care provider.  Keep all follow-up visits as told by your health care provider. This is important. Contact a health care provider if:  You have a fever.  Your pain medicine is not helping.  Your pain is getting worse.  Your symptoms do not improve within three days. This information is not intended to replace advice given to you by your health care provider. Make sure you discuss any questions you have with your health care provider. Document Released: 07/28/2000 Document Revised: 01/06/2016 Document Reviewed: 12/16/2014 Elsevier Interactive Patient Education  2018 Elsevier Inc.  

## 2018-05-08 LAB — COMPREHENSIVE METABOLIC PANEL
ALT: 10 IU/L (ref 0–44)
AST: 18 IU/L (ref 0–40)
Albumin/Globulin Ratio: 1.7 (ref 1.2–2.2)
Albumin: 4.3 g/dL (ref 3.5–4.8)
Alkaline Phosphatase: 47 IU/L (ref 39–117)
BUN/Creatinine Ratio: 14 (ref 10–24)
BUN: 15 mg/dL (ref 8–27)
Bilirubin Total: 1.4 mg/dL — ABNORMAL HIGH (ref 0.0–1.2)
CO2: 18 mmol/L — ABNORMAL LOW (ref 20–29)
Calcium: 9.4 mg/dL (ref 8.6–10.2)
Chloride: 101 mmol/L (ref 96–106)
Creatinine, Ser: 1.09 mg/dL (ref 0.76–1.27)
GFR calc Af Amer: 76 mL/min/{1.73_m2} (ref >59)
GFR calc non Af Amer: 66 mL/min/{1.73_m2} (ref >59)
Globulin, Total: 2.5 g/dL (ref 1.5–4.5)
Glucose: 102 mg/dL — ABNORMAL HIGH (ref 65–99)
Potassium: 3.9 mmol/L (ref 3.5–5.2)
Sodium: 137 mmol/L (ref 134–144)
Total Protein: 6.8 g/dL (ref 6.0–8.5)

## 2018-05-08 LAB — LIPID PANEL
Chol/HDL Ratio: 2.7 ratio (ref 0.0–5.0)
Cholesterol, Total: 139 mg/dL (ref 100–199)
HDL: 51 mg/dL (ref >39)
LDL Calculated: 64 mg/dL (ref 0–99)
Triglycerides: 119 mg/dL (ref 0–149)
VLDL Cholesterol Cal: 24 mg/dL (ref 5–40)

## 2018-05-08 LAB — HEMOGLOBIN A1C
Est. average glucose Bld gHb Est-mCnc: 123 mg/dL
Hgb A1c MFr Bld: 5.9 % — ABNORMAL HIGH (ref 4.8–5.6)

## 2018-05-09 LAB — URINE CULTURE: Organism ID, Bacteria: NO GROWTH

## 2018-05-27 NOTE — Assessment & Plan Note (Signed)
Under good control on current regimen. Continue current regimen. Continue to monitor. Call with any concerns. Refills given.   

## 2018-05-27 NOTE — Assessment & Plan Note (Signed)
Under good control on current regimen with A1c of 5.9. Continue current regimen. Continue to monitor. Call with any concerns. Refills given.   

## 2018-05-27 NOTE — Progress Notes (Signed)
BP 137/87   Pulse 100   Temp (!) 97.5 F (36.4 C) (Oral)   Ht '5\' 6"'  (1.676 m)   Wt 162 lb (73.5 kg)   SpO2 93%   BMI 26.15 kg/m    Subjective:    Patient ID: Rick [name], male    DOB: 1941-02-09, 77 y.o.   MRN: [id number]  HPI: Rick [name] is a 77 y.o. male  Chief Complaint  Patient presents with  . Hypertension  . Hyperlipidemia  . Diabetes   DIABETES Hypoglycemic episodes:no Polydipsia/polyuria: no Visual disturbance: no Chest pain: no Paresthesias: yes Glucose Monitoring: no  Accucheck frequency: Not Checking Taking Insulin?: no Blood Pressure Monitoring: not checking Retinal Examination: Up to Date Foot Exam: Up to Date Diabetic Education: Completed Pneumovax: Refused Influenza: REfused Aspirin: no  HYPERTENSION / HYPERLIPIDEMIA Satisfied with current treatment? yes Duration of hypertension: chronic BP monitoring frequency: not checking BP medication side effects: no Past BP meds: enlalapril Duration of hyperlipidemia: chronic Cholesterol medication side effects: no Cholesterol supplements: none Past cholesterol medications: lovastatin Medication compliance: excellent compliance Aspirin: no Recent stressors: no Recurrent headaches: no Visual changes: no Palpitations: no Dyspnea: no Chest pain: no Lower extremity edema: no Dizzy/lightheaded: no   Tremor seems to be getting worse. Was supposed to have EMG and follow up in 3 months. He did not do this and is very concerned. He is upset as he doesn't know what's going on and states that this is greatly affecting his quality of life.  Relevant past medical, surgical, family and social history reviewed and updated as indicated. Interim medical history since our last visit reviewed. Allergies and medications reviewed and updated.  Review of Systems  Constitutional: Negative.   Respiratory: Negative.   Cardiovascular: Negative.   Neurological: Positive for tremors. Negative for dizziness,  seizures, syncope, facial asymmetry, speech difficulty, weakness, light-headedness, numbness and headaches.  Psychiatric/Behavioral: Negative.     Per HPI unless specifically indicated above     Objective:    BP 137/87   Pulse 100   Temp (!) 97.5 F (36.4 C) (Oral)   Ht '5\' 6"'  (1.676 m)   Wt 162 lb (73.5 kg)   SpO2 93%   BMI 26.15 kg/m   Wt Readings from Last 3 Encounters:  05/28/18 162 lb (73.5 kg)  05/07/18 168 lb (76.2 kg)  03/22/18 167 lb 6.4 oz (75.9 kg)    Physical Exam  Constitutional: He is oriented to person, place, and time. He appears well-developed and well-nourished. No distress.  HENT:  Head: Normocephalic and atraumatic.  Right Ear: Hearing normal.  Left Ear: Hearing normal.  Nose: Nose normal.  Eyes: Conjunctivae and lids are normal. Right eye exhibits no discharge. Left eye exhibits no discharge. No scleral icterus.  Cardiovascular: Normal rate, regular rhythm, normal heart sounds and intact distal pulses. Exam reveals no gallop and no friction rub.  No murmur heard. Pulmonary/Chest: Effort normal and breath sounds normal. No stridor. No respiratory distress. He has no wheezes. He has no rales. He exhibits no tenderness.  Musculoskeletal: Normal range of motion.  Neurological: He is alert and oriented to person, place, and time. He exhibits abnormal muscle tone.  Tremor throughout  Skin: Skin is warm, dry and intact. Capillary refill takes less than 2 seconds. No rash noted. He is not diaphoretic. No erythema. No pallor.  Psychiatric: He has a normal mood and affect. His speech is normal and behavior is normal. Judgment and thought content normal. Cognition and memory are  normal.    Results for orders placed or performed in visit on 05/07/18  Urine Culture  Result Value Ref Range   Urine Culture, Routine Final report    Organism ID, Bacteria No growth   Microscopic Examination  Result Value Ref Range   WBC, UA >30 (A) 0 - 5 /hpf   RBC, UA 11-30 (A) 0 -  2 /hpf   Epithelial Cells (non renal) 0-10 0 - 10 /hpf   Bacteria, UA Few None seen/Few  UA/M w/rflx Culture, Routine  Result Value Ref Range   Specific Gravity, UA 1.015 1.005 - 1.030   pH, UA 7.5 5.0 - 7.5   Color, UA Yellow Yellow   Appearance Ur Turbid (A) Clear   Leukocytes, UA 2+ (A) Negative   Protein, UA 1+ (A) Negative/Trace   Glucose, UA Negative Negative   Ketones, UA Negative Negative   RBC, UA 2+ (A) Negative   Bilirubin, UA Negative Negative   Urobilinogen, Ur 0.2 0.2 - 1.0 mg/dL   Nitrite, UA Negative Negative   Microscopic Examination See below:   Lipid Profile  Result Value Ref Range   Cholesterol, Total 139 100 - 199 mg/dL   Triglycerides 119 0 - 149 mg/dL   HDL 51 >39 mg/dL   VLDL Cholesterol Cal 24 5 - 40 mg/dL   LDL Calculated 64 0 - 99 mg/dL   Chol/HDL Ratio 2.7 0.0 - 5.0 ratio  HgB A1c  Result Value Ref Range   Hgb A1c MFr Bld 5.9 (H) 4.8 - 5.6 %   Est. average glucose Bld gHb Est-mCnc 123 mg/dL  Comp Met (CMET)  Result Value Ref Range   Glucose 102 (H) 65 - 99 mg/dL   BUN 15 8 - 27 mg/dL   Creatinine, Ser 1.09 0.76 - 1.27 mg/dL   GFR calc non Af Amer 66 >59 mL/min/1.73   GFR calc Af Amer 76 >59 mL/min/1.73   BUN/Creatinine Ratio 14 10 - 24   Sodium 137 134 - 144 mmol/L   Potassium 3.9 3.5 - 5.2 mmol/L   Chloride 101 96 - 106 mmol/L   CO2 18 (L) 20 - 29 mmol/L   Calcium 9.4 8.6 - 10.2 mg/dL   Total Protein 6.8 6.0 - 8.5 g/dL   Albumin 4.3 3.5 - 4.8 g/dL   Globulin, Total 2.5 1.5 - 4.5 g/dL   Albumin/Globulin Ratio 1.7 1.2 - 2.2   Bilirubin Total 1.4 (H) 0.0 - 1.2 mg/dL   Alkaline Phosphatase 47 39 - 117 IU/L   AST 18 0 - 40 IU/L   ALT 10 0 - 44 IU/L      Assessment & Plan:   Problem List Items Addressed This Visit      Cardiovascular and Mediastinum   Hypertension - Primary    Under good control on current regimen. Continue current regimen. Continue to monitor. Call with any concerns. Refills given.        Relevant Medications    carvedilol (COREG) 6.25 MG tablet   enalapril (VASOTEC) 10 MG tablet   hydrochlorothiazide (HYDRODIURIL) 25 MG tablet   lovastatin (MEVACOR) 40 MG tablet     Endocrine   Diabetic peripheral neuropathy associated with type 2 diabetes mellitus ([place])    Under good control on current regimen with A1c of 5.9. Continue current regimen. Continue to monitor. Call with any concerns. Refills given.        Relevant Medications   enalapril (VASOTEC) 10 MG tablet   gabapentin (NEURONTIN) 300 MG capsule  Insulin Glargine (LANTUS SOLOSTAR) 100 UNIT/ML Solostar Pen   lovastatin (MEVACOR) 40 MG tablet   metFORMIN (GLUCOPHAGE) 1000 MG tablet   mirtazapine (REMERON) 15 MG tablet   glipiZIDE (GLUCOTROL XL) 2.5 MG 24 hr tablet   traZODone (DESYREL) 100 MG tablet     Genitourinary   Benign fibroma of prostate   Relevant Medications   finasteride (PROSCAR) 5 MG tablet   silodosin (RAPAFLO) 8 MG CAPS capsule     Other   Hyperlipidemia    Under good control on current regimen. Continue current regimen. Continue to monitor. Call with any concerns. Refills given.        Relevant Medications   carvedilol (COREG) 6.25 MG tablet   enalapril (VASOTEC) 10 MG tablet   hydrochlorothiazide (HYDRODIURIL) 25 MG tablet   lovastatin (MEVACOR) 40 MG tablet   Tremor    He is very concerned about this. Has an appointment to see neurology on 10/22- reminded him of the appointment and discussed the importance to follow up with neurology. Call with any concerns.        Other Visit Diagnoses    Dyslipidemia       Relevant Medications   lovastatin (MEVACOR) 40 MG tablet       Follow up plan: Return in about 6 months (around 11/27/2018) for Physical/wellness.

## 2018-05-28 ENCOUNTER — Ambulatory Visit (INDEPENDENT_AMBULATORY_CARE_PROVIDER_SITE_OTHER): Payer: Medicare Other | Admitting: Family Medicine

## 2018-05-28 ENCOUNTER — Other Ambulatory Visit: Payer: Self-pay

## 2018-05-28 ENCOUNTER — Encounter: Payer: Self-pay | Admitting: Family Medicine

## 2018-05-28 VITALS — BP 137/87 | HR 100 | Temp 97.5°F | Ht 66.0 in | Wt 162.0 lb

## 2018-05-28 DIAGNOSIS — R251 Tremor, unspecified: Secondary | ICD-10-CM | POA: Diagnosis not present

## 2018-05-28 DIAGNOSIS — N400 Enlarged prostate without lower urinary tract symptoms: Secondary | ICD-10-CM

## 2018-05-28 DIAGNOSIS — E1142 Type 2 diabetes mellitus with diabetic polyneuropathy: Secondary | ICD-10-CM | POA: Diagnosis not present

## 2018-05-28 DIAGNOSIS — I10 Essential (primary) hypertension: Secondary | ICD-10-CM | POA: Diagnosis not present

## 2018-05-28 DIAGNOSIS — E785 Hyperlipidemia, unspecified: Secondary | ICD-10-CM

## 2018-05-28 DIAGNOSIS — E782 Mixed hyperlipidemia: Secondary | ICD-10-CM

## 2018-05-28 MED ORDER — FINASTERIDE 5 MG PO TABS: 5.0000 mg | ORAL_TABLET | Freq: Every day | ORAL | 1 refills | Status: DC

## 2018-05-28 MED ORDER — GABAPENTIN 300 MG PO CAPS: 600.0000 mg | ORAL_CAPSULE | Freq: Two times a day (BID) | ORAL | 1 refills | Status: DC

## 2018-05-28 MED ORDER — GLIPIZIDE ER 2.5 MG PO TB24: 2.5000 mg | ORAL_TABLET | Freq: Every day | ORAL | 1 refills | Status: DC

## 2018-05-28 MED ORDER — INSULIN GLARGINE 100 UNIT/ML SOLOSTAR PEN: 15.0000 [IU] | PEN_INJECTOR | Freq: Every day | SUBCUTANEOUS | 1 refills | Status: DC

## 2018-05-28 MED ORDER — METFORMIN HCL 1000 MG PO TABS: 1000.0000 mg | ORAL_TABLET | Freq: Two times a day (BID) | ORAL | 1 refills | Status: DC

## 2018-05-28 MED ORDER — HYDROCHLOROTHIAZIDE 25 MG PO TABS: ORAL_TABLET | ORAL | 1 refills | Status: DC

## 2018-05-28 MED ORDER — FLUTICASONE PROPIONATE 50 MCG/ACT NA SUSP: 2.0000 | Freq: Two times a day (BID) | NASAL | 6 refills | Status: DC

## 2018-05-28 MED ORDER — INSULIN PEN NEEDLE 29G X 12MM MISC: 12 refills | Status: DC

## 2018-05-28 MED ORDER — TRAZODONE HCL 100 MG PO TABS: 100.0000 mg | ORAL_TABLET | Freq: Every day | ORAL | 1 refills | Status: DC

## 2018-05-28 MED ORDER — MIRTAZAPINE 15 MG PO TABS: 15.0000 mg | ORAL_TABLET | Freq: Every day | ORAL | 1 refills | Status: DC

## 2018-05-28 MED ORDER — CARVEDILOL 6.25 MG PO TABS: 6.2500 mg | ORAL_TABLET | Freq: Two times a day (BID) | ORAL | 1 refills | Status: DC

## 2018-05-28 MED ORDER — SILODOSIN 8 MG PO CAPS: 8.0000 mg | ORAL_CAPSULE | Freq: Every day | ORAL | 2 refills | Status: DC

## 2018-05-28 MED ORDER — ENALAPRIL MALEATE 10 MG PO TABS: 10.0000 mg | ORAL_TABLET | Freq: Every day | ORAL | 1 refills | Status: DC

## 2018-05-28 MED ORDER — LOVASTATIN 40 MG PO TABS: 40.0000 mg | ORAL_TABLET | Freq: Every day | ORAL | 1 refills | Status: DC

## 2018-05-28 NOTE — Assessment & Plan Note (Signed)
Under good control on current regimen. Continue current regimen. Continue to monitor. Call with any concerns. Refills given.   

## 2018-05-28 NOTE — Assessment & Plan Note (Signed)
He is very concerned about this. Has an appointment to see neurology on 10/22- reminded him of the appointment and discussed the importance to follow up with neurology. Call with any concerns.

## 2018-05-28 NOTE — Patient Instructions (Signed)
[facility] [address] [place], [place], [place] [address]  Phone: [phone] Kristine Linea, NP Tuesday 06/04/18 12:30PM

## 2018-06-04 DIAGNOSIS — R251 Tremor, unspecified: Secondary | ICD-10-CM | POA: Diagnosis not present

## 2018-07-10 ENCOUNTER — Telehealth: Payer: Self-pay | Admitting: *Deleted

## 2018-07-10 NOTE — Telephone Encounter (Signed)
Contacted as requested to discuss lung screening scan that patient has been referred for. Patient requests to be contacted at a later date.

## 2018-07-16 ENCOUNTER — Telehealth: Payer: Self-pay | Admitting: *Deleted

## 2018-07-16 NOTE — Telephone Encounter (Signed)
Received referral for low dose lung cancer screening CT scan. Message left at phone number listed in EMR for patient to call me back to facilitate scheduling scan.  

## 2018-07-17 ENCOUNTER — Encounter: Payer: Self-pay | Admitting: *Deleted

## 2018-07-26 ENCOUNTER — Telehealth: Payer: Self-pay | Admitting: Family Medicine

## 2018-07-26 NOTE — Telephone Encounter (Signed)
Copied from [place] [id number]. Topic: Quick Communication - See Telephone Encounter >> Jul 26, 2018  3:50 PM Bea Graff, NT wrote: CRM for notification. See Telephone encounter for: 07/26/18. Pt requesting an order for a chest xray. States that [facility] wanted him to have one due to being a smoker at one time. He states he is unsure of who the people are at [facility] he was referred to by Dr. Wynetta Emery.

## 2018-07-29 NOTE — Telephone Encounter (Signed)
Verbal from Dr.Johnson, that if patient is currently having chest pain that he needs to go to the ER. Called to notify patient that he needs to go to the ER, patient states that he is not going to the ER, he will see how it is tomorrow or Wednesday, and that if the pain gets worse he may go to urgent care. Patient checked his BP while on the phone, it was 110's/ 70's with a pulse of 91. Patient will call back tomorrow and give an update of the chest pain.

## 2018-07-29 NOTE — Telephone Encounter (Signed)
Needs to go to ER if having chest pain.

## 2018-07-29 NOTE — Telephone Encounter (Signed)
We don't recommend CXRs for screening for lung cancer. However, he may qualify for a low dose CT of his chest. Would he like me to have the nurse who does those call him to see if he does?

## 2018-07-29 NOTE — Telephone Encounter (Signed)
Called and spoke with patient. He was irate. States he does not care about the lung cancer screen. To disregard the whole message. While on the phone patient stated he is having chest pains and has been since yesterday. Patient stated it was constant and on his left side. Unable to get anymore information than that out of patient has he continued to speak over me and yell.

## 2018-08-13 ENCOUNTER — Encounter: Payer: Self-pay | Admitting: Nurse Practitioner

## 2018-08-13 ENCOUNTER — Ambulatory Visit (INDEPENDENT_AMBULATORY_CARE_PROVIDER_SITE_OTHER): Payer: Medicare Other | Admitting: Nurse Practitioner

## 2018-08-13 VITALS — BP 107/70 | HR 86 | Temp 97.6°F | Wt 163.6 lb

## 2018-08-13 DIAGNOSIS — N3000 Acute cystitis without hematuria: Secondary | ICD-10-CM

## 2018-08-13 DIAGNOSIS — R300 Dysuria: Secondary | ICD-10-CM | POA: Diagnosis not present

## 2018-08-13 MED ORDER — CEFPODOXIME PROXETIL 100 MG PO TABS: 100.0000 mg | ORAL_TABLET | Freq: Two times a day (BID) | ORAL | 0 refills | Status: DC

## 2018-08-13 NOTE — Patient Instructions (Signed)
Urinary Tract Infection, Adult A urinary tract infection (UTI) is an infection of any part of the urinary tract. The urinary tract includes:  The kidneys.  The ureters.  The bladder.  The urethra. These organs make, store, and get rid of pee (urine) in the body. What are the causes? This is caused by germs (bacteria) in your genital area. These germs grow and cause swelling (inflammation) of your urinary tract. What increases the risk? You are more likely to develop this condition if:  You have a small, thin tube (catheter) to drain pee.  You cannot control when you pee or poop (incontinence).  You are male, and: ? You use these methods to prevent pregnancy: ? A medicine that kills sperm (spermicide). ? A device that blocks sperm (diaphragm). ? You have low levels of a male hormone (estrogen). ? You are pregnant.  You have genes that add to your risk.  You are sexually active.  You take antibiotic medicines.  You have trouble peeing because of: ? A prostate that is bigger than normal, if you are male. ? A blockage in the part of your body that drains pee from the bladder (urethra). ? A kidney stone. ? A nerve condition that affects your bladder (neurogenic bladder). ? Not getting enough to drink. ? Not peeing often enough.  You have other conditions, such as: ? Diabetes. ? A weak disease-fighting system (immune system). ? Sickle cell disease. ? Gout. ? Injury of the spine. What are the signs or symptoms? Symptoms of this condition include:  Needing to pee right away (urgently).  Peeing often.  Peeing small amounts often.  Pain or burning when peeing.  Blood in the pee.  Pee that smells bad or not like normal.  Trouble peeing.  Pee that is cloudy.  Fluid coming from the vagina, if you are male.  Pain in the belly or lower back. Other symptoms include:  Throwing up (vomiting).  No urge to eat.  Feeling mixed up (confused).  Being tired  and grouchy (irritable).  A fever.  Watery poop (diarrhea). How is this treated? This condition may be treated with:  Antibiotic medicine.  Other medicines.  Drinking enough water. Follow these instructions at home:  Medicines  Take over-the-counter and prescription medicines only as told by your doctor.  If you were prescribed an antibiotic medicine, take it as told by your doctor. Do not stop taking it even if you start to feel better. General instructions  Make sure you: ? Pee until your bladder is empty. ? Do not hold pee for a long time. ? Empty your bladder after sex. ? Wipe from front to back after pooping if you are a male. Use each tissue one time when you wipe.  Drink enough fluid to keep your pee pale yellow.  Keep all follow-up visits as told by your doctor. This is important. Contact a doctor if:  You do not get better after 1-2 days.  Your symptoms go away and then come back. Get help right away if:  You have very bad back pain.  You have very bad pain in your lower belly.  You have a fever.  You are sick to your stomach (nauseous).  You are throwing up. Summary  A urinary tract infection (UTI) is an infection of any part of the urinary tract.  This condition is caused by germs in your genital area.  There are many risk factors for a UTI. These include having a small, thin   tube to drain pee and not being able to control when you pee or poop.  Treatment includes antibiotic medicines for germs.  Drink enough fluid to keep your pee pale yellow. This information is not intended to replace advice given to you by your health care provider. Make sure you discuss any questions you have with your health care provider. Document Released: 01/17/2008 Document Revised: 02/07/2018 Document Reviewed: 02/07/2018 Elsevier Interactive Patient Education  2019 [employer]  

## 2018-08-13 NOTE — Progress Notes (Signed)
BP 107/70   Pulse 86   Temp 97.6 F (36.4 C) (Oral)   Wt 163 lb 9.6 oz (74.2 kg)   SpO2 95%   BMI 26.41 kg/m    Subjective:    Patient ID: Rick [name], male    DOB: [birth year], 77 y.o.   MRN: [id number]  HPI: Rick [name] is a 77 y.o. male presents for urinary symptoms  Chief Complaint  Patient presents with  . Abdominal Pain    pt states he thinks the metformin is upsetting his stomach   . Urinary Tract Infection    pt states he has been having some burning with urination   URINARY SYMPTOMS Has had abdominal upset and UTI.   Dysuria: burning Urinary frequency: yes Urgency: yes Small volume voids: yes Symptom severity: yes Urinary incontinence: no Foul odor: yes Hematuria: no Abdominal pain: yes Back pain: no Suprapubic pain/pressure: yes Flank pain: no Fever:  no Vomiting: yes Relief with cranberry juice: yes Relief with pyridium: no Status: stable Previous urinary tract infection: yes, July Recurrent urinary tract infection: yes Sexual activity: No sexually active/monogomous/practicing safe sex History of sexually transmitted disease: no Penile discharge: no Treatments attempted: cranberry and increasing fluids   Relevant past medical, surgical, family and social history reviewed and updated as indicated. Interim medical history since our last visit reviewed. Allergies and medications reviewed and updated.  Review of Systems  Constitutional: Negative for activity change, diaphoresis, fatigue and fever.  Respiratory: Negative for cough, chest tightness, shortness of breath and wheezing.   Cardiovascular: Negative for chest pain, palpitations and leg swelling.  Gastrointestinal: Negative for abdominal distention, abdominal pain, constipation, diarrhea, nausea and vomiting.  Endocrine: Negative for cold intolerance, heat intolerance, polydipsia, polyphagia and polyuria.  Genitourinary: Positive for dysuria, frequency and urgency. Negative for  difficulty urinating and hematuria.  Musculoskeletal: Negative.   Skin: Negative.   Neurological: Negative for dizziness, syncope, weakness, light-headedness, numbness and headaches.  Psychiatric/Behavioral: Negative.     Per HPI unless specifically indicated above     Objective:    BP 107/70   Pulse 86   Temp 97.6 F (36.4 C) (Oral)   Wt 163 lb 9.6 oz (74.2 kg)   SpO2 95%   BMI 26.41 kg/m   Wt Readings from Last 3 Encounters:  08/13/18 163 lb 9.6 oz (74.2 kg)  05/28/18 162 lb (73.5 kg)  05/07/18 168 lb (76.2 kg)    Physical Exam Vitals signs and nursing note reviewed.  Constitutional:      General: He is awake.     Appearance: He is well-developed.  HENT:     Head: Normocephalic and atraumatic.     Right Ear: Hearing normal. No drainage.     Left Ear: Hearing normal. No drainage.     Mouth/Throat:     Pharynx: Uvula midline.  Eyes:     General: Lids are normal.        Right eye: No discharge.        Left eye: No discharge.     Conjunctiva/sclera: Conjunctivae normal.     Pupils: Pupils are equal, round, and reactive to light.  Neck:     Musculoskeletal: Normal range of motion and neck supple.     Thyroid: No thyromegaly.     Vascular: No carotid bruit or JVD.     Trachea: Trachea normal.  Cardiovascular:     Rate and Rhythm: Normal rate and regular rhythm.     Heart sounds: Normal heart sounds,  S1 normal and S2 normal. No murmur. No gallop.   Pulmonary:     Effort: Pulmonary effort is normal.     Breath sounds: Normal breath sounds.  Abdominal:     General: Bowel sounds are normal.     Palpations: Abdomen is soft. There is no hepatomegaly or splenomegaly.     Tenderness: There is abdominal tenderness in the suprapubic area. There is no right CVA tenderness, left CVA tenderness, guarding or rebound.  Genitourinary:    Penis: Normal.      Rectum: Normal.  Musculoskeletal: Normal range of motion.  Skin:    General: Skin is warm and dry.     Capillary  Refill: Capillary refill takes less than 2 seconds.     Findings: No rash.  Neurological:     Mental Status: He is alert and oriented to person, place, and time.     Deep Tendon Reflexes: Reflexes are normal and symmetric.  Psychiatric:        Mood and Affect: Mood normal.        Behavior: Behavior normal. Behavior is cooperative.        Thought Content: Thought content normal.        Judgment: Judgment normal.     Results for orders placed or performed in visit on 05/07/18  Urine Culture  Result Value Ref Range   Urine Culture, Routine Final report    Organism ID, Bacteria No growth   Microscopic Examination  Result Value Ref Range   WBC, UA >30 (A) 0 - 5 /hpf   RBC, UA 11-30 (A) 0 - 2 /hpf   Epithelial Cells (non renal) 0-10 0 - 10 /hpf   Bacteria, UA Few None seen/Few  UA/M w/rflx Culture, Routine  Result Value Ref Range   Specific Gravity, UA 1.015 1.005 - 1.030   pH, UA 7.5 5.0 - 7.5   Color, UA Yellow Yellow   Appearance Ur Turbid (A) Clear   Leukocytes, UA 2+ (A) Negative   Protein, UA 1+ (A) Negative/Trace   Glucose, UA Negative Negative   Ketones, UA Negative Negative   RBC, UA 2+ (A) Negative   Bilirubin, UA Negative Negative   Urobilinogen, Ur 0.2 0.2 - 1.0 mg/dL   Nitrite, UA Negative Negative   Microscopic Examination See below:   Lipid Profile  Result Value Ref Range   Cholesterol, Total 139 100 - 199 mg/dL   Triglycerides 119 0 - 149 mg/dL   HDL 51 >39 mg/dL   VLDL Cholesterol Cal 24 5 - 40 mg/dL   LDL Calculated 64 0 - 99 mg/dL   Chol/HDL Ratio 2.7 0.0 - 5.0 ratio  HgB A1c  Result Value Ref Range   Hgb A1c MFr Bld 5.9 (H) 4.8 - 5.6 %   Est. average glucose Bld gHb Est-mCnc 123 mg/dL  Comp Met (CMET)  Result Value Ref Range   Glucose 102 (H) 65 - 99 mg/dL   BUN 15 8 - 27 mg/dL   Creatinine, Ser 1.09 0.76 - 1.27 mg/dL   GFR calc non Af Amer 66 >59 mL/min/1.73   GFR calc Af Amer 76 >59 mL/min/1.73   BUN/Creatinine Ratio 14 10 - 24   Sodium 137  134 - 144 mmol/L   Potassium 3.9 3.5 - 5.2 mmol/L   Chloride 101 96 - 106 mmol/L   CO2 18 (L) 20 - 29 mmol/L   Calcium 9.4 8.6 - 10.2 mg/dL   Total Protein 6.8 6.0 - 8.5 g/dL  Albumin 4.3 3.5 - 4.8 g/dL   Globulin, Total 2.5 1.5 - 4.5 g/dL   Albumin/Globulin Ratio 1.7 1.2 - 2.2   Bilirubin Total 1.4 (H) 0.0 - 1.2 mg/dL   Alkaline Phosphatase 47 39 - 117 IU/L   AST 18 0 - 40 IU/L   ALT 10 0 - 44 IU/L      Assessment & Plan:   Problem List Items Addressed This Visit      Genitourinary   Acute cystitis without hematuria - Primary    Urine noting Nit positive, blood 3+, Leu 3+.  Will treat with Vantin 100 MG BID x 7 days.  Increase fluid intake.  Appears to be recurrent over past few months.  Recommended taking Vitamin C 500 MG BID to acidify urine.      Relevant Orders   UA/M w/rflx Culture, Routine       Follow up plan: Return if symptoms worsen or fail to improve.

## 2018-08-13 NOTE — Assessment & Plan Note (Signed)
Urine noting Nit positive, blood 3+, Leu 3+.  Will treat with Vantin 100 MG BID x 7 days.  Increase fluid intake.  Appears to be recurrent over past few months.  Recommended taking Vitamin C 500 MG BID to acidify urine.

## 2018-08-15 ENCOUNTER — Encounter: Payer: Self-pay | Admitting: Nurse Practitioner

## 2018-08-15 ENCOUNTER — Ambulatory Visit (INDEPENDENT_AMBULATORY_CARE_PROVIDER_SITE_OTHER): Payer: Medicare Other | Admitting: Nurse Practitioner

## 2018-08-15 VITALS — BP 138/85 | HR 102 | Temp 97.9°F | Wt 165.2 lb

## 2018-08-15 DIAGNOSIS — N3000 Acute cystitis without hematuria: Secondary | ICD-10-CM

## 2018-08-15 DIAGNOSIS — R1084 Generalized abdominal pain: Secondary | ICD-10-CM | POA: Diagnosis not present

## 2018-08-15 NOTE — Assessment & Plan Note (Signed)
Continue current medication.  Will draw labs today for pancreatitis testing d/t patient concerns.

## 2018-08-15 NOTE — Patient Instructions (Signed)

## 2018-08-15 NOTE — Progress Notes (Signed)
BP 138/85   Pulse (!) 102   Temp 97.9 F (36.6 C) (Oral)   Wt 165 lb 3.2 oz (74.9 kg)   SpO2 100%   BMI 26.66 kg/m    Subjective:    Patient ID: Rick [name], male    DOB: 04/04/1941, 78 y.o.   MRN: [id number]  HPI: Rick [name] is a 78 y.o. male presents for abdominal pain.  Chief Complaint  Patient presents with  . Abdominal Pain    follow up    [place] today for ongoing abdominal pain which he feels is pancreatitis.  Had at length discussion with him the other day about s/s with pancreatitis, of which he had and continues to have none.  Of note he was placed on Vantin on 08/13/18 for UTI, on review culture is pending.  He reports that the [facility] nurse told him that his Gabapentin and HCTZ can make his "urine bad" and that Metformin can "make my belly bad".  He is very hyper focused on his medications and his belief that he has pancreatitis.   Has h/o multiple stomach surgeries. Duration:weeks Onset: gradual Severity: mild Quality: aching Location:  suprapubic". "lower abdominal quadrants  Episode duration:  Radiation: no Frequency: intermittent Alleviating factors:  Aggravating factors: Status: stable Treatments attempted: none Fever: no Nausea: no Vomiting: no Weight loss: no Decreased appetite: no Diarrhea: no Constipation: no, he reports he has a BM twice a day Blood in stool: no Heartburn: no Jaundice: no Rash: no Dysuria/urinary frequency: yes Hematuria: no History of sexually transmitted disease: no Recurrent NSAID use: no   Relevant past medical, surgical, family and social history reviewed and updated as indicated. Interim medical history since our last visit reviewed. Allergies and medications reviewed and updated.  Review of Systems  Constitutional: Negative for activity change, diaphoresis, fatigue and fever.  Respiratory: Negative for cough, chest tightness, shortness of breath and wheezing.   Cardiovascular:  Negative for chest pain, palpitations and leg swelling.  Gastrointestinal: Negative for abdominal distention, abdominal pain, constipation, diarrhea, nausea and vomiting.  Endocrine: Negative for cold intolerance, heat intolerance, polydipsia, polyphagia and polyuria.  Musculoskeletal: Negative.   Skin: Negative.   Neurological: Negative for dizziness, syncope, weakness, light-headedness, numbness and headaches.  Psychiatric/Behavioral: Negative.     Per HPI unless specifically indicated above     Objective:    BP 138/85   Pulse (!) 102   Temp 97.9 F (36.6 C) (Oral)   Wt 165 lb 3.2 oz (74.9 kg)   SpO2 100%   BMI 26.66 kg/m   Wt Readings from Last 3 Encounters:  08/15/18 165 lb 3.2 oz (74.9 kg)  08/13/18 163 lb 9.6 oz (74.2 kg)  05/28/18 162 lb (73.5 kg)    Physical Exam Vitals signs and nursing note reviewed.  Constitutional:      Appearance: He is well-developed.  HENT:     Head: Normocephalic and atraumatic.     Right Ear: Hearing normal. No drainage.     Left Ear: Hearing normal. No drainage.     Mouth/Throat:     Pharynx: Uvula midline.  Eyes:     General: Lids are normal.        Right eye: No discharge.        Left eye: No discharge.     Conjunctiva/sclera: Conjunctivae normal.     Pupils: Pupils are equal, round, and reactive to light.  Neck:     Musculoskeletal: Normal range of motion and  neck supple.     Thyroid: No thyromegaly.     Vascular: No carotid bruit or JVD.     Trachea: Trachea normal.  Cardiovascular:     Rate and Rhythm: Normal rate and regular rhythm.     Heart sounds: Normal heart sounds, S1 normal and S2 normal. No murmur. No gallop.   Pulmonary:     Effort: Pulmonary effort is normal.     Breath sounds: Normal breath sounds.  Abdominal:     General: Bowel sounds are normal.     Palpations: Abdomen is soft. There is no hepatomegaly or splenomegaly.     Tenderness: There is abdominal tenderness in the suprapubic area. There is no  guarding or rebound. Negative signs include Murphy's sign, Rovsing's sign and McBurney's sign.     Hernia: A hernia is present. Hernia is present in the ventral area.     Comments: Multiple old scars to abdomen.    Genitourinary:    Penis: Normal.      Rectum: Normal.  Musculoskeletal: Normal range of motion.  Skin:    General: Skin is warm and dry.     Capillary Refill: Capillary refill takes less than 2 seconds.     Findings: No rash.  Neurological:     Mental Status: He is alert and oriented to person, place, and time.     Deep Tendon Reflexes: Reflexes are normal and symmetric.  Psychiatric:        Mood and Affect: Mood normal.        Behavior: Behavior normal.        Thought Content: Thought content normal.        Judgment: Judgment normal.     Results for orders placed or performed in visit on 08/13/18  Microscopic Examination  Result Value Ref Range   WBC, UA >30 (H) 0 - 5 /hpf   RBC, UA None seen 0 - 2 /hpf   Epithelial Cells (non renal) 0-10 0 - 10 /hpf   Mucus, UA Present (A) Not Estab.   Bacteria, UA Many (A) None seen/Few  Urine Culture, Reflex  Result Value Ref Range   Urine Culture, Routine WILL FOLLOW   UA/M w/rflx Culture, Routine  Result Value Ref Range   Specific Gravity, UA 1.025 1.005 - 1.030   pH, UA 6.0 5.0 - 7.5   Color, UA Yellow Yellow   Appearance Ur Turbid (A) Clear   Leukocytes, UA 3+ (A) Negative   Protein, UA 2+ (A) Negative/Trace   Glucose, UA Negative Negative   Ketones, UA Trace (A) Negative   RBC, UA 3+ (A) Negative   Bilirubin, UA Negative Negative   Urobilinogen, Ur 1.0 0.2 - 1.0 mg/dL   Nitrite, UA Positive (A) Negative   Microscopic Examination See below:    Urinalysis Reflex Comment       Assessment & Plan:   Problem List Items Addressed This Visit      Genitourinary   Acute cystitis without hematuria    Continue current medication.  Will draw labs today for pancreatitis testing d/t patient concerns.       Other Visit  Diagnoses    Generalized abdominal pain    -  Primary   Labs today to r/o pancreatitis.   Relevant Orders   Comprehensive metabolic panel   CBC with Differential/Platelet   Amylase   Lipase       Follow up plan: Return in about 2 weeks (around 08/29/2018) for Dr. Lenna Sciara or me for  UTI and abdominal pain.

## 2018-08-16 LAB — LIPASE: Lipase: 32 U/L (ref 13–78)

## 2018-08-16 LAB — CBC WITH DIFFERENTIAL/PLATELET
BASOS ABS: 0.1 10*3/uL (ref 0.0–0.2)
BASOS: 1 %
EOS (ABSOLUTE): 0.2 10*3/uL (ref 0.0–0.4)
Eos: 2 %
HEMOGLOBIN: 14 g/dL (ref 13.0–17.7)
Hematocrit: 39.9 % (ref 37.5–51.0)
IMMATURE GRANS (ABS): 0 10*3/uL (ref 0.0–0.1)
IMMATURE GRANULOCYTES: 0 %
LYMPHS: 22 %
Lymphocytes Absolute: 2.3 10*3/uL (ref 0.7–3.1)
MCH: 28.9 pg (ref 26.6–33.0)
MCHC: 35.1 g/dL (ref 31.5–35.7)
MCV: 82 fL (ref 79–97)
MONOCYTES: 8 %
Monocytes Absolute: 0.9 10*3/uL (ref 0.1–0.9)
NEUTROS ABS: 7.2 10*3/uL — AB (ref 1.4–7.0)
Neutrophils: 67 %
Platelets: 324 10*3/uL (ref 150–450)
RBC: 4.84 x10E6/uL (ref 4.14–5.80)
RDW: 14 % (ref 12.3–15.4)
WBC: 10.7 10*3/uL (ref 3.4–10.8)

## 2018-08-16 LAB — COMPREHENSIVE METABOLIC PANEL
ALT: 14 IU/L (ref 0–44)
AST: 17 IU/L (ref 0–40)
Albumin/Globulin Ratio: 1.8 (ref 1.2–2.2)
Albumin: 4.6 g/dL (ref 3.5–4.8)
Alkaline Phosphatase: 50 IU/L (ref 39–117)
BUN/Creatinine Ratio: 13 (ref 10–24)
BUN: 13 mg/dL (ref 8–27)
Bilirubin Total: 0.7 mg/dL (ref 0.0–1.2)
CALCIUM: 9.9 mg/dL (ref 8.6–10.2)
CHLORIDE: 94 mmol/L — AB (ref 96–106)
CO2: 22 mmol/L (ref 20–29)
Creatinine, Ser: 0.98 mg/dL (ref 0.76–1.27)
GFR, EST AFRICAN AMERICAN: 86 mL/min/{1.73_m2} (ref >59)
GFR, EST NON AFRICAN AMERICAN: 74 mL/min/{1.73_m2} (ref >59)
GLUCOSE: 106 mg/dL — AB (ref 65–99)
Globulin, Total: 2.5 g/dL (ref 1.5–4.5)
Potassium: 4.4 mmol/L (ref 3.5–5.2)
Sodium: 133 mmol/L — ABNORMAL LOW (ref 134–144)
TOTAL PROTEIN: 7.1 g/dL (ref 6.0–8.5)

## 2018-08-16 LAB — AMYLASE: AMYLASE: 70 U/L (ref 31–124)

## 2018-08-19 ENCOUNTER — Other Ambulatory Visit: Payer: Self-pay | Admitting: Nurse Practitioner

## 2018-08-19 LAB — UA/M W/RFLX CULTURE, ROUTINE
Bilirubin, UA: NEGATIVE
GLUCOSE, UA: NEGATIVE
NITRITE UA: POSITIVE — AB
SPEC GRAV UA: 1.025 (ref 1.005–1.030)
UUROB: 1 mg/dL (ref 0.2–1.0)
pH, UA: 6 (ref 5.0–7.5)

## 2018-08-19 LAB — URINE CULTURE, REFLEX

## 2018-08-19 LAB — MICROSCOPIC EXAMINATION: RBC MICROSCOPIC, UA: NONE SEEN /HPF (ref 0–2)

## 2018-08-19 MED ORDER — AMOXICILLIN-POT CLAVULANATE 500-125 MG PO TABS: 1.0000 | ORAL_TABLET | Freq: Two times a day (BID) | ORAL | 0 refills | Status: DC

## 2018-08-19 NOTE — Progress Notes (Signed)
Urine culture with sensitivities returned showing resistance to current abx regimen for UTI, will send in 5 days of Augmentin which shows coverage to E. Coli UTI.  Have staff call patient to make aware of change.

## 2018-08-21 ENCOUNTER — Other Ambulatory Visit: Payer: Self-pay | Admitting: Nurse Practitioner

## 2018-08-21 ENCOUNTER — Telehealth: Payer: Self-pay | Admitting: Family Medicine

## 2018-08-21 MED ORDER — AMOXICILLIN-POT CLAVULANATE 500-125 MG PO TABS: 1.0000 | ORAL_TABLET | Freq: Two times a day (BID) | ORAL | 0 refills | Status: AC

## 2018-08-21 NOTE — Progress Notes (Signed)
Lost original Augmentin script and requesting refill.  Refill sent.

## 2018-08-21 NOTE — Telephone Encounter (Signed)
Sent script

## 2018-08-21 NOTE — Telephone Encounter (Signed)
Copied from [place] [phone]. Topic: Quick Communication - Rx Refill/Question >> Aug 21, 2018  8:08 AM Rayann Heman wrote: Medication:amoxicillin-clavulanate (AUGMENTIN) 500-125 MG tablet [[id number]]  pt called and stated that he misplaced medication and needs a new rx. Pt would like a call back. Pt states that he would like a call back regarding. Pt states that this is an emergency.

## 2018-08-21 NOTE — Telephone Encounter (Signed)
Pt found original medication.

## 2018-08-22 DIAGNOSIS — Z79891 Long term (current) use of opiate analgesic: Secondary | ICD-10-CM | POA: Diagnosis not present

## 2018-08-27 ENCOUNTER — Ambulatory Visit (INDEPENDENT_AMBULATORY_CARE_PROVIDER_SITE_OTHER): Payer: Medicare Other | Admitting: Family Medicine

## 2018-08-27 ENCOUNTER — Encounter: Payer: Self-pay | Admitting: Family Medicine

## 2018-08-27 ENCOUNTER — Telehealth: Payer: Self-pay

## 2018-08-27 VITALS — BP 132/72 | HR 100 | Temp 98.2°F | Ht 66.0 in | Wt 166.0 lb

## 2018-08-27 DIAGNOSIS — Z1211 Encounter for screening for malignant neoplasm of colon: Secondary | ICD-10-CM

## 2018-08-27 DIAGNOSIS — N3000 Acute cystitis without hematuria: Secondary | ICD-10-CM | POA: Diagnosis not present

## 2018-08-27 LAB — UA/M W/RFLX CULTURE, ROUTINE
BILIRUBIN UA: NEGATIVE
Glucose, UA: NEGATIVE
Ketones, UA: NEGATIVE
Leukocytes, UA: NEGATIVE
Nitrite, UA: NEGATIVE
Protein, UA: NEGATIVE
RBC, UA: NEGATIVE
Specific Gravity, UA: <1.005 — ABNORMAL LOW (ref 1.005–1.030)
Urobilinogen, Ur: 0.2 mg/dL (ref 0.2–1.0)
pH, UA: 5.5 (ref 5.0–7.5)

## 2018-08-27 NOTE — Progress Notes (Signed)
BP 132/72 (BP Location: Left Arm, Cuff Size: Normal)   Pulse 100   Temp 98.2 F (36.8 C) (Oral)   Ht 5\' 6"  (1.676 m)   Wt 166 lb (75.3 kg)   SpO2 100%   BMI 26.79 kg/m    Subjective:    Patient ID: Rick [name], male    DOB: 08-Apr-1941, 78 y.o.   MRN: [id number]  HPI: Rick [name] is a 78 y.o. male  Chief Complaint  Patient presents with  . Urinary Tract Infection    Seems to be better. Would like it rechecked today.    URINARY SYMPTOMS- feels like he is better. Feels like his symptoms have resolved. Would like his urine rechecked today. No burning. Emptying well. No other concerns about it today. Would like cologuard. He is very concerned about colon cancer. He is aware that he doens  Relevant past medical, surgical, family and social history reviewed and updated as indicated. Interim medical history since our last visit reviewed. Allergies and medications reviewed and updated.  Review of Systems  Constitutional: Negative.   Respiratory: Negative.   Cardiovascular: Negative.   Gastrointestinal: Negative.   Neurological: Positive for tremors. Negative for dizziness, seizures, syncope, facial asymmetry, speech difficulty, weakness, light-headedness, numbness and headaches.  Psychiatric/Behavioral: Negative.     Per HPI unless specifically indicated above     Objective:    BP 132/72 (BP Location: Left Arm, Cuff Size: Normal)   Pulse 100   Temp 98.2 F (36.8 C) (Oral)   Ht 5\' 6"  (1.676 m)   Wt 166 lb (75.3 kg)   SpO2 100%   BMI 26.79 kg/m   Wt Readings from Last 3 Encounters:  08/27/18 166 lb (75.3 kg)  08/15/18 165 lb 3.2 oz (74.9 kg)  08/13/18 163 lb 9.6 oz (74.2 kg)    Physical Exam Vitals signs and nursing note reviewed.  Constitutional:      General: He is not in acute distress.    Appearance: Normal appearance. He is not ill-appearing, toxic-appearing or diaphoretic.     Comments: Disheveled, smelling of urine  HENT:     Head: Normocephalic  and atraumatic.     Right Ear: External ear normal.     Left Ear: External ear normal.     Nose: Nose normal.     Mouth/Throat:     Mouth: Mucous membranes are moist.     Pharynx: Oropharynx is clear.  Eyes:     General: No scleral icterus.       Right eye: No discharge.        Left eye: No discharge.     Extraocular Movements: Extraocular movements intact.     Conjunctiva/sclera: Conjunctivae normal.     Pupils: Pupils are equal, round, and reactive to light.  Neck:     Musculoskeletal: Normal range of motion and neck supple.  Cardiovascular:     Rate and Rhythm: Normal rate and regular rhythm.     Pulses: Normal pulses.     Heart sounds: Normal heart sounds. No murmur. No friction rub. No gallop.   Pulmonary:     Effort: Pulmonary effort is normal. No respiratory distress.     Breath sounds: Normal breath sounds. No stridor. No wheezing, rhonchi or rales.  Chest:     Chest wall: No tenderness.  Musculoskeletal: Normal range of motion.  Skin:    General: Skin is warm and dry.     Capillary Refill: Capillary refill takes less than 2 seconds.  Coloration: Skin is not jaundiced or pale.     Findings: No bruising, erythema, lesion or rash.  Neurological:     General: No focal deficit present.     Mental Status: He is alert and oriented to person, place, and time. Mental status is at baseline.  Psychiatric:        Mood and Affect: Mood normal.        Behavior: Behavior normal.        Thought Content: Thought content normal.        Judgment: Judgment normal.     Results for orders placed or performed in visit on 08/15/18  Comprehensive metabolic panel  Result Value Ref Range   Glucose 106 (H) 65 - 99 mg/dL   BUN 13 8 - 27 mg/dL   Creatinine, Ser 0.98 0.76 - 1.27 mg/dL   GFR calc non Af Amer 74 >59 mL/min/1.73   GFR calc Af Amer 86 >59 mL/min/1.73   BUN/Creatinine Ratio 13 10 - 24   Sodium 133 (L) 134 - 144 mmol/L   Potassium 4.4 3.5 - 5.2 mmol/L   Chloride 94 (L) 96  - 106 mmol/L   CO2 22 20 - 29 mmol/L   Calcium 9.9 8.6 - 10.2 mg/dL   Total Protein 7.1 6.0 - 8.5 g/dL   Albumin 4.6 3.5 - 4.8 g/dL   Globulin, Total 2.5 1.5 - 4.5 g/dL   Albumin/Globulin Ratio 1.8 1.2 - 2.2   Bilirubin Total 0.7 0.0 - 1.2 mg/dL   Alkaline Phosphatase 50 39 - 117 IU/L   AST 17 0 - 40 IU/L   ALT 14 0 - 44 IU/L  CBC with Differential/Platelet  Result Value Ref Range   WBC 10.7 3.4 - 10.8 x10E3/uL   RBC 4.84 4.14 - 5.80 x10E6/uL   Hemoglobin 14.0 13.0 - 17.7 g/dL   Hematocrit 39.9 37.5 - 51.0 %   MCV 82 79 - 97 fL   MCH 28.9 26.6 - 33.0 pg   MCHC 35.1 31.5 - 35.7 g/dL   RDW 14.0 12.3 - 15.4 %   Platelets 324 150 - 450 x10E3/uL   Neutrophils 67 Not Estab. %   Lymphs 22 Not Estab. %   Monocytes 8 Not Estab. %   Eos 2 Not Estab. %   Basos 1 Not Estab. %   Neutrophils Absolute 7.2 (H) 1.4 - 7.0 x10E3/uL   Lymphocytes Absolute 2.3 0.7 - 3.1 x10E3/uL   Monocytes Absolute 0.9 0.1 - 0.9 x10E3/uL   EOS (ABSOLUTE) 0.2 0.0 - 0.4 x10E3/uL   Basophils Absolute 0.1 0.0 - 0.2 x10E3/uL   Immature Granulocytes 0 Not Estab. %   Immature Grans (Abs) 0.0 0.0 - 0.1 x10E3/uL  Amylase  Result Value Ref Range   Amylase 70 31 - 124 U/L  Lipase  Result Value Ref Range   Lipase 32 13 - 78 U/L      Assessment & Plan:   Problem List Items Addressed This Visit      Genitourinary   Acute cystitis without hematuria - Primary   Relevant Orders   UA/M w/rflx Culture, Routine    Other Visit Diagnoses    Screening for colon cancer       Would like to do cologuard- order placed today.   Relevant Orders   Cologuard       Follow up plan: Return As scheduled.

## 2018-08-27 NOTE — Telephone Encounter (Signed)
-----   Message from [name], DO sent at 08/27/2018 10:01 AM EST ----- Cologuard please

## 2018-08-27 NOTE — Telephone Encounter (Signed)
Cologuard ordered via online portal.

## 2018-08-28 ENCOUNTER — Ambulatory Visit: Payer: Medicare Other | Admitting: Family Medicine

## 2018-09-02 ENCOUNTER — Encounter: Payer: Self-pay | Admitting: Family Medicine

## 2018-09-02 ENCOUNTER — Ambulatory Visit (INDEPENDENT_AMBULATORY_CARE_PROVIDER_SITE_OTHER): Payer: Medicare Other | Admitting: Family Medicine

## 2018-09-02 VITALS — BP 149/82 | HR 62 | Temp 97.6°F | Ht 66.0 in | Wt 163.0 lb

## 2018-09-02 DIAGNOSIS — N411 Chronic prostatitis: Secondary | ICD-10-CM | POA: Diagnosis not present

## 2018-09-02 DIAGNOSIS — N390 Urinary tract infection, site not specified: Secondary | ICD-10-CM | POA: Diagnosis not present

## 2018-09-02 DIAGNOSIS — R300 Dysuria: Secondary | ICD-10-CM | POA: Diagnosis not present

## 2018-09-02 MED ORDER — AMOXICILLIN-POT CLAVULANATE 875-125 MG PO TABS: 1.0000 | ORAL_TABLET | Freq: Two times a day (BID) | ORAL | 0 refills | Status: DC

## 2018-09-02 NOTE — Patient Instructions (Signed)
AZO as needed for burning

## 2018-09-02 NOTE — Progress Notes (Signed)
BP (!) 149/82 (BP Location: Right Arm, Patient Position: Sitting, Cuff Size: Normal)   Pulse 62   Temp 97.6 F (36.4 C)   Ht 5\' 6"  (1.676 m)   Wt 163 lb (73.9 kg)   SpO2 99%   BMI 26.31 kg/m    Subjective:    Patient ID: Rick [name], male    DOB: 12/03/1940, 78 y.o.   MRN: [id number]  HPI: Rick [name] is a 78 y.o. male  Chief Complaint  Patient presents with  . Urinary Tract Infection   Here today with several weeks of dysuria and increased urinary frequency. Was treated almost a month ago for an e coli UTI with good resolution temporarily. Recent U/A WNL last week. Hx of recurrent UTIs and chronic prostatitis, followed by Urology but has not been in months. Denies fevers, chills, abdominal pain, N/V/D. Not trying anything for sxs.   Relevant past medical, surgical, family and social history reviewed and updated as indicated. Interim medical history since our last visit reviewed. Allergies and medications reviewed and updated.  Review of Systems  Per HPI unless specifically indicated above     Objective:    BP (!) 149/82 (BP Location: Right Arm, Patient Position: Sitting, Cuff Size: Normal)   Pulse 62   Temp 97.6 F (36.4 C)   Ht 5\' 6"  (1.676 m)   Wt 163 lb (73.9 kg)   SpO2 99%   BMI 26.31 kg/m   Wt Readings from Last 3 Encounters:  09/02/18 163 lb (73.9 kg)  08/27/18 166 lb (75.3 kg)  08/15/18 165 lb 3.2 oz (74.9 kg)    Physical Exam Vitals signs and nursing note reviewed.  Constitutional:      Comments: Appears disheveled, poor hygiene  HENT:     Head: Atraumatic.  Eyes:     Extraocular Movements: Extraocular movements intact.     Conjunctiva/sclera: Conjunctivae normal.  Neck:     Musculoskeletal: Normal range of motion and neck supple.  Cardiovascular:     Rate and Rhythm: Normal rate and regular rhythm.  Pulmonary:     Effort: Pulmonary effort is normal.     Breath sounds: Normal breath sounds.  Abdominal:     General: Bowel sounds are  normal.     Palpations: Abdomen is soft.     Tenderness: There is no abdominal tenderness. There is no right CVA tenderness, left CVA tenderness or guarding.  Musculoskeletal: Normal range of motion.  Skin:    General: Skin is warm and dry.  Neurological:     General: No focal deficit present.     Mental Status: He is alert and oriented to person, place, and time.  Psychiatric:        Mood and Affect: Mood normal.        Thought Content: Thought content normal.        Judgment: Judgment normal.     Results for orders placed or performed in visit on 08/27/18  UA/M w/rflx Culture, Routine  Result Value Ref Range   Specific Gravity, UA <1.005 (L) 1.005 - 1.030   pH, UA 5.5 5.0 - 7.5   Color, UA Yellow Yellow   Appearance Ur Clear Clear   Leukocytes, UA Negative Negative   Protein, UA Negative Negative/Trace   Glucose, UA Negative Negative   Ketones, UA Negative Negative   RBC, UA Negative Negative   Bilirubin, UA Negative Negative   Urobilinogen, Ur 0.2 0.2 - 1.0 mg/dL   Nitrite, UA Negative Negative  Assessment & Plan:   Problem List Items Addressed This Visit      Genitourinary   Chronic prostatitis   Relevant Orders   Ambulatory referral to Urology   Recurrent UTI   Relevant Orders   Ambulatory referral to Urology    Other Visit Diagnoses    Acute lower UTI    -  Primary   Tx with augmentin and await urine culture. Discussed increasing water intake, probiotics, and close Urology f/u.    Relevant Orders   UA/M w/rflx Culture, Routine    Per record review, unable to return to [facility] and states he cannot drive to his Urologist in [place]. Requesting referral to be placed to a local Urologist. Declines transportation assistance to help get him to his appts out of town.    Follow up plan: Return if symptoms worsen or fail to improve.

## 2018-09-05 LAB — UA/M W/RFLX CULTURE, ROUTINE
Bilirubin, UA: NEGATIVE
GLUCOSE, UA: NEGATIVE
KETONES UA: NEGATIVE
Nitrite, UA: NEGATIVE
Protein, UA: NEGATIVE
SPEC GRAV UA: 1.005 (ref 1.005–1.030)
Urobilinogen, Ur: 0.2 mg/dL (ref 0.2–1.0)
pH, UA: 5.5 (ref 5.0–7.5)

## 2018-09-05 LAB — MICROSCOPIC EXAMINATION

## 2018-09-05 LAB — URINE CULTURE, REFLEX

## 2018-09-16 DIAGNOSIS — R251 Tremor, unspecified: Secondary | ICD-10-CM | POA: Diagnosis not present

## 2018-10-08 ENCOUNTER — Ambulatory Visit: Payer: Medicare Other | Admitting: Urology

## 2018-10-08 ENCOUNTER — Telehealth: Payer: Self-pay | Admitting: Family Medicine

## 2018-10-08 NOTE — Telephone Encounter (Unsigned)
Copied from [place] [id number]. Topic: Medicare AWV >> Oct 08, 2018 12:58 PM Sherren Kerns wrote: Called to schedule Medicare Annual Wellness Visit with the Nurse Health Advisor. No answer at home number and no machine to leave a message  If patient returns call, please note: their last AWV was on 12/07/2016, please schedule AWV-s  with [facility] any date AFTER  12/07/2017.  Thank you! For any questions please contact: Janace Hoard at [phone] or Skype [email or web address]

## 2018-10-10 DIAGNOSIS — G4700 Insomnia, unspecified: Secondary | ICD-10-CM | POA: Diagnosis not present

## 2018-10-18 ENCOUNTER — Other Ambulatory Visit: Payer: Self-pay | Admitting: Urology

## 2018-10-18 ENCOUNTER — Ambulatory Visit: Admission: RE | Admit: 2018-10-18 | Payer: Medicare Other | Source: Ambulatory Visit

## 2018-10-18 DIAGNOSIS — D2930 Benign neoplasm of unspecified epididymis: Secondary | ICD-10-CM

## 2018-10-21 ENCOUNTER — Other Ambulatory Visit: Payer: Self-pay

## 2018-10-21 ENCOUNTER — Ambulatory Visit
Admission: RE | Admit: 2018-10-21 | Discharge: 2018-10-21 | Disposition: A | Discharge status: Home / Self Care | Payer: Medicare Other | Source: Ambulatory Visit | Attending: Urology | Admitting: Urology

## 2018-10-21 DIAGNOSIS — D2930 Benign neoplasm of unspecified epididymis: Secondary | ICD-10-CM | POA: Diagnosis present

## 2018-10-24 ENCOUNTER — Ambulatory Visit (INDEPENDENT_AMBULATORY_CARE_PROVIDER_SITE_OTHER): Payer: Medicare Other | Admitting: Family Medicine

## 2018-10-24 ENCOUNTER — Encounter: Payer: Self-pay | Admitting: Family Medicine

## 2018-10-24 DIAGNOSIS — N3000 Acute cystitis without hematuria: Secondary | ICD-10-CM

## 2018-10-24 DIAGNOSIS — H9193 Unspecified hearing loss, bilateral: Secondary | ICD-10-CM

## 2018-10-24 DIAGNOSIS — I10 Essential (primary) hypertension: Secondary | ICD-10-CM | POA: Diagnosis not present

## 2018-10-24 LAB — URINALYSIS, ROUTINE W REFLEX MICROSCOPIC
Bilirubin, UA: NEGATIVE
Glucose, UA: NEGATIVE
Nitrite, UA: POSITIVE — AB
Protein, UA: NEGATIVE
Specific Gravity, UA: 1.015 (ref 1.005–1.030)
Urobilinogen, Ur: 0.2 mg/dL (ref 0.2–1.0)
pH, UA: 5.5 (ref 5.0–7.5)

## 2018-10-24 LAB — MICROSCOPIC EXAMINATION: WBC, UA: >30 /hpf — AB (ref 0–5)

## 2018-10-24 MED ORDER — DOXYCYCLINE HYCLATE 100 MG PO TABS: 100.0000 mg | ORAL_TABLET | Freq: Two times a day (BID) | ORAL | 0 refills | Status: DC

## 2018-10-24 NOTE — Assessment & Plan Note (Signed)
Discussed voice modulation

## 2018-10-24 NOTE — Assessment & Plan Note (Signed)
The current medical regimen is effective;  continue present plan and medications.  

## 2018-10-24 NOTE — Progress Notes (Signed)
BP 130/76   Pulse 73   Temp 97.9 F (36.6 C)   Ht 5\' 6"  (1.676 m)   Wt 165 lb 1 oz (74.9 kg)   SpO2 99%   BMI 26.64 kg/m    Subjective:    Patient ID: Rick [name], male    DOB: 30-Dec-1940, 78 y.o.   MRN: [id number]  HPI: Rick [name] is a 78 y.o. male  Chief Complaint  Patient presents with  . Urinary Tract Infection   All patient was in the office the chart was unavailable due to technical issues with epic systemwide. Patient complaining of recurrent UTI frequency urgency dysuria. Also complaining of hydrocele which becomes uncomfortable and wants something done. Is uncomfortable going to [facility] for urology appointments and wants to avoid appointments with Dr. Bernardo Heater because [facility] is where the patient has been directed for care with [facility]. Patient is partially deaf with hearing difficulties requiring speaking loudly so the patient cannot hear.  Relevant past medical, surgical, family and social history reviewed and updated as indicated. Interim medical history since our last visit reviewed. Allergies and medications reviewed and updated.  Review of Systems  Constitutional: Negative.   Respiratory: Negative.   Cardiovascular: Negative.     Per HPI unless specifically indicated above     Objective:    BP 130/76   Pulse 73   Temp 97.9 F (36.6 C)   Ht 5\' 6"  (1.676 m)   Wt 165 lb 1 oz (74.9 kg)   SpO2 99%   BMI 26.64 kg/m   Wt Readings from Last 3 Encounters:  10/24/18 165 lb 1 oz (74.9 kg)  09/02/18 163 lb (73.9 kg)  08/27/18 166 lb (75.3 kg)    Physical Exam Constitutional:      Appearance: He is well-developed.  HENT:     Head: Normocephalic and atraumatic.  Eyes:     Conjunctiva/sclera: Conjunctivae normal.  Neck:     Musculoskeletal: Normal range of motion.  Cardiovascular:     Rate and Rhythm: Normal rate and regular rhythm.     Heart sounds: Normal heart sounds.  Pulmonary:     Effort: Pulmonary effort is normal.   Breath sounds: Normal breath sounds.  Musculoskeletal: Normal range of motion.  Skin:    Findings: No erythema.  Neurological:     Mental Status: He is alert and oriented to person, place, and time.  Psychiatric:        Behavior: Behavior normal.        Thought Content: Thought content normal.        Judgment: Judgment normal.     Results for orders placed or performed in visit on 09/02/18  Microscopic Examination  Result Value Ref Range   WBC, UA 6-10 (A) 0 - 5 /hpf   RBC, UA 3-10 (A) 0 - 2 /hpf   Epithelial Cells (non renal) 0-10 0 - 10 /hpf   Renal Epithel, UA 0-10 (A) None seen /hpf   Bacteria, UA Moderate (A) None seen/Few  Urine Culture, Reflex  Result Value Ref Range   Urine Culture, Routine Final report (A)    Organism ID, Bacteria Escherichia coli (A)    Antimicrobial Susceptibility Comment   UA/M w/rflx Culture, Routine  Result Value Ref Range   Specific Gravity, UA 1.005 1.005 - 1.030   pH, UA 5.5 5.0 - 7.5   Color, UA Yellow Yellow   Appearance Ur Clear Clear   Leukocytes, UA 3+ (A) Negative   Protein, UA  Negative Negative/Trace   Glucose, UA Negative Negative   Ketones, UA Negative Negative   RBC, UA Trace (A) Negative   Bilirubin, UA Negative Negative   Urobilinogen, Ur 0.2 0.2 - 1.0 mg/dL   Nitrite, UA Negative Negative   Microscopic Examination See below:    Urinalysis Reflex Comment       Assessment & Plan:   Problem List Items Addressed This Visit      Cardiovascular and Mediastinum   Hypertension    The current medical regimen is effective;  continue present plan and medications.         Nervous and Auditory   Hard of hearing    Discussed voice modulation        Genitourinary   Acute cystitis without hematuria    Apparent urinary tract infection will treat with doxycycline for now will do culture and sensitivity and then because fungal elements were seen we will also do a fungal culture.  This takes 3 to 5 weeks to get the return.       Relevant Orders   Urinalysis, Routine w reflex microscopic   Urine Culture   Fungus Culture W/Rfx Rapid ID      Patient with a confusing history for prostate patient has an appointment coming up with Dr. Gloriann Loan urology who he will keep the appointment with does not want to see Dr. Bernardo Heater which I okayed patient already has appointment with his urologist. Follow up plan: Return if symptoms worsen or fail to improve, for As scheduled.

## 2018-10-24 NOTE — Assessment & Plan Note (Signed)
Apparent urinary tract infection will treat with doxycycline for now will do culture and sensitivity and then because fungal elements were seen we will also do a fungal culture.  This takes 3 to 5 weeks to get the return.

## 2018-10-28 ENCOUNTER — Telehealth: Payer: Self-pay | Admitting: Family Medicine

## 2018-10-28 LAB — URINE CULTURE

## 2018-10-28 MED ORDER — AMOXICILLIN-POT CLAVULANATE 875-125 MG PO TABS: 1.0000 | ORAL_TABLET | Freq: Two times a day (BID) | ORAL | 0 refills | Status: DC

## 2018-10-28 NOTE — Telephone Encounter (Signed)
Please let him know that his urine culture is resistant to his antibiotic, so I've sent through a new Rx to his pharmacy.

## 2018-10-28 NOTE — Telephone Encounter (Signed)
Message relayed to patient. Verbalized understanding and denied questions.   

## 2018-11-12 ENCOUNTER — Telehealth: Payer: Self-pay | Admitting: Family Medicine

## 2018-11-12 NOTE — Telephone Encounter (Signed)
Copied from [place] [id number]. Topic: Quick Communication - Lab Results (Clinic Use ONLY) >> Nov 12, 2018  1:47 PM Scherrie Gerlach wrote: Maudie Mercury from [facility] called to give call report on pt.  Pt has a positive fungus culture.  (BF culture) She states ordered by Dr Jeananne Rama. Maudie Mercury going to fax report to the office.

## 2018-11-12 NOTE — Telephone Encounter (Signed)
Will await results

## 2018-11-14 NOTE — Telephone Encounter (Signed)
Results are the same that are in the computer. Await fungal culture.

## 2018-11-14 NOTE — Telephone Encounter (Signed)
Can someone check Dr. Rance Muir box to see if this has been faxed over?

## 2018-11-14 NOTE — Telephone Encounter (Signed)
Results printed from [place] site. Spoke w/ Dr. Wynetta Emery via Skype who requested that I give them to Mrs. Orene Desanctis, PA-C since she was in office to review.

## 2018-11-18 MED ORDER — FLUCONAZOLE 100 MG PO TABS: 100.0000 mg | ORAL_TABLET | Freq: Every day | ORAL | 0 refills | Status: DC

## 2018-11-18 NOTE — Telephone Encounter (Signed)
Please let him know that he also had some yeast in his bladder so I've sent through some medicine to help with that. Thanks.

## 2018-11-18 NOTE — Telephone Encounter (Signed)
Patient notified

## 2018-11-21 ENCOUNTER — Ambulatory Visit: Payer: Medicare Other | Admitting: Family Medicine

## 2018-11-21 ENCOUNTER — Telehealth: Payer: Self-pay | Admitting: Family Medicine

## 2018-11-21 DIAGNOSIS — R300 Dysuria: Secondary | ICD-10-CM

## 2018-11-21 LAB — FUNGUS CULTURE W/RFX RAPID ID: Fungal Culture W/Rfx: POSITIVE — AB

## 2018-11-21 LAB — FUNGAL ID BY MOLECULAR METHODS

## 2018-11-21 NOTE — Telephone Encounter (Signed)
Copied from [place] [id number]. Topic: General - Other >> Nov 21, 2018  3:05 PM Nils Flack wrote: Reason for CRM: please call pt about scheduling  an appt for poss bladder infection. It sounds like he wants to drop off urine,but I am unsure. Was advised to send crm  He states he will call back if he does not get a call in the next 40 mins.  Please call [phone]

## 2018-11-21 NOTE — Telephone Encounter (Signed)
OK for him to drop off a urine and schedule telephone visit. Please also have him call his urologist, Dr. Gloriann Loan as he needs a follow up with him. Thanks.

## 2018-11-21 NOTE — Telephone Encounter (Signed)
Pt called back advised to bring urine sample in am set up for telephone visit in pm. Pt states that he reached out to Dr. Purvis Sheffield office and they aren't able to see him at this time.

## 2018-11-21 NOTE — Telephone Encounter (Signed)
Order for urine is in. Thanks!

## 2018-11-22 ENCOUNTER — Other Ambulatory Visit: Payer: Medicare Other

## 2018-11-22 ENCOUNTER — Encounter: Payer: Self-pay | Admitting: Family Medicine

## 2018-11-22 ENCOUNTER — Ambulatory Visit (INDEPENDENT_AMBULATORY_CARE_PROVIDER_SITE_OTHER): Payer: Medicare Other | Admitting: Family Medicine

## 2018-11-22 ENCOUNTER — Other Ambulatory Visit: Payer: Self-pay

## 2018-11-22 DIAGNOSIS — N390 Urinary tract infection, site not specified: Secondary | ICD-10-CM | POA: Diagnosis not present

## 2018-11-22 DIAGNOSIS — R300 Dysuria: Secondary | ICD-10-CM | POA: Diagnosis not present

## 2018-11-22 MED ORDER — AMOXICILLIN 875 MG PO TABS: 875.0000 mg | ORAL_TABLET | Freq: Two times a day (BID) | ORAL | 0 refills | Status: DC

## 2018-11-22 NOTE — Progress Notes (Signed)
There were no vitals taken for this visit.   Subjective:    Patient ID: Rick [name], male    DOB: 1941/05/11, 78 y.o.   MRN: [id number]  HPI: Rick [name] is a 78 y.o. male  Chief Complaint  Patient presents with  . Urinary Tract Infection    pt states he thinks he has another UTI, states he had a kidney problem in the past   URINARY SYMPTOMS Duration: about a week Dysuria: yes Urinary frequency: yes Urgency: yes Small volume voids: yes Symptom severity: moderate Urinary incontinence: no Foul odor: yes Hematuria: yes Abdominal pain: yes Back pain: no Suprapubic pain/pressure: no Flank pain: no Fever:  no Vomiting: no Relief with cranberry juice: no Relief with pyridium: no Status: better/worse/stable Previous urinary tract infection: yes Recurrent urinary tract infection: yes History of sexually transmitted disease: no Penile discharge: no Treatments attempted: antibiotics and increasing fluids    Relevant past medical, surgical, family and social history reviewed and updated as indicated. Interim medical history since our last visit reviewed. Allergies and medications reviewed and updated.  Review of Systems  Constitutional: Negative.   Respiratory: Negative.   Cardiovascular: Negative.   Genitourinary: Positive for dysuria, frequency and urgency. Negative for decreased urine volume, difficulty urinating, discharge, enuresis, flank pain, genital sores, hematuria, penile pain, penile swelling, scrotal swelling and testicular pain.  Musculoskeletal: Negative.   Psychiatric/Behavioral: Negative.     Per HPI unless specifically indicated above     Objective:    There were no vitals taken for this visit.  Wt Readings from Last 3 Encounters:  10/24/18 165 lb 1 oz (74.9 kg)  09/02/18 163 lb (73.9 kg)  08/27/18 166 lb (75.3 kg)    Physical Exam Vitals signs and nursing note reviewed.  Pulmonary:     Effort: Pulmonary effort is normal. No  respiratory distress.     Comments: Speaking in full sentences Neurological:     Mental Status: He is alert.  Psychiatric:        Mood and Affect: Mood normal.        Speech: Speech is rapid and pressured.        Behavior: Behavior normal.        Thought Content: Thought content normal.        Judgment: Judgment normal.     Results for orders placed or performed in visit on 11/22/18  Microscopic Examination  Result Value Ref Range   WBC, UA >30 (A) 0 - 5 /hpf   RBC 0-2 0 - 2 /hpf   Epithelial Cells (non renal) 0-10 0 - 10 /hpf   Mucus, UA Present Not Estab.   Bacteria, UA Moderate (A) None seen/Few  Urine Culture, Reflex  Result Value Ref Range   Urine Culture, Routine WILL FOLLOW   UA/M w/rflx Culture, Routine  Result Value Ref Range   Specific Gravity, UA 1.015 1.005 - 1.030   pH, UA 5.5 5.0 - 7.5   Color, UA Yellow Yellow   Appearance Ur Cloudy (A) Clear   Leukocytes,UA 2+ (A) Negative   Protein,UA 1+ (A) Negative/Trace   Glucose, UA Negative Negative   Ketones, UA Negative Negative   RBC, UA 2+ (A) Negative   Bilirubin, UA Negative Negative   Urobilinogen, Ur 0.2 0.2 - 1.0 mg/dL   Nitrite, UA Negative Negative   Microscopic Examination See below:    Urinalysis Reflex Comment       Assessment & Plan:   Problem List Items Addressed  This Visit      Genitourinary   Recurrent UTI - Primary    Has another UTI today- will treat with amoxicillin as that usually works. Would like to see a local urologist without driving to [place]. Referral to [facility] made today. Overdue for follow up on his sugars- will get him back in shortly.       Relevant Orders   Ambulatory referral to Urology       Follow up plan: Return 2-3 weeks, for DM follow up.   . This visit was completed via telephone due to the restrictions of the COVID-19 pandemic. All issues as above were discussed and addressed but no physical exam was performed. If it was felt that the patient should be evaluated in  the office, they were directed there. The patient verbally consented to this visit. Patient was unable to complete an audio/visual visit due to Lack of equipment. Due to the catastrophic nature of the COVID-19 pandemic, this visit was done through audio contact only. . Location of the patient: home . Location of the provider: home . Those involved with this call:  . Provider: Park Liter, DO . CMA: Yvonna Alanis, [place] . Front Desk/Registration: Don Perking  . Time spent on call: 15 minutes on the phone discussing health concerns. 23 minutes total spent in review of patient's record and preparation of their chart.

## 2018-11-22 NOTE — Assessment & Plan Note (Signed)
Has another UTI today- will treat with amoxicillin as that usually works. Would like to see a local urologist without driving to [place]. Referral to [facility] made today. Overdue for follow up on his sugars- will get him back in shortly.

## 2018-11-24 LAB — MICROSCOPIC EXAMINATION: WBC, UA: >30 /hpf — AB (ref 0–5)

## 2018-11-24 LAB — UA/M W/RFLX CULTURE, ROUTINE
Bilirubin, UA: NEGATIVE
Glucose, UA: NEGATIVE
Ketones, UA: NEGATIVE
Nitrite, UA: NEGATIVE
Specific Gravity, UA: 1.015 (ref 1.005–1.030)
Urobilinogen, Ur: 0.2 mg/dL (ref 0.2–1.0)
pH, UA: 5.5 (ref 5.0–7.5)

## 2018-11-24 LAB — URINE CULTURE, REFLEX: Organism ID, Bacteria: NO GROWTH

## 2018-11-28 ENCOUNTER — Telehealth: Payer: Self-pay | Admitting: Urology

## 2018-11-28 ENCOUNTER — Telehealth: Payer: Self-pay | Admitting: Family Medicine

## 2018-11-28 DIAGNOSIS — F3170 Bipolar disorder, currently in remission, most recent episode unspecified: Secondary | ICD-10-CM

## 2018-11-28 DIAGNOSIS — E1142 Type 2 diabetes mellitus with diabetic polyneuropathy: Secondary | ICD-10-CM

## 2018-11-28 DIAGNOSIS — R3914 Feeling of incomplete bladder emptying: Secondary | ICD-10-CM

## 2018-11-28 DIAGNOSIS — E782 Mixed hyperlipidemia: Secondary | ICD-10-CM

## 2018-11-28 DIAGNOSIS — N390 Urinary tract infection, site not specified: Secondary | ICD-10-CM

## 2018-11-28 DIAGNOSIS — I10 Essential (primary) hypertension: Secondary | ICD-10-CM

## 2018-11-28 DIAGNOSIS — N401 Enlarged prostate with lower urinary tract symptoms: Secondary | ICD-10-CM

## 2018-11-28 NOTE — Telephone Encounter (Signed)
Nevermind, they just called back and said leave it where it is.

## 2018-11-28 NOTE — Addendum Note (Signed)
Addended by: [name] on: 11/28/2018 11:13 AM   Modules accepted: Orders

## 2018-11-28 NOTE — Telephone Encounter (Signed)
[facility] called office and wanted to know why this pt's appt is not until May when it was sent as an urgent referral for recurrent UTI's.  Please advise.

## 2018-11-28 NOTE — Telephone Encounter (Signed)
Called to schedule his follow up appointment but patient did not want to schedule f/u appointment until he either talks with Dr Wynetta Emery or [place] regarding his lab results.  Thank you

## 2018-11-28 NOTE — Telephone Encounter (Signed)
Rick [name] scheduled appt

## 2018-12-10 ENCOUNTER — Other Ambulatory Visit: Payer: Self-pay | Admitting: Family Medicine

## 2018-12-11 ENCOUNTER — Other Ambulatory Visit: Payer: Self-pay

## 2018-12-11 ENCOUNTER — Other Ambulatory Visit: Payer: Medicare Other

## 2018-12-11 DIAGNOSIS — E782 Mixed hyperlipidemia: Secondary | ICD-10-CM

## 2018-12-11 DIAGNOSIS — R3914 Feeling of incomplete bladder emptying: Secondary | ICD-10-CM | POA: Diagnosis not present

## 2018-12-11 DIAGNOSIS — N390 Urinary tract infection, site not specified: Secondary | ICD-10-CM | POA: Diagnosis not present

## 2018-12-11 DIAGNOSIS — N401 Enlarged prostate with lower urinary tract symptoms: Secondary | ICD-10-CM

## 2018-12-11 DIAGNOSIS — E1142 Type 2 diabetes mellitus with diabetic polyneuropathy: Secondary | ICD-10-CM

## 2018-12-11 DIAGNOSIS — F3170 Bipolar disorder, currently in remission, most recent episode unspecified: Secondary | ICD-10-CM

## 2018-12-11 DIAGNOSIS — I10 Essential (primary) hypertension: Secondary | ICD-10-CM | POA: Diagnosis not present

## 2018-12-12 ENCOUNTER — Telehealth: Payer: Self-pay | Admitting: Family Medicine

## 2018-12-12 LAB — COMPREHENSIVE METABOLIC PANEL
ALT: 9 IU/L (ref 0–44)
AST: 25 IU/L (ref 0–40)
Albumin/Globulin Ratio: 1.5 (ref 1.2–2.2)
Albumin: 4.8 g/dL — ABNORMAL HIGH (ref 3.7–4.7)
Alkaline Phosphatase: 62 IU/L (ref 39–117)
BUN/Creatinine Ratio: 18 (ref 10–24)
BUN: 18 mg/dL (ref 8–27)
Bilirubin Total: 0.6 mg/dL (ref 0.0–1.2)
CO2: 20 mmol/L (ref 20–29)
Calcium: 10.4 mg/dL — ABNORMAL HIGH (ref 8.6–10.2)
Chloride: 101 mmol/L (ref 96–106)
Creatinine, Ser: 1 mg/dL (ref 0.76–1.27)
GFR calc Af Amer: 84 mL/min/{1.73_m2} (ref >59)
GFR calc non Af Amer: 72 mL/min/{1.73_m2} (ref >59)
Globulin, Total: 3.2 g/dL (ref 1.5–4.5)
Glucose: 88 mg/dL (ref 65–99)
Potassium: 4.4 mmol/L (ref 3.5–5.2)
Sodium: 140 mmol/L (ref 134–144)
Total Protein: 8 g/dL (ref 6.0–8.5)

## 2018-12-12 LAB — CBC WITH DIFFERENTIAL/PLATELET
Basophils Absolute: 0.1 10*3/uL (ref 0.0–0.2)
Basos: 1 %
EOS (ABSOLUTE): 0.3 10*3/uL (ref 0.0–0.4)
Eos: 4 %
Hematocrit: 43.6 % (ref 37.5–51.0)
Hemoglobin: 14.7 g/dL (ref 13.0–17.7)
Immature Grans (Abs): 0 10*3/uL (ref 0.0–0.1)
Immature Granulocytes: 0 %
Lymphocytes Absolute: 2 10*3/uL (ref 0.7–3.1)
Lymphs: 26 %
MCH: 28.5 pg (ref 26.6–33.0)
MCHC: 33.7 g/dL (ref 31.5–35.7)
MCV: 85 fL (ref 79–97)
Monocytes Absolute: 0.6 10*3/uL (ref 0.1–0.9)
Monocytes: 8 %
Neutrophils Absolute: 4.7 10*3/uL (ref 1.4–7.0)
Neutrophils: 61 %
Platelets: 189 10*3/uL (ref 150–450)
RBC: 5.16 x10E6/uL (ref 4.14–5.80)
RDW: 13.2 % (ref 11.6–15.4)
WBC: 7.6 10*3/uL (ref 3.4–10.8)

## 2018-12-12 LAB — PSA: Prostate Specific Ag, Serum: 0.9 ng/mL (ref 0.0–4.0)

## 2018-12-12 LAB — LIPID PANEL W/O CHOL/HDL RATIO
Cholesterol, Total: 193 mg/dL (ref 100–199)
HDL: 49 mg/dL (ref >39)
LDL Calculated: 113 mg/dL — ABNORMAL HIGH (ref 0–99)
Triglycerides: 154 mg/dL — ABNORMAL HIGH (ref 0–149)
VLDL Cholesterol Cal: 31 mg/dL (ref 5–40)

## 2018-12-12 LAB — TSH: TSH: 1.54 u[IU]/mL (ref 0.450–4.500)

## 2018-12-12 NOTE — Telephone Encounter (Signed)
Called pt no answer no voicemail

## 2018-12-13 ENCOUNTER — Telehealth: Payer: Self-pay | Admitting: Family Medicine

## 2018-12-13 ENCOUNTER — Ambulatory Visit (INDEPENDENT_AMBULATORY_CARE_PROVIDER_SITE_OTHER): Payer: Medicare Other | Admitting: Family Medicine

## 2018-12-13 ENCOUNTER — Other Ambulatory Visit: Payer: Self-pay

## 2018-12-13 ENCOUNTER — Encounter: Payer: Self-pay | Admitting: Family Medicine

## 2018-12-13 VITALS — BP 128/78 | HR 72 | Ht 66.0 in

## 2018-12-13 DIAGNOSIS — N390 Urinary tract infection, site not specified: Secondary | ICD-10-CM | POA: Diagnosis not present

## 2018-12-13 DIAGNOSIS — F3171 Bipolar disorder, in partial remission, most recent episode hypomanic: Secondary | ICD-10-CM

## 2018-12-13 DIAGNOSIS — E785 Hyperlipidemia, unspecified: Secondary | ICD-10-CM | POA: Diagnosis not present

## 2018-12-13 DIAGNOSIS — I10 Essential (primary) hypertension: Secondary | ICD-10-CM

## 2018-12-13 DIAGNOSIS — E782 Mixed hyperlipidemia: Secondary | ICD-10-CM

## 2018-12-13 DIAGNOSIS — N442 Benign cyst of testis: Secondary | ICD-10-CM | POA: Diagnosis not present

## 2018-12-13 DIAGNOSIS — E1142 Type 2 diabetes mellitus with diabetic polyneuropathy: Secondary | ICD-10-CM

## 2018-12-13 DIAGNOSIS — Z748 Other problems related to care provider dependency: Secondary | ICD-10-CM

## 2018-12-13 LAB — UA/M W/RFLX CULTURE, ROUTINE
Bilirubin, UA: NEGATIVE
Glucose, UA: NEGATIVE
Ketones, UA: NEGATIVE
Nitrite, UA: NEGATIVE
Protein,UA: NEGATIVE
RBC, UA: NEGATIVE
Specific Gravity, UA: 1.015 (ref 1.005–1.030)
Urobilinogen, Ur: 0.2 mg/dL (ref 0.2–1.0)
pH, UA: 5.5 (ref 5.0–7.5)

## 2018-12-13 LAB — MICROSCOPIC EXAMINATION
RBC, Urine: NONE SEEN /hpf (ref 0–2)
WBC, UA: >30 /hpf — AB (ref 0–5)

## 2018-12-13 LAB — BAYER DCA HB A1C WAIVED: HB A1C (BAYER DCA - WAIVED): 6.1 % (ref <7.0)

## 2018-12-13 LAB — MICROALBUMIN, URINE WAIVED
Creatinine, Urine Waived: 50 mg/dL (ref 10–300)
Microalb, Ur Waived: 80 mg/L — ABNORMAL HIGH (ref 0–19)

## 2018-12-13 LAB — URINE CULTURE, REFLEX

## 2018-12-13 MED ORDER — AMOXICILLIN 875 MG PO TABS: 875.0000 mg | ORAL_TABLET | Freq: Two times a day (BID) | ORAL | 0 refills | Status: DC

## 2018-12-13 MED ORDER — HYDROCHLOROTHIAZIDE 25 MG PO TABS: ORAL_TABLET | ORAL | 1 refills | Status: DC

## 2018-12-13 MED ORDER — GLIPIZIDE ER 2.5 MG PO TB24: 2.5000 mg | ORAL_TABLET | Freq: Every day | ORAL | 1 refills | Status: DC

## 2018-12-13 MED ORDER — METFORMIN HCL 1000 MG PO TABS: ORAL_TABLET | ORAL | 1 refills | Status: DC

## 2018-12-13 MED ORDER — CARVEDILOL 6.25 MG PO TABS: 6.2500 mg | ORAL_TABLET | Freq: Two times a day (BID) | ORAL | 1 refills | Status: DC

## 2018-12-13 MED ORDER — MIRTAZAPINE 15 MG PO TABS: 15.0000 mg | ORAL_TABLET | Freq: Every day | ORAL | 1 refills | Status: DC

## 2018-12-13 MED ORDER — TRAZODONE HCL 100 MG PO TABS: 100.0000 mg | ORAL_TABLET | Freq: Every day | ORAL | 1 refills | Status: DC

## 2018-12-13 MED ORDER — ENALAPRIL MALEATE 10 MG PO TABS: 10.0000 mg | ORAL_TABLET | Freq: Every day | ORAL | 1 refills | Status: DC

## 2018-12-13 MED ORDER — FLUTICASONE PROPIONATE 50 MCG/ACT NA SUSP: 2.0000 | Freq: Two times a day (BID) | NASAL | 6 refills | Status: DC

## 2018-12-13 MED ORDER — LOVASTATIN 40 MG PO TABS: 40.0000 mg | ORAL_TABLET | Freq: Every day | ORAL | 1 refills | Status: DC

## 2018-12-13 MED ORDER — GABAPENTIN 300 MG PO CAPS: 600.0000 mg | ORAL_CAPSULE | Freq: Two times a day (BID) | ORAL | 1 refills | Status: DC

## 2018-12-13 MED ORDER — INSULIN GLARGINE 100 UNIT/ML SOLOSTAR PEN: 15.0000 [IU] | PEN_INJECTOR | Freq: Every day | SUBCUTANEOUS | 1 refills | Status: DC

## 2018-12-13 NOTE — Telephone Encounter (Signed)
Secure message sent to Zara Council to see if she could see Marden Noble earlier due to his concern about a testicular cyst. She stated that she could, but we were not able to get Marden Noble on the phone to get him scheduled. Please reach out to him on Monday to see if we can get him situated. Thanks!  I've also forwarded this to [facility] as a reminder of our conversation.

## 2018-12-13 NOTE — Progress Notes (Signed)
BP 128/78   Pulse 72   Ht 5\' 6"  (1.676 m)   BMI 26.64 kg/m    Subjective:    Patient ID: Rick [name], male    DOB: 09/22/1940, 78 y.o.   MRN: [id number]  HPI: Rick [name] is a 78 y.o. male  Chief Complaint  Patient presents with  . Diabetes   HYPERTENSION / HYPERLIPIDEMIA Satisfied with current treatment? yes Duration of hypertension: chronic BP monitoring frequency: a few times a month BP medication side effects: no Past BP meds: carvedilol, enlapril, HCTZ Duration of hyperlipidemia: chronic Cholesterol medication side effects: no Cholesterol supplements: none Past cholesterol medications: lovastatin Medication compliance: excellent compliance Aspirin: no Recent stressors: yes Recurrent headaches: no Visual changes: no Palpitations: no Dyspnea: no Chest pain: no Lower extremity edema: no Dizzy/lightheaded: no  DIABETES Hypoglycemic episodes:no Polydipsia/polyuria: no Visual disturbance: no Chest pain: no Paresthesias: no Glucose Monitoring: yes  Accucheck frequency: Daily Taking Insulin?: yes Blood Pressure Monitoring: not checking Retinal Examination: Not up to Date Foot Exam: Up to Date Diabetic Education: Completed Pneumovax: Up to Date Influenza: Up to Date Aspirin: no   Doug notes that he has a new testicular cyst. He states that it is a growth. It is not painful. He is very concerned about it. He is concerned that it is causing his recurrent UTIs. He is also concerned that he may have to have his testicle removed. He notes that he talked with Dr. Gloriann Loan about having his testicle removed, although it is unclear if that was about this cyst or another issue. He notes that he is burning and peeing more frequently again. He is very concerned about his urine. He has an appointment with urology on 01/08/19 and would like to see if that can be moved up. He has not called urology.   BIPOLAR Mood status: stable Satisfied with current treatment?: yes  Symptom severity: moderate  Duration of current treatment : chronic Side effects: no Medication compliance: good compliance Psychotherapy/counseling: no  Depressed mood: no Anxious mood: yes Anhedonia: no Significant weight loss or gain: no Insomnia: no  Fatigue: no Feelings of worthlessness or guilt: no Impaired concentration/indecisiveness: no Suicidal ideations: no Hopelessness: no Crying spells: no Depression screen [facility] 2/9 01/02/2018 09/06/2017 12/07/2016 09/05/2016 08/05/2015  Decreased Interest 1 0 0 0 0  Down, Depressed, Hopeless 1 0 0 0 1  PHQ - 2 Score 2 0 0 0 1  Altered sleeping 3 0 - - -  Tired, decreased energy 1 3 - - -  Change in appetite 0 0 - - -  Feeling bad or failure about yourself  0 0 - - -  Trouble concentrating 0 0 - - -  Moving slowly or fidgety/restless 1 0 - - -  Suicidal thoughts 1 0 - - -  PHQ-9 Score 8 3 - - -      Relevant past medical, surgical, family and social history reviewed and updated as indicated. Interim medical history since our last visit reviewed. Allergies and medications reviewed and updated.  Review of Systems  Constitutional: Negative.   Respiratory: Negative.   Cardiovascular: Negative.   Gastrointestinal: Negative.   Musculoskeletal: Negative.   Neurological: Negative.   Psychiatric/Behavioral: Negative.     Per HPI unless specifically indicated above     Objective:    BP 128/78   Pulse 72   Ht 5\' 6"  (1.676 m)   BMI 26.64 kg/m   Wt Readings from Last 3 Encounters:  10/24/18 165 lb  1 oz (74.9 kg)  09/02/18 163 lb (73.9 kg)  08/27/18 166 lb (75.3 kg)    Physical Exam Vitals signs and nursing note reviewed.  Pulmonary:     Effort: Pulmonary effort is normal. No respiratory distress.     Comments: Speaking in full sentences Neurological:     Mental Status: He is alert.  Psychiatric:        Mood and Affect: Mood normal.        Behavior: Behavior normal.        Thought Content: Thought content normal.         Judgment: Judgment normal.     Results for orders placed or performed in visit on 12/11/18  Microscopic Examination  Result Value Ref Range   WBC, UA >30 (A) 0 - 5 /hpf   RBC None seen 0 - 2 /hpf   Epithelial Cells (non renal) 0-10 0 - 10 /hpf   Casts Present None seen /lpf   Cast Type Granular casts (A) N/A   Bacteria, UA Many (A) None seen/Few  Urine Culture, Reflex  Result Value Ref Range   Urine Culture, Routine Preliminary report (A)    Organism ID, Bacteria Escherichia coli (A)   UA/M w/rflx Culture, Routine  Result Value Ref Range   Specific Gravity, UA 1.015 1.005 - 1.030   pH, UA 5.5 5.0 - 7.5   Color, UA Yellow Yellow   Appearance Ur Hazy (A) Clear   Leukocytes,UA 3+ (A) Negative   Protein,UA Negative Negative/Trace   Glucose, UA Negative Negative   Ketones, UA Negative Negative   RBC, UA Negative Negative   Bilirubin, UA Negative Negative   Urobilinogen, Ur 0.2 0.2 - 1.0 mg/dL   Nitrite, UA Negative Negative   Microscopic Examination See below:    Urinalysis Reflex Comment   TSH  Result Value Ref Range   TSH 1.540 0.450 - 4.500 uIU/mL  PSA  Result Value Ref Range   Prostate Specific Ag, Serum 0.9 0.0 - 4.0 ng/mL  Microalbumin, Urine Waived  Result Value Ref Range   Microalb, Ur Waived 80 (H) 0 - 19 mg/L   Creatinine, Urine Waived 50 10 - 300 mg/dL   Microalb/Creat Ratio 30-300 (H) <30 mg/g  Lipid Panel w/o Chol/HDL Ratio  Result Value Ref Range   Cholesterol, Total 193 100 - 199 mg/dL   Triglycerides 154 (H) 0 - 149 mg/dL   HDL 49 >39 mg/dL   VLDL Cholesterol Cal 31 5 - 40 mg/dL   LDL Calculated 113 (H) 0 - 99 mg/dL  Comprehensive metabolic panel  Result Value Ref Range   Glucose 88 65 - 99 mg/dL   BUN 18 8 - 27 mg/dL   Creatinine, Ser 1.00 0.76 - 1.27 mg/dL   GFR calc non Af Amer 72 >59 mL/min/1.73   GFR calc Af Amer 84 >59 mL/min/1.73   BUN/Creatinine Ratio 18 10 - 24   Sodium 140 134 - 144 mmol/L   Potassium 4.4 3.5 - 5.2 mmol/L   Chloride  101 96 - 106 mmol/L   CO2 20 20 - 29 mmol/L   Calcium 10.4 (H) 8.6 - 10.2 mg/dL   Total Protein 8.0 6.0 - 8.5 g/dL   Albumin 4.8 (H) 3.7 - 4.7 g/dL   Globulin, Total 3.2 1.5 - 4.5 g/dL   Albumin/Globulin Ratio 1.5 1.2 - 2.2   Bilirubin Total 0.6 0.0 - 1.2 mg/dL   Alkaline Phosphatase 62 39 - 117 IU/L   AST 25 0 -  40 IU/L   ALT 9 0 - 44 IU/L  CBC with Differential/Platelet  Result Value Ref Range   WBC 7.6 3.4 - 10.8 x10E3/uL   RBC 5.16 4.14 - 5.80 x10E6/uL   Hemoglobin 14.7 13.0 - 17.7 g/dL   Hematocrit 43.6 37.5 - 51.0 %   MCV 85 79 - 97 fL   MCH 28.5 26.6 - 33.0 pg   MCHC 33.7 31.5 - 35.7 g/dL   RDW 13.2 11.6 - 15.4 %   Platelets 189 150 - 450 x10E3/uL   Neutrophils 61 Not Estab. %   Lymphs 26 Not Estab. %   Monocytes 8 Not Estab. %   Eos 4 Not Estab. %   Basos 1 Not Estab. %   Neutrophils Absolute 4.7 1.4 - 7.0 x10E3/uL   Lymphocytes Absolute 2.0 0.7 - 3.1 x10E3/uL   Monocytes Absolute 0.6 0.1 - 0.9 x10E3/uL   EOS (ABSOLUTE) 0.3 0.0 - 0.4 x10E3/uL   Basophils Absolute 0.1 0.0 - 0.2 x10E3/uL   Immature Granulocytes 0 Not Estab. %   Immature Grans (Abs) 0.0 0.0 - 0.1 x10E3/uL  Bayer DCA Hb A1c Waived  Result Value Ref Range   HB A1C (BAYER DCA - WAIVED) 6.1 <7.0 %      Assessment & Plan:   Problem List Items Addressed This Visit      Cardiovascular and Mediastinum   Hypertension    Under good control on current regimen. Continue current regimen. Continue to monitor. Call with any concerns. Refills given. Labs drawn and reviewed today.       Relevant Medications   enalapril (VASOTEC) 10 MG tablet   carvedilol (COREG) 6.25 MG tablet   hydrochlorothiazide (HYDRODIURIL) 25 MG tablet   lovastatin (MEVACOR) 40 MG tablet     Nervous and Auditory   Diabetic peripheral neuropathy associated with type 2 diabetes mellitus ([place]) - Primary    Under good control on current regimen with a1c of 6.1. Continue current regimen. Continue to monitor. Call with any concerns.  Refills given. Labs drawn and reviewed today.      Relevant Medications   enalapril (VASOTEC) 10 MG tablet   gabapentin (NEURONTIN) 300 MG capsule   glipiZIDE (GLUCOTROL XL) 2.5 MG 24 hr tablet   Insulin Glargine (LANTUS SOLOSTAR) 100 UNIT/ML Solostar Pen   lovastatin (MEVACOR) 40 MG tablet   metFORMIN (GLUCOPHAGE) 1000 MG tablet   mirtazapine (REMERON) 15 MG tablet   traZODone (DESYREL) 100 MG tablet     Genitourinary   Recurrent UTI    Has another UTI with e. Coli- usually sensitive to amoxicillin. Will treat as we await culture. Call with any concerns. Has appointment to see Urology in 4 weeks, we will see if they can move that up just a bit.         Other   Affective bipolar disorder ([place])    Stable at this time. Continue on remeron. Refuses psychiatry. Continue to monitor.       Hyperlipidemia    Under good control on current regimen. Continue current regimen. Continue to monitor. Call with any concerns. Refills given. Labs drawn and reviewed today.       Relevant Medications   enalapril (VASOTEC) 10 MG tablet   carvedilol (COREG) 6.25 MG tablet   hydrochlorothiazide (HYDRODIURIL) 25 MG tablet   lovastatin (MEVACOR) 40 MG tablet    Other Visit Diagnoses    Dyslipidemia       Relevant Medications   lovastatin (MEVACOR) 40 MG tablet   Testicular  cyst       New complaint. States he is very concerned about it. Has appointment with urology on 5/27- we will see if they can get that moved up a little bit.        Follow up plan: Return in about 3 months (around 03/15/2019) for follow up DM.   [name] This visit was completed via telephone due to the restrictions of the COVID-19 pandemic. All issues as above were discussed and addressed but no physical exam was performed. If it was felt that the patient should be evaluated in the office, they were directed there. The patient verbally consented to this visit. Patient was unable to complete an audio/visual visit due to Lack of  equipment. Due to the catastrophic nature of the COVID-19 pandemic, this visit was done through audio contact only. . Location of the patient: home . Location of the provider: work . Those involved with this call:  . Provider: Park Liter, DO . CMA: Lesle Chris, [place] . Front Desk/Registration: Don Perking  . Time spent on call: 30 minutes on the phone discussing health concerns. 45 minutes total spent in review of patient's record and preparation of their chart.

## 2018-12-13 NOTE — Assessment & Plan Note (Signed)
Under good control on current regimen. Continue current regimen. Continue to monitor. Call with any concerns. Refills given. Labs drawn and reviewed today.

## 2018-12-13 NOTE — Assessment & Plan Note (Signed)
Has another UTI with e. Coli- usually sensitive to amoxicillin. Will treat as we await culture. Call with any concerns. Has appointment to see Urology in 4 weeks, we will see if they can move that up just a bit.

## 2018-12-13 NOTE — Assessment & Plan Note (Signed)
Under good control on current regimen with a1c of 6.1. Continue current regimen. Continue to monitor. Call with any concerns. Refills given. Labs drawn and reviewed today.

## 2018-12-13 NOTE — Assessment & Plan Note (Signed)
Stable at this time. Continue on remeron. Refuses psychiatry. Continue to monitor.

## 2018-12-15 ENCOUNTER — Telehealth: Payer: Self-pay | Admitting: Urology

## 2018-12-15 NOTE — Telephone Encounter (Signed)
Would you call Rick [name] and get him in for an appointment today (12/16/2018) for scrotal issues?

## 2018-12-16 NOTE — Telephone Encounter (Addendum)
Pt calling to speak with the office manager concerning this referral to [facility]. Pt is upset to learn he has been dismissed from their office. Pt would like a call back from the office at  11 am from the office manager or Dr Wynetta Emery directly, "no one any less". Pt states this is a serious issue, and he is going to take the issue to [facility]. Pt was very bossy and demanded to know who it would be calling him back.  Pt states he will be sitting by the phone at 11 am and expects a call. (he has to go out a little while)    Pt states he has reached out to Dr Yves Dill and does not believe Dr Yves Dill will see him either.

## 2018-12-16 NOTE — Telephone Encounter (Signed)
Patient has been dismissed from our practice per Dr. Bernardo Heater We can not see him   Sharyn Lull

## 2018-12-16 NOTE — Telephone Encounter (Signed)
Secure Message from [place] [employer] informed me that Rick [name] has been discharged from [facility] for violent behavior and non-compliance. He cannot be seen there. He does not want to continue to drive to [place] on my last conversation with him. Can you please change his referral over to Dr. Yves Dill and see if you can get him in sooner rather than later? Thanks!

## 2018-12-16 NOTE — Telephone Encounter (Signed)
Called and spoke with patient. Relayed message. Patient going to call [facility] and see about getting appointment scheduled sooner.   [facility], 1st floor, [address], [place], [place] [address] Phone: [phone]

## 2018-12-16 NOTE — Telephone Encounter (Signed)
I'm not sure when this initially came across, but it doesn't appear to have been routed. Unfortunately [facility] will not see patient based on his previous behavior in their clinic. Dr. Yves Dill is not taking new medicare patients. I am unaware of another urologist in [facility]. Unfortunately, this means that patient will have to go out of the county for his urologic care. I'm happy to get him back to see Dr. Gloriann Loan in [place], who he has seen previously or to [facility] in [place] who he has also seen previously. I understand patient's concern about driving, but we don't really have any choice in this matter. Any suggestions would be very welcome.

## 2018-12-16 NOTE — Telephone Encounter (Signed)
I have put a secure chat message to my office manager to contact Rick [name].

## 2018-12-16 NOTE — Telephone Encounter (Signed)
Pt called back in to make PCP aware that Dr. Rogers Blocker is not taking new Medicare patients.

## 2018-12-18 NOTE — Telephone Encounter (Signed)
REferral to C3 made today

## 2018-12-18 NOTE — Telephone Encounter (Signed)
Dr. Wynetta Emery,  I spoke with [facility] and you were right he has an issue with driving to [facility].  He says that he would like to keep his appointment scheduled with Dr. Gloriann Loan on 01/02/19 @ 3:15pm if we can find transportation to get him there.  I told him that you would place a CCM referral for him and that someone would call him to hopefully set something up.  He was delighted to hear that and said he would gladly go to [place] if that could happen.  Thanks

## 2018-12-23 ENCOUNTER — Other Ambulatory Visit: Payer: Self-pay | Admitting: Urology

## 2018-12-24 ENCOUNTER — Telehealth: Payer: Self-pay | Admitting: Family Medicine

## 2018-12-24 NOTE — Telephone Encounter (Signed)
Sent patient's transportation request to [employer] with [facility]. Curt Bears will contact patient to discuss. Copied from [place] [phone]. Topic: General - Other >> Dec 23, 2018 12:20 PM Leward Quan A wrote: Reason for CRM: Patient called to inquire about a ride that he say Barth Kirks was arranging for him to get to the [facility] on 01/08/2019. He is requesting a call back on Ph# [phone] with some additional information please. Today if possible >> Dec 23, 2018  1:38 PM Yvette Rack wrote: Pt called in again requesting to speak with Barth Kirks. Pt requests a call back. Cb# [phone] >> Dec 23, 2018  5:31 PM Barth Kirks B wrote: Spoke with patient regarding his transportation needs. Advised patient that I would contact  the C3 team to insure they will be able to assist with transportation on 5/15 and 5/22.

## 2018-12-25 ENCOUNTER — Telehealth: Payer: Self-pay | Admitting: Family Medicine

## 2018-12-25 NOTE — Telephone Encounter (Signed)
° ° °  Called Pt regarding [name] Referral for transportation. Spoke with pt to discuss options to [facility] appt on 5/22. Pt is not eligible for Medicaid transportation through [place] verified by calling Aris Everts, he receives food stamps but is not on Medicaid. PART is not running the route from [place] to [place] until August. Gave him suggestion to call his Medicare Advantage insurer to see if they had a transportation option, gave him # to [employer] to check on volunteer services for transportation. Pt mentioned that he would check with [facility] in [place] to see if he could have his surgery there as he feels safer traveling there instead of on [address] and [address] to [place]. He will find out if he needs referral. Stated that he may have his daughter or ex-wife take him to appointment and hung up. Patient was very frustrated with [facility] and said he filed a formal complaint with his insurer. 34 minute call   [place]  ??[email or web address]  ?? [phone]   Skype

## 2018-12-25 NOTE — Telephone Encounter (Signed)
Dr. Wynetta Emery,  Just wanted to make you aware of Kathryn's correspondence with Mr. Terhune.  I am disappointed that they could not help.  Thanks, Alwyn Ren

## 2018-12-27 ENCOUNTER — Inpatient Hospital Stay (HOSPITAL_COMMUNITY): Admission: RE | Admit: 2018-12-27 | Payer: Medicare Other | Source: Ambulatory Visit

## 2018-12-30 NOTE — Progress Notes (Signed)
SPOKE W/ PT_     SCREENING SYMPTOMS OF COVID 19:   COUGH--No  RUNNY NOSE--- No  SORE THROAT---No  NASAL CONGESTION----No  SNEEZING----No  SHORTNESS OF BREATH---No  DIFFICULTY BREATHING---No  TEMP >100.0 -----No  UNEXPLAINED BODY ACHES------No  CHILLS --------No  HEADACHES ---------No  LOSS OF SMELL/ TASTE --------No    HAVE YOU OR ANY FAMILY MEMBER TRAVELLED PAST 14 DAYS OUT OF THE   COUNTY---No STATE----No COUNTRY----No  HAVE YOU OR ANY FAMILY MEMBER BEEN EXPOSED TO ANYONE WITH COVID 19? No

## 2018-12-30 NOTE — Patient Instructions (Addendum)
AUDWIN SEMPER  12/30/2018   Your procedure is scheduled on: 01-03-19    Report to [facility] Main  Entrance    Report to Admitting at 10:00 AM   YOU NEED TO HAVE A COVID 19 TEST ON 12-31-18, THIS TEST MUST BE DONE BEFORE SURGERY, COME TO [place] ENTRANCE BETWEEN THE HOURS OF 900 AM AND 300 PM ON YOUR COVID TEST DATE.   Call this number if you have problems the morning of surgery [phone]    Remember: Do not eat food or drink liquids :After Midnight.    BRUSH YOUR TEETH MORNING OF SURGERY AND RINSE YOUR MOUTH OUT, NO CHEWING GUM CANDY OR MINTS.     Take these medicines the morning of surgery with A SIP OF WATER: Carbidopa-Levodopa (Sinemet IR), Carvedilol (Coreg), Gabapentin (Neurontin). You may also bring and use your nasal spray as needed.   DO NOT TAKE ANY DIABETIC MEDICATIONS DAY OF YOUR SURGERY                               You may not have any metal on your body including hair pins and              piercings     Do not wear jewelry, cologne, lotions, powders or perfumes, deodorant                          Men may shave face and neck.   Do not bring valuables to the hospital. [place].  Contacts, dentures or bridgework may not be worn into surgery.       Patients discharged the day of surgery will not be allowed to drive home. IF YOU ARE HAVING SURGERY AND GOING HOME THE SAME DAY, YOU MUST HAVE AN ADULT TO DRIVE YOU HOME AND BE WITH YOU FOR 24 HOURS. YOU MAY GO HOME BY TAXI OR UBER OR ORTHERWISE, BUT AN ADULT MUST ACCOMPANY YOU HOME AND STAY WITH YOU FOR 24 HOURS.    Name and phone number of your driver: Ronnell Freshwater [phone]                Please read over the following fact sheets you were given: _____________________________________________________________________ How to Manage Your Diabetes Before and After Surgery  Why is it important to control my  blood sugar before and after surgery? . Improving blood sugar levels before and after surgery helps healing and can limit problems. . A way of improving blood sugar control is eating a healthy diet by: o  Eating less sugar and carbohydrates o  Increasing activity/exercise o  Talking with your doctor about reaching your blood sugar goals . High blood sugars (greater than 180 mg/dL) can raise your risk of infections and slow your recovery, so you will need to focus on controlling your diabetes during the weeks before surgery. . Make sure that the doctor who takes care of your diabetes knows about your planned surgery including the date and location.  How do I manage my blood sugar before surgery? . Check your blood sugar at least 4 times a day, starting 2 days before surgery, to make sure that the level is not too high  or low. o Check your blood sugar the morning of your surgery when you wake up and every 2 hours until you get to the Short Stay unit. . If your blood sugar is less than 70 mg/dL, you will need to treat for low blood sugar: o Do not take insulin. o Treat a low blood sugar (less than 70 mg/dL) with  cup of clear juice (cranberry or apple), 4 glucose tablets, OR glucose gel. o Recheck blood sugar in 15 minutes after treatment (to make sure it is greater than 70 mg/dL). If your blood sugar is not greater than 70 mg/dL on recheck, call [phone] for further instructions. . Report your blood sugar to the short stay nurse when you get to Short Stay.  . If you are admitted to the hospital after surgery: o Your blood sugar will be checked by the staff and you will probably be given insulin after surgery (instead of oral diabetes medicines) to make sure you have good blood sugar levels. o The goal for blood sugar control after surgery is 80-180 mg/dL.   WHAT DO I DO ABOUT MY DIABETES MEDICATION?  [name] Do not take oral diabetes medicines (pills) the morning of surgery.  . THE DAY BEFORE  SURGERY, take  your morning and/or lunch dose of Glipizide, your usual Metformin, and only 7 units of your bedtime Lantus insulin.        Reviewed and Endorsed by [facility] Patient Education Committee, August 2015            [facility] - Preparing for Surgery Before surgery, you can play an important role.  Because skin is not sterile, your skin needs to be as free of germs as possible.  You can reduce the number of germs on your skin by washing with CHG (chlorahexidine gluconate) soap before surgery.  CHG is an antiseptic cleaner which kills germs and bonds with the skin to continue killing germs even after washing. Please DO NOT use if you have an allergy to CHG or antibacterial soaps.  If your skin becomes reddened/irritated stop using the CHG and inform your nurse when you arrive at Short Stay. Do not shave (including legs and underarms) for at least 48 hours prior to the first CHG shower.  You may shave your face/neck. Please follow these instructions carefully:  1.  Shower with CHG Soap the night before surgery and the  morning of Surgery.  2.  If you choose to wash your hair, wash your hair first as usual with your  normal  shampoo.  3.  After you shampoo, rinse your hair and body thoroughly to remove the  shampoo.                           4.  Use CHG as you would any other liquid soap.  You can apply chg directly  to the skin and wash                       Gently with a scrungie or clean washcloth.  5.  Apply the CHG Soap to your body ONLY FROM THE NECK DOWN.   Do not use on face/ open                           Wound or open sores. Avoid contact with eyes, ears mouth and genitals (private parts).  Wash face,  Genitals (private parts) with your normal soap.             6.  Wash thoroughly, paying special attention to the area where your surgery  will be performed.  7.  Thoroughly rinse your body with warm water from the neck down.  8.  DO NOT shower/wash with  your normal soap after using and rinsing off  the CHG Soap.                9.  Pat yourself dry with a clean towel.            10.  Wear clean pajamas.            11.  Place clean sheets on your bed the night of your first shower and do not  sleep with pets. Day of Surgery : Do not apply any lotions/deodorants the morning of surgery.  Please wear clean clothes to the hospital/surgery center.  FAILURE TO FOLLOW THESE INSTRUCTIONS MAY RESULT IN THE CANCELLATION OF YOUR SURGERY PATIENT SIGNATURE_________________________________  NURSE SIGNATURE__________________________________  ________________________________________________________________________

## 2018-12-31 ENCOUNTER — Encounter (HOSPITAL_COMMUNITY)
Admission: RE | Admit: 2018-12-31 | Discharge: 2018-12-31 | Disposition: A | Discharge status: Home / Self Care | Payer: Medicare Other | Source: Ambulatory Visit | Attending: Urology | Admitting: Urology

## 2018-12-31 ENCOUNTER — Encounter (HOSPITAL_COMMUNITY): Payer: Self-pay

## 2018-12-31 ENCOUNTER — Other Ambulatory Visit: Payer: Self-pay

## 2018-12-31 ENCOUNTER — Other Ambulatory Visit (HOSPITAL_COMMUNITY)
Admission: RE | Admit: 2018-12-31 | Discharge: 2018-12-31 | Disposition: A | Discharge status: Home / Self Care | Payer: Medicare Other | Source: Ambulatory Visit | Attending: Urology | Admitting: Urology

## 2018-12-31 DIAGNOSIS — Z85828 Personal history of other malignant neoplasm of skin: Secondary | ICD-10-CM | POA: Diagnosis not present

## 2018-12-31 DIAGNOSIS — E7800 Pure hypercholesterolemia, unspecified: Secondary | ICD-10-CM | POA: Diagnosis not present

## 2018-12-31 DIAGNOSIS — Z01812 Encounter for preprocedural laboratory examination: Secondary | ICD-10-CM | POA: Insufficient documentation

## 2018-12-31 DIAGNOSIS — N503 Cyst of epididymis: Secondary | ICD-10-CM | POA: Insufficient documentation

## 2018-12-31 DIAGNOSIS — Z87891 Personal history of nicotine dependence: Secondary | ICD-10-CM | POA: Diagnosis not present

## 2018-12-31 DIAGNOSIS — Z794 Long term (current) use of insulin: Secondary | ICD-10-CM | POA: Diagnosis not present

## 2018-12-31 DIAGNOSIS — E1151 Type 2 diabetes mellitus with diabetic peripheral angiopathy without gangrene: Secondary | ICD-10-CM | POA: Diagnosis not present

## 2018-12-31 DIAGNOSIS — F329 Major depressive disorder, single episode, unspecified: Secondary | ICD-10-CM | POA: Diagnosis not present

## 2018-12-31 DIAGNOSIS — G20 Parkinson's disease: Secondary | ICD-10-CM | POA: Diagnosis not present

## 2018-12-31 DIAGNOSIS — F319 Bipolar disorder, unspecified: Secondary | ICD-10-CM | POA: Diagnosis not present

## 2018-12-31 DIAGNOSIS — Z79899 Other long term (current) drug therapy: Secondary | ICD-10-CM | POA: Diagnosis not present

## 2018-12-31 DIAGNOSIS — I10 Essential (primary) hypertension: Secondary | ICD-10-CM | POA: Diagnosis not present

## 2018-12-31 DIAGNOSIS — N433 Hydrocele, unspecified: Secondary | ICD-10-CM | POA: Diagnosis not present

## 2018-12-31 DIAGNOSIS — Z1159 Encounter for screening for other viral diseases: Secondary | ICD-10-CM | POA: Insufficient documentation

## 2018-12-31 LAB — CBC
HCT: 43.7 % (ref 39.0–52.0)
Hemoglobin: 14.2 g/dL (ref 13.0–17.0)
MCH: 28.8 pg (ref 26.0–34.0)
MCHC: 32.5 g/dL (ref 30.0–36.0)
MCV: 88.6 fL (ref 80.0–100.0)
Platelets: 242 10*3/uL (ref 150–400)
RBC: 4.93 MIL/uL (ref 4.22–5.81)
RDW: 13.2 % (ref 11.5–15.5)
WBC: 8.9 10*3/uL (ref 4.0–10.5)
nRBC: 0 % (ref 0.0–0.2)

## 2018-12-31 LAB — HEMOGLOBIN A1C
Hgb A1c MFr Bld: 6.1 % — ABNORMAL HIGH (ref 4.8–5.6)
Mean Plasma Glucose: 128.37 mg/dL

## 2018-12-31 LAB — BASIC METABOLIC PANEL
Anion gap: 11 (ref 5–15)
BUN: 21 mg/dL (ref 8–23)
CO2: 21 mmol/L — ABNORMAL LOW (ref 22–32)
Calcium: 9.3 mg/dL (ref 8.9–10.3)
Chloride: 107 mmol/L (ref 98–111)
Creatinine, Ser: 0.93 mg/dL (ref 0.61–1.24)
GFR calc Af Amer: >60 mL/min (ref >60)
GFR calc non Af Amer: >60 mL/min (ref >60)
Glucose, Bld: 93 mg/dL (ref 70–99)
Potassium: 3.9 mmol/L (ref 3.5–5.1)
Sodium: 139 mmol/L (ref 135–145)

## 2018-12-31 LAB — GLUCOSE, CAPILLARY: Glucose-Capillary: 93 mg/dL (ref 70–99)

## 2019-01-01 LAB — NOVEL CORONAVIRUS, NAA (HOSP ORDER, SEND-OUT TO REF LAB; TAT 18-24 HRS): SARS-CoV-2, NAA: NOT DETECTED

## 2019-01-01 NOTE — Progress Notes (Signed)
04-01-18 ( Epic) EKG  Successful transmission of fax to surgeon and blood bank for 'Refusal of All Blood And/Or Blood Products.

## 2019-01-01 NOTE — Anesthesia Preprocedure Evaluation (Addendum)
Anesthesia Evaluation  Patient identified by MRN, date of birth, ID band Patient awake    Reviewed: Allergy & Precautions, NPO status , Patient's Chart, lab work & pertinent test results  Airway Mallampati: II  TM Distance: >3 FB Neck ROM: Full    Dental no notable dental hx.    Pulmonary neg pulmonary ROS, former smoker,    Pulmonary exam normal breath sounds clear to auscultation       Cardiovascular hypertension, Normal cardiovascular exam Rhythm:Regular Rate:Normal     Neuro/Psych Bipolar Disorder negative neurological ROS     GI/Hepatic negative GI ROS, Neg liver ROS,   Endo/Other  diabetes  Renal/GU negative Renal ROS  negative genitourinary   Musculoskeletal negative musculoskeletal ROS (+)   Abdominal   Peds negative pediatric ROS (+)  Hematology negative hematology ROS (+)   Anesthesia Other Findings   Reproductive/Obstetrics negative OB ROS                            Anesthesia Physical Anesthesia Plan  ASA: II  Anesthesia Plan: General   Post-op Pain Management:    Induction: Intravenous and Rapid sequence  PONV Risk Score and Plan: 2 and Ondansetron, Dexamethasone and Treatment may vary due to age or medical condition  Airway Management Planned: Oral ETT  Additional Equipment:   Intra-op Plan:   Post-operative Plan: Extubation in OR  Informed Consent: I have reviewed the patients History and Physical, chart, labs and discussed the procedure including the risks, benefits and alternatives for the proposed anesthesia with the patient or authorized representative who has indicated his/her understanding and acceptance.     Dental advisory given  Plan Discussed with: CRNA and Surgeon  Anesthesia Plan Comments: (See PAT note 12/31/2018, Konrad Felix, PA-C)       Anesthesia Quick Evaluation

## 2019-01-01 NOTE — Progress Notes (Signed)
Anesthesia Chart Review   Case:  [id number] Date/Time:  01/03/19 1145   Procedure:  RIGHT EPIDIDYMAL CYST REMOVAL VERSES EPIDIDMECTOMY (N/A )   Anesthesia type:  General   Pre-op diagnosis:  right epididymal cyst   Location:  [facility] ROOM 03 / [facility]   Surgeon:  Lucas Mallow, MD      DISCUSSION: 78 yo former smoker (22.5 pack years, quit 03/23/05) with h/o HLD, BPH, bipolar disorder, anxiety, depression, HTN, insomnia, DM II, right epididymal cyst scheduled for above procedure 01/03/19 with Dr. Link Snuffer.    Pt last seen by PCP, Dr. Park Liter, 12/13/18.  Stable at this visit.    Pt can proceed with planned procedure barring acute status change.  VS: BP 138/63 (BP Location: Left Arm)   Pulse 78   Temp 36.8 C (Oral)   Resp 18   Ht '5\' 7"'  (1.702 m)   Wt 76.9 kg   SpO2 100%   BMI 26.56 kg/m   PROVIDERS: [name], DO is PCP    LABS: Labs reviewed: Acceptable for surgery. (all labs ordered are listed, but only abnormal results are displayed)  Labs Reviewed  HEMOGLOBIN A1C - Abnormal; Notable for the following components:      Result Value   Hgb A1c MFr Bld 6.1 (*)    All other components within normal limits  BASIC METABOLIC PANEL - Abnormal; Notable for the following components:   CO2 21 (*)    All other components within normal limits  CBC  GLUCOSE, CAPILLARY     IMAGES:   EKG: 03/22/18 Normal sinus rhythm with sinus arrhythmia Normal ECG No significant change since last tracing  CV: Echo 09/28/15 Study Conclusions  - Procedure narrative: Transthoracic echocardiography. The study   was technically difficult. - Left ventricle: Systolic function was normal. The estimated   ejection fraction was in the range of 55% to 65%. - Aortic valve: Valve area (Vmax): 3.85 cm^2. Past Medical History:  Diagnosis Date  . Anxiety   . Bipolar disorder ([place])   . Bladder cancer ([place])   . BPH (benign prostatic hypertrophy)   . Chest pain, atypical 06/23/2015  .  Cystitis    hx of   . Depression   . Diabetes mellitus without complication ([place])    type 2   . Difficult or painful urination 10/31/2012  . Dysrhythmia   . Ear problem 03/24/2015  . Gangrenous appendicitis   . H/O urinary disorder 03/27/2013  . Herpes genitalis in men   . Hyperlipidemia   . Hypertension   . Insomnia   . Kidney stones   . Left hand pain 05/18/2015  . Mass of arm 02/10/2015  . Peripheral neuropathy   . Skin cancer   . UD (urethral discharge) 10/31/2012  . Urinary tract infection    hx of     Past Surgical History:  Procedure Laterality Date  . APPENDECTOMY     RUPTURED  . BLADDER SURGERY    . CHOLECYSTECTOMY    . COLOSTOMY     AND LATER CLOSURE  . CYSTOSCOPY/URETEROSCOPY/HOLMIUM LASER/STENT PLACEMENT Left 03/22/2018   Procedure: CYSTOSCOPY/LEFT URETEROSCOPY/LEFT RETROGRADE PYELOGRAM;  Surgeon: Lucas Mallow, MD;  Location: [facility];  Service: Urology;  Laterality: Left;  . FRACTURE SURGERY    . HERNIA REPAIR    . INNER EAR SURGERY Left   . ORIF ANKLE FRACTURE Left 09/27/2015   Procedure: OPEN REDUCTION INTERNAL FIXATION (ORIF) ANKLE FRACTURE;  Surgeon: Corky Mull, MD;  Location: [facility];  Service: Orthopedics;  Laterality: Left;  . SKIN CANCER EXCISION    . TRANSURETHRAL RESECTION OF PROSTATE N/A 03/22/2018   Procedure: TRANSURETHRAL RESECTION OF THE PROSTATE (TURP);  Surgeon: Lucas Mallow, MD;  Location: [facility];  Service: Urology;  Laterality: N/A;  . VENTRAL HERNIA REPAIR N/A 01/14/2016   Procedure: HERNIA REPAIR VENTRAL ADULT;  Surgeon: Leonie Green, MD;  Location: [facility];  Service: General;  Laterality: N/A;    MEDICATIONS: . amoxicillin (AMOXIL) 875 MG tablet  . Blood Glucose Monitoring Suppl (ONE TOUCH ULTRA SYSTEM KIT) w/Device KIT  . carbidopa-levodopa (SINEMET IR) 25-100 MG tablet  . carvedilol (COREG) 6.25 MG tablet  . diclofenac sodium (VOLTAREN) 1 % GEL  . enalapril (VASOTEC) 10 MG tablet  . fluticasone (FLONASE) 50 MCG/ACT  nasal spray  . gabapentin (NEURONTIN) 300 MG capsule  . glipiZIDE (GLUCOTROL XL) 2.5 MG 24 hr tablet  . hydrochlorothiazide (HYDRODIURIL) 25 MG tablet  . Insulin Glargine (LANTUS SOLOSTAR) 100 UNIT/ML Solostar Pen  . Insulin Pen Needle (ULTICARE PEN NEEDLES) 29G X 12MM MISC  . Lancets (ONETOUCH ULTRASOFT) lancets  . lovastatin (MEVACOR) 40 MG tablet  . Menthol, Topical Analgesic, (BENGAY EX)  . metFORMIN (GLUCOPHAGE) 1000 MG tablet  . mirtazapine (REMERON) 15 MG tablet  . ONE TOUCH ULTRA TEST test strip  . phenazopyridine (PYRIDIUM) 100 MG tablet  . pseudoephedrine (SUDAFED) 30 MG tablet  . sildenafil (REVATIO) 20 MG tablet  . silodosin (RAPAFLO) 8 MG CAPS capsule  . traZODone (DESYREL) 100 MG tablet  . valACYclovir (VALTREX) 1000 MG tablet   No current facility-administered medications for this encounter.     Maia Plan [facility] [phone] 01/01/19 11:03 AM

## 2019-01-02 ENCOUNTER — Other Ambulatory Visit: Payer: Self-pay

## 2019-01-02 NOTE — Progress Notes (Signed)
SPOKE W/ Chipper Oman     SCREENING SYMPTOMS OF COVID 19:   COUGH--no  RUNNY NOSE--- no  SORE THROAT---no  NASAL CONGESTION----no  SNEEZING----no  SHORTNESS OF BREATH---no  DIFFICULTY BREATHING---no  TEMP >100.0 -----no  UNEXPLAINED BODY ACHES------no  CHILLS -------- no  HEADACHES ---------no  LOSS OF SMELL/ TASTE --------no    HAVE YOU OR ANY FAMILY MEMBER TRAVELLED PAST 14 DAYS OUT OF THE   COUNTY---no STATE----no COUNTRY----no  HAVE YOU OR ANY FAMILY MEMBER BEEN EXPOSED TO ANYONE WITH COVID 19? no

## 2019-01-03 ENCOUNTER — Encounter (HOSPITAL_COMMUNITY): Payer: Self-pay

## 2019-01-03 ENCOUNTER — Ambulatory Visit (HOSPITAL_COMMUNITY): Payer: Medicare Other | Admitting: Certified Registered Nurse Anesthetist

## 2019-01-03 ENCOUNTER — Encounter (HOSPITAL_COMMUNITY)
Admission: RE | Disposition: A | Discharge status: Home / Self Care | Payer: Self-pay | Source: Home / Self Care | Attending: Urology

## 2019-01-03 ENCOUNTER — Ambulatory Visit (HOSPITAL_COMMUNITY)
Admission: RE | Admit: 2019-01-03 | Discharge: 2019-01-03 | Disposition: A | Discharge status: Home / Self Care | Payer: Medicare Other | Attending: Urology | Admitting: Urology

## 2019-01-03 ENCOUNTER — Ambulatory Visit (HOSPITAL_COMMUNITY): Payer: Medicare Other | Admitting: Physician Assistant

## 2019-01-03 DIAGNOSIS — Z85828 Personal history of other malignant neoplasm of skin: Secondary | ICD-10-CM | POA: Diagnosis not present

## 2019-01-03 DIAGNOSIS — N433 Hydrocele, unspecified: Secondary | ICD-10-CM | POA: Diagnosis not present

## 2019-01-03 DIAGNOSIS — I10 Essential (primary) hypertension: Secondary | ICD-10-CM | POA: Insufficient documentation

## 2019-01-03 DIAGNOSIS — Z79899 Other long term (current) drug therapy: Secondary | ICD-10-CM | POA: Diagnosis not present

## 2019-01-03 DIAGNOSIS — Z1159 Encounter for screening for other viral diseases: Secondary | ICD-10-CM | POA: Diagnosis not present

## 2019-01-03 DIAGNOSIS — C679 Malignant neoplasm of bladder, unspecified: Secondary | ICD-10-CM | POA: Diagnosis not present

## 2019-01-03 DIAGNOSIS — Z794 Long term (current) use of insulin: Secondary | ICD-10-CM | POA: Insufficient documentation

## 2019-01-03 DIAGNOSIS — R6889 Other general symptoms and signs: Secondary | ICD-10-CM | POA: Diagnosis not present

## 2019-01-03 DIAGNOSIS — Z87891 Personal history of nicotine dependence: Secondary | ICD-10-CM | POA: Insufficient documentation

## 2019-01-03 DIAGNOSIS — E1151 Type 2 diabetes mellitus with diabetic peripheral angiopathy without gangrene: Secondary | ICD-10-CM | POA: Insufficient documentation

## 2019-01-03 DIAGNOSIS — E7800 Pure hypercholesterolemia, unspecified: Secondary | ICD-10-CM | POA: Diagnosis not present

## 2019-01-03 DIAGNOSIS — N503 Cyst of epididymis: Secondary | ICD-10-CM | POA: Insufficient documentation

## 2019-01-03 DIAGNOSIS — N451 Epididymitis: Secondary | ICD-10-CM | POA: Diagnosis not present

## 2019-01-03 DIAGNOSIS — N476 Balanoposthitis: Secondary | ICD-10-CM | POA: Diagnosis not present

## 2019-01-03 DIAGNOSIS — F319 Bipolar disorder, unspecified: Secondary | ICD-10-CM | POA: Insufficient documentation

## 2019-01-03 DIAGNOSIS — Z01812 Encounter for preprocedural laboratory examination: Secondary | ICD-10-CM | POA: Diagnosis not present

## 2019-01-03 DIAGNOSIS — G20 Parkinson's disease: Secondary | ICD-10-CM | POA: Diagnosis not present

## 2019-01-03 DIAGNOSIS — F329 Major depressive disorder, single episode, unspecified: Secondary | ICD-10-CM | POA: Insufficient documentation

## 2019-01-03 HISTORY — PX: EPIDIDYMECTOMY: SHX6275

## 2019-01-03 LAB — GLUCOSE, CAPILLARY
Glucose-Capillary: 111 mg/dL — ABNORMAL HIGH (ref 70–99)
Glucose-Capillary: 102 mg/dL — ABNORMAL HIGH (ref 70–99)

## 2019-01-03 LAB — NO BLOOD PRODUCTS

## 2019-01-03 SURGERY — EPIDIDYMECTOMY
Anesthesia: General

## 2019-01-03 MED ORDER — EPHEDRINE SULFATE-NACL 50-0.9 MG/10ML-% IV SOSY
PREFILLED_SYRINGE | INTRAVENOUS | Status: DC | PRN
  Administered 2019-01-03: 5 mg via INTRAVENOUS

## 2019-01-03 MED ORDER — CEFAZOLIN SODIUM-DEXTROSE 2-4 GM/100ML-% IV SOLN
INTRAVENOUS | Status: AC
  Filled 2019-01-03: qty 100

## 2019-01-03 MED ORDER — HYDROCODONE-ACETAMINOPHEN 5-325 MG PO TABS: 1.0000 | ORAL_TABLET | ORAL | 0 refills | Status: DC | PRN

## 2019-01-03 MED ORDER — PHENYLEPHRINE 40 MCG/ML (10ML) SYRINGE FOR IV PUSH (FOR BLOOD PRESSURE SUPPORT)
PREFILLED_SYRINGE | INTRAVENOUS | Status: DC | PRN
  Administered 2019-01-03 (×3): 80 ug via INTRAVENOUS
  Administered 2019-01-03: 40 ug via INTRAVENOUS
  Administered 2019-01-03 (×2): 80 ug via INTRAVENOUS

## 2019-01-03 MED ORDER — PROPOFOL 10 MG/ML IV BOLUS
INTRAVENOUS | Status: DC | PRN
  Administered 2019-01-03: 150 ug via INTRAVENOUS

## 2019-01-03 MED ORDER — SODIUM CHLORIDE 0.9 % IR SOLN
Status: DC | PRN
  Administered 2019-01-03: 1000 mL

## 2019-01-03 MED ORDER — SUGAMMADEX SODIUM 200 MG/2ML IV SOLN
INTRAVENOUS | Status: DC | PRN
  Administered 2019-01-03: 150 mg via INTRAVENOUS

## 2019-01-03 MED ORDER — BUPIVACAINE HCL (PF) 0.25 % IJ SOLN
INTRAMUSCULAR | Status: DC | PRN
  Administered 2019-01-03: 10 mL

## 2019-01-03 MED ORDER — CEFAZOLIN SODIUM-DEXTROSE 2-4 GM/100ML-% IV SOLN
2.0000 g | INTRAVENOUS | Status: AC
  Administered 2019-01-03: 12:00:00 2 g via INTRAVENOUS

## 2019-01-03 MED ORDER — ONDANSETRON HCL 4 MG/2ML IJ SOLN
INTRAMUSCULAR | Status: AC
  Filled 2019-01-03: qty 2

## 2019-01-03 MED ORDER — DEXAMETHASONE SODIUM PHOSPHATE 10 MG/ML IJ SOLN
INTRAMUSCULAR | Status: AC
  Filled 2019-01-03: qty 1

## 2019-01-03 MED ORDER — PHENYLEPHRINE 40 MCG/ML (10ML) SYRINGE FOR IV PUSH (FOR BLOOD PRESSURE SUPPORT)
PREFILLED_SYRINGE | INTRAVENOUS | Status: AC
  Filled 2019-01-03: qty 10

## 2019-01-03 MED ORDER — LIDOCAINE 2% (20 MG/ML) 5 ML SYRINGE
INTRAMUSCULAR | Status: DC | PRN
  Administered 2019-01-03: 100 mg via INTRAVENOUS

## 2019-01-03 MED ORDER — LACTATED RINGERS IV SOLN
INTRAVENOUS | Status: DC
  Administered 2019-01-03: 11:00:00 via INTRAVENOUS

## 2019-01-03 MED ORDER — EPHEDRINE 5 MG/ML INJ
INTRAVENOUS | Status: AC
  Filled 2019-01-03: qty 10

## 2019-01-03 MED ORDER — ROCURONIUM BROMIDE 10 MG/ML (PF) SYRINGE
PREFILLED_SYRINGE | INTRAVENOUS | Status: DC | PRN
  Administered 2019-01-03: 30 mg via INTRAVENOUS

## 2019-01-03 MED ORDER — SUGAMMADEX SODIUM 200 MG/2ML IV SOLN
INTRAVENOUS | Status: AC
  Filled 2019-01-03: qty 2

## 2019-01-03 MED ORDER — BUPIVACAINE HCL (PF) 0.25 % IJ SOLN
INTRAMUSCULAR | Status: AC
  Filled 2019-01-03: qty 30

## 2019-01-03 MED ORDER — PROPOFOL 10 MG/ML IV BOLUS
INTRAVENOUS | Status: AC
  Filled 2019-01-03: qty 20

## 2019-01-03 MED ORDER — SUCCINYLCHOLINE CHLORIDE 200 MG/10ML IV SOSY
PREFILLED_SYRINGE | INTRAVENOUS | Status: DC | PRN
  Administered 2019-01-03: 100 mg via INTRAVENOUS

## 2019-01-03 MED ORDER — PROMETHAZINE HCL 25 MG/ML IJ SOLN: 6.2500 mg | INTRAMUSCULAR | Status: DC | PRN

## 2019-01-03 MED ORDER — DEXAMETHASONE SODIUM PHOSPHATE 10 MG/ML IJ SOLN
INTRAMUSCULAR | Status: DC | PRN
  Administered 2019-01-03: 4 mg via INTRAVENOUS

## 2019-01-03 MED ORDER — ONDANSETRON HCL 4 MG/2ML IJ SOLN
INTRAMUSCULAR | Status: DC | PRN
  Administered 2019-01-03: 4 mg via INTRAVENOUS

## 2019-01-03 MED ORDER — FENTANYL CITRATE (PF) 100 MCG/2ML IJ SOLN: 25.0000 ug | INTRAMUSCULAR | Status: DC | PRN

## 2019-01-03 MED ORDER — FENTANYL CITRATE (PF) 100 MCG/2ML IJ SOLN
INTRAMUSCULAR | Status: AC
  Filled 2019-01-03: qty 2

## 2019-01-03 MED ORDER — FENTANYL CITRATE (PF) 100 MCG/2ML IJ SOLN
INTRAMUSCULAR | Status: DC | PRN
  Administered 2019-01-03 (×2): 50 ug via INTRAVENOUS

## 2019-01-03 MED ORDER — OXYCODONE HCL 5 MG PO TABS: 5.0000 mg | ORAL_TABLET | Freq: Once | ORAL | Status: DC | PRN

## 2019-01-03 MED ORDER — OXYCODONE HCL 5 MG/5ML PO SOLN: 5.0000 mg | Freq: Once | ORAL | Status: DC | PRN

## 2019-01-03 SURGICAL SUPPLY — 29 items
ADH SKN CLS APL DERMABOND .7 (GAUZE/BANDAGES/DRESSINGS) ×1
BLADE CLIPPER SURG (BLADE) ×3 IMPLANT
BLADE HEX COATED 2.75 (ELECTRODE) ×3 IMPLANT
BNDG GAUZE ELAST 4 BULKY (GAUZE/BANDAGES/DRESSINGS) ×3 IMPLANT
COVER SURGICAL LIGHT HANDLE (MISCELLANEOUS) ×3 IMPLANT
COVER WAND RF STERILE (DRAPES) IMPLANT
DERMABOND ADVANCED (GAUZE/BANDAGES/DRESSINGS) ×2
DERMABOND ADVANCED .7 DNX12 (GAUZE/BANDAGES/DRESSINGS) ×1 IMPLANT
DRAIN PENROSE 18X1/4 LTX STRL (WOUND CARE) IMPLANT
DRAPE LAPAROTOMY T 98X78 PEDS (DRAPES) ×3 IMPLANT
ELECT REM PT RETURN 15FT ADLT (MISCELLANEOUS) ×3 IMPLANT
GLOVE BIO SURGEON STRL SZ7.5 (GLOVE) ×6 IMPLANT
GOWN STRL REUS W/TWL LRG LVL3 (GOWN DISPOSABLE) ×3 IMPLANT
KIT BASIN OR (CUSTOM PROCEDURE TRAY) ×3 IMPLANT
KIT TURNOVER KIT A (KITS) IMPLANT
NEEDLE HYPO 22GX1.5 SAFETY (NEEDLE) ×2 IMPLANT
NS IRRIG 1000ML POUR BTL (IV SOLUTION) ×3 IMPLANT
PACK GENERAL/GYN (CUSTOM PROCEDURE TRAY) ×3 IMPLANT
SOL PREP PROV IODINE SCRUB 4OZ (MISCELLANEOUS) ×3 IMPLANT
SUPPORT SCROTAL LG STRP (MISCELLANEOUS) ×2 IMPLANT
SUPPORTER ATHLETIC LG (MISCELLANEOUS) ×1
SUT CHROMIC 3 0 SH 27 (SUTURE) ×6 IMPLANT
SUT VIC AB 2-0 UR5 27 (SUTURE) IMPLANT
SUT VICRYL 0 TIES 12 18 (SUTURE) IMPLANT
SYR CONTROL 10ML LL (SYRINGE) ×2 IMPLANT
TOWEL OR 17X26 10 PK STRL BLUE (TOWEL DISPOSABLE) ×6 IMPLANT
TRAY CATH 16FR W/PLASTIC CATH (SET/KITS/TRAYS/PACK) ×2 IMPLANT
WATER STERILE IRR 1000ML POUR (IV SOLUTION) ×3 IMPLANT
urethral catheterization tray ×2 IMPLANT

## 2019-01-03 NOTE — Discharge Instructions (Signed)
Discharge instructions following scrotal surgery  Call your doctor for: Fever is greater than 100.5 Severe nausea or vomiting Increasing pain not controlled by pain medication Increasing redness or drainage from incisions  The number for questions or concerns is [phone]  Activity level: No lifting greater than 20 pounds (about equal to milk) for the next 2 weeks or until cleared to do so at follow-up appointment.  Otherwise activity as tolerated by comfort level.  Diet: May resume your regular diet as tolerated  Driving: No driving while still taking opiate pain medications (weight at least 6-8 hours after last dose).  No driving if you still sore from surgery as it may limit her ability to react quickly if necessary.   Shower/bath: May shower and get incision wet pad dry immediately following.  Do not scrub vigorously for the next 2-3 weeks.  Do not soak incision (ID soaking in bath or swimming) until told he may do so by Dr., as this may promote a wound infection.  Wound care: He may cover wounds with sterile gauze as needed to prevent incisions rubbing on close follow-up in any seepage.  Where tight fitting underpants/scrotal support for at least 2 weeks.  He should apply cold compresses (ice or sac of frozen peas/corn) to your scrotum for at least 48 hours to reduce the swelling for 15 minutes at a time indirectly.  You should expect that his scrotum will swell up initially and then get smaller over the next 2-4 weeks.  Follow-up appointments: Follow-up appointment will be scheduled with Dr. [name] for a wound check.  

## 2019-01-03 NOTE — Transfer of Care (Signed)
Immediate Anesthesia Transfer of Care Note  Patient: Rick [name]  Procedure(s) Performed: RIGHT EPIDIDYMAL CYST REMOVAL VERSES EPIDIDMECTOMY (N/A )  Patient Location: PACU  Anesthesia Type:General  Level of Consciousness: awake, alert  and oriented  Airway & Oxygen Therapy: Patient Spontanous Breathing and Patient connected to face mask oxygen  Post-op Assessment: Report given to RN and Post -op Vital signs reviewed and stable  Post vital signs: Reviewed and stable  Last Vitals:  Vitals Value Taken Time  BP    Temp    Pulse 92 01/03/2019 12:48 PM  Resp 16 01/03/2019 12:48 PM  SpO2 100 % 01/03/2019 12:48 PM  Vitals shown include unvalidated device data.  Last Pain:  Vitals:   01/03/19 1105  TempSrc:   PainSc: 0-No pain         Complications: No apparent anesthesia complications

## 2019-01-03 NOTE — Anesthesia Procedure Notes (Signed)
Procedure Name: Intubation Date/Time: 01/03/2019 11:53 AM Performed by: Eben Burow, CRNA Pre-anesthesia Checklist: Patient identified, Emergency Drugs available, Suction available, Patient being monitored and Timeout performed Patient Re-evaluated:Patient Re-evaluated prior to induction Oxygen Delivery Method: Circle system utilized Preoxygenation: Pre-oxygenation with 100% oxygen Induction Type: IV induction and Rapid sequence Laryngoscope Size: Mac and 4 Grade View: Grade I Tube type: Oral Tube size: 7.5 mm Number of attempts: 1 Airway Equipment and Method: Stylet Placement Confirmation: ETT inserted through vocal cords under direct vision,  positive ETCO2 and breath sounds checked- equal and bilateral Secured at: 23 cm Tube secured with: Tape Dental Injury: Teeth and Oropharynx as per pre-operative assessment

## 2019-01-03 NOTE — Op Note (Signed)
.  Operative Note  Preoperative diagnosis:  1.  Right epididymal cyst  Postoperative diagnosis: 1.  Right epididymal cyst 2.  Right hydrocele  Procedure(s): 1.  Right epididymal cyst removal 2.  Right hydrocelectomy  Surgeon: Link Snuffer, MD  Assistants: none  Anesthesia: Gen.  Complications: none immediate  EBL: 20 mL  Specimens: 1.  Right epididymal cyst  Drains/Catheters: 1. none  Condition following The operation: stable  Intraoperative findings: Small right hydrocele.  2 cm firm right epididymal head cyst  Indication: the patient is a 78 year old man with scrotal swelling found to have a right epididymal cyst that was painful and he desired excision.  Description of procedure:  The patient was identified and consent was obtained. He was taken back to the operating room and placed in the supine position. He was placed under general anesthesia and prepped and draped in the standard sterile fashion.Perioperative antibiotic was given. Timeout was performed. A 4 cm incision was made along the median raphae. This was carried down through the dartos and the testicle along with the hydrocele sac was delivered onto the operative field. The gubernacular attachments were released with a combination of blunt dissection and electrocautery. Spot electrocautery was used for hemostasis as necessary. Care was taken to not use electrocautery close to the cord itself. The hydrocele sac was incised and fluid was evacuated. I extended the hydrocele sac incision proximally and distally.  Hemostasis was obtained carefully with Bovie electrocautery.  I then turned my attention to the epididymis.  This was carefully excised with Bovie electrocautery.  There was a defect in the tunica and therefore this was closed with a running 3-0 chromic.  There was good hemostasis.  The testicle was delivered back into the scrotum in its proper anatomical position. The dartos was closed with a running 3-0 chromic.  The skin was closed with running 3-0 chromic sutures. 10 mL quarter percent Marcaine was instilled for anesthetic affect. Dermabond was applied and then a dressing along with scrotal support was applied. This concluded the operation. The patient tolerated the procedure well and was stable postoperatively.  Plan: The patient will return in several weeks for postoperative wound check.

## 2019-01-03 NOTE — Progress Notes (Signed)
Called the patient's pharmacy in [place], [place] and they are going to deliver his pain medication to his home as prescribed by Dr. Gloriann Loan.

## 2019-01-03 NOTE — H&P (Signed)
CC/HPI: CC: Epididymal cyst   HPI: Patient with history of BP. He underwent TURP on 03/22/18. Pathology showed benign prostate tissue with foci of inflammation. 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 CT IVP revealed enhancement at the distal ureter concerning for possible malignancy. He also underwent left retrograde and diagnostic ureteroscopy at this time. Per operative note, there were no bladder tumors or stones noted. Retrograde pyelogram revealed only mild hydronephrosis down to the level of the bladder. Diagnostic URS revealed no evidence of tumor along the course of the entire ureter up to the renal pelvis. There were no filling defects in the kidney to suggest tumor. He returns today for follow up. He states that he feels he has been doing well since his procedure and is pleased with results. He continues to have dysuria and urgency, but states that these seems to be improved from prior. He feels that his stream is fair. He denies straining or intermittency. He denies incontinence, fever, chills, nausea, or vomiting. He did have several questions regarding ED today, which were only briefly addressed.   10/17/2018  Patient has been doing well in regards to his prostate after TURP. He feels like he is voiding well. He denies urinary tract infections. He does however complain about a 3 month onset of testicular swelling on the right. He also has associated 6 out of 10 pain there. He wants to be evaluated for this. He did not leave a urine sample   11/12/2018  Patient returns after undergoing a scrotal ultrasound. This revealed a right epididymal cyst. There is no evidence of epididymal orchitis. There was no evidence of solid mass. Testicles appeared normal. He continues to have significant pain around the area of the right epididymal cyst. Ultrasound only showed a small epididymal cyst but on exam it feels like he has a 2-3 cm firm area in the tail of the  epididymis.     ALLERGIES: Haloperidol TABS sulfa    MEDICATIONS: Finasteride 5 mg tablet  Hydrochlorothiazide 25 mg tablet  Metformin Hcl 1,000 mg tablet  Enalapril Maleate 10 mg tablet  Fluoxetine Hcl 20 mg capsule  Fluticasone Propionate 50 mcg/actuation spray, suspension  Gabapentin 300 mg capsule  Glimepiride 4 mg tablet  Glipizide 10 mg tablet  Lantus Solostar 100 unit/ml (3 ml) insulin pen  Lovastatin 40 mg tablet  Mirtazapine 15 mg tablet  Oxycodone Hcl 5 mg tablet  Primidone 50 mg tablet  Sildenafil Citrate 20 mg tablet  Trazodone Hcl 100 mg tablet  Valacyclovir 1,000 mg tablet     GU PSH: Complex cystometrogram, w/ void pressure and urethral pressure profile studies, any technique - 02/20/2018 Complex Uroflow - 02/20/2018 Cystoscopy - 03/07/2018 Cystoscopy TURP - 03/22/2018 Cystoscopy Ureteroscopy - 03/22/2018 Emg surf Electrd - 02/20/2018 Inject For cystogram - 02/20/2018 Intrabd voidng Press - 02/20/2018 Locm 300-399Mg /Ml Iodine,1Ml - 02/25/2018    NON-GU PSH: Ankle Arthroscopy/surgery, Left Appendectomy (laparoscopic) Cholecystectomy (laparoscopic) Colostomy Hernia Repair Inner Ear Surgery Procedure        GU PMH: Benign epididymal mass - 10/17/2018 BPH w/LUTS - 03/07/2018 Incomplete bladder emptying (Stable) - 03/07/2018, - 02/20/2018 Nocturia - 03/07/2018 Weak Urinary Stream - 03/07/2018 Chronic cystitis (w/o hematuria) - 01/24/2018 Bladder Cancer, Unspec Ureteral Cancer, Unspec    NON-GU PMH: Bipolar disorder, unspecified Depression Diabetes Type 2 Herpesviral infection of penis Hypercholesterolemia Hypertension Parkinson''s disease Peripheral vascular disease Skin Cancer, History    FAMILY HISTORY: Death of family member - Father, Mother  Notes: 1 son; 1 daughter   SOCIAL HISTORY: Marital Status: Widowed Preferred Language: English; Ethnicity: Not Hispanic Or Latino; Race: White Current Smoking Status: Patient does not smoke anymore. Has not smoked  since 01/12/2010.  <DIV'  Tobacco Use Assessment Completed:  Used Tobacco in last 30 days?   Does not use smokeless tobacco. Does not drink anymore.  Drinks 1 caffeinated drink per day.    REVIEW OF SYSTEMS:     GU Review Male:  Patient denies frequent urination, hard to postpone urination, burning/ pain with urination, get up at night to urinate, leakage of urine, stream starts and stops, trouble starting your stream, have to strain to urinate , erection problems, and penile pain.    Gastrointestinal (Upper):  Patient denies nausea, vomiting, and indigestion/ heartburn.    Gastrointestinal (Lower):  Patient denies diarrhea and constipation.    Constitutional:  Patient denies fever, night sweats, weight loss, and fatigue.    Skin:  Patient denies skin rash/ lesion and itching.    Eyes:  Patient denies blurred vision and double vision.    Ears/ Nose/ Throat:  Patient denies sore throat and sinus problems.    Hematologic/Lymphatic:  Patient denies swollen glands and easy bruising.    Cardiovascular:  Patient denies leg swelling and chest pains.    Respiratory:  Patient denies cough and shortness of breath.    Endocrine:  Patient denies excessive thirst.    Musculoskeletal:  Patient denies back pain and joint pain.    Neurological:  Patient denies headaches and dizziness.    Psychologic:  Patient denies anxiety and depression.    VITAL SIGNS:       11/12/2018 10:40 AM     BP 150/78 mmHg     Heart Rate 68 /min     Temperature 97.6 F / 36.4 C     GU PHYSICAL EXAMINATION:      Testes: Bilateral testicles descended without masses. There is a firm cystic mass on the inferior portion of the right testicle feels like an epididymal cyst. It is slightly tender to palpation in about 3 cm in size.     MULTI-SYSTEM PHYSICAL EXAMINATION:      Constitutional: Poor grooming. Well-nourished. No physical deformities. Normally developed.      Respiratory: No labored breathing, no use of accessory muscles      Cardiovascular: Normal temperature, adequate perfusion of extremities     Skin: No paleness, no jaundice     Neurologic / Psychiatric: Oriented to time, oriented to place, oriented to person. No depression, no anxiety, no agitation.     Gastrointestinal: No mass, no tenderness, no rigidity, non obese abdomen.     Eyes: Normal conjunctivae. Normal eyelids.     Musculoskeletal: Normal gait and station of head and neck.            PAST DATA REVIEWED:   Source Of History:  Patient  X-Ray Review: Scrotal Ultrasound: Reviewed Films. Reviewed Report. Discussed With Patient.      01/24/18  Hormones  Testosterone, Total 309.9 ng/dL    PROCEDURES: None   ASSESSMENT:     ICD-10 Details  1 GU:  Benign epididymal mass - D29.30   2  Right testicular pain - N50.811    PLAN:   Document  Letter(s):  Created for Patient: Clinical Summary   Notes:  The patient greatly desired surgical intervention. I discussed the option of epididymectomy versus epididymal cyst removal. He wishes to proceed. He understands the potential for bleeding,  infection, injury to surrounding structures including the possibility of testicular injury and loss, potential need for additional procedures, and the potential that this does not improve and can also potentially worsen pain.   Signed by Link Snuffer, III, M.D. on 11/12/18 at 11:13 AM (EDT

## 2019-01-03 NOTE — Anesthesia Postprocedure Evaluation (Signed)
Anesthesia Post Note  Patient: Rick [name]  Procedure(s) Performed: RIGHT EPIDIDYMAL CYST REMOVAL VERSES EPIDIDMECTOMY (N/A )     Patient location during evaluation: PACU Anesthesia Type: General Level of consciousness: awake and alert Pain management: pain level controlled Vital Signs Assessment: post-procedure vital signs reviewed and stable Respiratory status: spontaneous breathing, nonlabored ventilation, respiratory function stable and patient connected to nasal cannula oxygen Cardiovascular status: blood pressure returned to baseline and stable Postop Assessment: no apparent nausea or vomiting Anesthetic complications: no    Last Vitals:  Vitals:   01/03/19 1315 01/03/19 1342  BP:  120/72  Pulse:  85  Resp:  15  Temp: 36.9 C 36.9 C  SpO2:  98%    Last Pain:  Vitals:   01/03/19 1105  TempSrc:   PainSc: 0-No pain                 Willett Lefeber S

## 2019-01-08 ENCOUNTER — Encounter (HOSPITAL_COMMUNITY): Payer: Self-pay | Admitting: Urology

## 2019-01-08 ENCOUNTER — Ambulatory Visit: Payer: Medicare Other | Admitting: Urology

## 2019-01-14 DIAGNOSIS — H40003 Preglaucoma, unspecified, bilateral: Secondary | ICD-10-CM | POA: Diagnosis not present

## 2019-01-14 LAB — HM DIABETES EYE EXAM

## 2019-01-15 ENCOUNTER — Other Ambulatory Visit: Payer: Medicare Other

## 2019-01-15 ENCOUNTER — Other Ambulatory Visit: Payer: Self-pay

## 2019-01-15 DIAGNOSIS — N390 Urinary tract infection, site not specified: Secondary | ICD-10-CM

## 2019-01-17 ENCOUNTER — Telehealth: Payer: Self-pay | Admitting: Family Medicine

## 2019-01-17 DIAGNOSIS — R300 Dysuria: Secondary | ICD-10-CM | POA: Diagnosis not present

## 2019-01-17 DIAGNOSIS — N3000 Acute cystitis without hematuria: Secondary | ICD-10-CM | POA: Diagnosis not present

## 2019-01-17 DIAGNOSIS — R8279 Other abnormal findings on microbiological examination of urine: Secondary | ICD-10-CM | POA: Diagnosis not present

## 2019-01-17 MED ORDER — AMOXICILLIN 875 MG PO TABS: 875.0000 mg | ORAL_TABLET | Freq: Two times a day (BID) | ORAL | 0 refills | Status: DC

## 2019-01-17 NOTE — Telephone Encounter (Signed)
Patient notified and verbalized understanding. 

## 2019-01-17 NOTE — Telephone Encounter (Signed)
Please let him know that his urine grew out another infection. Please send copy of results to urology (Dr. Gloriann Loan) and have him follow up with his urologist. Rene Paci sent an antibiotic to his pharmacy.

## 2019-01-18 LAB — UA/M W/RFLX CULTURE, ROUTINE
Bilirubin, UA: NEGATIVE
Glucose, UA: NEGATIVE
Ketones, UA: NEGATIVE
Nitrite, UA: POSITIVE — AB
Protein,UA: NEGATIVE
Specific Gravity, UA: 1.015 (ref 1.005–1.030)
Urobilinogen, Ur: 0.2 mg/dL (ref 0.2–1.0)
pH, UA: 5 (ref 5.0–7.5)

## 2019-01-18 LAB — MICROSCOPIC EXAMINATION: WBC, UA: >30 /HPF — AB (ref 0–5)

## 2019-01-18 LAB — URINE CULTURE, REFLEX

## 2019-01-31 DIAGNOSIS — D225 Melanocytic nevi of trunk: Secondary | ICD-10-CM | POA: Diagnosis not present

## 2019-01-31 DIAGNOSIS — Z1283 Encounter for screening for malignant neoplasm of skin: Secondary | ICD-10-CM | POA: Diagnosis not present

## 2019-01-31 DIAGNOSIS — D2230 Melanocytic nevi of unspecified part of face: Secondary | ICD-10-CM | POA: Diagnosis not present

## 2019-01-31 DIAGNOSIS — D229 Melanocytic nevi, unspecified: Secondary | ICD-10-CM | POA: Diagnosis not present

## 2019-01-31 DIAGNOSIS — L821 Other seborrheic keratosis: Secondary | ICD-10-CM | POA: Diagnosis not present

## 2019-01-31 DIAGNOSIS — D485 Neoplasm of uncertain behavior of skin: Secondary | ICD-10-CM | POA: Diagnosis not present

## 2019-01-31 DIAGNOSIS — Z85828 Personal history of other malignant neoplasm of skin: Secondary | ICD-10-CM | POA: Diagnosis not present

## 2019-02-03 DIAGNOSIS — N3000 Acute cystitis without hematuria: Secondary | ICD-10-CM | POA: Diagnosis not present

## 2019-02-03 DIAGNOSIS — R8271 Bacteriuria: Secondary | ICD-10-CM | POA: Diagnosis not present

## 2019-02-11 DIAGNOSIS — R251 Tremor, unspecified: Secondary | ICD-10-CM | POA: Diagnosis not present

## 2019-02-20 DIAGNOSIS — N3000 Acute cystitis without hematuria: Secondary | ICD-10-CM | POA: Diagnosis not present

## 2019-03-12 ENCOUNTER — Telehealth: Payer: Self-pay | Admitting: Family Medicine

## 2019-03-12 NOTE — Telephone Encounter (Signed)
Pt called in wanting to come in to provide urine sample stating that he thinks that he has an uti. He says that he went to urologist 3 weeks ago and it was negative. He is wanting to just come in and drop sample off and be called with results. He wanted me to let provider know that it is too dangerous for him to keep going to [facility]. Tried to get him scheduled for appt, he just wants to drop it off. Please advise.

## 2019-03-12 NOTE — Telephone Encounter (Signed)
Needs appointment. Can be virtual. But cannot just drop off a urine.

## 2019-03-12 NOTE — Telephone Encounter (Signed)
Patient states that he will see how he is feeling by Friday, he will increase his fluid intake to see if that will help, if he is not better he will call to schedule an appointment.

## 2019-03-18 ENCOUNTER — Ambulatory Visit: Payer: Medicare Other | Admitting: Family Medicine

## 2019-03-20 ENCOUNTER — Encounter: Payer: Self-pay | Admitting: Family Medicine

## 2019-03-20 ENCOUNTER — Other Ambulatory Visit: Payer: Self-pay

## 2019-03-20 ENCOUNTER — Ambulatory Visit (INDEPENDENT_AMBULATORY_CARE_PROVIDER_SITE_OTHER): Payer: Medicare Other | Admitting: Family Medicine

## 2019-03-20 ENCOUNTER — Other Ambulatory Visit: Payer: Medicare Other

## 2019-03-20 VITALS — Ht 66.0 in

## 2019-03-20 DIAGNOSIS — N411 Chronic prostatitis: Secondary | ICD-10-CM

## 2019-03-20 DIAGNOSIS — Z8551 Personal history of malignant neoplasm of bladder: Secondary | ICD-10-CM

## 2019-03-20 DIAGNOSIS — N3001 Acute cystitis with hematuria: Secondary | ICD-10-CM | POA: Diagnosis not present

## 2019-03-20 DIAGNOSIS — A6001 Herpesviral infection of penis: Secondary | ICD-10-CM

## 2019-03-20 DIAGNOSIS — N200 Calculus of kidney: Secondary | ICD-10-CM

## 2019-03-20 DIAGNOSIS — N529 Male erectile dysfunction, unspecified: Secondary | ICD-10-CM

## 2019-03-20 DIAGNOSIS — R300 Dysuria: Secondary | ICD-10-CM | POA: Diagnosis not present

## 2019-03-20 DIAGNOSIS — N401 Enlarged prostate with lower urinary tract symptoms: Secondary | ICD-10-CM

## 2019-03-20 DIAGNOSIS — Z8554 Personal history of malignant neoplasm of ureter: Secondary | ICD-10-CM

## 2019-03-20 DIAGNOSIS — R3914 Feeling of incomplete bladder emptying: Secondary | ICD-10-CM

## 2019-03-20 DIAGNOSIS — N400 Enlarged prostate without lower urinary tract symptoms: Secondary | ICD-10-CM | POA: Diagnosis not present

## 2019-03-20 DIAGNOSIS — N390 Urinary tract infection, site not specified: Secondary | ICD-10-CM

## 2019-03-20 MED ORDER — AMOXICILLIN 875 MG PO TABS: 875.0000 mg | ORAL_TABLET | Freq: Two times a day (BID) | ORAL | 0 refills | Status: DC

## 2019-03-20 NOTE — Progress Notes (Signed)
Ht 5\' 6"  (1.676 m)   BMI 27.37 kg/m    Subjective:    Patient ID: Rick [name], male    DOB: August 31, 1940, 78 y.o.   MRN: [id number]  HPI: [name] is a 78 y.o. male  Chief Complaint  Patient presents with  . Urinary Frequency    x about a week ago  . Dysuria   URINARY SYMPTOMS- saw his urologist about 2 weeks ago and was given a shot of penicillin- no notes available at this time.  Duration: 1 week Dysuria: burning Urinary frequency: yes Urgency: yes Small volume voids: unsure Symptom severity: moderate Urinary incontinence: yes Foul odor: no Hematuria: no Abdominal pain: no Back pain: no Suprapubic pain/pressure: no Flank pain: no Fever:  no Vomiting: no Relief with cranberry juice: no Relief with pyridium: no Status: stable Previous urinary tract infection: yes Recurrent urinary tract infection: yes History of sexually transmitted disease: no Penile discharge: yes Treatments attempted: antibiotics and increasing fluids   Relevant past medical, surgical, family and social history reviewed and updated as indicated. Interim medical history since our last visit reviewed. Allergies and medications reviewed and updated.  Review of Systems  Constitutional: Negative.   Respiratory: Negative.   Cardiovascular: Negative.   Gastrointestinal: Negative.   Genitourinary: Positive for dysuria, frequency and urgency. Negative for decreased urine volume, difficulty urinating, discharge, enuresis, flank pain, genital sores, hematuria, penile pain, penile swelling, scrotal swelling and testicular pain.  Psychiatric/Behavioral: Negative.     Per HPI unless specifically indicated above     Objective:    Ht 5\' 6"  (1.676 m)   BMI 27.37 kg/m   Wt Readings from Last 3 Encounters:  01/03/19 169 lb 9 oz (76.9 kg)  12/31/18 169 lb 9 oz (76.9 kg)  10/24/18 165 lb 1 oz (74.9 kg)    Physical Exam Vitals signs and nursing note reviewed.  Pulmonary:     Effort:  Pulmonary effort is normal. No respiratory distress.     Comments: Speaking in full sentences Neurological:     Mental Status: He is alert.  Psychiatric:        Mood and Affect: Mood normal.        Behavior: Behavior normal.        Thought Content: Thought content normal.        Judgment: Judgment normal.     Comments: Very concerned about the fact that he has been discharged from [facility]     Results for orders placed or performed in visit on 03/20/19  Microscopic Examination   URINE  Result Value Ref Range   WBC, UA >30 (A) 0 - 5 /hpf   RBC >30 (A) 0 - 2 /hpf   Epithelial Cells (non renal) 0-10 0 - 10 /hpf   Crystals Present N/A   Crystal Type Calcium Oxalate N/A   Mucus, UA Present Not Estab.   Bacteria, UA Few (A) None seen/Few  Urine Culture, Reflex   URINE  Result Value Ref Range   Urine Culture, Routine WILL FOLLOW   UA/M w/rflx Culture, Routine   Specimen: Urine   URINE  Result Value Ref Range   Specific Gravity, UA 1.010 1.005 - 1.030   pH, UA 5.0 5.0 - 7.5   Color, UA Yellow Yellow   Appearance Ur Cloudy (A) Clear   Leukocytes,UA 3+ (A) Negative   Protein,UA 2+ (A) Negative/Trace   Glucose, UA Negative Negative   Ketones, UA Negative Negative   RBC, UA 3+ (  A) Negative   Bilirubin, UA Negative Negative   Urobilinogen, Ur 0.2 0.2 - 1.0 mg/dL   Nitrite, UA Negative Negative   Microscopic Examination See below:    Urinalysis Reflex Comment       Assessment & Plan:   Problem List Items Addressed This Visit      Genitourinary   Benign fibroma of prostate   Relevant Orders   Ambulatory referral to Urology   Chronic prostatitis   Relevant Orders   Ambulatory referral to Urology   Herpesviral infection of penis   Relevant Orders   Ambulatory referral to Urology   ED (erectile dysfunction) of organic origin   Relevant Orders   Ambulatory referral to Urology   Calculus of kidney   Relevant Orders   Ambulatory referral to Urology    Recurrent UTI   Relevant Orders   Ambulatory referral to Urology   BPH (benign prostatic hyperplasia)   Relevant Orders   Ambulatory referral to Urology     Other   History of bladder cancer   Relevant Orders   Ambulatory referral to Urology   History of cancer of ureter   Relevant Orders   Ambulatory referral to Urology    Other Visit Diagnoses    Acute cystitis with hematuria    -  Primary   Will treat with amoxicillin. Needs to follow up with urology- does not want to drive to [place], cannot be seen at [facility]. Will refer to [facility] urology.   Relevant Orders   Ambulatory referral to Urology   Dysuria       Relevant Orders   Ambulatory referral to Urology       Follow up plan: Return ASAP DM visit.    . This visit was completed via telephone due to the restrictions of the COVID-19 pandemic. All issues as above were discussed and addressed but no physical exam was performed. If it was felt that the patient should be evaluated in the office, they were directed there. The patient verbally consented to this visit. Patient was unable to complete an audio/visual visit due to Lack of equipment. Due to the catastrophic nature of the COVID-19 pandemic, this visit was done through audio contact only. . Location of the patient: home . Location of the provider: work . Those involved with this call:  . Provider: Park Liter, DO . CMA: Lesle Chris, [place] . Front Desk/Registration: Don Perking  . Time spent on call: 25 minutes on the phone discussing health concerns, >50% in counseling and coordination of care. 40 minutes total spent in review of patient's record and preparation of their chart.

## 2019-03-22 LAB — MICROSCOPIC EXAMINATION
RBC, Urine: >30 /hpf — AB (ref 0–2)
WBC, UA: >30 /hpf — AB (ref 0–5)

## 2019-03-22 LAB — UA/M W/RFLX CULTURE, ROUTINE
Bilirubin, UA: NEGATIVE
Glucose, UA: NEGATIVE
Ketones, UA: NEGATIVE
Nitrite, UA: NEGATIVE
Specific Gravity, UA: 1.01 (ref 1.005–1.030)
Urobilinogen, Ur: 0.2 mg/dL (ref 0.2–1.0)
pH, UA: 5 (ref 5.0–7.5)

## 2019-03-22 LAB — URINE CULTURE, REFLEX

## 2019-03-26 ENCOUNTER — Telehealth: Payer: Self-pay | Admitting: Family Medicine

## 2019-03-26 NOTE — Telephone Encounter (Signed)
Pt called stating he is needing his lab results. Please advise.

## 2019-03-26 NOTE — Telephone Encounter (Signed)
Patient notified and verbalized understanding. 

## 2019-03-26 NOTE — Telephone Encounter (Signed)
Patient called and would like a call back from [place] regarding his labs and still having issues with urination. He is still in pain and the antibiotic he almost finished and its not helping with no improvement. Please return patients call back, thanks.

## 2019-03-26 NOTE — Telephone Encounter (Signed)
Pt states he needs a call back from [place] between 8-8:45 am.

## 2019-03-26 NOTE — Telephone Encounter (Signed)
Please let him know his urine culture did not grow out a significant amount of abnormal bacteria. If he's still having issues he should follow up with his Urologist for further testing

## 2019-04-04 ENCOUNTER — Telehealth: Payer: Self-pay

## 2019-04-04 NOTE — Telephone Encounter (Signed)
Needs to be seen- OK to take ibuprofen and flonase until I see him

## 2019-04-04 NOTE — Telephone Encounter (Signed)
Patient notified, appointment scheduled

## 2019-04-04 NOTE — Telephone Encounter (Signed)
Called and spoke with patient, he states that he has been experiencing a chronic ear not, its not horrible but it is there. He went to go see ENT but his car broke down, he has the appt rescheduled for 04/22/19.  He is concerned that this may damage his ear that is still good.  He thinks that he may have some fluid in his ear.  He would like a refill on Neomycin- Polymyxin- hydrocortisone.  Copied from [place] [phone]. Topic: General - Other >> Apr 04, 2019 11:20 AM Sheran Luz wrote: Patient requesting call back from [place] to discuss some reoccurring ear pain.

## 2019-04-10 ENCOUNTER — Encounter: Payer: Self-pay | Admitting: Family Medicine

## 2019-04-10 ENCOUNTER — Ambulatory Visit (INDEPENDENT_AMBULATORY_CARE_PROVIDER_SITE_OTHER): Payer: Medicare Other | Admitting: Family Medicine

## 2019-04-10 ENCOUNTER — Other Ambulatory Visit: Payer: Self-pay

## 2019-04-10 VITALS — BP 103/62 | HR 78 | Temp 98.4°F

## 2019-04-10 DIAGNOSIS — E782 Mixed hyperlipidemia: Secondary | ICD-10-CM | POA: Diagnosis not present

## 2019-04-10 DIAGNOSIS — E1142 Type 2 diabetes mellitus with diabetic polyneuropathy: Secondary | ICD-10-CM

## 2019-04-10 DIAGNOSIS — R8281 Pyuria: Secondary | ICD-10-CM | POA: Diagnosis not present

## 2019-04-10 DIAGNOSIS — N390 Urinary tract infection, site not specified: Secondary | ICD-10-CM

## 2019-04-10 DIAGNOSIS — R8271 Bacteriuria: Secondary | ICD-10-CM | POA: Diagnosis not present

## 2019-04-10 DIAGNOSIS — I10 Essential (primary) hypertension: Secondary | ICD-10-CM

## 2019-04-10 DIAGNOSIS — E785 Hyperlipidemia, unspecified: Secondary | ICD-10-CM

## 2019-04-10 DIAGNOSIS — H9201 Otalgia, right ear: Secondary | ICD-10-CM

## 2019-04-10 MED ORDER — ENALAPRIL MALEATE 10 MG PO TABS: 10.0000 mg | ORAL_TABLET | Freq: Every day | ORAL | 1 refills | Status: DC

## 2019-04-10 MED ORDER — HYDROCHLOROTHIAZIDE 25 MG PO TABS: ORAL_TABLET | ORAL | 1 refills | Status: DC

## 2019-04-10 MED ORDER — GLIPIZIDE ER 2.5 MG PO TB24: 2.5000 mg | ORAL_TABLET | Freq: Every day | ORAL | 1 refills | Status: DC

## 2019-04-10 MED ORDER — CARVEDILOL 6.25 MG PO TABS: 6.2500 mg | ORAL_TABLET | Freq: Two times a day (BID) | ORAL | 1 refills | Status: DC

## 2019-04-10 MED ORDER — TRAZODONE HCL 100 MG PO TABS: 100.0000 mg | ORAL_TABLET | Freq: Every day | ORAL | 1 refills | Status: DC

## 2019-04-10 MED ORDER — MIRTAZAPINE 15 MG PO TABS: 15.0000 mg | ORAL_TABLET | Freq: Every day | ORAL | 1 refills | Status: DC

## 2019-04-10 MED ORDER — METFORMIN HCL 1000 MG PO TABS: ORAL_TABLET | ORAL | 1 refills | Status: DC

## 2019-04-10 MED ORDER — LOVASTATIN 40 MG PO TABS: 40.0000 mg | ORAL_TABLET | Freq: Every day | ORAL | 1 refills | Status: DC

## 2019-04-10 MED ORDER — VALACYCLOVIR HCL 1 G PO TABS: 1000.0000 mg | ORAL_TABLET | Freq: Every day | ORAL | 1 refills | Status: DC | PRN

## 2019-04-10 MED ORDER — LANTUS SOLOSTAR 100 UNIT/ML ~~LOC~~ SOPN: 15.0000 [IU] | PEN_INJECTOR | Freq: Every day | SUBCUTANEOUS | 1 refills | Status: DC

## 2019-04-10 MED ORDER — GABAPENTIN 300 MG PO CAPS: 600.0000 mg | ORAL_CAPSULE | Freq: Two times a day (BID) | ORAL | 1 refills | Status: DC

## 2019-04-10 NOTE — Assessment & Plan Note (Signed)
Under good control on current regimen. Continue current regimen. Continue to monitor. Call with any concerns. Refills given. Labs drawn today.   

## 2019-04-10 NOTE — Assessment & Plan Note (Signed)
+  WBC today- no symptoms. Await culture and treat if needed. Follow up with urology as needed.

## 2019-04-10 NOTE — Progress Notes (Signed)
BP 103/62   Pulse 78   Temp 98.4 F (36.9 C)   SpO2 97%    Subjective:    Patient ID: Rick [name], male    DOB: 11/08/1940, 78 y.o.   MRN: [id number]  HPI: Rick [name] is a 78 y.o. male  Chief Complaint  Patient presents with  . Migraine  . Ear Pain  . Diabetes  . Urinary Tract Infection   Woke up this AM with a bad headache- its starting to get better. He thinks that it's due to the heat. No changes in his vision. No numbness or tingling or weakness.   DIABETES Hypoglycemic episodes:no Polydipsia/polyuria: no Visual disturbance: no Chest pain: no Paresthesias: no Glucose Monitoring: yes  Accucheck frequency: Daily  Fasting glucose: 130s Taking Insulin?: yes Blood Pressure Monitoring: not checking Retinal Examination: Up to Date Foot Exam: Up to Date Diabetic Education: Completed Pneumovax: Up to Date Influenza: Up to Date Aspirin: yes  HYPERTENSION / HYPERLIPIDEMIA Satisfied with current treatment? yes Duration of hypertension: chronic BP monitoring frequency: not checking BP medication side effects: no Past BP meds: enlalapril, carvedilol Duration of hyperlipidemia: chronic Cholesterol medication side effects: no Cholesterol supplements: none Past cholesterol medications: lovastatin Medication compliance: excellent compliance Aspirin: no Recent stressors: no Recurrent headaches: no Visual changes: no Palpitations: no Dyspnea: no Chest pain: no Lower extremity edema: no Dizzy/lightheaded: no  EAR PAIN- has been using his flonase and it seems like it has been helping. He is seeing Dr. Richardson Landry Duration: chronic Involved ear(s): right Severity:  Mild Quality:  aching Fever: no Otorrhea: no Upper respiratory infection symptoms: no Pruritus: no Hearing loss: yes Water immersion no Using Q-tips: no Recurrent otitis media: no Status: stable Treatments attempted: flonase   Relevant past medical, surgical, family and social history  reviewed and updated as indicated. Interim medical history since our last visit reviewed. Allergies and medications reviewed and updated.  Review of Systems  Constitutional: Negative.   HENT: Positive for ear pain. Negative for congestion, dental problem, drooling, ear discharge, facial swelling, hearing loss, mouth sores, nosebleeds, postnasal drip, rhinorrhea, sinus pressure, sinus pain, sneezing, sore throat, tinnitus, trouble swallowing and voice change.   Respiratory: Negative.   Cardiovascular: Negative.   Neurological: Positive for headaches. Negative for dizziness, tremors, seizures, syncope, facial asymmetry, speech difficulty, weakness, light-headedness and numbness.  Psychiatric/Behavioral: Negative.     Per HPI unless specifically indicated above     Objective:    BP 103/62   Pulse 78   Temp 98.4 F (36.9 C)   SpO2 97%   Wt Readings from Last 3 Encounters:  01/03/19 169 lb 9 oz (76.9 kg)  12/31/18 169 lb 9 oz (76.9 kg)  10/24/18 165 lb 1 oz (74.9 kg)    Physical Exam Vitals signs and nursing note reviewed.  Constitutional:      General: He is not in acute distress.    Appearance: Normal appearance. He is not ill-appearing, toxic-appearing or diaphoretic.  HENT:     Head: Normocephalic and atraumatic.     Right Ear: Tympanic membrane, ear canal and external ear normal. There is no impacted cerumen.     Left Ear: Tympanic membrane, ear canal and external ear normal. There is no impacted cerumen.     Nose: Nose normal. No congestion or rhinorrhea.     Mouth/Throat:     Mouth: Mucous membranes are moist.     Pharynx: Oropharynx is clear. No oropharyngeal exudate or posterior oropharyngeal erythema.  Eyes:  General: No scleral icterus.       Right eye: No discharge.        Left eye: No discharge.     Extraocular Movements: Extraocular movements intact.     Conjunctiva/sclera: Conjunctivae normal.     Pupils: Pupils are equal, round, and reactive to light.   Neck:     Musculoskeletal: Normal range of motion and neck supple.  Cardiovascular:     Rate and Rhythm: Normal rate and regular rhythm.     Pulses: Normal pulses.     Heart sounds: Normal heart sounds. No murmur. No friction rub. No gallop.   Pulmonary:     Effort: Pulmonary effort is normal. No respiratory distress.     Breath sounds: Normal breath sounds. No stridor. No wheezing, rhonchi or rales.  Chest:     Chest wall: No tenderness.  Musculoskeletal: Normal range of motion.  Skin:    General: Skin is warm and dry.     Capillary Refill: Capillary refill takes less than 2 seconds.     Coloration: Skin is not jaundiced or pale.     Findings: No bruising, erythema, lesion or rash.  Neurological:     General: No focal deficit present.     Mental Status: He is alert and oriented to person, place, and time. Mental status is at baseline.  Psychiatric:        Mood and Affect: Mood normal.        Behavior: Behavior normal.        Thought Content: Thought content normal.        Judgment: Judgment normal.     Results for orders placed or performed in visit on 03/20/19  Microscopic Examination   URINE  Result Value Ref Range   WBC, UA >30 (A) 0 - 5 /hpf   RBC >30 (A) 0 - 2 /hpf   Epithelial Cells (non renal) 0-10 0 - 10 /hpf   Crystals Present N/A   Crystal Type Calcium Oxalate N/A   Mucus, UA Present Not Estab.   Bacteria, UA Few (A) None seen/Few  Urine Culture, Reflex   URINE  Result Value Ref Range   Urine Culture, Routine Final report    Organism ID, Bacteria Comment   UA/M w/rflx Culture, Routine   Specimen: Urine   URINE  Result Value Ref Range   Specific Gravity, UA 1.010 1.005 - 1.030   pH, UA 5.0 5.0 - 7.5   Color, UA Yellow Yellow   Appearance Ur Cloudy (A) Clear   Leukocytes,UA 3+ (A) Negative   Protein,UA 2+ (A) Negative/Trace   Glucose, UA Negative Negative   Ketones, UA Negative Negative   RBC, UA 3+ (A) Negative   Bilirubin, UA Negative Negative    Urobilinogen, Ur 0.2 0.2 - 1.0 mg/dL   Nitrite, UA Negative Negative   Microscopic Examination See below:    Urinalysis Reflex Comment       Assessment & Plan:   Problem List Items Addressed This Visit      Cardiovascular and Mediastinum   Hypertension - Primary    Under good control on current regimen. Continue current regimen. Continue to monitor. Call with any concerns. Refills given. Labs drawn today.       Relevant Medications   carvedilol (COREG) 6.25 MG tablet   enalapril (VASOTEC) 10 MG tablet   hydrochlorothiazide (HYDRODIURIL) 25 MG tablet   lovastatin (MEVACOR) 40 MG tablet   Other Relevant Orders   CBC with Differential/Platelet  Comprehensive metabolic panel   Microalbumin, Urine Waived     Nervous and Auditory   Diabetic peripheral neuropathy associated with type 2 diabetes mellitus ([place])    Under good control on current regimen with A1c of 5.6. Will stop his glipizide. Continue other medicines. Continue to monitor. Call with any concerns. Refills given. Labs drawn today.       Relevant Medications   enalapril (VASOTEC) 10 MG tablet   gabapentin (NEURONTIN) 300 MG capsule   Insulin Glargine (LANTUS SOLOSTAR) 100 UNIT/ML Solostar Pen   lovastatin (MEVACOR) 40 MG tablet   metFORMIN (GLUCOPHAGE) 1000 MG tablet   mirtazapine (REMERON) 15 MG tablet   traZODone (DESYREL) 100 MG tablet   Other Relevant Orders   Bayer DCA Hb A1c Waived   CBC with Differential/Platelet   Comprehensive metabolic panel   Microalbumin, Urine Waived   UA/M w/rflx Culture, Routine     Genitourinary   Recurrent UTI    +WBC today- no symptoms. Await culture and treat if needed. Follow up with urology as needed.       Relevant Medications   valACYclovir (VALTREX) 1000 MG tablet     Other   Hyperlipidemia    Under good control on current regimen. Continue current regimen. Continue to monitor. Call with any concerns. Refills given. Labs drawn today.       Relevant Medications    carvedilol (COREG) 6.25 MG tablet   enalapril (VASOTEC) 10 MG tablet   hydrochlorothiazide (HYDRODIURIL) 25 MG tablet   lovastatin (MEVACOR) 40 MG tablet   Other Relevant Orders   CBC with Differential/Platelet   Comprehensive metabolic panel   Lipid Panel w/o Chol/HDL Ratio    Other Visit Diagnoses    Dyslipidemia       Relevant Medications   lovastatin (MEVACOR) 40 MG tablet   Right ear pain       Normal exam. Follow up with ENT as needed.        Follow up plan: Return in about 3 months (around 07/11/2019) for DM visit.

## 2019-04-10 NOTE — Assessment & Plan Note (Signed)
Under good control on current regimen with A1c of 5.6. Will stop his glipizide. Continue other medicines. Continue to monitor. Call with any concerns. Refills given. Labs drawn today.

## 2019-04-11 ENCOUNTER — Encounter: Payer: Self-pay | Admitting: Family Medicine

## 2019-04-11 LAB — LIPID PANEL W/O CHOL/HDL RATIO
Cholesterol, Total: 137 mg/dL (ref 100–199)
HDL: 44 mg/dL (ref >39)
LDL Calculated: 70 mg/dL (ref 0–99)
Triglycerides: 114 mg/dL (ref 0–149)
VLDL Cholesterol Cal: 23 mg/dL (ref 5–40)

## 2019-04-11 LAB — COMPREHENSIVE METABOLIC PANEL
ALT: 3 IU/L (ref 0–44)
AST: 16 IU/L (ref 0–40)
Albumin/Globulin Ratio: 1.8 (ref 1.2–2.2)
Albumin: 4.2 g/dL (ref 3.7–4.7)
Alkaline Phosphatase: 54 IU/L (ref 39–117)
BUN/Creatinine Ratio: 14 (ref 10–24)
BUN: 14 mg/dL (ref 8–27)
Bilirubin Total: 0.4 mg/dL (ref 0.0–1.2)
CO2: 22 mmol/L (ref 20–29)
Calcium: 9.3 mg/dL (ref 8.6–10.2)
Chloride: 108 mmol/L — ABNORMAL HIGH (ref 96–106)
Creatinine, Ser: 0.98 mg/dL (ref 0.76–1.27)
GFR calc Af Amer: 86 mL/min/{1.73_m2} (ref >59)
GFR calc non Af Amer: 74 mL/min/{1.73_m2} (ref >59)
Globulin, Total: 2.3 g/dL (ref 1.5–4.5)
Glucose: 94 mg/dL (ref 65–99)
Potassium: 4.1 mmol/L (ref 3.5–5.2)
Sodium: 145 mmol/L — ABNORMAL HIGH (ref 134–144)
Total Protein: 6.5 g/dL (ref 6.0–8.5)

## 2019-04-11 LAB — CBC WITH DIFFERENTIAL/PLATELET
Basophils Absolute: 0.1 10*3/uL (ref 0.0–0.2)
Basos: 1 %
EOS (ABSOLUTE): 0.3 10*3/uL (ref 0.0–0.4)
Eos: 3 %
Hematocrit: 40.3 % (ref 37.5–51.0)
Hemoglobin: 13.5 g/dL (ref 13.0–17.7)
Immature Grans (Abs): 0 10*3/uL (ref 0.0–0.1)
Immature Granulocytes: 1 %
Lymphocytes Absolute: 2.4 10*3/uL (ref 0.7–3.1)
Lymphs: 27 %
MCH: 28.2 pg (ref 26.6–33.0)
MCHC: 33.5 g/dL (ref 31.5–35.7)
MCV: 84 fL (ref 79–97)
Monocytes Absolute: 0.6 10*3/uL (ref 0.1–0.9)
Monocytes: 7 %
Neutrophils Absolute: 5.3 10*3/uL (ref 1.4–7.0)
Neutrophils: 61 %
Platelets: 247 10*3/uL (ref 150–450)
RBC: 4.79 x10E6/uL (ref 4.14–5.80)
RDW: 14.6 % (ref 11.6–15.4)
WBC: 8.7 10*3/uL (ref 3.4–10.8)

## 2019-04-12 ENCOUNTER — Other Ambulatory Visit: Payer: Self-pay | Admitting: Family Medicine

## 2019-04-12 LAB — URINE CULTURE, REFLEX

## 2019-04-12 LAB — UA/M W/RFLX CULTURE, ROUTINE
Bilirubin, UA: NEGATIVE
Glucose, UA: NEGATIVE
Ketones, UA: NEGATIVE
Nitrite, UA: POSITIVE — AB
Protein,UA: NEGATIVE
Specific Gravity, UA: 1.015 (ref 1.005–1.030)
Urobilinogen, Ur: 0.2 mg/dL (ref 0.2–1.0)
pH, UA: 5 (ref 5.0–7.5)

## 2019-04-12 LAB — MICROSCOPIC EXAMINATION: WBC, UA: >30 /hpf — AB (ref 0–5)

## 2019-04-12 LAB — MICROALBUMIN, URINE WAIVED
Creatinine, Urine Waived: 10 mg/dL (ref 10–300)
Microalb, Ur Waived: 80 mg/L — ABNORMAL HIGH (ref 0–19)
Microalb/Creat Ratio: >300 mg/g — ABNORMAL HIGH (ref <30)

## 2019-04-12 LAB — BAYER DCA HB A1C WAIVED: HB A1C (BAYER DCA - WAIVED): 5.6 % (ref <7.0)

## 2019-04-12 MED ORDER — AMOXICILLIN-POT CLAVULANATE 875-125 MG PO TABS: 1.0000 | ORAL_TABLET | Freq: Two times a day (BID) | ORAL | 0 refills | Status: DC

## 2019-04-14 ENCOUNTER — Telehealth: Payer: Self-pay | Admitting: Family Medicine

## 2019-04-14 NOTE — Telephone Encounter (Signed)
Patient notified

## 2019-04-14 NOTE — Telephone Encounter (Signed)
Please let Rick [name] know he dose have a UTI, So I've sent medicine to his pharmacy. Thanks!

## 2019-04-21 ENCOUNTER — Other Ambulatory Visit: Payer: Self-pay | Admitting: Family Medicine

## 2019-04-22 DIAGNOSIS — Q279 Congenital malformation of peripheral vascular system, unspecified: Secondary | ICD-10-CM | POA: Diagnosis not present

## 2019-04-22 DIAGNOSIS — L700 Acne vulgaris: Secondary | ICD-10-CM | POA: Diagnosis not present

## 2019-04-22 DIAGNOSIS — Z85828 Personal history of other malignant neoplasm of skin: Secondary | ICD-10-CM | POA: Diagnosis not present

## 2019-04-22 DIAGNOSIS — L820 Inflamed seborrheic keratosis: Secondary | ICD-10-CM | POA: Diagnosis not present

## 2019-04-22 DIAGNOSIS — D1801 Hemangioma of skin and subcutaneous tissue: Secondary | ICD-10-CM | POA: Diagnosis not present

## 2019-04-30 DIAGNOSIS — H93293 Other abnormal auditory perceptions, bilateral: Secondary | ICD-10-CM | POA: Diagnosis not present

## 2019-04-30 DIAGNOSIS — J301 Allergic rhinitis due to pollen: Secondary | ICD-10-CM | POA: Diagnosis not present

## 2019-04-30 DIAGNOSIS — H6123 Impacted cerumen, bilateral: Secondary | ICD-10-CM | POA: Diagnosis not present

## 2019-05-07 ENCOUNTER — Telehealth: Payer: Self-pay

## 2019-05-07 NOTE — Telephone Encounter (Signed)
Copied from [place] [phone]. Topic: General - Other >> May 07, 2019  2:07 PM Celene Kras A wrote: Reason for CRM: Pt called stating he is having bladder infections frequent. Pt is requesting to speak with PCP nurse about this. Pt is requesting to know if he should bring in a urine sample to have it tested. Please advise. Pt states he is willing to speak with [place] today.   Routing to provider. Is it ok for the patient to bring in a urine sample?

## 2019-05-07 NOTE — Telephone Encounter (Signed)
No. He cannot just drop off a urine sample. He can have an appointment where we can discuss his urine issues or he can call Dr. Gloriann Loan. He has been referred to [facility] urology- can we please check on that status of his referral?

## 2019-05-08 NOTE — Telephone Encounter (Signed)
Dr. Wynetta Emery was notified this and stated that she will discuss with the patient tomorrow.

## 2019-05-08 NOTE — Telephone Encounter (Signed)
Patient notified. I asked the patient is he had heard from [facility] about his referral and he states that he is not going to [facility].

## 2019-05-08 NOTE — Telephone Encounter (Signed)
[facility]. They state that they have tried to reach out to the patient to schedule an appointment but could not get in touch with him. Will call and let the patient know.

## 2019-05-09 ENCOUNTER — Other Ambulatory Visit: Payer: Medicare Other

## 2019-05-09 ENCOUNTER — Ambulatory Visit: Payer: Self-pay

## 2019-05-09 ENCOUNTER — Other Ambulatory Visit: Payer: Self-pay

## 2019-05-09 ENCOUNTER — Ambulatory Visit (INDEPENDENT_AMBULATORY_CARE_PROVIDER_SITE_OTHER): Payer: Medicare Other | Admitting: Family Medicine

## 2019-05-09 ENCOUNTER — Encounter: Payer: Self-pay | Admitting: Family Medicine

## 2019-05-09 DIAGNOSIS — N390 Urinary tract infection, site not specified: Secondary | ICD-10-CM

## 2019-05-09 DIAGNOSIS — R300 Dysuria: Secondary | ICD-10-CM

## 2019-05-09 MED ORDER — AMOXICILLIN-POT CLAVULANATE 875-125 MG PO TABS: 1.0000 | ORAL_TABLET | Freq: Two times a day (BID) | ORAL | 0 refills | Status: DC

## 2019-05-09 NOTE — Addendum Note (Signed)
Addended by: [name] on: 05/09/2019 09:16 AM   Modules accepted: Orders

## 2019-05-09 NOTE — Assessment & Plan Note (Signed)
Discussed the importance of not self-treating UTI to decrease resistance. Will treat augmentin for 10 day. Call with any concerns or if not getting better.

## 2019-05-09 NOTE — Progress Notes (Signed)
There were no vitals taken for this visit.   Subjective:    Patient ID: Rick [name], male    DOB: January 22, 1941, 78 y.o.   MRN: [id number]  HPI: Rick [name] is a 78 y.o. male  Chief Complaint  Patient presents with  . Urinary Tract Infection    cloudy urine.   . Urinary Frequency  . Abdominal Pain    lower   URINARY SYMPTOMS Duration: about 4-5 days ago- started on some old amoxicillin that he had at home, has been taking 1 daily Dysuria: burning Urinary frequency: yes Urgency: yes Small volume voids: yes Symptom severity: mild Urinary incontinence: no Foul odor: yes Hematuria: no Abdominal pain: yes Back pain: no Suprapubic pain/pressure: yes Flank pain: no Fever:  no Vomiting: no Relief with cranberry juice: no Relief with pyridium: no Status: worse Previous urinary tract infection: yes Recurrent urinary tract infection: yes Sexual activity: practicing safe sex History of sexually transmitted disease: yes Penile discharge: no Treatments attempted: antibiotics, cranberry and increasing fluids   Relevant past medical, surgical, family and social history reviewed and updated as indicated. Interim medical history since our last visit reviewed. Allergies and medications reviewed and updated.  Review of Systems  Constitutional: Negative.   Respiratory: Negative.   Cardiovascular: Negative.   Genitourinary: Positive for decreased urine volume, dysuria, frequency and urgency. Negative for difficulty urinating, discharge, enuresis, flank pain, genital sores, hematuria, penile pain, penile swelling, scrotal swelling and testicular pain.  Skin: Negative.   Psychiatric/Behavioral: Negative.     Per HPI unless specifically indicated above     Objective:    There were no vitals taken for this visit.  Wt Readings from Last 3 Encounters:  01/03/19 169 lb 9 oz (76.9 kg)  12/31/18 169 lb 9 oz (76.9 kg)  10/24/18 165 lb 1 oz (74.9 kg)    Physical Exam Vitals  signs and nursing note reviewed.  Constitutional:      General: He is not in acute distress.    Appearance: Normal appearance. He is not ill-appearing, toxic-appearing or diaphoretic.  HENT:     Head: Normocephalic and atraumatic.     Right Ear: External ear normal.     Left Ear: External ear normal.     Nose: Nose normal.     Mouth/Throat:     Mouth: Mucous membranes are moist.     Pharynx: Oropharynx is clear.  Eyes:     General: No scleral icterus.       Right eye: No discharge.        Left eye: No discharge.     Extraocular Movements: Extraocular movements intact.     Conjunctiva/sclera: Conjunctivae normal.     Pupils: Pupils are equal, round, and reactive to light.  Neck:     Musculoskeletal: Normal range of motion and neck supple.  Cardiovascular:     Rate and Rhythm: Normal rate and regular rhythm.     Pulses: Normal pulses.     Heart sounds: Normal heart sounds. No murmur. No friction rub. No gallop.   Pulmonary:     Effort: Pulmonary effort is normal. No respiratory distress.     Breath sounds: Normal breath sounds. No stridor. No wheezing, rhonchi or rales.  Chest:     Chest wall: No tenderness.  Musculoskeletal: Normal range of motion.  Skin:    General: Skin is warm and dry.     Capillary Refill: Capillary refill takes less than 2 seconds.     Coloration:  Skin is not jaundiced or pale.     Findings: No bruising, erythema, lesion or rash.  Neurological:     General: No focal deficit present.     Mental Status: He is alert and oriented to person, place, and time. Mental status is at baseline.  Psychiatric:        Mood and Affect: Mood normal.        Behavior: Behavior normal.        Thought Content: Thought content normal.        Judgment: Judgment normal.     Results for orders placed or performed in visit on 04/10/19  Microscopic Examination   URINE  Result Value Ref Range   WBC, UA >30 (A) 0 - 5 /hpf   RBC 3-10 (A) 0 - 2 /hpf   Epithelial Cells (non  renal) 0-10 0 - 10 /hpf   Bacteria, UA Many (A) None seen/Few  Urine Culture, Reflex   URINE  Result Value Ref Range   Urine Culture, Routine Final report (A)    Organism ID, Bacteria Escherichia coli (A)    Antimicrobial Susceptibility Comment   Bayer DCA Hb A1c Waived  Result Value Ref Range   HB A1C (BAYER DCA - WAIVED) 5.6 <7.0 %  CBC with Differential/Platelet  Result Value Ref Range   WBC 8.7 3.4 - 10.8 x10E3/uL   RBC 4.79 4.14 - 5.80 x10E6/uL   Hemoglobin 13.5 13.0 - 17.7 g/dL   Hematocrit 40.3 37.5 - 51.0 %   MCV 84 79 - 97 fL   MCH 28.2 26.6 - 33.0 pg   MCHC 33.5 31.5 - 35.7 g/dL   RDW 14.6 11.6 - 15.4 %   Platelets 247 150 - 450 x10E3/uL   Neutrophils 61 Not Estab. %   Lymphs 27 Not Estab. %   Monocytes 7 Not Estab. %   Eos 3 Not Estab. %   Basos 1 Not Estab. %   Neutrophils Absolute 5.3 1.4 - 7.0 x10E3/uL   Lymphocytes Absolute 2.4 0.7 - 3.1 x10E3/uL   Monocytes Absolute 0.6 0.1 - 0.9 x10E3/uL   EOS (ABSOLUTE) 0.3 0.0 - 0.4 x10E3/uL   Basophils Absolute 0.1 0.0 - 0.2 x10E3/uL   Immature Granulocytes 1 Not Estab. %   Immature Grans (Abs) 0.0 0.0 - 0.1 x10E3/uL  Comprehensive metabolic panel  Result Value Ref Range   Glucose 94 65 - 99 mg/dL   BUN 14 8 - 27 mg/dL   Creatinine, Ser 0.98 0.76 - 1.27 mg/dL   GFR calc non Af Amer 74 >59 mL/min/1.73   GFR calc Af Amer 86 >59 mL/min/1.73   BUN/Creatinine Ratio 14 10 - 24   Sodium 145 (H) 134 - 144 mmol/L   Potassium 4.1 3.5 - 5.2 mmol/L   Chloride 108 (H) 96 - 106 mmol/L   CO2 22 20 - 29 mmol/L   Calcium 9.3 8.6 - 10.2 mg/dL   Total Protein 6.5 6.0 - 8.5 g/dL   Albumin 4.2 3.7 - 4.7 g/dL   Globulin, Total 2.3 1.5 - 4.5 g/dL   Albumin/Globulin Ratio 1.8 1.2 - 2.2   Bilirubin Total 0.4 0.0 - 1.2 mg/dL   Alkaline Phosphatase 54 39 - 117 IU/L   AST 16 0 - 40 IU/L   ALT 3 0 - 44 IU/L  Lipid Panel w/o Chol/HDL Ratio  Result Value Ref Range   Cholesterol, Total 137 100 - 199 mg/dL   Triglycerides 114 0 - 149  mg/dL   HDL 44 >  39 mg/dL   VLDL Cholesterol Cal 23 5 - 40 mg/dL   LDL Calculated 70 0 - 99 mg/dL  Microalbumin, Urine Waived  Result Value Ref Range   Microalb, Ur Waived 80 (H) 0 - 19 mg/L   Creatinine, Urine Waived 10 10 - 300 mg/dL   Microalb/Creat Ratio >300 (H) <30 mg/g  UA/M w/rflx Culture, Routine   Specimen: Urine   URINE  Result Value Ref Range   Specific Gravity, UA 1.015 1.005 - 1.030   pH, UA 5.0 5.0 - 7.5   Color, UA Yellow Yellow   Appearance Ur Cloudy (A) Clear   Leukocytes,UA 3+ (A) Negative   Protein,UA Negative Negative/Trace   Glucose, UA Negative Negative   Ketones, UA Negative Negative   RBC, UA Trace (A) Negative   Bilirubin, UA Negative Negative   Urobilinogen, Ur 0.2 0.2 - 1.0 mg/dL   Nitrite, UA Positive (A) Negative   Microscopic Examination See below:    Urinalysis Reflex Comment       Assessment & Plan:   Problem List Items Addressed This Visit      Genitourinary   Recurrent UTI - Primary    Discussed the importance of not self-treating UTI to decrease resistance. Will treat augmentin for 10 day. Call with any concerns or if not getting better.        Other Visit Diagnoses    Dysuria       Partially treated with augmentin at home.    Relevant Orders   UA/M w/rflx Culture, Routine       Follow up plan: Return if symptoms worsen or fail to improve.   . This visit was completed via telephone due to the restrictions of the COVID-19 pandemic. All issues as above were discussed and addressed but no physical exam was performed. If it was felt that the patient should be evaluated in the office, they were directed there. The patient verbally consented to this visit. Patient was unable to complete an audio/visual visit due to Lack of equipment. Due to the catastrophic nature of the COVID-19 pandemic, this visit was done through audio contact only. . Location of the patient: home . Location of the provider: work . Those involved with this call:   . Provider: Park Liter, DO . CMA: Lesle Chris, [place] . Front Desk/Registration: Don Perking  . Time spent on call: 21 minutes on the phone discussing health concerns. 23 minutes total spent in review of patient's record and preparation of their chart.

## 2019-05-13 ENCOUNTER — Telehealth: Payer: Self-pay

## 2019-05-13 LAB — MICROSCOPIC EXAMINATION

## 2019-05-13 LAB — UA/M W/RFLX CULTURE, ROUTINE
Bilirubin, UA: NEGATIVE
Glucose, UA: NEGATIVE
Ketones, UA: NEGATIVE
Nitrite, UA: NEGATIVE
Protein,UA: NEGATIVE
Specific Gravity, UA: <1.005 — ABNORMAL LOW (ref 1.005–1.030)
Urobilinogen, Ur: 0.2 mg/dL (ref 0.2–1.0)
pH, UA: 5 (ref 5.0–7.5)

## 2019-05-13 LAB — URINE CULTURE, REFLEX

## 2019-05-13 NOTE — Telephone Encounter (Signed)
Tried calling patient to see if he would like his pneumonia shot. Also due for flu vaccine. Phone kept ringing, no answer. Will try again later.

## 2019-05-15 ENCOUNTER — Ambulatory Visit: Payer: Self-pay

## 2019-05-15 DIAGNOSIS — N390 Urinary tract infection, site not specified: Secondary | ICD-10-CM

## 2019-05-15 NOTE — Telephone Encounter (Signed)
He can come in and leave another urine to see if it's clear, he can finish his medicine or he can go see Dr. Gloriann Loan.

## 2019-05-15 NOTE — Telephone Encounter (Signed)
He has taken that medicine many times before, with no side effects. He does have a UTI which is sensitive to augmentin. I would advise him to finish the course of antibiotics and if his parkinsons seems to be acting up I'd have him call his neurologist.

## 2019-05-15 NOTE — Telephone Encounter (Signed)
Called and spoke with patient, he states the foundation told him that "people with parkinson's disease have this medication side effect a lot". He states that this has been happening every time with medication but has gotten increasing worse each time he takes the medication. He said that his urine is now clear and that he is better.

## 2019-05-15 NOTE — Telephone Encounter (Signed)
Patient called stating that he is taking Augmentin for a UTI. He has 3.5 days of medication to complete.  He states his UTI symptoms have resolved. He states because of his Parkinson's he is not feeling good on this medication. No rashes.  He has spoke with the [place] and they informed him he needs to come off because it "will mess with the nervous system". He feels very nervous and tense. Per protocol call was placed to office.  Note will be routed for Dr Wynetta Emery to advise patient per office instruction.  Reason for Disposition . [1] Caller has URGENT medication question about med that PCP or specialist prescribed AND [2] triager unable to answer question  Answer Assessment - Initial Assessment Questions 1.   NAME of MEDICATION: "What medicine are you calling about?"     augmentin 2.   QUESTION: "What is your question?"     Side effects 3.   PRESCRIBING HCP: "Who prescribed it?" Reason: if prescribed by specialist, call should be referred to that group.    Dr Wynetta Emery 4. SYMPTOMS: "Do you have any symptoms?"     Tense, part of parkinsons 5. SEVERITY: If symptoms are present, ask "Are they mild, moderate or severe?"     Very tense feeling 6.  PREGNANCY:  "Is there any chance that you are pregnant?" "When was your last menstrual period?"     N/A  Protocols used: MEDICATION QUESTION CALL-A-AH

## 2019-05-16 ENCOUNTER — Other Ambulatory Visit: Payer: Medicare Other

## 2019-05-16 ENCOUNTER — Other Ambulatory Visit: Payer: Self-pay

## 2019-05-16 DIAGNOSIS — R300 Dysuria: Secondary | ICD-10-CM

## 2019-05-16 NOTE — Telephone Encounter (Signed)
Patient will bring a urine in.

## 2019-05-16 NOTE — Addendum Note (Signed)
Addended by: [name] on: 05/16/2019 12:03 PM   Modules accepted: Orders

## 2019-05-18 LAB — UA/M W/RFLX CULTURE, ROUTINE
Bilirubin, UA: NEGATIVE
Glucose, UA: NEGATIVE
Ketones, UA: NEGATIVE
Nitrite, UA: NEGATIVE
Protein,UA: NEGATIVE
RBC, UA: NEGATIVE
Specific Gravity, UA: 1.01 (ref 1.005–1.030)
Urobilinogen, Ur: 0.2 mg/dL (ref 0.2–1.0)
pH, UA: 5 (ref 5.0–7.5)

## 2019-05-18 LAB — URINE CULTURE, REFLEX: Organism ID, Bacteria: NO GROWTH

## 2019-05-18 LAB — MICROSCOPIC EXAMINATION
Bacteria, UA: NONE SEEN
RBC: NONE SEEN /hpf (ref 0–2)

## 2019-05-19 ENCOUNTER — Telehealth: Payer: Self-pay | Admitting: Family Medicine

## 2019-05-19 NOTE — Telephone Encounter (Signed)
Please let him know that his urine did not grow anything out, so he can stop the augmentin.

## 2019-05-19 NOTE — Telephone Encounter (Signed)
Patient notified

## 2019-05-23 NOTE — Addendum Note (Signed)
Addended by: [name] on: 05/23/2019 08:53 AM   Modules accepted: Orders

## 2019-06-04 ENCOUNTER — Ambulatory Visit (INDEPENDENT_AMBULATORY_CARE_PROVIDER_SITE_OTHER): Payer: Medicare Other

## 2019-06-04 ENCOUNTER — Telehealth: Payer: Self-pay | Admitting: Family Medicine

## 2019-06-04 DIAGNOSIS — Z0000 Encounter for general adult medical examination without abnormal findings: Secondary | ICD-10-CM

## 2019-06-04 NOTE — Progress Notes (Signed)
Subjective:   Rick [name] is a 78 y.o. male who presents for Medicare Annual/Subsequent preventive examination.  This visit is being conducted via phone call  - after an attmept to do on video chat - due to the COVID-19 pandemic. This patient has given me verbal consent via phone to conduct this visit, patient states they are participating from their home address. Some vital signs may be absent or patient reported.   Patient identification: identified by name, DOB, and current address.    Review of Systems:   Cardiac Risk Factors include: advanced age (>79mn, >>49women);male gender;diabetes mellitus;dyslipidemia;hypertension     Objective:    Vitals: There were no vitals taken for this visit.  There is no height or weight on file to calculate BMI.  Advanced Directives 01/03/2019 12/31/2018 03/22/2018 12/07/2016 01/14/2016 01/11/2016 09/27/2015  Does Patient Have a Medical Advance Directive? No No No No - No No  Would patient like information on creating a medical advance directive? No - Patient declined No - Patient declined No - Patient declined No - Patient declined No - patient declined information No - patient declined information No - patient declined information    Tobacco Social History   Tobacco Use  Smoking Status Former Smoker  . Packs/day: 1.50  . Years: 15.00  . Pack years: 22.50  . Types: Cigarettes  . Start date: 08/14/1994  . Quit date: 03/23/2005  . Years since quitting: 14.2  Smokeless Tobacco Former USystems developer . Types: Snuff, Chew  . Quit date: 03/23/2010     Counseling given: Not Answered   Clinical Intake:  Pre-visit preparation completed: Yes  Pain : No/denies pain     Nutritional Risks: None Diabetes: Yes CBG done?: No Did pt. bring in CBG monitor from home?: No  How often do you need to have someone help you when you read instructions, pamphlets, or other written materials from your doctor or pharmacy?: 1 - Never  Interpreter Needed?: No   Information entered by :: Tiffany Hill,LPN   Nutrition Risk Assessment:  Has the patient had any N/V/D within the last 2 months?  No  Does the patient have any non-healing wounds?  No  Has the patient had any unintentional weight loss or weight gain?  No   Diabetes:  Is the patient diabetic?  Yes  If diabetic, was a CBG obtained today?  No  Did the patient bring in their glucometer from home?  No  How often do you monitor your CBG's?   Financial Strains and Diabetes Management:  Are you having any financial strains with the device, your supplies or your medication? yes Does the patient want to be seen by Chronic Care Management for management of their diabetes?  No  Would the patient like to be referred to a Nutritionist or for Diabetic Management?  No   Diabetic Exams:  Diabetic Eye Exam: Completed 01/14/2019.   Diabetic Foot Exam: Completed 05/28/2018. Pt has been advised about the importance in completing this exam.    Past Medical History:  Diagnosis Date  . Anxiety   . Bipolar disorder ([facility]   . Bladder cancer ([facility]   . BPH (benign prostatic hypertrophy)   . Chest pain, atypical 06/23/2015  . Cystitis    hx of   . Depression   . Diabetes mellitus without complication ([place]    type 2   . Difficult or painful urination 10/31/2012  . Dysrhythmia   . Ear problem 03/24/2015  . Gangrenous appendicitis   .  H/O urinary disorder 03/27/2013  . Herpes genitalis in men   . Hyperlipidemia   . Hypertension   . Insomnia   . Kidney stones   . Left hand pain 05/18/2015  . Mass of arm 02/10/2015  . Peripheral neuropathy   . Skin cancer   . UD (urethral discharge) 10/31/2012  . Urinary tract infection    hx of    Past Surgical History:  Procedure Laterality Date  . APPENDECTOMY     RUPTURED  . BLADDER SURGERY    . CHOLECYSTECTOMY    . COLOSTOMY     AND LATER CLOSURE  . CYSTOSCOPY/URETEROSCOPY/HOLMIUM LASER/STENT PLACEMENT Left 03/22/2018   Procedure: CYSTOSCOPY/LEFT  URETEROSCOPY/LEFT RETROGRADE PYELOGRAM;  Surgeon: Lucas Mallow, MD;  Location: [facility];  Service: Urology;  Laterality: Left;  . EPIDIDYMECTOMY N/A 01/03/2019   Procedure: RIGHT EPIDIDYMAL CYST REMOVAL VERSES EPIDIDMECTOMY;  Surgeon: Lucas Mallow, MD;  Location: [facility];  Service: Urology;  Laterality: N/A;  . FRACTURE SURGERY    . HERNIA REPAIR    . INNER EAR SURGERY Left   . ORIF ANKLE FRACTURE Left 09/27/2015   Procedure: OPEN REDUCTION INTERNAL FIXATION (ORIF) ANKLE FRACTURE;  Surgeon: Corky Mull, MD;  Location: [facility];  Service: Orthopedics;  Laterality: Left;  . SKIN CANCER EXCISION    . TRANSURETHRAL RESECTION OF PROSTATE N/A 03/22/2018   Procedure: TRANSURETHRAL RESECTION OF THE PROSTATE (TURP);  Surgeon: Lucas Mallow, MD;  Location: [facility];  Service: Urology;  Laterality: N/A;  . VENTRAL HERNIA REPAIR N/A 01/14/2016   Procedure: HERNIA REPAIR VENTRAL ADULT;  Surgeon: Leonie Green, MD;  Location: [facility];  Service: General;  Laterality: N/A;   Family History  Problem Relation Age of Onset  . Transient ischemic attack Mother   . Diabetes Father   . Cancer Brother    Social History   Socioeconomic History  . Marital status: Divorced    Spouse name: Not on file  . Number of children: Not on file  . Years of education: Not on file  . Highest education level: Not on file  Occupational History  . Not on file  Social Needs  . Financial resource strain: Not on file  . Food insecurity    Worry: Not on file    Inability: Not on file  . Transportation needs    Medical: Not on file    Non-medical: Not on file  Tobacco Use  . Smoking status: Former Smoker    Packs/day: 1.50    Years: 15.00    Pack years: 22.50    Types: Cigarettes    Start date: 08/14/1994    Quit date: 03/23/2005    Years since quitting: 14.2  . Smokeless tobacco: Former Systems developer    Types: Snuff, Sarina Ser    Quit date: 03/23/2010  Substance and Sexual Activity  . Alcohol use: No     Alcohol/week: 0.0 standard drinks  . Drug use: No  . Sexual activity: Never  Lifestyle  . Physical activity    Days per week: Not on file    Minutes per session: Not on file  . Stress: Not on file  Relationships  . Social Herbalist on phone: Not on file    Gets together: Not on file    Attends religious service: Not on file    Active member of club or organization: Not on file    Attends meetings of clubs or organizations: Not on file  Relationship status: Not on file  Other Topics Concern  . Not on file  Social History Narrative  . Not on file    Outpatient Encounter Medications as of 06/04/2019  Medication Sig  . Blood Glucose Monitoring Suppl (ONE TOUCH ULTRA SYSTEM KIT) w/Device KIT 1 kit by Does not apply route once.  . carbidopa-levodopa (SINEMET IR) 25-100 MG tablet Take 1 tablet by mouth 4 (four) times daily.   . carvedilol (COREG) 6.25 MG tablet Take 1 tablet (6.25 mg total) by mouth 2 (two) times daily with a meal.  . diclofenac sodium (VOLTAREN) 1 % GEL Apply 2 g topically 4 (four) times daily. (Patient taking differently: Apply 2 g topically 4 (four) times daily as needed (lower back pain.). )  . enalapril (VASOTEC) 10 MG tablet Take 1 tablet (10 mg total) by mouth daily.  . fluticasone (FLONASE) 50 MCG/ACT nasal spray Place 2 sprays into both nostrils 2 (two) times daily.  [name] gabapentin (NEURONTIN) 300 MG capsule Take 2 capsules (600 mg total) by mouth 2 (two) times daily.  . hydrochlorothiazide (HYDRODIURIL) 25 MG tablet TAKE ONE (1) TABLET EACH DAY  . Insulin Glargine (LANTUS SOLOSTAR) 100 UNIT/ML Solostar Pen Inject 15 Units into the skin daily at 10 pm.  . Insulin Pen Needle (ULTICARE PEN NEEDLES) 29G X 12MM MISC USE AS DIRECTED  . Lancets (ONETOUCH ULTRASOFT) lancets TEST BLOOD SUGAR THREE TIMES DAILY  . lovastatin (MEVACOR) 40 MG tablet Take 1 tablet (40 mg total) by mouth daily.  . Menthol, Topical Analgesic, (BENGAY EX) Apply 1 application  topically 3 (three) times daily as needed (lower back pain.).  [name] metFORMIN (GLUCOPHAGE) 1000 MG tablet TAKE 1 TABLET BY MOUTH TWICE A DAY WITH A MEAL  . mirtazapine (REMERON) 15 MG tablet Take 1 tablet (15 mg total) by mouth at bedtime.  Glory Rosebush ULTRA test strip TEST BLOOD SUGAR THREE TIMES DAILY.  . silodosin (RAPAFLO) 8 MG CAPS capsule Take 1 capsule (8 mg total) by mouth daily with breakfast.  . traZODone (DESYREL) 100 MG tablet Take 1 tablet (100 mg total) by mouth at bedtime.  . valACYclovir (VALTREX) 1000 MG tablet Take 1 tablet (1,000 mg total) by mouth daily as needed.  . [DISCONTINUED] amoxicillin-clavulanate (AUGMENTIN) 875-125 MG tablet Take 1 tablet by mouth 2 (two) times daily. (Patient not taking: Reported on 06/04/2019)   No facility-administered encounter medications on file as of 06/04/2019.     Activities of Daily Living In your present state of health, do you have any difficulty performing the following activities: 06/04/2019 12/31/2018  Hearing? N Y  Comment no hearing aids, cant hear out of left ear well L ear hearing loss  Vision? N N  Comment no glasses, [facility] -  Difficulty concentrating or making decisions? N N  Walking or climbing stairs? N N  Dressing or bathing? N N  Doing errands, shopping? N N  Preparing Food and eating ? N -  Using the Toilet? N -  In the past six months, have you accidently leaked urine? N -  Do you have problems with loss of bowel control? N -  Managing your Medications? N -  Managing your Finances? N -  Housekeeping or managing your Housekeeping? N -  Some recent data might be hidden    Patient Care Team: [name], DO as PCP - General (Family Medicine) Poggi, Marshall Cork, MD as Consulting Physician (Orthopedic Surgery) Lucas Mallow, MD as Consulting Physician (Urology)  Assessment:   This is a routine wellness examination for Rick [name].  Exercise Activities and Dietary recommendations Current Exercise  Habits: Home exercise routine, Type of exercise: walking, Time (Minutes): 30, Frequency (Times/Week): 7, Weekly Exercise (Minutes/Week): 210, Intensity: Mild, Exercise limited by: None identified  Goals   None     Fall Risk: Fall Risk  06/04/2019 08/27/2018 02/11/2018 02/05/2018 01/02/2018  Falls in the past year? 0 0 No No No  Number falls in past yr: 0 - - - -  Injury with Fall? 0 - - - -  Follow up - Falls evaluation completed - - -    FALL RISK PREVENTION PERTAINING TO THE HOME:  Any stairs in or around the home? Yes going into home If so, are there any without handrails? No   Home free of loose throw rugs in walkways, pet beds, electrical cords, etc? Yes  Adequate lighting in your home to reduce risk of falls? Yes   ASSISTIVE DEVICES UTILIZED TO PREVENT FALLS:  Life alert? No  Use of a cane, walker or w/c? No  Grab bars in the bathroom? No  Shower chair or bench in shower? No  Elevated toilet seat or a handicapped toilet? No   TIMED UP AND GO:  Unable to perform   Depression Screen PHQ 2/9 Scores 06/04/2019 01/02/2018 09/06/2017 12/07/2016  PHQ - 2 Score 1 2 0 0  PHQ- 9 Score - 8 3 -    Cognitive Function     6CIT Screen 06/04/2019 12/06/2017 12/07/2016  What Year? 0 points 0 points 0 points  What month? 0 points 0 points 0 points  What time? 0 points 0 points 3 points  Count back from 20 0 points 0 points 0 points  Months in reverse 0 points 0 points 0 points  Repeat phrase 0 points 4 points 4 points  Total Score 0 4 7    Immunization History  Administered Date(s) Administered  . Influenza Split 05/18/2014  . Influenza, High Dose Seasonal PF 05/11/2015    Qualifies for Shingles Vaccine? Yes  Zostavax completed n/a. Due for Shingrix. Education has been provided regarding the importance of this vaccine. Pt has been advised to call insurance company to determine out of pocket expense. Advised may also receive vaccine at local pharmacy or Health Dept. Verbalized  acceptance and understanding.  Tdap: Discussed need for TD/TDAP vaccine, patient verbalized understanding that this is not covered as a preventative with there insurance and to call the office if he develops any new skin injuries, ie: cuts, scrapes, bug bites, or open wounds.  Flu Vaccine: Due for Flu vaccine. Will get at next appt  Pneumococcal Vaccine: declined  Screening Tests Health Maintenance  Topic Date Due  . PNA vac Low Risk Adult (1 of 2 - PCV13) 08/13/2006  . INFLUENZA VACCINE  03/15/2019  . FOOT EXAM  05/29/2019  . TETANUS/TDAP  06/03/2020 (Originally 08/13/1960)  . HEMOGLOBIN A1C  10/11/2019  . OPHTHALMOLOGY EXAM  01/14/2020   Cancer Screenings:  Colorectal Screening: no longer required  Lung Cancer Screening: (Low Dose CT Chest recommended if Age 67-80 years, 30 pack-year currently smoking OR have quit w/in 15years.) does not qualify.     Additional Screening:  Hepatitis C Screening: does not qualify  Vision Screening: Recommended annual ophthalmology exams for early detection of glaucoma and other disorders of the eye. Is the patient up to date with their annual eye exam?  Yes  Who is the provider or what is the name of  the office in which the pt attends annual eye exams? [facility]   Dental Screening: Recommended annual dental exams for proper oral hygiene  Community Resource Referral:  CRR required this visit?  No        Plan:  I have personally reviewed and addressed the Medicare Annual Wellness questionnaire and have noted the following in the patient's chart:  A. Medical and social history B. Use of alcohol, tobacco or illicit drugs  C. Current medications and supplements D. Functional ability and status E.  Nutritional status F.  Physical activity G. Advance directives H. List of other physicians I.  Hospitalizations, surgeries, and ER visits in previous 12 months J.  [place] such as hearing and vision if needed, cognitive and  depression L. Referrals and appointments   In addition, I have reviewed and discussed with patient certain preventive protocols, quality metrics, and best practice recommendations. A written personalized care plan for preventive services as well as general preventive health recommendations were provided to patient.   Signed,   Bevelyn Ngo, LPN  07/62/2633 Nurse Health Advisor   Nurse Notes: none

## 2019-06-04 NOTE — Telephone Encounter (Signed)
AWV w/Dr. Wynetta Emery only

## 2019-06-04 NOTE — Patient Instructions (Signed)
[name] , Thank you for taking time to come for your Medicare Wellness Visit. I appreciate your ongoing commitment to your health goals. Please review the following plan we discussed and let me know if I can assist you in the future.   Screening recommendations/referrals: Colonoscopy: no longer required  Recommended yearly ophthalmology/optometry visit for glaucoma screening and checkup Recommended yearly dental visit for hygiene and checkup  Vaccinations: Influenza vaccine: will get in office at next office  Pneumococcal vaccine: declined Tdap vaccine: declined  Shingles vaccine: shingrix eligible   Advanced directives: Advance directive discussed with you today.  Conditions/risks identified: diabetic, discussed chronic care management team   Next appointment: Follow up in one year for your annual wellness visit.   Preventive Care 78 Years and Older, Male Preventive care refers to lifestyle choices and visits with your health care provider that can promote health and wellness. What does preventive care include?  A yearly physical exam. This is also called an annual well check.  Dental exams once or twice a year.  Routine eye exams. Ask your health care provider how often you should have your eyes checked.  Personal lifestyle choices, including:  Daily care of your teeth and gums.  Regular physical activity.  Eating a healthy diet.  Avoiding tobacco and drug use.  Limiting alcohol use.  Practicing safe sex.  Taking low doses of aspirin every day.  Taking vitamin and mineral supplements as recommended by your health care provider. What happens during an annual well check? The services and screenings done by your health care provider during your annual well check will depend on your age, overall health, lifestyle risk factors, and family history of disease. Counseling  Your health care provider may ask you questions about your:  Alcohol use.  Tobacco use.  Drug  use.  Emotional well-being.  Home and relationship well-being.  Sexual activity.  Eating habits.  History of falls.  Memory and ability to understand (cognition).  Work and work Statistician. Screening  You may have the following tests or measurements:  Height, weight, and BMI.  Blood pressure.  Lipid and cholesterol levels. These may be checked every 5 years, or more frequently if you are over 33 years old.  Skin check.  Lung cancer screening. You may have this screening every year starting at age 34 if you have a 30-pack-year history of smoking and currently smoke or have quit within the past 15 years.  Fecal occult blood test (FOBT) of the stool. You may have this test every year starting at age 66.  Flexible sigmoidoscopy or colonoscopy. You may have a sigmoidoscopy every 5 years or a colonoscopy every 10 years starting at age 46.  Prostate cancer screening. Recommendations will vary depending on your family history and other risks.  Hepatitis C blood test.  Hepatitis B blood test.  Sexually transmitted disease (STD) testing.  Diabetes screening. This is done by checking your blood sugar (glucose) after you have not eaten for a while (fasting). You may have this done every 1-3 years.  Abdominal aortic aneurysm (AAA) screening. You may need this if you are a current or former smoker.  Osteoporosis. You may be screened starting at age 110 if you are at high risk. Talk with your health care provider about your test results, treatment options, and if necessary, the need for more tests. Vaccines  Your health care provider may recommend certain vaccines, such as:  Influenza vaccine. This is recommended every year.  Tetanus, diphtheria, and  acellular pertussis (Tdap, Td) vaccine. You may need a Td booster every 10 years.  Zoster vaccine. You may need this after age 78.  Pneumococcal 13-valent conjugate (PCV13) vaccine. One dose is recommended after age 63.   Pneumococcal polysaccharide (PPSV23) vaccine. One dose is recommended after age 52. Talk to your health care provider about which screenings and vaccines you need and how often you need them. This information is not intended to replace advice given to you by your health care provider. Make sure you discuss any questions you have with your health care provider. Document Released: 08/27/2015 Document Revised: 04/19/2016 Document Reviewed: 06/01/2015 Elsevier Interactive Patient Education  2017 [place] Prevention in the Home Falls can cause injuries. They can happen to people of all ages. There are many things you can do to make your home safe and to help prevent falls. What can I do on the outside of my home?  Regularly fix the edges of walkways and driveways and fix any cracks.  Remove anything that might make you trip as you walk through a door, such as a raised step or threshold.  Trim any bushes or trees on the path to your home.  Use bright outdoor lighting.  Clear any walking paths of anything that might make someone trip, such as rocks or tools.  Regularly check to see if handrails are loose or broken. Make sure that both sides of any steps have handrails.  Any raised decks and porches should have guardrails on the edges.  Have any leaves, snow, or ice cleared regularly.  Use sand or salt on walking paths during winter.  Clean up any spills in your garage right away. This includes oil or grease spills. What can I do in the bathroom?  Use night lights.  Install grab bars by the toilet and in the tub and shower. Do not use towel bars as grab bars.  Use non-skid mats or decals in the tub or shower.  If you need to sit down in the shower, use a plastic, non-slip stool.  Keep the floor dry. Clean up any water that spills on the floor as soon as it happens.  Remove soap buildup in the tub or shower regularly.  Attach bath mats securely with double-sided non-slip  rug tape.  Do not have throw rugs and other things on the floor that can make you trip. What can I do in the bedroom?  Use night lights.  Make sure that you have a light by your bed that is easy to reach.  Do not use any sheets or blankets that are too big for your bed. They should not hang down onto the floor.  Have a firm chair that has side arms. You can use this for support while you get dressed.  Do not have throw rugs and other things on the floor that can make you trip. What can I do in the kitchen?  Clean up any spills right away.  Avoid walking on wet floors.  Keep items that you use a lot in easy-to-reach places.  If you need to reach something above you, use a strong step stool that has a grab bar.  Keep electrical cords out of the way.  Do not use floor polish or wax that makes floors slippery. If you must use wax, use non-skid floor wax.  Do not have throw rugs and other things on the floor that can make you trip. What can I do with my stairs?  Do not leave any items on the stairs.  Make sure that there are handrails on both sides of the stairs and use them. Fix handrails that are broken or loose. Make sure that handrails are as long as the stairways.  Check any carpeting to make sure that it is firmly attached to the stairs. Fix any carpet that is loose or worn.  Avoid having throw rugs at the top or bottom of the stairs. If you do have throw rugs, attach them to the floor with carpet tape.  Make sure that you have a light switch at the top of the stairs and the bottom of the stairs. If you do not have them, ask someone to add them for you. What else can I do to help prevent falls?  Wear shoes that:  Do not have high heels.  Have rubber bottoms.  Are comfortable and fit you well.  Are closed at the toe. Do not wear sandals.  If you use a stepladder:  Make sure that it is fully opened. Do not climb a closed stepladder.  Make sure that both sides of  the stepladder are locked into place.  Ask someone to hold it for you, if possible.  Clearly mark and make sure that you can see:  Any grab bars or handrails.  First and last steps.  Where the edge of each step is.  Use tools that help you move around (mobility aids) if they are needed. These include:  Canes.  Walkers.  Scooters.  Crutches.  Turn on the lights when you go into a dark area. Replace any light bulbs as soon as they burn out.  Set up your furniture so you have a clear path. Avoid moving your furniture around.  If any of your floors are uneven, fix them.  If there are any pets around you, be aware of where they are.  Review your medicines with your doctor. Some medicines can make you feel dizzy. This can increase your chance of falling. Ask your doctor what other things that you can do to help prevent falls. This information is not intended to replace advice given to you by your health care provider. Make sure you discuss any questions you have with your health care provider. Document Released: 05/27/2009 Document Revised: 01/06/2016 Document Reviewed: 09/04/2014 Elsevier Interactive Patient Education  2017 [employer].

## 2019-07-07 ENCOUNTER — Telehealth: Payer: Self-pay | Admitting: Family Medicine

## 2019-07-07 DIAGNOSIS — R300 Dysuria: Secondary | ICD-10-CM

## 2019-07-07 NOTE — Telephone Encounter (Signed)
Copied from [place] [phone]. Topic: General - Other >> Jul 07, 2019  7:30 AM Rick [name] wrote: reason for CRM: pt has an appt tomorrow and he would like to drop off urine sample today. Pt is having burning when urinating

## 2019-07-08 ENCOUNTER — Other Ambulatory Visit: Payer: Self-pay

## 2019-07-08 ENCOUNTER — Ambulatory Visit (INDEPENDENT_AMBULATORY_CARE_PROVIDER_SITE_OTHER): Payer: Medicare Other | Admitting: Family Medicine

## 2019-07-08 ENCOUNTER — Ambulatory Visit: Payer: Self-pay | Admitting: Pharmacist

## 2019-07-08 ENCOUNTER — Encounter: Payer: Self-pay | Admitting: Family Medicine

## 2019-07-08 VITALS — BP 102/71 | HR 84

## 2019-07-08 DIAGNOSIS — N401 Enlarged prostate with lower urinary tract symptoms: Secondary | ICD-10-CM

## 2019-07-08 DIAGNOSIS — N390 Urinary tract infection, site not specified: Secondary | ICD-10-CM

## 2019-07-08 DIAGNOSIS — I739 Peripheral vascular disease, unspecified: Secondary | ICD-10-CM

## 2019-07-08 DIAGNOSIS — Z0000 Encounter for general adult medical examination without abnormal findings: Secondary | ICD-10-CM | POA: Diagnosis not present

## 2019-07-08 DIAGNOSIS — E1142 Type 2 diabetes mellitus with diabetic polyneuropathy: Secondary | ICD-10-CM | POA: Diagnosis not present

## 2019-07-08 DIAGNOSIS — F3171 Bipolar disorder, in partial remission, most recent episode hypomanic: Secondary | ICD-10-CM

## 2019-07-08 DIAGNOSIS — E782 Mixed hyperlipidemia: Secondary | ICD-10-CM

## 2019-07-08 DIAGNOSIS — I10 Essential (primary) hypertension: Secondary | ICD-10-CM | POA: Diagnosis not present

## 2019-07-08 DIAGNOSIS — R3914 Feeling of incomplete bladder emptying: Secondary | ICD-10-CM

## 2019-07-08 MED ORDER — FLUTICASONE PROPIONATE 50 MCG/ACT NA SUSP: 2.0000 | Freq: Two times a day (BID) | NASAL | 6 refills | Status: DC

## 2019-07-08 NOTE — Assessment & Plan Note (Signed)
Under good control on current regimen. Continue current regimen. Continue to monitor. Call with any concerns. Refills given. Labs drawn today.   

## 2019-07-08 NOTE — Patient Instructions (Addendum)
Rick [name],   It was great to meet you today!  We will hold off on applying for insulin patient assistance until January, since you have enough of a supply to last you.   In January, we will apply for Basaglar (insulin glargine- same as Lantus, just a different company), since they don't require you to spend a certain amount of money on copays before qualifying.   There will be an application for you to sign, and I will work with Dr. Wynetta Emery for the provider portion. We will need proof of your income. You should have received/will receive a paper from the [place] that tells you what you will get each month. We will need a copy of that.   We will also discuss something called Medicare Low Income Subsidy/Extra Help. You may qualify based on your income.   Feel free to call me with any questions or concerns in the meantime.  Visit Information  Goals Addressed            This Visit's Progress     Patient Stated   . PharmD "I want to take care of my diabetes" (pt-stated)       Current Barriers:  . Diabetes: controlled; most recent today A1c 6.2% o Reports cost concerns w/ insulin therapy . Current antihyperglycemic regimen: metformin 1000 mg BID, Lantus 15 units daily . Denies episodes of hypoglycemia  . Current meal patterns: o Is aware of things that can raise his blood sugar . Current exercise: Walk, working around the house . Current blood glucose readings: checking sugars "as needed"; only if he eats high carbohydrate meals OR if his sugars  o 120-140s up to 230-250s . Cardiovascular risk reduction: o Current hypertensive regimen: prescribed HCTZ, enalapril, and carvedilol, though per refill hx report, does not appear that patient has filled HCTZ or enalapril lately o Current hyperlipidemia regimen: lovastatin 40 mg dialy;   Pharmacist Clinical Goal(s):  [name] Over the next 90 days, patient will work with PharmD and primary care provider to address optimized  medication managemnet  Interventions: . Reviewed risks of hypoglycemia. Discussed avoiding BG <80. Discussed that risk of hypoglycemia unawareness increases w/ age. Recommended patient check BG more frequently than PRN. Will reiterate moving forward.  . Discussed process for patient assistance. As patient has sufficient supply of Lantus at this time, will defer applying for assistance until January. Patient may qualify for Extra Help.   Patient Self Care Activities:  . Patient will check blood glucose daily , document, and provide at future appointments . Patient will take medications as prescribed . Patient will report any questions or concerns to provider   Initial goal documentation        The patient verbalized understanding of instructions provided today and declined a print copy of patient instruction materials.   Plan: - Scheduled follow up phone call in ~6 weeks  Catie Darnelle Maffucci, PharmD, [place] [phone]

## 2019-07-08 NOTE — Assessment & Plan Note (Signed)
Will keep BP, cholesterol and sugars under good control. Continue to monitor.  

## 2019-07-08 NOTE — Assessment & Plan Note (Signed)
Under good control on current regimen. Continue current regimen. Continue to monitor. Call with any concerns. Refills given.   

## 2019-07-08 NOTE — Chronic Care Management (AMB) (Signed)
Chronic Care Management   Note  07/08/2019 Name: Rick [name] MRN: [id number] DOB: 1940/11/08   Subjective:  Rick [name] is a 78 y.o. year old male who is a primary care patient of Rick Roys, DO. The CCM team was consulted for assistance with chronic disease management and care coordination needs.    Met with patient face to face for medication management review.    Rick [name] was given information about Chronic Care Management services today including:  1. CCM service includes personalized support from designated clinical staff supervised by his physician, including individualized plan of care and coordination with other care providers 2. 24/7 contact phone numbers for assistance for urgent and routine care needs. 3. Service will only be billed when office clinical staff spend 20 minutes or more in a month to coordinate care. 4. Only one practitioner may furnish and bill the service in a calendar month. 5. The patient may stop CCM services at any time (effective at the end of the month) by phone call to the office staff. 6. The patient will be responsible for cost sharing (co-pay) of up to 20% of the service fee (after annual deductible is met).  Patient agreed to services and verbal consent obtained.   Review of patient status, including review of consultants reports, laboratory and other test data, was performed as part of comprehensive evaluation and provision of chronic care management services.   Objective:  Lab Results  Component Value Date   CREATININE 0.98 04/10/2019   CREATININE 0.93 12/31/2018   CREATININE 1.00 12/11/2018    Lab Results  Component Value Date   HGBA1C 5.6 04/10/2019       Component Value Date/Time   CHOL 137 04/10/2019 1439   CHOL 146 06/06/2016 1314   TRIG 114 04/10/2019 1439   TRIG 177 (H) 06/06/2016 1314   HDL 44 04/10/2019 1439   CHOLHDL 2.7 05/07/2018 1546   VLDL 35 (H) 06/06/2016 1314   LDLCALC 70 04/10/2019 1439     Clinical ASCVD: No  The 10-year ASCVD risk score Rick [name] DC Jr., et al., 2013) is: 36.8%   Values used to calculate the score:     Age: 84 years     Sex: Male     Is Non-Hispanic African American: No     Diabetic: Yes     Tobacco smoker: No     Systolic Blood Pressure: 387 mmHg     Is BP treated: Yes     HDL Cholesterol: 44 mg/dL     Total Cholesterol: 137 mg/dL    BP Readings from Last 3 Encounters:  07/08/19 102/71  04/10/19 103/62  01/03/19 120/72    Allergies  Allergen Reactions  . Blood-Group Specific Substance   . Haloperidol Other (See Comments)    Unknown  . Sulfa Antibiotics Other (See Comments)    Unknown    Medications Reviewed Today    Reviewed by Rick [name], CMA (Certified Medical Assistant) on 07/08/19 at [place] [facility] Status: <None>  Medication Order Taking? Sig Documenting Provider Last Dose Status Informant  Blood Glucose Monitoring Suppl (ONE TOUCH ULTRA SYSTEM KIT) w/Device KIT [id number] No 1 kit by Does not apply route once. Kathrine Haddock, NP Taking Active Self  carbidopa-levodopa (SINEMET IR) 25-100 MG tablet [id number] No Take 1 tablet by mouth 4 (four) times daily.  [provider] Taking Active Self  carvedilol (COREG) 6.25 MG tablet [id number] No Take 1 tablet (6.25 mg total) by mouth 2 (two)  times daily with a meal. Wynetta Emery, Megan P, DO Taking Active   diclofenac sodium (VOLTAREN) 1 % GEL [id number] No Apply 2 g topically 4 (four) times daily.  Patient taking differently: Apply 2 g topically 4 (four) times daily as needed (lower back pain.).    [place] [employer], [place] Taking Active Self  enalapril (VASOTEC) 10 MG tablet [id number] No Take 1 tablet (10 mg total) by mouth daily. Johnson, Megan P, DO Taking Active   fluticasone (FLONASE) 50 MCG/ACT nasal spray [id number] No Place 2 sprays into both nostrils 2 (two) times daily. [place] Liter P, DO Taking Active Self  gabapentin (NEURONTIN) 300 MG capsule [id number] No Take 2 capsules (600  mg total) by mouth 2 (two) times daily. Johnson, Megan P, DO Taking Active   hydrochlorothiazide (HYDRODIURIL) 25 MG tablet [id number] No TAKE ONE (1) TABLET EACH DAY Johnson, [place], DO Taking Active   Insulin Glargine (LANTUS SOLOSTAR) 100 UNIT/ML Solostar Pen [id number] No Inject 15 Units into the skin daily at 10 pm. [place] Liter P, DO Taking Active   Insulin Pen Needle (ULTICARE PEN NEEDLES) 29G X 12MM MISC [id number] No USE AS DIRECTED Rick Roys, DO Taking Active Self  Lancets [facility] ULTRASOFT) lancets [id number] No TEST BLOOD SUGAR THREE TIMES DAILY Wynetta Emery, Megan P, DO Taking Active   lovastatin (MEVACOR) 40 MG tablet [id number] No Take 1 tablet (40 mg total) by mouth daily. [place] Liter P, DO Taking Active   Menthol, Topical Analgesic, [facility] EX) [id number] No Apply 1 application topically 3 (three) times daily as needed (lower back pain.). [provider] Taking Active Self  metFORMIN (GLUCOPHAGE) 1000 MG tablet [id number] No TAKE 1 TABLET BY MOUTH TWICE A DAY WITH A MEAL Johnson, Megan P, DO Taking Active   mirtazapine (REMERON) 15 MG tablet [id number] No Take 1 tablet (15 mg total) by mouth at bedtime. [place] Liter P, DO Taking Active   ONETOUCH ULTRA test strip [id number] No TEST BLOOD SUGAR THREE TIMES DAILY. [place] Liter P, DO Taking Active   silodosin (RAPAFLO) 8 MG CAPS capsule [id number] No Take 1 capsule (8 mg total) by mouth daily with breakfast. Rick Roys, DO Taking Active Self  traZODone (DESYREL) 100 MG tablet [id number] No Take 1 tablet (100 mg total) by mouth at bedtime. Johnson, Megan P, DO Taking Active   valACYclovir (VALTREX) 1000 MG tablet [id number] No Take 1 tablet (1,000 mg total) by mouth daily as needed. Rick Roys, DO Taking Active            Assessment:   Goals Addressed            This Visit's Progress     Patient Stated   . PharmD "I want to take care of my diabetes" (pt-stated)       Current Barriers:  . Diabetes:  controlled; most recent today A1c 6.2% o Reports cost concerns w/ insulin therapy . Current antihyperglycemic regimen: metformin 1000 mg BID, Lantus 15 units daily . Denies episodes of hypoglycemia  . Current meal patterns: o Is aware of things that can raise his blood sugar . Current exercise: Walk, working around the house . Current blood glucose readings: checking sugars "as needed"; only if he eats high carbohydrate meals OR if his sugars  o 120-140s up to 230-250s . Cardiovascular risk reduction: o Current hypertensive regimen: prescribed HCTZ, enalapril, and carvedilol, though per refill hx report, does not appear that patient has filled HCTZ or enalapril lately o Current hyperlipidemia regimen: lovastatin  40 mg dialy;   Pharmacist Clinical Goal(s):  [name] Over the next 90 days, patient will work with PharmD and primary care provider to address optimized medication managemnet  Interventions: . Reviewed risks of hypoglycemia. Discussed avoiding BG <80. Discussed that risk of hypoglycemia unawareness increases w/ age. Recommended patient check BG more frequently than PRN. Will reiterate moving forward.  . Discussed process for patient assistance. As patient has sufficient supply of Lantus at this time, will defer applying for assistance until January. Patient may qualify for Extra Help.   Patient Self Care Activities:  . Patient will check blood glucose daily , document, and provide at future appointments . Patient will take medications as prescribed . Patient will report any questions or concerns to provider   Initial goal documentation        Plan: - Scheduled follow up phone call in ~6 weeks  Catie Darnelle Maffucci, PharmD, [place] [phone]

## 2019-07-08 NOTE — Progress Notes (Signed)
BP 102/71   Pulse 84   SpO2 98%    Subjective:    Patient ID: Rick [name], male    DOB: 1940-12-02, 78 y.o.   MRN: [id number]  HPI: Rick [name] is a 78 y.o. male presenting on 07/08/2019 for comprehensive medical examination. Current medical complaints include:  DIABETES Hypoglycemic episodes:no Polydipsia/polyuria: no Visual disturbance: no Chest pain: no Paresthesias: no Glucose Monitoring: yes  Accucheck frequency: Daily Taking Insulin?: yes Blood Pressure Monitoring: not checking Retinal Examination: Up to Date Foot Exam: done today Diabetic Education: Completed Pneumovax: Refused Influenza: Refused Aspirin: no  HYPERTENSION / HYPERLIPIDEMIA Satisfied with current treatment? yes Duration of hypertension: chronic BP monitoring frequency: not checking BP medication side effects: no Past BP meds: carvedilol, enalapril, HCTZ Duration of hyperlipidemia: chronic Cholesterol medication side effects: no Cholesterol supplements: none Past cholesterol medications: lovastatin Medication compliance: excellent compliance Aspirin: yes Recent stressors: no Recurrent headaches: no Visual changes: no Palpitations: no Dyspnea: no Chest pain: no Lower extremity edema: no Dizzy/lightheaded: no  URINARY SYMPTOMS Duration: chronic Dysuria: yes Urinary frequency: yes Urgency: yes Small volume voids: yes Symptom severity: mild Urinary incontinence: no Foul odor: yes Hematuria: no Abdominal pain: no Back pain: no Suprapubic pain/pressure: no Flank pain: no Fever:  no Vomiting: no Relief with cranberry juice: no Relief with pyridium: no Status: stable Previous urinary tract infection: yes Recurrent urinary tract infection: yes Penile discharge: no Treatments attempted: antibiotics and increasing fluids   Interim Problems from his last visit: no  Depression Screen done today and results listed below:  Depression screen [facility] 2/9 06/04/2019 01/02/2018  09/06/2017 12/07/2016 09/05/2016  Decreased Interest 0 1 0 0 0  Down, Depressed, Hopeless 1 1 0 0 0  PHQ - 2 Score 1 2 0 0 0  Altered sleeping - 3 0 - -  Tired, decreased energy - 1 3 - -  Change in appetite - 0 0 - -  Feeling bad or failure about yourself  - 0 0 - -  Trouble concentrating - 0 0 - -  Moving slowly or fidgety/restless - 1 0 - -  Suicidal thoughts - 1 0 - -  PHQ-9 Score - 8 3 - -    Past Medical History:  Past Medical History:  Diagnosis Date  . Anxiety   . Bipolar disorder ([place])   . Bladder cancer ([place])   . BPH (benign prostatic hypertrophy)   . Chest pain, atypical 06/23/2015  . Closed trimalleolar fracture of left ankle 10/01/2015  . Cystitis    hx of   . Depression   . Diabetes mellitus without complication ([place])    type 2   . Difficult or painful urination 10/31/2012  . Dysrhythmia   . Ear problem 03/24/2015  . Gangrenous appendicitis   . H/O urinary disorder 03/27/2013  . Herpes genitalis in men   . Hyperlipidemia   . Hypertension   . Insomnia   . Kidney stones   . Left hand pain 05/18/2015  . Mass of arm 02/10/2015  . Peripheral neuropathy   . Skin cancer   . UD (urethral discharge) 10/31/2012  . Urinary tract infection    hx of     Surgical History:  Past Surgical History:  Procedure Laterality Date  . APPENDECTOMY     RUPTURED  . BLADDER SURGERY    . CHOLECYSTECTOMY    . COLOSTOMY     AND LATER CLOSURE  . CYSTOSCOPY/URETEROSCOPY/HOLMIUM LASER/STENT PLACEMENT Left 03/22/2018   Procedure: CYSTOSCOPY/LEFT URETEROSCOPY/LEFT  RETROGRADE PYELOGRAM;  Surgeon: Lucas Mallow, MD;  Location: [facility];  Service: Urology;  Laterality: Left;  . EPIDIDYMECTOMY N/A 01/03/2019   Procedure: RIGHT EPIDIDYMAL CYST REMOVAL VERSES EPIDIDMECTOMY;  Surgeon: Lucas Mallow, MD;  Location: [facility];  Service: Urology;  Laterality: N/A;  . FRACTURE SURGERY    . HERNIA REPAIR    . INNER EAR SURGERY Left   . ORIF ANKLE FRACTURE Left 09/27/2015   Procedure: OPEN  REDUCTION INTERNAL FIXATION (ORIF) ANKLE FRACTURE;  Surgeon: Corky Mull, MD;  Location: [facility];  Service: Orthopedics;  Laterality: Left;  . SKIN CANCER EXCISION    . TRANSURETHRAL RESECTION OF PROSTATE N/A 03/22/2018   Procedure: TRANSURETHRAL RESECTION OF THE PROSTATE (TURP);  Surgeon: Lucas Mallow, MD;  Location: [facility];  Service: Urology;  Laterality: N/A;  . VENTRAL HERNIA REPAIR N/A 01/14/2016   Procedure: HERNIA REPAIR VENTRAL ADULT;  Surgeon: Leonie Green, MD;  Location: [facility];  Service: General;  Laterality: N/A;    Medications:  Current Outpatient Medications on File Prior to Visit  Medication Sig  . Blood Glucose Monitoring Suppl (ONE TOUCH ULTRA SYSTEM KIT) w/Device KIT 1 kit by Does not apply route once.  . carbidopa-levodopa (SINEMET IR) 25-100 MG tablet Take 1 tablet by mouth 4 (four) times daily.   . carvedilol (COREG) 6.25 MG tablet Take 1 tablet (6.25 mg total) by mouth 2 (two) times daily with a meal.  . diclofenac sodium (VOLTAREN) 1 % GEL Apply 2 g topically 4 (four) times daily. (Patient taking differently: Apply 2 g topically 4 (four) times daily as needed (lower back pain.). )  . enalapril (VASOTEC) 10 MG tablet Take 1 tablet (10 mg total) by mouth daily.  [name] gabapentin (NEURONTIN) 300 MG capsule Take 2 capsules (600 mg total) by mouth 2 (two) times daily.  . hydrochlorothiazide (HYDRODIURIL) 25 MG tablet TAKE ONE (1) TABLET EACH DAY  . Insulin Glargine (LANTUS SOLOSTAR) 100 UNIT/ML Solostar Pen Inject 15 Units into the skin daily at 10 pm.  . Insulin Pen Needle (ULTICARE PEN NEEDLES) 29G X 12MM MISC USE AS DIRECTED  . Lancets (ONETOUCH ULTRASOFT) lancets TEST BLOOD SUGAR THREE TIMES DAILY  . lovastatin (MEVACOR) 40 MG tablet Take 1 tablet (40 mg total) by mouth daily.  . Menthol, Topical Analgesic, (BENGAY EX) Apply 1 application topically 3 (three) times daily as needed (lower back pain.).  [name] metFORMIN (GLUCOPHAGE) 1000 MG tablet TAKE 1 TABLET BY MOUTH  TWICE A DAY WITH A MEAL  . mirtazapine (REMERON) 15 MG tablet Take 1 tablet (15 mg total) by mouth at bedtime.  Glory Rosebush ULTRA test strip TEST BLOOD SUGAR THREE TIMES DAILY.  . silodosin (RAPAFLO) 8 MG CAPS capsule Take 1 capsule (8 mg total) by mouth daily with breakfast.  . traZODone (DESYREL) 100 MG tablet Take 1 tablet (100 mg total) by mouth at bedtime.  . valACYclovir (VALTREX) 1000 MG tablet Take 1 tablet (1,000 mg total) by mouth daily as needed.   No current facility-administered medications on file prior to visit.     Allergies:  Allergies  Allergen Reactions  . Blood-Group Specific Substance   . Haloperidol Other (See Comments)    Unknown  . Sulfa Antibiotics Other (See Comments)    Unknown    Social History:  Social History   Socioeconomic History  . Marital status: Divorced    Spouse name: Not on file  . Number of children: Not on file  .  Years of education: Not on file  . Highest education level: Not on file  Occupational History  . Not on file  Social Needs  . Financial resource strain: Not on file  . Food insecurity    Worry: Not on file    Inability: Not on file  . Transportation needs    Medical: Not on file    Non-medical: Not on file  Tobacco Use  . Smoking status: Former Smoker    Packs/day: 1.50    Years: 15.00    Pack years: 22.50    Types: Cigarettes    Start date: 08/14/1994    Quit date: 03/23/2005    Years since quitting: 14.3  . Smokeless tobacco: Former Systems developer    Types: Snuff, Sarina Ser    Quit date: 03/23/2010  Substance and Sexual Activity  . Alcohol use: No    Alcohol/week: 0.0 standard drinks  . Drug use: No  . Sexual activity: Never  Lifestyle  . Physical activity    Days per week: Not on file    Minutes per session: Not on file  . Stress: Not on file  Relationships  . Social Herbalist on phone: Not on file    Gets together: Not on file    Attends religious service: Not on file    Active member of club or  organization: Not on file    Attends meetings of clubs or organizations: Not on file    Relationship status: Not on file  . Intimate partner violence    Fear of current or ex partner: Not on file    Emotionally abused: Not on file    Physically abused: Not on file    Forced sexual activity: Not on file  Other Topics Concern  . Not on file  Social History Narrative  . Not on file   Social History   Tobacco Use  Smoking Status Former Smoker  . Packs/day: 1.50  . Years: 15.00  . Pack years: 22.50  . Types: Cigarettes  . Start date: 08/14/1994  . Quit date: 03/23/2005  . Years since quitting: 14.3  Smokeless Tobacco Former Systems developer  . Types: Snuff, Chew  . Quit date: 03/23/2010   Social History   Substance and Sexual Activity  Alcohol Use No  . Alcohol/week: 0.0 standard drinks    Family History:  Family History  Problem Relation Age of Onset  . Transient ischemic attack Mother   . Diabetes Father   . Cancer Brother     Past medical history, surgical history, medications, allergies, family history and social history reviewed with patient today and changes made to appropriate areas of the chart.   Review of Systems  Constitutional: Negative.   HENT: Positive for hearing loss. Negative for congestion, ear discharge, ear pain, nosebleeds, sinus pain, sore throat and tinnitus.   Eyes: Negative.   Respiratory: Negative.  Negative for stridor.   Cardiovascular: Negative.   Gastrointestinal: Positive for heartburn. Negative for abdominal pain, blood in stool, constipation, diarrhea, melena, nausea and vomiting.  Genitourinary: Positive for dysuria, frequency and urgency. Negative for flank pain and hematuria.  Musculoskeletal: Negative.   Skin: Negative.   Neurological: Positive for tremors. Negative for dizziness, tingling, sensory change, speech change, focal weakness, seizures, loss of consciousness, weakness and headaches.  Endo/Heme/Allergies: Negative.    Psychiatric/Behavioral: Negative.     All other ROS negative except what is listed above and in the HPI.      Objective:  BP 102/71   Pulse 84   SpO2 98%   Wt Readings from Last 3 Encounters:  01/03/19 169 lb 9 oz (76.9 kg)  12/31/18 169 lb 9 oz (76.9 kg)  10/24/18 165 lb 1 oz (74.9 kg)    Physical Exam Vitals signs and nursing note reviewed.  Constitutional:      General: He is not in acute distress.    Appearance: Normal appearance. He is not ill-appearing, toxic-appearing or diaphoretic.  HENT:     Head: Normocephalic and atraumatic.     Right Ear: Tympanic membrane, ear canal and external ear normal. There is no impacted cerumen.     Left Ear: Tympanic membrane, ear canal and external ear normal. There is no impacted cerumen.     Nose: Nose normal. No congestion or rhinorrhea.     Mouth/Throat:     Mouth: Mucous membranes are moist.     Pharynx: Oropharynx is clear. No oropharyngeal exudate or posterior oropharyngeal erythema.  Eyes:     General: No scleral icterus.       Right eye: No discharge.        Left eye: No discharge.     Extraocular Movements: Extraocular movements intact.     Conjunctiva/sclera: Conjunctivae normal.     Pupils: Pupils are equal, round, and reactive to light.  Neck:     Musculoskeletal: Normal range of motion and neck supple. No neck rigidity or muscular tenderness.     Vascular: No carotid bruit.  Cardiovascular:     Rate and Rhythm: Normal rate and regular rhythm.     Pulses: Normal pulses.     Heart sounds: No murmur. No friction rub. No gallop.   Pulmonary:     Effort: Pulmonary effort is normal. No respiratory distress.     Breath sounds: Normal breath sounds. No stridor. No wheezing, rhonchi or rales.  Chest:     Chest wall: No tenderness.  Abdominal:     General: Abdomen is flat. Bowel sounds are normal. There is no distension.     Palpations: Abdomen is soft. There is no mass.     Tenderness: There is no abdominal  tenderness. There is no right CVA tenderness, left CVA tenderness, guarding or rebound.     Hernia: No hernia is present.  Genitourinary:    Comments: Genital exam deferred with shared decision making Musculoskeletal:        General: No swelling, tenderness, deformity or signs of injury.     Right lower leg: No edema.     Left lower leg: No edema.  Lymphadenopathy:     Cervical: No cervical adenopathy.  Skin:    General: Skin is warm and dry.     Capillary Refill: Capillary refill takes less than 2 seconds.     Coloration: Skin is not jaundiced or pale.     Findings: No bruising, erythema, lesion or rash.  Neurological:     General: No focal deficit present.     Mental Status: He is alert and oriented to person, place, and time.     Cranial Nerves: No cranial nerve deficit.     Sensory: No sensory deficit.     Motor: No weakness.     Coordination: Coordination normal.     Gait: Gait normal.     Deep Tendon Reflexes: Reflexes normal.  Psychiatric:        Mood and Affect: Mood normal.        Behavior: Behavior normal.  Thought Content: Thought content normal.        Judgment: Judgment normal.     Results for orders placed or performed in visit on 05/16/19  Microscopic Examination   URINE  Result Value Ref Range   WBC, UA 0-5 0 - 5 /hpf   RBC None seen 0 - 2 /hpf   Epithelial Cells (non renal) 0-10 0 - 10 /hpf   Bacteria, UA None seen None seen/Few  Urine Culture, Reflex   URINE  Result Value Ref Range   Urine Culture, Routine Final report    Organism ID, Bacteria No growth   UA/M w/rflx Culture, Routine   Specimen: Urine   URINE  Result Value Ref Range   Specific Gravity, UA 1.010 1.005 - 1.030   pH, UA 5.0 5.0 - 7.5   Color, UA Yellow Yellow   Appearance Ur Clear Clear   Leukocytes,UA Trace (A) Negative   Protein,UA Negative Negative/Trace   Glucose, UA Negative Negative   Ketones, UA Negative Negative   RBC, UA Negative Negative   Bilirubin, UA Negative  Negative   Urobilinogen, Ur 0.2 0.2 - 1.0 mg/dL   Nitrite, UA Negative Negative   Microscopic Examination See below:    Urinalysis Reflex Comment       Assessment & Plan:   Problem List Items Addressed This Visit      Cardiovascular and Mediastinum   Hypertension    Under good control on current regimen. Continue current regimen. Continue to monitor. Call with any concerns. Refills given. Labs drawn today.        Relevant Orders   CBC with Differential OUT   Comp Met (CMET)   Microalbumin, Urine Waived   TSH   PVD (peripheral vascular disease) (HCC)    Will keep BP, cholesterol and sugars under good control. Continue to monitor.         Endocrine   Diabetic peripheral neuropathy associated with type 2 diabetes mellitus ([place])    Under good control with A1c of 6.2- continue current regimen. Continue to monitor. Call with any concerns.       Relevant Orders   Bayer DCA Hb A1c Waived   CBC with Differential OUT   Comp Met (CMET)   Microalbumin, Urine Waived     Genitourinary   Benign localized prostatic hyperplasia with lower urinary tract symptoms (LUTS)    Continue to follow with urology. Call with any concerns. PSA checked today.      Relevant Orders   CBC with Differential OUT   Comp Met (CMET)   PSA   Recurrent UTI    Trace leuks on exam today. Await culture. Call with any concerns.       Relevant Orders   CBC with Differential OUT   Comp Met (CMET)   UA/M w/rflx Culture, Routine   BPH (benign prostatic hyperplasia)    Continue to follow with urology. Call with any concerns. PSA checked today.        Other   Affective bipolar disorder ([place])    Under good control on current regimen. Continue current regimen. Continue to monitor. Call with any concerns. Refills given.        Relevant Orders   CBC with Differential OUT   Comp Met (CMET)   Hyperlipidemia    Under good control on current regimen. Continue current regimen. Continue to monitor. Call  with any concerns. Refills given. Labs drawn today.       Relevant Orders   CBC with  Differential OUT   Comp Met (CMET)   Lipid Panel w/o Chol/HDL Ratio OUT    Other Visit Diagnoses    Routine general medical examination at a health care facility    -  Primary      LABORATORY TESTING:  Health maintenance labs ordered today as discussed above.   The natural history of prostate cancer and ongoing controversy regarding screening and potential treatment outcomes of prostate cancer has been discussed with the patient. The meaning of a false positive PSA and a false negative PSA has been discussed. He indicates understanding of the limitations of this screening test and wishes to proceed with screening PSA testing.   IMMUNIZATIONS:   - Tdap: Tetanus vaccination status reviewed: Refused. - Influenza: Refused - Pneumovax: Refused - Prevnar: Refused  PATIENT COUNSELING:    Sexuality: Discussed sexually transmitted diseases, partner selection, use of condoms, avoidance of unintended pregnancy  and contraceptive alternatives.   Advised to avoid cigarette smoking.  I discussed with the patient that most people either abstain from alcohol or drink within safe limits (<=14/week and <=4 drinks/occasion for males, <=7/weeks and <= 3 drinks/occasion for females) and that the risk for alcohol disorders and other health effects rises proportionally with the number of drinks per week and how often a drinker exceeds daily limits.  Discussed cessation/primary prevention of drug use and availability of treatment for abuse.   Diet: Encouraged to adjust caloric intake to maintain  or achieve ideal body weight, to reduce intake of dietary saturated fat and total fat, to limit sodium intake by avoiding high sodium foods and not adding table salt, and to maintain adequate dietary potassium and calcium preferably from fresh fruits, vegetables, and low-fat dairy products.    stressed the importance of regular  exercise  Injury prevention: Discussed safety belts, safety helmets, smoke detector, smoking near bedding or upholstery.   Dental health: Discussed importance of regular tooth brushing, flossing, and dental visits.   Follow up plan: NEXT PREVENTATIVE PHYSICAL DUE IN 1 YEAR. Return in about 3 months (around 10/08/2019).

## 2019-07-08 NOTE — Assessment & Plan Note (Signed)
Continue to follow with urology. Call with any concerns. PSA checked today.

## 2019-07-08 NOTE — Assessment & Plan Note (Signed)
Trace leuks on exam today. Await culture. Call with any concerns.

## 2019-07-08 NOTE — Assessment & Plan Note (Signed)
Under good control with A1c of 6.2- continue current regimen. Continue to monitor. Call with any concerns.  

## 2019-07-09 ENCOUNTER — Encounter: Payer: Self-pay | Admitting: Family Medicine

## 2019-07-09 LAB — CBC WITH DIFFERENTIAL/PLATELET
Basophils Absolute: 0.1 10*3/uL (ref 0.0–0.2)
Basos: 1 %
EOS (ABSOLUTE): 0.3 10*3/uL (ref 0.0–0.4)
Eos: 3 %
Hematocrit: 43 % (ref 37.5–51.0)
Hemoglobin: 14.9 g/dL (ref 13.0–17.7)
Immature Grans (Abs): 0.1 10*3/uL (ref 0.0–0.1)
Immature Granulocytes: 1 %
Lymphocytes Absolute: 2.4 10*3/uL (ref 0.7–3.1)
Lymphs: 31 %
MCH: 29.2 pg (ref 26.6–33.0)
MCHC: 34.7 g/dL (ref 31.5–35.7)
MCV: 84 fL (ref 79–97)
Monocytes Absolute: 0.6 10*3/uL (ref 0.1–0.9)
Monocytes: 8 %
Neutrophils Absolute: 4.5 10*3/uL (ref 1.4–7.0)
Neutrophils: 56 %
Platelets: 239 10*3/uL (ref 150–450)
RBC: 5.11 x10E6/uL (ref 4.14–5.80)
RDW: 13.7 % (ref 11.6–15.4)
WBC: 7.9 10*3/uL (ref 3.4–10.8)

## 2019-07-09 LAB — LIPID PANEL W/O CHOL/HDL RATIO
Cholesterol, Total: 179 mg/dL (ref 100–199)
HDL: 43 mg/dL (ref >39)
LDL Chol Calc (NIH): 111 mg/dL — ABNORMAL HIGH (ref 0–99)
Triglycerides: 143 mg/dL (ref 0–149)
VLDL Cholesterol Cal: 25 mg/dL (ref 5–40)

## 2019-07-09 LAB — COMPREHENSIVE METABOLIC PANEL
ALT: 8 IU/L (ref 0–44)
AST: 18 IU/L (ref 0–40)
Albumin/Globulin Ratio: 2 (ref 1.2–2.2)
Albumin: 4.7 g/dL (ref 3.7–4.7)
Alkaline Phosphatase: 70 IU/L (ref 39–117)
BUN/Creatinine Ratio: 21 (ref 10–24)
BUN: 21 mg/dL (ref 8–27)
Bilirubin Total: 0.7 mg/dL (ref 0.0–1.2)
CO2: 21 mmol/L (ref 20–29)
Calcium: 9.9 mg/dL (ref 8.6–10.2)
Chloride: 102 mmol/L (ref 96–106)
Creatinine, Ser: 1 mg/dL (ref 0.76–1.27)
GFR calc Af Amer: 84 mL/min/{1.73_m2} (ref >59)
GFR calc non Af Amer: 72 mL/min/{1.73_m2} (ref >59)
Globulin, Total: 2.4 g/dL (ref 1.5–4.5)
Glucose: 88 mg/dL (ref 65–99)
Potassium: 4.3 mmol/L (ref 3.5–5.2)
Sodium: 139 mmol/L (ref 134–144)
Total Protein: 7.1 g/dL (ref 6.0–8.5)

## 2019-07-09 LAB — TSH: TSH: 2.15 u[IU]/mL (ref 0.450–4.500)

## 2019-07-09 LAB — PSA: Prostate Specific Ag, Serum: 1.3 ng/mL (ref 0.0–4.0)

## 2019-07-12 LAB — URINE CULTURE, REFLEX

## 2019-07-12 LAB — UA/M W/RFLX CULTURE, ROUTINE
Bilirubin, UA: NEGATIVE
Glucose, UA: NEGATIVE
Ketones, UA: NEGATIVE
Nitrite, UA: NEGATIVE
Protein,UA: NEGATIVE
RBC, UA: NEGATIVE
Specific Gravity, UA: 1.015 (ref 1.005–1.030)
Urobilinogen, Ur: 0.2 mg/dL (ref 0.2–1.0)
pH, UA: 5 (ref 5.0–7.5)

## 2019-07-12 LAB — MICROSCOPIC EXAMINATION: RBC, Urine: NONE SEEN /hpf (ref 0–2)

## 2019-07-12 LAB — MICROALBUMIN, URINE WAIVED
Creatinine, Urine Waived: 50 mg/dL (ref 10–300)
Microalb, Ur Waived: 10 mg/L (ref 0–19)

## 2019-07-12 LAB — BAYER DCA HB A1C WAIVED: HB A1C (BAYER DCA - WAIVED): 6.2 % (ref <7.0)

## 2019-07-14 ENCOUNTER — Ambulatory Visit: Payer: Self-pay

## 2019-07-14 ENCOUNTER — Telehealth: Payer: Self-pay | Admitting: Family Medicine

## 2019-07-14 MED ORDER — NITROFURANTOIN MONOHYD MACRO 100 MG PO CAPS: 100.0000 mg | ORAL_CAPSULE | Freq: Two times a day (BID) | ORAL | 0 refills | Status: DC

## 2019-07-14 NOTE — Telephone Encounter (Addendum)
Patient inquiring about urine results, and would like a follow call today

## 2019-07-14 NOTE — Telephone Encounter (Signed)
Please let him know that his UA grew out some bacteria. I've sent an antibiotic to his pharmacy. Thanks!

## 2019-07-14 NOTE — Telephone Encounter (Signed)
Called and spoke with patient, notified him that he does have a UTI and that he has an antibiotic at the pharmacy, and answered all his questions.

## 2019-07-14 NOTE — Telephone Encounter (Signed)
Incoming call from Patient asking what did the urine culture result state .  Provided results of Dr. Wynetta Emery of 07/14/19 Aware of Rx called in.  Questions price of medication and why it different than Augmentin. Recommended Patient contact his pharmacist. Stated he would.

## 2019-07-14 NOTE — Telephone Encounter (Signed)
Pt would like to speak with a nurse regarding his urine culture.  Pt states that he wants to know when he was originally given medication for urinary frequency.  Pt very frustrated and wants to know what is going on.

## 2019-07-14 NOTE — Telephone Encounter (Signed)
The culture showed that his UTI was sensitive to the macrobid, so we used it to try to avoid causing resistance to the augmentin. It should be around the same cost as the augmentin.

## 2019-07-14 NOTE — Telephone Encounter (Signed)
Copied from [place] [phone]. Topic: General - Other >> Jul 14, 2019  7:44 AM Lennox Solders wrote: Reason for CRM: pt is having uti symptoms again frequency urination. Pt would like to drop off a urine sample off this morning

## 2019-07-14 NOTE — Telephone Encounter (Signed)
Was seen last week. 

## 2019-07-17 ENCOUNTER — Telehealth: Payer: Self-pay | Admitting: Family Medicine

## 2019-07-17 NOTE — Telephone Encounter (Signed)
Please call these in  

## 2019-07-17 NOTE — Telephone Encounter (Signed)
Pt called [facility] asking for the BD Brand Name Nano 41mm one box of 100.  He specifically ask for these needles.  He didn't want the one he usually gets.

## 2019-07-17 NOTE — Telephone Encounter (Signed)
Message already sent to clinical

## 2019-07-17 NOTE — Telephone Encounter (Signed)
Needles called in

## 2019-07-17 NOTE — Telephone Encounter (Signed)
Patient would like to speak with Tiffany regarding most recent lab results that was sent to him in a letter, please advise

## 2019-07-21 ENCOUNTER — Other Ambulatory Visit: Payer: Self-pay

## 2019-07-21 NOTE — Patient Outreach (Signed)
[facility]) Care Management  07/21/2019  Rick [name] [birth year] [id number]   Medication Adherence call to Rick [name] [facility] Identifiers Verify spoke with patient he is past due on enalepril 10 mg,patient explain he takes 1 tablet daily,patient has medication at this time but ask to call his pharmacy to fill and have them deliver.Rick [name], Rick [name] is showing past due under [place].   [facility] Direct Dial [phone]  Fax [phone] [email or web address]

## 2019-07-28 ENCOUNTER — Other Ambulatory Visit: Payer: Self-pay

## 2019-07-28 ENCOUNTER — Encounter: Payer: Self-pay | Admitting: Family Medicine

## 2019-07-28 ENCOUNTER — Ambulatory Visit (INDEPENDENT_AMBULATORY_CARE_PROVIDER_SITE_OTHER): Payer: Medicare Other | Admitting: Family Medicine

## 2019-07-28 VITALS — BP 120/76 | HR 76 | Temp 98.7°F

## 2019-07-28 DIAGNOSIS — N390 Urinary tract infection, site not specified: Secondary | ICD-10-CM

## 2019-07-28 DIAGNOSIS — R300 Dysuria: Secondary | ICD-10-CM | POA: Diagnosis not present

## 2019-07-28 MED ORDER — AMOXICILLIN-POT CLAVULANATE 875-125 MG PO TABS: 1.0000 | ORAL_TABLET | Freq: Two times a day (BID) | ORAL | 0 refills | Status: DC

## 2019-07-28 NOTE — Assessment & Plan Note (Signed)
Continues to have recurrent UTIs. Will not follow up with urology. Again stressed the importance of seeing urology. Will treat with augmentin x7 days. Follow up as needed.

## 2019-07-28 NOTE — Progress Notes (Signed)
BP 120/76   Pulse 76   Temp 98.7 F (37.1 C)   SpO2 98%    Subjective:    Patient ID: Rick [name], male    DOB: [birth year], 78 y.o.   MRN: [id number]  HPI: Rick [name] is a 78 y.o. male  Chief Complaint  Patient presents with  . Urinary Tract Infection   URINARY SYMPTOMS Duration: Did not get better with last Rx Dysuria: burning Urinary frequency: yes Urgency: yes Small volume voids: no Symptom severity: no Urinary incontinence: yes Foul odor: yes Hematuria: no Abdominal pain: no Back pain: no Suprapubic pain/pressure: yes Flank pain: no Fever:  no Vomiting: no Relief with cranberry juice: no Relief with pyridium: no Status: better/worse/stable Previous urinary tract infection: yes Recurrent urinary tract infection: yes History of sexually transmitted disease: no Penile discharge: no Treatments attempted: pyridium, cranberry and increasing fluids   Relevant past medical, surgical, family and social history reviewed and updated as indicated. Interim medical history since our last visit reviewed. Allergies and medications reviewed and updated.  Review of Systems  Constitutional: Negative.   Respiratory: Negative.   Cardiovascular: Negative.   Genitourinary: Positive for dysuria, frequency, hematuria and urgency. Negative for decreased urine volume, difficulty urinating, discharge, enuresis, flank pain, genital sores, penile pain, penile swelling, scrotal swelling and testicular pain.  Skin: Negative.   Psychiatric/Behavioral: Negative.     Per HPI unless specifically indicated above     Objective:    BP 120/76   Pulse 76   Temp 98.7 F (37.1 C)   SpO2 98%   Wt Readings from Last 3 Encounters:  01/03/19 169 lb 9 oz (76.9 kg)  12/31/18 169 lb 9 oz (76.9 kg)  10/24/18 165 lb 1 oz (74.9 kg)    Physical Exam Vitals and nursing note reviewed.  Constitutional:      General: He is not in acute distress.    Appearance: Normal appearance. He is  not ill-appearing, toxic-appearing or diaphoretic.  HENT:     Head: Normocephalic and atraumatic.     Right Ear: External ear normal.     Left Ear: External ear normal.     Nose: Nose normal.     Mouth/Throat:     Mouth: Mucous membranes are moist.     Pharynx: Oropharynx is clear.  Eyes:     General: No scleral icterus.       Right eye: No discharge.        Left eye: No discharge.     Extraocular Movements: Extraocular movements intact.     Conjunctiva/sclera: Conjunctivae normal.     Pupils: Pupils are equal, round, and reactive to light.  Cardiovascular:     Rate and Rhythm: Normal rate and regular rhythm.     Pulses: Normal pulses.     Heart sounds: Normal heart sounds. No murmur. No friction rub. No gallop.   Pulmonary:     Effort: Pulmonary effort is normal. No respiratory distress.     Breath sounds: Normal breath sounds. No stridor. No wheezing, rhonchi or rales.  Chest:     Chest wall: No tenderness.  Musculoskeletal:        General: Normal range of motion.     Cervical back: Normal range of motion and neck supple.  Skin:    General: Skin is warm and dry.     Capillary Refill: Capillary refill takes less than 2 seconds.     Coloration: Skin is not jaundiced or pale.  Findings: No bruising, erythema, lesion or rash.  Neurological:     General: No focal deficit present.     Mental Status: He is alert and oriented to person, place, and time. Mental status is at baseline.  Psychiatric:        Mood and Affect: Mood normal.        Behavior: Behavior normal.        Thought Content: Thought content normal.        Judgment: Judgment normal.     Results for orders placed or performed in visit on 07/08/19  Microscopic Examination   BLD  Result Value Ref Range   WBC, UA 6-10 (A) 0 - 5 /hpf   RBC None seen 0 - 2 /hpf   Epithelial Cells (non renal) 0-10 0 - 10 /hpf   Casts Present None seen /lpf   Cast Type Hyaline casts N/A   Crystals Present N/A   Crystal Type  Calcium Oxalate N/A   Mucus, UA Present Not Estab.   Bacteria, UA Few (A) None seen/Few  Urine Culture, Reflex   BLD  Result Value Ref Range   Urine Culture, Routine Final report (A)    Organism ID, Bacteria Comment (A)    Antimicrobial Susceptibility Comment   Bayer DCA Hb A1c Waived  Result Value Ref Range   HB A1C (BAYER DCA - WAIVED) 6.2 <7.0 %  CBC with Differential OUT  Result Value Ref Range   WBC 7.9 3.4 - 10.8 x10E3/uL   RBC 5.11 4.14 - 5.80 x10E6/uL   Hemoglobin 14.9 13.0 - 17.7 g/dL   Hematocrit 43.0 37.5 - 51.0 %   MCV 84 79 - 97 fL   MCH 29.2 26.6 - 33.0 pg   MCHC 34.7 31.5 - 35.7 g/dL   RDW 13.7 11.6 - 15.4 %   Platelets 239 150 - 450 x10E3/uL   Neutrophils 56 Not Estab. %   Lymphs 31 Not Estab. %   Monocytes 8 Not Estab. %   Eos 3 Not Estab. %   Basos 1 Not Estab. %   Neutrophils Absolute 4.5 1.4 - 7.0 x10E3/uL   Lymphocytes Absolute 2.4 0.7 - 3.1 x10E3/uL   Monocytes Absolute 0.6 0.1 - 0.9 x10E3/uL   EOS (ABSOLUTE) 0.3 0.0 - 0.4 x10E3/uL   Basophils Absolute 0.1 0.0 - 0.2 x10E3/uL   Immature Granulocytes 1 Not Estab. %   Immature Grans (Abs) 0.1 0.0 - 0.1 x10E3/uL  Comp Met (CMET)  Result Value Ref Range   Glucose 88 65 - 99 mg/dL   BUN 21 8 - 27 mg/dL   Creatinine, Ser 1.00 0.76 - 1.27 mg/dL   GFR calc non Af Amer 72 >59 mL/min/1.73   GFR calc Af Amer 84 >59 mL/min/1.73   BUN/Creatinine Ratio 21 10 - 24   Sodium 139 134 - 144 mmol/L   Potassium 4.3 3.5 - 5.2 mmol/L   Chloride 102 96 - 106 mmol/L   CO2 21 20 - 29 mmol/L   Calcium 9.9 8.6 - 10.2 mg/dL   Total Protein 7.1 6.0 - 8.5 g/dL   Albumin 4.7 3.7 - 4.7 g/dL   Globulin, Total 2.4 1.5 - 4.5 g/dL   Albumin/Globulin Ratio 2.0 1.2 - 2.2   Bilirubin Total 0.7 0.0 - 1.2 mg/dL   Alkaline Phosphatase 70 39 - 117 IU/L   AST 18 0 - 40 IU/L   ALT 8 0 - 44 IU/L  Lipid Panel w/o Chol/HDL Ratio OUT  Result Value Ref Range  Cholesterol, Total 179 100 - 199 mg/dL   Triglycerides 143 0 - 149 mg/dL    HDL 43 >39 mg/dL   VLDL Cholesterol Cal 25 5 - 40 mg/dL   LDL Chol Calc (NIH) 111 (H) 0 - 99 mg/dL  Microalbumin, Urine Waived  Result Value Ref Range   Microalb, Ur Waived 10 0 - 19 mg/L   Creatinine, Urine Waived 50 10 - 300 mg/dL   Microalb/Creat Ratio 30-300 (H) <30 mg/g  PSA  Result Value Ref Range   Prostate Specific Ag, Serum 1.3 0.0 - 4.0 ng/mL  TSH  Result Value Ref Range   TSH 2.150 0.450 - 4.500 uIU/mL  UA/M w/rflx Culture, Routine   Specimen: Blood   BLD  Result Value Ref Range   Specific Gravity, UA 1.015 1.005 - 1.030   pH, UA 5.0 5.0 - 7.5   Color, UA Yellow Yellow   Appearance Ur Clear Clear   Leukocytes,UA 1+ (A) Negative   Protein,UA Negative Negative/Trace   Glucose, UA Negative Negative   Ketones, UA Negative Negative   RBC, UA Negative Negative   Bilirubin, UA Negative Negative   Urobilinogen, Ur 0.2 0.2 - 1.0 mg/dL   Nitrite, UA Negative Negative   Microscopic Examination See below:    Urinalysis Reflex Comment       Assessment & Plan:   Problem List Items Addressed This Visit      Genitourinary   Recurrent UTI - Primary    Continues to have recurrent UTIs. Will not follow up with urology. Again stressed the importance of seeing urology. Will treat with augmentin x7 days. Follow up as needed.        Other Visit Diagnoses    Dysuria       +2 Leuks and + Nitrates   Relevant Orders   UA/M w/rflx Culture, Routine-H       Follow up plan: Return As scheduled.

## 2019-07-30 LAB — UA/M W/RFLX CULTURE, ROUTINE
Bilirubin, UA: NEGATIVE
Glucose, UA: NEGATIVE
Ketones, UA: NEGATIVE
Nitrite, UA: POSITIVE — AB
Protein,UA: NEGATIVE
RBC, UA: NEGATIVE
Specific Gravity, UA: 1.015 (ref 1.005–1.030)
Urobilinogen, Ur: 0.2 mg/dL (ref 0.2–1.0)
pH, UA: 5 (ref 5.0–7.5)

## 2019-07-30 LAB — MICROSCOPIC EXAMINATION
RBC, Urine: NONE SEEN /hpf (ref 0–2)
WBC, UA: >30 /hpf — AB (ref 0–5)

## 2019-07-30 LAB — URINE CULTURE, REFLEX

## 2019-07-31 ENCOUNTER — Telehealth: Payer: Self-pay | Admitting: Family Medicine

## 2019-07-31 NOTE — Telephone Encounter (Signed)
Patient notified and verbalized understanding. 

## 2019-07-31 NOTE — Telephone Encounter (Signed)
Please let him know that his urine did not grow out any bacteria, so he can stop his augmentin.

## 2019-08-03 ENCOUNTER — Telehealth: Payer: Self-pay | Admitting: Family Medicine

## 2019-08-03 NOTE — Telephone Encounter (Signed)
Please let him know that his urine did not grow out any bacteria and he can stop his augmentin. Thanks.

## 2019-08-04 NOTE — Telephone Encounter (Signed)
Patient notified

## 2019-08-05 ENCOUNTER — Ambulatory Visit: Payer: Self-pay | Admitting: Pharmacist

## 2019-08-05 ENCOUNTER — Ambulatory Visit (INDEPENDENT_AMBULATORY_CARE_PROVIDER_SITE_OTHER): Payer: Medicare Other | Admitting: Pharmacist

## 2019-08-05 ENCOUNTER — Telehealth: Payer: Self-pay | Admitting: Pharmacist

## 2019-08-05 ENCOUNTER — Other Ambulatory Visit: Payer: Self-pay | Admitting: Family Medicine

## 2019-08-05 DIAGNOSIS — I10 Essential (primary) hypertension: Secondary | ICD-10-CM | POA: Diagnosis not present

## 2019-08-05 DIAGNOSIS — E1142 Type 2 diabetes mellitus with diabetic polyneuropathy: Secondary | ICD-10-CM | POA: Diagnosis not present

## 2019-08-05 MED ORDER — BASAGLAR KWIKPEN 100 UNIT/ML ~~LOC~~ SOPN: 15.0000 [IU] | PEN_INJECTOR | Freq: Every day | SUBCUTANEOUS | 3 refills | Status: DC

## 2019-08-05 MED ORDER — INSULIN PEN NEEDLE 29G X 12MM MISC: 12 refills | Status: DC

## 2019-08-05 NOTE — Chronic Care Management (AMB) (Signed)
Chronic Care Management   Follow Up Note   08/05/2019 Name: Rick [name] MRN: [id number] DOB: 1941-06-26  Referred by: [name], DO Reason for referral : Chronic Care Management (Medication Management)   Rick [name] is a 78 y.o. year old male who is a primary care patient of [name], DO. The CCM team was consulted for assistance with chronic disease management and care coordination needs.    Received message that patient had questions about medication access.  Review of patient status, including review of consultants reports, relevant laboratory and other test results, and collaboration with appropriate care team members and the patient's provider was performed as part of comprehensive patient evaluation and provision of chronic care management services.    SDOH (Social Determinants of Health) screening performed today: Financial Strain . See Care Plan for related entries.   Outpatient Encounter Medications as of 08/05/2019  Medication Sig  . amoxicillin-clavulanate (AUGMENTIN) 875-125 MG tablet Take 1 tablet by mouth 2 (two) times daily.  . Blood Glucose Monitoring Suppl (ONE TOUCH ULTRA SYSTEM KIT) w/Device KIT 1 kit by Does not apply route once.  . carbidopa-levodopa (SINEMET IR) 25-100 MG tablet Take 1 tablet by mouth 4 (four) times daily.   . carvedilol (COREG) 6.25 MG tablet Take 1 tablet (6.25 mg total) by mouth 2 (two) times daily with a meal.  . diclofenac sodium (VOLTAREN) 1 % GEL Apply 2 g topically 4 (four) times daily. (Patient taking differently: Apply 2 g topically 4 (four) times daily as needed (lower back pain.). )  . enalapril (VASOTEC) 10 MG tablet Take 1 tablet (10 mg total) by mouth daily.  . fluticasone (FLONASE) 50 MCG/ACT nasal spray Place 2 sprays into both nostrils 2 (two) times daily.  [name] gabapentin (NEURONTIN) 300 MG capsule Take 2 capsules (600 mg total) by mouth 2 (two) times daily.  . hydrochlorothiazide (HYDRODIURIL) 25 MG tablet  TAKE ONE (1) TABLET EACH DAY  . Insulin Glargine (BASAGLAR KWIKPEN) 100 UNIT/ML SOPN Inject 0.15 mLs (15 Units total) into the skin daily.  . Insulin Pen Needle (ULTICARE PEN NEEDLES) 29G X 12MM MISC USE AS DIRECTED  . Lancets (ONETOUCH ULTRASOFT) lancets TEST BLOOD SUGAR THREE TIMES DAILY  . lovastatin (MEVACOR) 40 MG tablet Take 1 tablet (40 mg total) by mouth daily.  . Menthol, Topical Analgesic, (BENGAY EX) Apply 1 application topically 3 (three) times daily as needed (lower back pain.).  [name] metFORMIN (GLUCOPHAGE) 1000 MG tablet TAKE 1 TABLET BY MOUTH TWICE A DAY WITH A MEAL  . mirtazapine (REMERON) 15 MG tablet Take 1 tablet (15 mg total) by mouth at bedtime.  Glory Rosebush ULTRA test strip TEST BLOOD SUGAR THREE TIMES DAILY.  . silodosin (RAPAFLO) 8 MG CAPS capsule Take 1 capsule (8 mg total) by mouth daily with breakfast.  . traZODone (DESYREL) 100 MG tablet Take 1 tablet (100 mg total) by mouth at bedtime.  . valACYclovir (VALTREX) 1000 MG tablet Take 1 tablet (1,000 mg total) by mouth daily as needed.  . [DISCONTINUED] Insulin Glargine (LANTUS SOLOSTAR) 100 UNIT/ML Solostar Pen Inject 15 Units into the skin daily at 10 pm.  . [DISCONTINUED] Insulin Pen Needle (ULTICARE PEN NEEDLES) 29G X 12MM MISC USE AS DIRECTED   No facility-administered encounter medications on file as of 08/05/2019.     Goals Addressed            This Visit's Progress     Patient Stated   . PharmD "I want to take  care of my diabetes" (pt-stated)       Current Barriers:  . Diabetes: controlled; most recent today A1c 6.2% o Reports cost concerns w/ insulin therapy. Called today noting that he does not have enough of a supply of Lantus to get through the end of the year, and cannot afford the $60 to pick it up o At least visit, discussed switching from Lantus to [employer]. Patient may also qualify for Medicare Extra Help. . Current antihyperglycemic regimen: metformin 1000 mg BID, Lantus 15 units daily . Denies  episodes of hypoglycemia  . Cardiovascular risk reduction: o Current hypertensive regimen: prescribed HCTZ, enalapril, and carvedilol, though per refill hx report, does not appear that patient has filled HCTZ or enalapril lately o Current hyperlipidemia regimen: lovastatin 40 mg dialy;   Pharmacist Clinical Goal(s):  [name] Over the next 90 days, patient will work with PharmD and primary care provider to address optimized medication management   Interventions: . Reviewed that he would qualify for Basaglar assistance, and that he would be eligible for a once-per-lifetime 30 day supply of Basaglar, and that he would not qualify for Lantus.  . Discussed Medicare Extra Help. Patient appears that he would qualify. However, he is confused and would rather discuss this next month.   . Completed a three way call to [facility]. Patient qualified for a free 30 day supply. They emailed me a voucher card for a free 30 day supply of medication (BIN [id number] PCN [id number] Group FVLDSCV ID [id number]). Called this information over to [employer].   . Will collaborate w/ CPhT to mail patient his portion of application for [name] for [employer]. Will collaborate w/ Dr. Wynetta Emery for her signature on prescription. Patient will bring copies of income information by clinic. Once all parts received, will collaborate w/ CPhT to submit  Patient Self Care Activities:  . Patient will check blood glucose daily , document, and provide at future appointments . Patient will take medications as prescribed . Patient will report any questions or concerns to provider   Please see past updates related to this goal by clicking on the "Past Updates" button in the selected goal          Plan: - Will collaborate w/ CPhT and provider on medication access as above - Will outreach patient as scheduled in January  Courtney Heys, PharmD, [place] [phone]

## 2019-08-05 NOTE — Progress Notes (Signed)
Error

## 2019-08-05 NOTE — Telephone Encounter (Signed)
See CCM note    Copied from [place] [id number]. Topic: General - Inquiry >> Aug 05, 2019  1:37 PM Lennox Solders wrote: Reason for CRM: pt would like catie to call him back concerning lantus insulin

## 2019-08-05 NOTE — Patient Instructions (Signed)
Visit Information  Goals Addressed            This Visit's Progress     Patient Stated   . PharmD "I want to take care of my diabetes" (pt-stated)       Current Barriers:  . Diabetes: controlled; most recent today A1c 6.2% o Reports cost concerns w/ insulin therapy. Called today noting that he does not have enough of a supply of Lantus to get through the end of the year, and cannot afford the $60 to pick it up o At least visit, discussed switching from Lantus to [employer]. Patient may also qualify for Medicare Extra Help. . Current antihyperglycemic regimen: metformin 1000 mg BID, Lantus 15 units daily . Denies episodes of hypoglycemia  . Cardiovascular risk reduction: o Current hypertensive regimen: prescribed HCTZ, enalapril, and carvedilol, though per refill hx report, does not appear that patient has filled HCTZ or enalapril lately o Current hyperlipidemia regimen: lovastatin 40 mg dialy;   Pharmacist Clinical Goal(s):  [name] Over the next 90 days, patient will work with PharmD and primary care provider to address optimized medication management   Interventions: . Reviewed that he would qualify for Basaglar assistance, and that he would be eligible for a once-per-lifetime 30 day supply of Basaglar, and that he would not qualify for Lantus.  . Discussed Medicare Extra Help. Patient appears that he would qualify. However, he is confused and would rather discuss this next month.   . Completed a three way call to [place]. Patient qualified for a free 30 day supply. They emailed me a voucher card for a free 30 day supply of medication (BIN [id number] PCN [id number] Group FVLDSCV ID [id number]). Called this information over to [employer].   . Will collaborate w/ CPhT to mail patient his portion of application for [name] for [employer]. Will collaborate w/ Dr. Wynetta Emery for her signature on prescription. Patient will bring copies of income information by clinic. Once all  parts received, will collaborate w/ CPhT to submit  Patient Self Care Activities:  . Patient will check blood glucose daily , document, and provide at future appointments . Patient will take medications as prescribed . Patient will report any questions or concerns to provider   Please see past updates related to this goal by clicking on the "Past Updates" button in the selected goal         The patient verbalized understanding of instructions provided today and declined a print copy of patient instruction materials.  Plan: - Will collaborate w/ CPhT and provider on medication access as above - Will outreach patient as scheduled in January  Courtney Heys, PharmD, [place] [phone]

## 2019-08-06 ENCOUNTER — Ambulatory Visit: Payer: Self-pay | Admitting: Pharmacist

## 2019-08-06 ENCOUNTER — Other Ambulatory Visit: Payer: Self-pay | Admitting: Family Medicine

## 2019-08-06 DIAGNOSIS — E1142 Type 2 diabetes mellitus with diabetic polyneuropathy: Secondary | ICD-10-CM

## 2019-08-06 NOTE — Telephone Encounter (Signed)
See CCM note 

## 2019-08-06 NOTE — Chronic Care Management (AMB) (Signed)
Chronic Care Management   Follow Up Note   08/06/2019 Name: Rick [name] MRN: [id number] DOB: 1941-04-24  Referred by: [name], DO Reason for referral : Chronic Care Management (Medication Management)   Rick [name] is a 78 y.o. year old male who is a primary care patient of [name], DO. The CCM team was consulted for assistance with chronic disease management and care coordination needs.    Care coordination completed today.  Review of patient status, including review of consultants reports, relevant laboratory and other test results, and collaboration with appropriate care team members and the patient's provider was performed as part of comprehensive patient evaluation and provision of chronic care management services.    SDOH (Social Determinants of Health) screening performed today: Financial Strain . See Care Plan for related entries.   Outpatient Encounter Medications as of 08/06/2019  Medication Sig  . amoxicillin-clavulanate (AUGMENTIN) 875-125 MG tablet Take 1 tablet by mouth 2 (two) times daily.  . Blood Glucose Monitoring Suppl (ONE TOUCH ULTRA SYSTEM KIT) w/Device KIT 1 kit by Does not apply route once.  . carbidopa-levodopa (SINEMET IR) 25-100 MG tablet Take 1 tablet by mouth 4 (four) times daily.   . carvedilol (COREG) 6.25 MG tablet Take 1 tablet (6.25 mg total) by mouth 2 (two) times daily with a meal.  . diclofenac sodium (VOLTAREN) 1 % GEL Apply 2 g topically 4 (four) times daily. (Patient taking differently: Apply 2 g topically 4 (four) times daily as needed (lower back pain.). )  . enalapril (VASOTEC) 10 MG tablet Take 1 tablet (10 mg total) by mouth daily.  . fluticasone (FLONASE) 50 MCG/ACT nasal spray Place 2 sprays into both nostrils 2 (two) times daily.  [name] gabapentin (NEURONTIN) 300 MG capsule Take 2 capsules (600 mg total) by mouth 2 (two) times daily.  . hydrochlorothiazide (HYDRODIURIL) 25 MG tablet TAKE ONE (1) TABLET EACH DAY  .  Insulin Glargine (BASAGLAR KWIKPEN) 100 UNIT/ML SOPN Inject 0.15 mLs (15 Units total) into the skin daily.  . Insulin Pen Needle (ULTICARE PEN NEEDLES) 29G X 12MM MISC USE AS DIRECTED  . Lancets (ONETOUCH ULTRASOFT) lancets TEST BLOOD SUGAR THREE TIMES DAILY  . lovastatin (MEVACOR) 40 MG tablet Take 1 tablet (40 mg total) by mouth daily.  . Menthol, Topical Analgesic, (BENGAY EX) Apply 1 application topically 3 (three) times daily as needed (lower back pain.).  [name] metFORMIN (GLUCOPHAGE) 1000 MG tablet TAKE 1 TABLET BY MOUTH TWICE A DAY WITH A MEAL  . mirtazapine (REMERON) 15 MG tablet Take 1 tablet (15 mg total) by mouth at bedtime.  Glory Rosebush ULTRA test strip TEST BLOOD SUGAR THREE TIMES DAILY.  . silodosin (RAPAFLO) 8 MG CAPS capsule Take 1 capsule (8 mg total) by mouth daily with breakfast.  . traZODone (DESYREL) 100 MG tablet Take 1 tablet (100 mg total) by mouth at bedtime.  . valACYclovir (VALTREX) 1000 MG tablet Take 1 tablet (1,000 mg total) by mouth daily as needed.   No facility-administered encounter medications on file as of 08/06/2019.     Goals Addressed            This Visit's Progress     Patient Stated   . PharmD "I want to take care of my diabetes" (pt-stated)       Current Barriers:  . Diabetes: controlled; most recent today A1c 6.2% o Received call from pharmacist at [facility] today, requesting a copy of the copay card to keep on file.  [name]  Current antihyperglycemic regimen: metformin 1000 mg BID, Lantus 15 units daily . Denies episodes of hypoglycemia  . Cardiovascular risk reduction: o Current hypertensive regimen: prescribed HCTZ, enalapril, and carvedilol, though per refill hx report, does not appear that patient has filled HCTZ or enalapril lately o Current hyperlipidemia regimen: lovastatin 40 mg dialy;   Pharmacist Clinical Goal(s):  [name] Over the next 90 days, patient will work with PharmD and primary care provider to address optimized medication management     Interventions: . Explained that the free voucher card can be used once per lifetime with Medicare patients. Faxed card, plus email from [place], to [place] at [employer]  Patient Self Care Activities:  . Patient will check blood glucose daily , document, and provide at future appointments . Patient will take medications as prescribed . Patient will report any questions or concerns to provider   Please see past updates related to this goal by clicking on the "Past Updates" button in the selected goal          Plan: - Will follow up with patient as previously scheduled  Catie Darnelle Maffucci, PharmD, [place] [phone]

## 2019-08-06 NOTE — Telephone Encounter (Signed)
[facility] needs to speak with Rick [name] concerning the coupon you gave to pt yesterday.

## 2019-08-06 NOTE — Patient Instructions (Signed)
Visit Information  Goals Addressed            This Visit's Progress     Patient Stated   . PharmD "I want to take care of my diabetes" (pt-stated)       Current Barriers:  . Diabetes: controlled; most recent today A1c 6.2% o Received call from pharmacist at [facility] today, requesting a copy of the copay card to keep on file.  . Current antihyperglycemic regimen: metformin 1000 mg BID, Lantus 15 units daily . Denies episodes of hypoglycemia  . Cardiovascular risk reduction: o Current hypertensive regimen: prescribed HCTZ, enalapril, and carvedilol, though per refill hx report, does not appear that patient has filled HCTZ or enalapril lately o Current hyperlipidemia regimen: lovastatin 40 mg dialy;   Pharmacist Clinical Goal(s):  [name] Over the next 90 days, patient will work with PharmD and primary care provider to address optimized medication management   Interventions: . Explained that the free voucher card can be used once per lifetime with Medicare patients. Faxed card, plus email from [place], to [place] at [employer]  Patient Self Care Activities:  . Patient will check blood glucose daily , document, and provide at future appointments . Patient will take medications as prescribed . Patient will report any questions or concerns to provider   Please see past updates related to this goal by clicking on the "Past Updates" button in the selected goal         The patient verbalized understanding of instructions provided today and declined a print copy of patient instruction materials.    Plan: - Will follow up with patient as previously scheduled  Catie Darnelle Maffucci, PharmD, [place] [phone]

## 2019-08-06 NOTE — Telephone Encounter (Signed)
Copied from [place] [id number]. Topic: General - Other >> Aug 06, 2019  1:02 PM Rainey Pines A wrote: Pharmacy called and stated that prescription for BD Nano Ultra Sign Pen Needles 22mmx32 NDC# [id number] needed to be sent over to pharmacy because the wrong ones that the patient doesn't like were called in.  [place], [place]  Phone:  [phone] Fax:  [phone]

## 2019-08-11 NOTE — Telephone Encounter (Signed)
Unsure of what you need me to do with this.

## 2019-08-13 ENCOUNTER — Other Ambulatory Visit: Payer: Self-pay | Admitting: Pharmacy Technician

## 2019-08-13 NOTE — Patient Outreach (Signed)
[facility]) Care Management  08/13/2019  ANTOWAN VINYARD 1941-05-26 [id number]                                        Medication Assistance Referral  Referral From: [place]: Nancee Liter / [name] Patient application portion:  Mailed Provider application portion:  N/A Embedded pharmacist to have signed while in clinic to Dr. Park Liter Provider address/fax verified via: Call to office     Follow up:  Will follow up with patient in 5-10 business days to confirm application(s) have been received.  Tamu Golz P. Shourya Macpherson, [place] Management [phone]

## 2019-08-21 ENCOUNTER — Other Ambulatory Visit: Payer: Self-pay | Admitting: Pharmacy Technician

## 2019-08-21 NOTE — Patient Outreach (Signed)
[facility]) Care Management  08/21/2019  Rick [name] [birth year] [id number]    Incoming call received from patient in regards to [place] application for [employer].  Spoke to patient, HIPAA identifiers verified.  Patient confirmed getting the application. Went over the entire application process with the patient. The patient prefers to wait on his 2021 awards statement to arrive from social security instead of using bank statements as his proof of income. Patient informed he has enough medication for 3 months or until March. Patient informed he would either call or send back the application around the 2nd week of February.  Will followup with patient in 30-45 business days as requested by patient.  Dagon Budai P. Corrine Tillis, [place] [facility] [phone]

## 2019-08-26 ENCOUNTER — Ambulatory Visit (INDEPENDENT_AMBULATORY_CARE_PROVIDER_SITE_OTHER): Payer: Medicare Other | Admitting: Pharmacist

## 2019-08-26 DIAGNOSIS — F32A Depression, unspecified: Secondary | ICD-10-CM

## 2019-08-26 DIAGNOSIS — F329 Major depressive disorder, single episode, unspecified: Secondary | ICD-10-CM | POA: Diagnosis not present

## 2019-08-26 DIAGNOSIS — E1142 Type 2 diabetes mellitus with diabetic polyneuropathy: Secondary | ICD-10-CM | POA: Diagnosis not present

## 2019-08-26 DIAGNOSIS — F419 Anxiety disorder, unspecified: Secondary | ICD-10-CM

## 2019-08-26 DIAGNOSIS — F411 Generalized anxiety disorder: Secondary | ICD-10-CM

## 2019-08-26 NOTE — Patient Instructions (Signed)
Visit Information  Goals Addressed            This Visit's Progress     Patient Stated   . PharmD "I want to take care of my diabetes" (pt-stated)       Current Barriers:  . Diabetes: controlled; most recent A1c 6.2% o Currently working on [employer] patient assistance. Patient plans on waiting to receive 2021 SSA income information in the mail prior to submitting application o Today, patient is extremely upset at his son, and was not able to fully discuss medications and blood sugars . Current antihyperglycemic regimen: metformin 1000 mg BID, Lantus 15 units daily o Previously on glipizide; d/t A1c <6% in August, Dr. Wynetta Emery d/c glipizide. Patient notes that he is still taking glipizide 2.5 mg daily, as he was concerned about his A1c having increased to 6.2%. Adamant that he does not want glipizide discontinued.  . Denies episodes of hypoglycemia . Current blood glucose readings: noted they remain 110s-140s, though no specific numbers noted on his recall . Dermatology: patient notes that he was prescribed "a retinoid" for his acne/black heads, but that it was too expensive. Unsure the name. . Cardiovascular risk reduction: o Current hypertensive regimen: prescribed HCTZ, enalapril, and carvedilol; however, last fill date per outside fill history for enalapril was 03/19/2019 for a 90 day supply o Current hyperlipidemia regimen: lovastatin 40 mg daily; last fill date for lovastatin was 04/26/2019 for a 90 day supply. Did not get a chance to fully review medications with patient today  Pharmacist Clinical Goal(s):  [name] Over the next 90 days, patient will work with PharmD and primary care provider to address optimized medication management   Interventions: . Discussed need for proof of income prior to submiting documentation. Noted that we could use 2020 income information if he had not received 2021 yet. Patient will consider and continue to collaborate w/ CPhT and I on medication  access . Discussed risk of hypoglycemia. Reviewed goal A1c. Noted that he is at goal, and does not need glipizide.  [facility]; they noted that he was prescribed tretinoin 0.1% cream, and the copay was $68. Reviewed formulary; all topical acne products are Tier 4, except adapalene, which is Tier 3. Contacted [facility]), left message for RN that adapalene may be cheaper, but likely not significantly so, and to let me know if they are alternative options.  Patient Self Care Activities:  . Patient will check blood glucose daily , document, and provide at future appointments . Patient will take medications as prescribed . Patient will report any questions or concerns to provider   Please see past updates related to this goal by clicking on the "Past Updates" button in the selected goal         The patient verbalized understanding of instructions provided today and declined a print copy of patient instruction materials.   Plan: - Scheduled f/u call 2/2 at 10:30 am  Catie Darnelle Maffucci, PharmD, [place] [phone]

## 2019-08-26 NOTE — Chronic Care Management (AMB) (Signed)
Chronic Care Management   Follow Up Note   08/26/2019 Name: Rick [name] MRN: [id number] DOB: September 05, 1940  Referred by: Rick Roys, DO Reason for referral : Chronic Care Management (Medication Management)   Rick [name] is a 79 y.o. year old male who is a primary care patient of Rick Roys, DO. The CCM team was consulted for assistance with chronic disease management and care coordination needs.    Contacted patient for medication management review.   Review of patient status, including review of consultants reports, relevant laboratory and other test results, and collaboration with appropriate care team members and the patient's provider was performed as part of comprehensive patient evaluation and provision of chronic care management services.    SDOH (Social Determinants of Health) screening performed today: Financial Strain  Stress. See Care Plan for related entries.   Outpatient Encounter Medications as of 08/26/2019  Medication Sig  . Insulin Glargine (BASAGLAR KWIKPEN) 100 UNIT/ML SOPN Inject 0.15 mLs (15 Units total) into the skin daily.  . Insulin Pen Needle (ULTICARE PEN NEEDLES) 29G X 12MM MISC USE AS DIRECTED  . metFORMIN (GLUCOPHAGE) 1000 MG tablet TAKE 1 TABLET BY MOUTH TWICE A DAY WITH A MEAL  . amoxicillin-clavulanate (AUGMENTIN) 875-125 MG tablet Take 1 tablet by mouth 2 (two) times daily.  . Blood Glucose Monitoring Suppl (ONE TOUCH ULTRA SYSTEM KIT) w/Device KIT 1 kit by Does not apply route once.  . carbidopa-levodopa (SINEMET IR) 25-100 MG tablet Take 1 tablet by mouth 4 (four) times daily.   . carvedilol (COREG) 6.25 MG tablet Take 1 tablet (6.25 mg total) by mouth 2 (two) times daily with a meal.  . diclofenac sodium (VOLTAREN) 1 % GEL Apply 2 g topically 4 (four) times daily. (Patient taking differently: Apply 2 g topically 4 (four) times daily as needed (lower back pain.). )  . enalapril (VASOTEC) 10 MG tablet Take 1 tablet (10 mg total) by mouth  daily.  . fluticasone (FLONASE) 50 MCG/ACT nasal spray Place 2 sprays into both nostrils 2 (two) times daily.  [name] gabapentin (NEURONTIN) 300 MG capsule Take 2 capsules (600 mg total) by mouth 2 (two) times daily.  . hydrochlorothiazide (HYDRODIURIL) 25 MG tablet TAKE ONE (1) TABLET EACH DAY  . Lancets (ONETOUCH ULTRASOFT) lancets TEST BLOOD SUGAR THREE TIMES DAILY  . lovastatin (MEVACOR) 40 MG tablet Take 1 tablet (40 mg total) by mouth daily.  . Menthol, Topical Analgesic, (BENGAY EX) Apply 1 application topically 3 (three) times daily as needed (lower back pain.).  [name] mirtazapine (REMERON) 15 MG tablet Take 1 tablet (15 mg total) by mouth at bedtime.  Rick [name] ULTRA test strip TEST BLOOD SUGAR THREE TIMES DAILY.  . silodosin (RAPAFLO) 8 MG CAPS capsule Take 1 capsule (8 mg total) by mouth daily with breakfast.  . traZODone (DESYREL) 100 MG tablet Take 1 tablet (100 mg total) by mouth at bedtime.  . valACYclovir (VALTREX) 1000 MG tablet Take 1 tablet (1,000 mg total) by mouth daily as needed.   No facility-administered encounter medications on file as of 08/26/2019.     Goals Addressed            This Visit's Progress     Patient Stated   . PharmD "I want to take care of my diabetes" (pt-stated)       Current Barriers:  . Diabetes: controlled; most recent A1c 6.2% o Currently working on [employer] patient assistance. Patient plans on waiting to receive 2021 SSA income information  in the mail prior to submitting application o Today, patient is extremely upset at his son, and was not able to fully discuss medications and blood sugars . Current antihyperglycemic regimen: metformin 1000 mg BID, Lantus 15 units daily o Previously on glipizide; d/t A1c <6% in August, Dr. Wynetta [name] d/c glipizide. Patient notes that he is still taking glipizide 2.5 mg daily, as he was concerned about his A1c having increased to 6.2%. Adamant that he does not want glipizide discontinued.  . Denies episodes of  hypoglycemia . Current blood glucose readings: noted they remain 110s-140s, though no specific numbers noted on his recall . Dermatology: patient notes that he was prescribed "a retinoid" for his acne/black heads, but that it was too expensive. Unsure the name. . Cardiovascular risk reduction: o Current hypertensive regimen: prescribed HCTZ, enalapril, and carvedilol; however, last fill date per outside fill history for enalapril was 03/19/2019 for a 90 day supply o Current hyperlipidemia regimen: lovastatin 40 mg daily; last fill date for lovastatin was 04/26/2019 for a 90 day supply. Did not get a chance to fully review medications with patient today  Pharmacist Clinical Goal(s):  [name] Over the next 90 days, patient will work with PharmD and primary care provider to address optimized medication management   Interventions: . Discussed need for proof of income prior to submiting documentation. Noted that we could use 2020 income information if he had not received 2021 yet. Patient will consider and continue to collaborate w/ CPhT and I on medication access . Discussed risk of hypoglycemia. Reviewed goal A1c. Noted that he is at goal, and does not need glipizide.  [name] pharmacy; they noted that he was prescribed tretinoin 0.1% cream, and the copay was $68. Reviewed formulary; all topical acne products are Tier 4, except adapalene, which is Tier 3. Contacted Rick [name]), left message for RN that adapalene may be cheaper, but likely not significantly so, and to let me know if they are alternative options.  Patient Self Care Activities:  . Patient will check blood glucose daily , document, and provide at future appointments . Patient will take medications as prescribed . Patient will report any questions or concerns to provider   Please see past updates related to this goal by clicking on the "Past Updates" button in the selected goal          Plan: - Scheduled f/u call 2/2  at 10:30 am  Rick [name], PharmD, Rick [name] [phone]

## 2019-08-28 ENCOUNTER — Ambulatory Visit: Payer: Self-pay | Admitting: Pharmacist

## 2019-08-28 DIAGNOSIS — E1142 Type 2 diabetes mellitus with diabetic polyneuropathy: Secondary | ICD-10-CM | POA: Diagnosis not present

## 2019-08-28 DIAGNOSIS — F329 Major depressive disorder, single episode, unspecified: Secondary | ICD-10-CM | POA: Diagnosis not present

## 2019-08-28 DIAGNOSIS — F419 Anxiety disorder, unspecified: Secondary | ICD-10-CM | POA: Diagnosis not present

## 2019-08-28 NOTE — Patient Instructions (Signed)
Visit Information  Goals Addressed            This Visit's Progress     Patient Stated   . PharmD "I want to take care of my diabetes" (pt-stated)       Current Barriers:  . Diabetes: controlled; most recent A1c 6.2% o Currently working on [employer] patient assistance. Patient plans on waiting to receive 2021 SSA income information in the mail prior to submitting . Current antihyperglycemic regimen: metformin 1000 mg BID, Lantus 15 units daily o Previously on glipizide; d/t A1c <6% in August, Dr. Wynetta Emery d/c glipizide. Patient notes that he is still taking glipizide 2.5 mg daily, as he was concerned about his A1c having increased to 6.2%. Adamant that he does not want glipizide discontinued.  . Denies episodes of hypoglycemia . Dermatology: patient previously prescribed tretinoin for acne, but notes it was too expensive. . Cardiovascular risk reduction: o Current hypertensive regimen: prescribed HCTZ, enalapril, and carvedilol; however, last fill date per outside fill history for enalapril was 03/19/2019 for a 90 day supply o Current hyperlipidemia regimen: lovastatin 40 mg daily; last fill date for lovastatin was 04/26/2019 for a 90 day supply. Did not get a chance to fully review medications with patient today  Pharmacist Clinical Goal(s):  [name] Over the next 90 days, patient will work with PharmD and primary care provider to address optimized medication management   Interventions: . Received call back from [place], Therapist, sports at [facility]. They are sending in a prescription for adapalene to see if this is any cheaper than tretinoin. Will follow up about this next week. May pursue [employer]  Patient Self Care Activities:  . Patient will check blood glucose daily , document, and provide at future appointments . Patient will take medications as prescribed . Patient will report any questions or concerns to provider   Please see past updates related to this goal by clicking on the  "Past Updates" button in the selected goal         The patient verbalized understanding of instructions provided today and declined a print copy of patient instruction materials.   Plan: - Will follow up on access concerns w/ acne medication next week  Catie Darnelle Maffucci, PharmD, [place] [phone]

## 2019-08-28 NOTE — Chronic Care Management (AMB) (Signed)
Chronic Care Management   Follow Up Note   08/28/2019 Name: Rick [name] MRN: [id number] DOB: 07/23/1941  Referred by: [name], DO Reason for referral : Chronic Care Management (Medication Management)   Rick [name] is a 79 y.o. year old male who is a primary care patient of [name], DO. The CCM team was consulted for assistance with chronic disease management and care coordination needs.    Contacted patient for medication management review.   Review of patient status, including review of consultants reports, relevant laboratory and other test results, and collaboration with appropriate care team members and the patient's provider was performed as part of comprehensive patient evaluation and provision of chronic care management services.    SDOH (Social Determinants of Health) screening performed today: Financial Strain . See Care Plan for related entries.   Outpatient Encounter Medications as of 08/28/2019  Medication Sig  . amoxicillin-clavulanate (AUGMENTIN) 875-125 MG tablet Take 1 tablet by mouth 2 (two) times daily.  . Blood Glucose Monitoring Suppl (ONE TOUCH ULTRA SYSTEM KIT) w/Device KIT 1 kit by Does not apply route once.  . carbidopa-levodopa (SINEMET IR) 25-100 MG tablet Take 1 tablet by mouth 4 (four) times daily.   . carvedilol (COREG) 6.25 MG tablet Take 1 tablet (6.25 mg total) by mouth 2 (two) times daily with a meal.  . diclofenac sodium (VOLTAREN) 1 % GEL Apply 2 g topically 4 (four) times daily. (Patient taking differently: Apply 2 g topically 4 (four) times daily as needed (lower back pain.). )  . enalapril (VASOTEC) 10 MG tablet Take 1 tablet (10 mg total) by mouth daily.  . fluticasone (FLONASE) 50 MCG/ACT nasal spray Place 2 sprays into both nostrils 2 (two) times daily.  [name] gabapentin (NEURONTIN) 300 MG capsule Take 2 capsules (600 mg total) by mouth 2 (two) times daily.  . hydrochlorothiazide (HYDRODIURIL) 25 MG tablet TAKE ONE (1) TABLET  EACH DAY  . Insulin Glargine (BASAGLAR KWIKPEN) 100 UNIT/ML SOPN Inject 0.15 mLs (15 Units total) into the skin daily.  . Insulin Pen Needle (ULTICARE PEN NEEDLES) 29G X 12MM MISC USE AS DIRECTED  . Lancets (ONETOUCH ULTRASOFT) lancets TEST BLOOD SUGAR THREE TIMES DAILY  . lovastatin (MEVACOR) 40 MG tablet Take 1 tablet (40 mg total) by mouth daily.  . Menthol, Topical Analgesic, (BENGAY EX) Apply 1 application topically 3 (three) times daily as needed (lower back pain.).  [name] metFORMIN (GLUCOPHAGE) 1000 MG tablet TAKE 1 TABLET BY MOUTH TWICE A DAY WITH A MEAL  . mirtazapine (REMERON) 15 MG tablet Take 1 tablet (15 mg total) by mouth at bedtime.  Glory Rosebush ULTRA test strip TEST BLOOD SUGAR THREE TIMES DAILY.  . silodosin (RAPAFLO) 8 MG CAPS capsule Take 1 capsule (8 mg total) by mouth daily with breakfast.  . traZODone (DESYREL) 100 MG tablet Take 1 tablet (100 mg total) by mouth at bedtime.  . valACYclovir (VALTREX) 1000 MG tablet Take 1 tablet (1,000 mg total) by mouth daily as needed.   No facility-administered encounter medications on file as of 08/28/2019.     Goals Addressed            This Visit's Progress     Patient Stated   . PharmD "I want to take care of my diabetes" (pt-stated)       Current Barriers:  . Diabetes: controlled; most recent A1c 6.2% o Currently working on [employer] patient assistance. Patient plans on waiting to receive 2021 [employer] income information in  the mail prior to submitting . Current antihyperglycemic regimen: metformin 1000 mg BID, Lantus 15 units daily o Previously on glipizide; d/t A1c <6% in August, Dr. Wynetta Emery d/c glipizide. Patient notes that he is still taking glipizide 2.5 mg daily, as he was concerned about his A1c having increased to 6.2%. Adamant that he does not want glipizide discontinued.  . Denies episodes of hypoglycemia . Dermatology: patient previously prescribed tretinoin for acne, but notes it was too expensive. . Cardiovascular risk  reduction: o Current hypertensive regimen: prescribed HCTZ, enalapril, and carvedilol; however, last fill date per outside fill history for enalapril was 03/19/2019 for a 90 day supply o Current hyperlipidemia regimen: lovastatin 40 mg daily; last fill date for lovastatin was 04/26/2019 for a 90 day supply. Did not get a chance to fully review medications with patient today  Pharmacist Clinical Goal(s):  [name] Over the next 90 days, patient will work with PharmD and primary care provider to address optimized medication management   Interventions: . Received call back from [place], Therapist, sports at [facility]. They are sending in a prescription for adapalene to see if this is any cheaper than tretinoin. Will follow up about this next week. May pursue Tier Exception  Patient Self Care Activities:  . Patient will check blood glucose daily , document, and provide at future appointments . Patient will take medications as prescribed . Patient will report any questions or concerns to provider   Please see past updates related to this goal by clicking on the "Past Updates" button in the selected goal          Plan: - Will follow up on access concerns w/ acne medication next week  Catie Darnelle Maffucci, PharmD, [place] [phone]

## 2019-09-16 ENCOUNTER — Ambulatory Visit (INDEPENDENT_AMBULATORY_CARE_PROVIDER_SITE_OTHER): Payer: Medicare Other | Admitting: Pharmacist

## 2019-09-16 DIAGNOSIS — F411 Generalized anxiety disorder: Secondary | ICD-10-CM

## 2019-09-16 DIAGNOSIS — E1142 Type 2 diabetes mellitus with diabetic polyneuropathy: Secondary | ICD-10-CM

## 2019-09-16 NOTE — Chronic Care Management (AMB) (Signed)
Chronic Care Management   Follow Up Note   09/16/2019 Name: ZIAN DELAIR MRN: [id number] DOB: Feb 05, 1941  Referred by: [name], DO Reason for referral : Chronic Care Management (Medication Management)   BROGAN MARTIS is a 79 y.o. year old male who is a primary care patient of [name], DO. The CCM team was consulted for assistance with chronic disease management and care coordination needs.    Contacted patient for medication management review.   Review of patient status, including review of consultants reports, relevant laboratory and other test results, and collaboration with appropriate care team members and the patient's provider was performed as part of comprehensive patient evaluation and provision of chronic care management services.    SDOH (Social Determinants of Health) screening performed today: Financial Strain  Stress. See Care Plan for related entries.   Outpatient Encounter Medications as of 09/16/2019  Medication Sig  . amoxicillin-clavulanate (AUGMENTIN) 875-125 MG tablet Take 1 tablet by mouth 2 (two) times daily.  . Blood Glucose Monitoring Suppl (ONE TOUCH ULTRA SYSTEM KIT) w/Device KIT 1 kit by Does not apply route once.  . carbidopa-levodopa (SINEMET IR) 25-100 MG tablet Take 1 tablet by mouth 4 (four) times daily.   . carvedilol (COREG) 6.25 MG tablet Take 1 tablet (6.25 mg total) by mouth 2 (two) times daily with a meal.  . diclofenac sodium (VOLTAREN) 1 % GEL Apply 2 g topically 4 (four) times daily. (Patient taking differently: Apply 2 g topically 4 (four) times daily as needed (lower back pain.). )  . enalapril (VASOTEC) 10 MG tablet Take 1 tablet (10 mg total) by mouth daily.  . fluticasone (FLONASE) 50 MCG/ACT nasal spray Place 2 sprays into both nostrils 2 (two) times daily.  [name] gabapentin (NEURONTIN) 300 MG capsule Take 2 capsules (600 mg total) by mouth 2 (two) times daily.  . hydrochlorothiazide (HYDRODIURIL) 25 MG tablet TAKE ONE (1)  TABLET EACH DAY  . Insulin Glargine (BASAGLAR KWIKPEN) 100 UNIT/ML SOPN Inject 0.15 mLs (15 Units total) into the skin daily.  . Insulin Pen Needle (ULTICARE PEN NEEDLES) 29G X 12MM MISC USE AS DIRECTED  . Lancets (ONETOUCH ULTRASOFT) lancets TEST BLOOD SUGAR THREE TIMES DAILY  . lovastatin (MEVACOR) 40 MG tablet Take 1 tablet (40 mg total) by mouth daily.  . Menthol, Topical Analgesic, (BENGAY EX) Apply 1 application topically 3 (three) times daily as needed (lower back pain.).  [name] metFORMIN (GLUCOPHAGE) 1000 MG tablet TAKE 1 TABLET BY MOUTH TWICE A DAY WITH A MEAL  . mirtazapine (REMERON) 15 MG tablet Take 1 tablet (15 mg total) by mouth at bedtime.  Glory Rosebush ULTRA test strip TEST BLOOD SUGAR THREE TIMES DAILY.  . silodosin (RAPAFLO) 8 MG CAPS capsule Take 1 capsule (8 mg total) by mouth daily with breakfast.  . traZODone (DESYREL) 100 MG tablet Take 1 tablet (100 mg total) by mouth at bedtime.  . valACYclovir (VALTREX) 1000 MG tablet Take 1 tablet (1,000 mg total) by mouth daily as needed.   No facility-administered encounter medications on file as of 09/16/2019.     Objective:   Goals Addressed            This Visit's Progress     Patient Stated   . PharmD "I want to take care of my diabetes" (pt-stated)       Current Barriers:  . Diabetes: controlled; most recent A1c 6.2% o Working on patient assistance for [employer]  . Current antihyperglycemic regimen: metformin 1000  mg BID, Lantus 15 units daily o Previously on glipizide; d/t A1c <6% in August, Dr. Wynetta Emery d/c glipizide. Patient notes that he is still taking glipizide 2.5 mg daily, as he did not want to stop it. Continues to adamantly note that his goal A1c is 6.1% . Denies episodes of hypoglycemia. Notes he has only seen episodes of low blood sugars  . Dermatology: prescribed tretinoin 0.1% 45 g, but was concerned about $68 copay. Collaborated w/ dermatology to prescribe adapalene, but patient is adamant that he wants  tretinoin . Cardiovascular risk reduction: o Current hypertensive regimen: prescribed HCTZ, enalapril, and carvedilol; however, last fill date per outside fill history for enalapril was 03/19/2019 for a 90 day supply o Current hyperlipidemia regimen: lovastatin 40 mg daily;   Pharmacist Clinical Goal(s):  [name] Over the next 90 days, patient will work with PharmD and primary care provider to address optimized medication management   Interventions: . Discussed patient assistance application for Basaglar. Talked patient through completing application. He notes he will place this in mail today for Sharee Pimple, along with proof of income.  . Discussed glipizide. Patient still adamant that he worries about an A1c >6.1%. Discussed that given age, comorbidities, A1c <7% is an appropriate goal, and A1c <6.5% is a more stringent goal, but appropriate if no episodes of hypoglycemia. Though he denies hypoglycemia, discussed that the goal is preventing hypoglycemia in the future. We will see what his A1c is at upcoming appointment and he and Dr. Wynetta Emery will have a shared discussion about plans. I recommended that if A1c remains <7, glipizide is likely not needed.  [name] He will wait until he has $68 for the copay of his tretinoin, and will call the pharmacy to fill the medication at that time.  Patient Self Care Activities:  . Patient will check blood glucose daily , document, and provide at future appointments . Patient will take medications as prescribed . Patient will report any questions or concerns to provider   Please see past updates related to this goal by clicking on the "Past Updates" button in the selected goal          Plan:  - Scheduled f/u call 11/05/19  Catie Darnelle Maffucci, PharmD, [place] [phone]

## 2019-09-16 NOTE — Patient Instructions (Signed)
Visit Information  Goals Addressed            This Visit's Progress     Patient Stated   . PharmD "I want to take care of my diabetes" (pt-stated)       Current Barriers:  . Diabetes: controlled; most recent A1c 6.2% o Working on patient assistance for [employer]  . Current antihyperglycemic regimen: metformin 1000 mg BID, Lantus 15 units daily o Previously on glipizide; d/t A1c <6% in August, Dr. Wynetta Emery d/c glipizide. Patient notes that he is still taking glipizide 2.5 mg daily, as he did not want to stop it. Continues to adamantly note that his goal A1c is 6.1% . Denies episodes of hypoglycemia. Notes he has only seen episodes of low blood sugars  . Dermatology: prescribed tretinoin 0.1% 45 g, but was concerned about $68 copay. Collaborated w/ dermatology to prescribe adapalene, but patient is adamant that he wants tretinoin . Cardiovascular risk reduction: o Current hypertensive regimen: prescribed HCTZ, enalapril, and carvedilol; however, last fill date per outside fill history for enalapril was 03/19/2019 for a 90 day supply o Current hyperlipidemia regimen: lovastatin 40 mg daily;   Pharmacist Clinical Goal(s):  [name] Over the next 90 days, patient will work with PharmD and primary care provider to address optimized medication management   Interventions: . Discussed patient assistance application for [employer]. Talked patient through completing application. He notes he will place this in mail today for Sharee Pimple, along with proof of income.  . Discussed glipizide. Patient still adamant that he worries about an A1c >6.1%. Discussed that given age, comorbidities, A1c <7% is an appropriate goal, and A1c <6.5% is a more stringent goal, but appropriate if no episodes of hypoglycemia. Though he denies hypoglycemia, discussed that the goal is preventing hypoglycemia in the future. We will see what his A1c is at upcoming appointment and he and Dr. Wynetta Emery will have a shared discussion about plans. I  recommended that if A1c remains <7, glipizide is likely not needed.  [name] He will wait until he has $68 for the copay of his tretinoin, and will call the pharmacy to fill the medication at that time.  Patient Self Care Activities:  . Patient will check blood glucose daily , document, and provide at future appointments . Patient will take medications as prescribed . Patient will report any questions or concerns to provider   Please see past updates related to this goal by clicking on the "Past Updates" button in the selected goal         The patient verbalized understanding of instructions provided today and declined a print copy of patient instruction materials.   Plan:  - Scheduled f/u call 11/05/19  Catie Darnelle Maffucci, PharmD, [place] [phone]

## 2019-09-25 ENCOUNTER — Other Ambulatory Visit: Payer: Self-pay | Admitting: Pharmacy Technician

## 2019-09-25 NOTE — Patient Outreach (Signed)
[facility] [facility]) Care Management  09/25/2019  DICKSON BARFIELD 12/26/1940 [id number]    Received both patient and provider portion(s) of patient assistance application(s) for [place]. Faxed completed application and required documents into [name].  Will follow up with company(ies) in 7-14 business days to check status of application(s).  Jordanny Waddington P. Cortlandt Capuano, [place] Management [phone]

## 2019-09-29 ENCOUNTER — Other Ambulatory Visit: Payer: Self-pay

## 2019-09-29 ENCOUNTER — Emergency Department
Admission: EM | Admit: 2019-09-29 | Discharge: 2019-09-30 | Disposition: A | Discharge status: Home / Self Care | Payer: Medicare Other | Attending: Emergency Medicine | Admitting: Emergency Medicine

## 2019-09-29 ENCOUNTER — Other Ambulatory Visit: Payer: Self-pay | Admitting: Pharmacy Technician

## 2019-09-29 ENCOUNTER — Encounter: Payer: Self-pay | Admitting: *Deleted

## 2019-09-29 DIAGNOSIS — Z79899 Other long term (current) drug therapy: Secondary | ICD-10-CM | POA: Insufficient documentation

## 2019-09-29 DIAGNOSIS — D090 Carcinoma in situ of bladder: Secondary | ICD-10-CM | POA: Insufficient documentation

## 2019-09-29 DIAGNOSIS — R1011 Right upper quadrant pain: Secondary | ICD-10-CM | POA: Diagnosis not present

## 2019-09-29 DIAGNOSIS — Z87891 Personal history of nicotine dependence: Secondary | ICD-10-CM | POA: Insufficient documentation

## 2019-09-29 DIAGNOSIS — R1012 Left upper quadrant pain: Secondary | ICD-10-CM | POA: Diagnosis not present

## 2019-09-29 DIAGNOSIS — N390 Urinary tract infection, site not specified: Secondary | ICD-10-CM | POA: Insufficient documentation

## 2019-09-29 DIAGNOSIS — I10 Essential (primary) hypertension: Secondary | ICD-10-CM | POA: Diagnosis not present

## 2019-09-29 DIAGNOSIS — E119 Type 2 diabetes mellitus without complications: Secondary | ICD-10-CM | POA: Diagnosis not present

## 2019-09-29 DIAGNOSIS — Z794 Long term (current) use of insulin: Secondary | ICD-10-CM | POA: Diagnosis not present

## 2019-09-29 DIAGNOSIS — N401 Enlarged prostate with lower urinary tract symptoms: Secondary | ICD-10-CM | POA: Insufficient documentation

## 2019-09-29 LAB — COMPREHENSIVE METABOLIC PANEL
ALT: 6 U/L (ref 0–44)
AST: 21 U/L (ref 15–41)
Albumin: 4.7 g/dL (ref 3.5–5.0)
Alkaline Phosphatase: 57 U/L (ref 38–126)
Anion gap: 11 (ref 5–15)
BUN: 26 mg/dL — ABNORMAL HIGH (ref 8–23)
CO2: 24 mmol/L (ref 22–32)
Calcium: 9.6 mg/dL (ref 8.9–10.3)
Chloride: 104 mmol/L (ref 98–111)
Creatinine, Ser: 1.09 mg/dL (ref 0.61–1.24)
GFR calc Af Amer: >60 mL/min (ref >60)
GFR calc non Af Amer: >60 mL/min (ref >60)
Glucose, Bld: 139 mg/dL — ABNORMAL HIGH (ref 70–99)
Potassium: 3.7 mmol/L (ref 3.5–5.1)
Sodium: 139 mmol/L (ref 135–145)
Total Bilirubin: 1.4 mg/dL — ABNORMAL HIGH (ref 0.3–1.2)
Total Protein: 7.8 g/dL (ref 6.5–8.1)

## 2019-09-29 LAB — CBC
HCT: 46.8 % (ref 39.0–52.0)
Hemoglobin: 15.6 g/dL (ref 13.0–17.0)
MCH: 28.7 pg (ref 26.0–34.0)
MCHC: 33.3 g/dL (ref 30.0–36.0)
MCV: 86.2 fL (ref 80.0–100.0)
Platelets: 263 10*3/uL (ref 150–400)
RBC: 5.43 MIL/uL (ref 4.22–5.81)
RDW: 12.3 % (ref 11.5–15.5)
WBC: 14.4 10*3/uL — ABNORMAL HIGH (ref 4.0–10.5)
nRBC: 0 % (ref 0.0–0.2)

## 2019-09-29 LAB — URINALYSIS, COMPLETE (UACMP) WITH MICROSCOPIC
Bilirubin Urine: NEGATIVE
Glucose, UA: NEGATIVE mg/dL
Hgb urine dipstick: NEGATIVE
Ketones, ur: 5 mg/dL — AB
Nitrite: POSITIVE — AB
Protein, ur: 30 mg/dL — AB
Specific Gravity, Urine: 1.016 (ref 1.005–1.030)
Squamous Epithelial / HPF: NONE SEEN (ref 0–5)
WBC, UA: >50 WBC/hpf — ABNORMAL HIGH (ref 0–5)
pH: 5 (ref 5.0–8.0)

## 2019-09-29 LAB — LIPASE, BLOOD: Lipase: 29 U/L (ref 11–51)

## 2019-09-29 MED ORDER — SODIUM CHLORIDE 0.9% FLUSH: 3.0000 mL | Freq: Once | INTRAVENOUS | Status: DC

## 2019-09-29 MED ORDER — CEPHALEXIN 500 MG PO CAPS
500.0000 mg | ORAL_CAPSULE | Freq: Once | ORAL | Status: AC
  Administered 2019-09-30: 500 mg via ORAL
  Filled 2019-09-29: qty 1

## 2019-09-29 NOTE — ED Notes (Addendum)
Upon ambulating to exam room, pt slips and falls backwards landing on buttocks; pt carrying 2L soda that is leaking and dripping off his coat,pt able to stand unassisted and cont to exam room; pt c/o rt knee pain--no injuries noted; charge nurse and MD notified

## 2019-09-29 NOTE — ED Provider Notes (Signed)
[facility] Emergency Department Provider Note  ____________________________________________   First MD Initiated Contact with Patient 09/29/19 2331     (approximate)  I have reviewed the triage vital signs and the nursing notes.   HISTORY  Chief Complaint Abdominal Pain    HPI Rick [name] is a 79 y.o. male with medical history as listed below who notably has had numerous abdominal surgeries in the past including cholecystectomy, appendectomy, hernia repairs, and multiple colon surgeries.  He presents tonight for evaluation of pain in the right upper quadrant of his abdomen.   He thinks that he bumped against a countertop which caused it to hurt.  This happened much earlier in the day and it is been a mild dull ache since that time.  Nothing in particular makes it better or worse.  He has been walking around without any difficulty and eating and drinking without any problems but he thought he should get it checked out because of all the problems he has had in the past.  No nausea no vomiting.  No fever/chills, sore throat, chest pain, shortness of breath, cough.  Of note, he was drinking a 2 L bottle of soda in the lobby over about the last 5 hours while waiting room, and while walking to the exam room to be seen he dropped the bottle and somehow tripped and landed on his left knee.  He is reporting some mild pain in his left knee after the fall.     Past Medical History:  Diagnosis Date  . Anxiety   . Bipolar disorder ([place])   . Bladder cancer ([place])   . BPH (benign prostatic hypertrophy)   . Chest pain, atypical 06/23/2015  . Closed trimalleolar fracture of left ankle 10/01/2015  . Cystitis    hx of   . Depression   . Diabetes mellitus without complication ([place])    type 2   . Difficult or painful urination 10/31/2012  . Dysrhythmia   . Ear problem 03/24/2015  . Gangrenous appendicitis   . H/O urinary disorder 03/27/2013  . Herpes genitalis in men     . Hyperlipidemia   . Hypertension   . Insomnia   . Kidney stones   . Left hand pain 05/18/2015  . Mass of arm 02/10/2015  . Peripheral neuropathy   . Skin cancer   . UD (urethral discharge) 10/31/2012  . Urinary tract infection    hx of     Patient Active Problem List   Diagnosis Date Noted  . Tremor 05/28/2018  . BPH (benign prostatic hyperplasia) 03/22/2018  . Recurrent UTI 11/29/2017  . Osteoarthritis of multiple joints 08/23/2017  . PVD (peripheral vascular disease) ([place]) 05/14/2017  . Hard of hearing 12/07/2016  . History of tobacco abuse 12/07/2016  . History of ulcerative colitis 12/07/2016  . Ventral hernia 12/21/2015  . History of colon cancer, no staging 12/21/2015  . History of bladder cancer 08/05/2015  . Benign fibroma of prostate 08/05/2015  . History of alcoholism ([place]) 08/05/2015  . History of migraine headaches 08/05/2015  . Calculus of kidney 08/05/2015  . Hypertension 05/11/2015  . Hyperlipidemia 05/11/2015  . Affective bipolar disorder ([place]) 03/24/2015  . Diabetic peripheral neuropathy associated with type 2 diabetes mellitus ([place]) 03/24/2015  . Insomnia 03/24/2015  . History of abdominal hernia 02/10/2015  . Anxiety and depression 02/10/2015  . Generalized anxiety disorder 05/18/2014  . Chronic prostatitis 06/12/2013  . Herpesviral infection of penis 11/07/2012  . Incomplete bladder emptying  11/07/2012  . Incisional hernia 11/07/2012  . Balanoposthitis 10/31/2012  . Benign localized prostatic hyperplasia with lower urinary tract symptoms (LUTS) 10/31/2012  . ED (erectile dysfunction) of organic origin 10/31/2012  . History of cancer of ureter 10/31/2012    Past Surgical History:  Procedure Laterality Date  . APPENDECTOMY     RUPTURED  . BLADDER SURGERY    . CHOLECYSTECTOMY    . COLOSTOMY     AND LATER CLOSURE  . CYSTOSCOPY/URETEROSCOPY/HOLMIUM LASER/STENT PLACEMENT Left 03/22/2018   Procedure: CYSTOSCOPY/LEFT URETEROSCOPY/LEFT RETROGRADE  PYELOGRAM;  Surgeon: Lucas Mallow, MD;  Location: [facility];  Service: Urology;  Laterality: Left;  . EPIDIDYMECTOMY N/A 01/03/2019   Procedure: RIGHT EPIDIDYMAL CYST REMOVAL VERSES EPIDIDMECTOMY;  Surgeon: Lucas Mallow, MD;  Location: [facility];  Service: Urology;  Laterality: N/A;  . FRACTURE SURGERY    . HERNIA REPAIR    . INNER EAR SURGERY Left   . ORIF ANKLE FRACTURE Left 09/27/2015   Procedure: OPEN REDUCTION INTERNAL FIXATION (ORIF) ANKLE FRACTURE;  Surgeon: Corky Mull, MD;  Location: [facility] ORS;  Service: Orthopedics;  Laterality: Left;  . SKIN CANCER EXCISION    . TRANSURETHRAL RESECTION OF PROSTATE N/A 03/22/2018   Procedure: TRANSURETHRAL RESECTION OF THE PROSTATE (TURP);  Surgeon: Lucas Mallow, MD;  Location: [facility];  Service: Urology;  Laterality: N/A;  . VENTRAL HERNIA REPAIR N/A 01/14/2016   Procedure: HERNIA REPAIR VENTRAL ADULT;  Surgeon: Leonie Green, MD;  Location: [facility] ORS;  Service: General;  Laterality: N/A;    Prior to Admission medications   Medication Sig Start Date End Date Taking? Authorizing Provider  amoxicillin-clavulanate (AUGMENTIN) 875-125 MG tablet Take 1 tablet by mouth 2 (two) times daily. 07/28/19   Johnson, Megan P, DO  Blood Glucose Monitoring Suppl (ONE TOUCH ULTRA SYSTEM KIT) w/Device KIT 1 kit by Does not apply route once. 02/11/16   Kathrine Haddock, NP  carbidopa-levodopa (SINEMET IR) 25-100 MG tablet Take 1 tablet by mouth 4 (four) times daily.  10/11/18 12/24/19  [provider]  carvedilol (COREG) 6.25 MG tablet Take 1 tablet (6.25 mg total) by mouth 2 (two) times daily with a meal. 04/10/19 04/09/20  Johnson, Megan P, DO  cephALEXin (KEFLEX) 500 MG capsule Take 1 capsule (500 mg total) by mouth 4 (four) times daily for 12 days. 09/29/19 10/11/19  Hinda Kehr, MD  diclofenac sodium (VOLTAREN) 1 % GEL Apply 2 g topically 4 (four) times daily. Patient taking differently: Apply 2 g topically 4 (four) times daily as needed (lower back  pain.).  08/20/17   Volney American, PA-C  enalapril (VASOTEC) 10 MG tablet Take 1 tablet (10 mg total) by mouth daily. 04/10/19   Johnson, Megan P, DO  fluticasone (FLONASE) 50 MCG/ACT nasal spray Place 2 sprays into both nostrils 2 (two) times daily. 07/08/19   Johnson, Megan P, DO  gabapentin (NEURONTIN) 300 MG capsule Take 2 capsules (600 mg total) by mouth 2 (two) times daily. 04/10/19   Johnson, Megan P, DO  hydrochlorothiazide (HYDRODIURIL) 25 MG tablet TAKE ONE (1) TABLET EACH DAY 04/10/19   Johnson, Megan P, DO  Insulin Glargine (BASAGLAR KWIKPEN) 100 UNIT/ML SOPN Inject 0.15 mLs (15 Units total) into the skin daily. 08/05/19   Johnson, Megan P, DO  Insulin Pen Needle (ULTICARE PEN NEEDLES) 29G X 12MM MISC USE AS DIRECTED 08/05/19   Johnson, Megan P, DO  Lancets [facility] ULTRASOFT) lancets TEST BLOOD SUGAR THREE TIMES DAILY 04/22/19   Wynetta Emery,  Megan P, DO  lovastatin (MEVACOR) 40 MG tablet Take 1 tablet (40 mg total) by mouth daily. 04/10/19   Johnson, Megan P, DO  Menthol, Topical Analgesic, (BENGAY EX) Apply 1 application topically 3 (three) times daily as needed (lower back pain.).    [provider]  metFORMIN (GLUCOPHAGE) 1000 MG tablet TAKE 1 TABLET BY MOUTH TWICE A DAY WITH A MEAL 04/10/19   Johnson, Megan P, DO  mirtazapine (REMERON) 15 MG tablet Take 1 tablet (15 mg total) by mouth at bedtime. 04/10/19   Johnson, Megan P, DO  ONETOUCH ULTRA test strip TEST BLOOD SUGAR THREE TIMES DAILY. 04/22/19   Park Liter P, DO  silodosin (RAPAFLO) 8 MG CAPS capsule Take 1 capsule (8 mg total) by mouth daily with breakfast. 05/28/18   Wynetta Emery, Megan P, DO  traZODone (DESYREL) 100 MG tablet Take 1 tablet (100 mg total) by mouth at bedtime. 04/10/19   Johnson, Megan P, DO  valACYclovir (VALTREX) 1000 MG tablet Take 1 tablet (1,000 mg total) by mouth daily as needed. 04/10/19   Park Liter P, DO    Allergies Blood-group specific substance, Haloperidol, and Sulfa antibiotics  Family  History  Problem Relation Age of Onset  . Transient ischemic attack Mother   . Diabetes Father   . Cancer Brother     Social History Social History   Tobacco Use  . Smoking status: Former Smoker    Packs/day: 1.50    Years: 15.00    Pack years: 22.50    Types: Cigarettes    Start date: 08/14/1994    Quit date: 03/23/2005    Years since quitting: 14.5  . Smokeless tobacco: Former User    Types: Snuff, Sarina Ser    Quit date: 03/23/2010  Substance Use Topics  . Alcohol use: No    Alcohol/week: 0.0 standard drinks  . Drug use: No    Review of Systems Constitutional: No fever/chills Eyes: No visual changes. ENT: No sore throat. Cardiovascular: Denies chest pain. Respiratory: Denies shortness of breath. Gastrointestinal: No abdominal pain.  No nausea, no vomiting.  No diarrhea.  No constipation. Genitourinary: +dysuria. Musculoskeletal: Negative for neck pain.  Negative for back pain. Integumentary: Negative for [name]. Neurological: Negative for headaches, focal weakness or numbness.   ____________________________________________   PHYSICAL EXAM:  VITAL SIGNS: ED Triage Vitals [09/29/19 1924]  Enc Vitals Group     BP 125/67     Pulse Rate (!) 101     Resp 16     Temp (!) 97.5 F (36.4 C)     Temp Source Oral     SpO2 97 %     Weight 79.4 kg (175 lb)     Height 1.727 m ('5\' 8"'$ )     Head Circumference      Peak Flow      Pain Score 7     Pain Loc      Pain Edu?      Excl. in [place]?     Constitutional: Alert and oriented.  Very talkative and interactive. Eyes: Conjunctivae are normal.  Head: Atraumatic. Nose: No congestion/rhinnorhea. Mouth/Throat: Patient is wearing a mask. Neck: No stridor.  No meningeal signs.   Cardiovascular: Normal rate, regular rhythm. Good peripheral circulation. Grossly normal heart sounds. Respiratory: Normal respiratory effort.  No retractions. Gastrointestinal: Soft with an easily reducible ventral hernia and extensive past surgical  scarring from all of his old surgeries.  He has no tenderness to palpation throughout the abdomen and continue giving a  detailed history throughout my exam even with deep palpation.  No right upper quadrant tenderness, negative Murphy sign, no rebound and no guarding. Musculoskeletal: No lower extremity tenderness nor edema. No gross deformities of extremities.  The patient has a small bit of erythema on the lateral aspect of his left knee consistent with his recent fall, but he has no pain with flexion and extension of his knee, both passive and active, and no joint instability.  No concern for bony abnormality. neurologic:  Normal speech and language. No gross focal neurologic deficits are appreciated.  Skin:  Skin is warm, dry and intact. Psychiatric: Mood and affect are normal. Speech and behavior are normal.  ____________________________________________   LABS (all labs ordered are listed, but only abnormal results are displayed)  Labs Reviewed  COMPREHENSIVE METABOLIC PANEL - Abnormal; Notable for the following components:      Result Value   Glucose, Bld 139 (*)    BUN 26 (*)    Total Bilirubin 1.4 (*)    All other components within normal limits  CBC - Abnormal; Notable for the following components:   WBC 14.4 (*)    All other components within normal limits  URINALYSIS, COMPLETE (UACMP) WITH MICROSCOPIC - Abnormal; Notable for the following components:   Color, Urine YELLOW (*)    APPearance CLOUDY (*)    Ketones, ur 5 (*)    Protein, ur 30 (*)    Nitrite POSITIVE (*)    Leukocytes,Ua LARGE (*)    WBC, UA >50 (*)    Bacteria, UA MANY (*)    Non Squamous Epithelial PRESENT (*)    All other components within normal limits  URINE CULTURE  LIPASE, BLOOD   ____________________________________________  EKG  No indication for emergent EKG ____________________________________________  RADIOLOGY I, Hinda Kehr, personally viewed and evaluated these images (plain  radiographs) as part of my medical decision making, as well as reviewing the written report by the radiologist.  ED MD interpretation: No indication for emergent imaging  Official radiology report(s): No results found.  ____________________________________________   PROCEDURES   Procedure(s) performed (including Critical Care):  Procedures   ____________________________________________   INITIAL IMPRESSION / MDM / [place] / ED COURSE  As part of my medical decision making, I reviewed the following data within the [place] notes reviewed and incorporated, Labs reviewed , Old chart reviewed and Notes from prior ED visits   Differential diagnosis includes, but is not limited to, musculoskeletal pain/contusion, liver injury such as liver fracture or hemorrhage, SBO/ileus.  The patient is having no signs or symptoms of an obstruction, is ambulating without difficulty, has been eating and drinking today including a 2 L bottle of soda in the lobby while waiting in exam room.  He is in no distress and had absolutely no tenderness to palpation on 2 separate abdominal exams during the course of our history and physical.  His ventral hernia is chronic and easily reducible and causes him no discomfort.  There is no indication for any emergent imaging at this time.  He is comfortable with the understanding that he bumped himself and although he has no visible bruising he also has no reproducible abdominal pain or tenderness and is ambulating without any distress.  His labs are notable for mild leukocytosis and his urine is clearly positive for infection.  I have sent a urine culture and he is comfortable with plan for antibiotics.  He has had urological issues in the past  but no longer has a urologist I provided the name and number of Dr. Yves Dill whom he has not seen in the past but he also has some reservations about going back to [facility] urological because of  what he describes as a negative interaction in the past.  He knows he needs to take his full course of antibiotics and follow-up with his regular doctors and/or with urology.  I gave my usual and customary return precautions.          ____________________________________________  FINAL CLINICAL IMPRESSION(S) / ED DIAGNOSES  Final diagnoses:  Urinary tract infection without hematuria, site unspecified  Right upper quadrant abdominal pain     MEDICATIONS GIVEN DURING THIS VISIT:  Medications  cephALEXin (KEFLEX) capsule 500 mg (has no administration in time range)     ED Discharge Orders         Ordered    cephALEXin (KEFLEX) 500 MG capsule  4 times daily     09/30/19 0000          *Please note:  Rick [name] was evaluated in Emergency Department on 09/30/2019 for the symptoms described in the history of present illness. He was evaluated in the context of the global COVID-19 pandemic, which necessitated consideration that the patient might be at risk for infection with the SARS-CoV-2 virus that causes COVID-19. Institutional protocols and algorithms that pertain to the evaluation of patients at risk for COVID-19 are in a state of rapid change based on information released by regulatory bodies including the CDC and federal and state organizations. These policies and algorithms were followed during the patient's care in the ED.  Some ED evaluations and interventions may be delayed as a result of limited staffing during the pandemic.*  Note:  This document was prepared using Dragon voice recognition software and may include unintentional dictation errors.   Hinda Kehr, MD 09/30/19 0000

## 2019-09-29 NOTE — ED Triage Notes (Signed)
PT to ED reporting upper right quadrant abd pain x 1 week. Pain worsens with movement. Swelling noted where pt is pointing but pt reports hx of a "fatty tumor." No NVD  Pt has had multiple abd surgeries in the past but nothing recently.

## 2019-09-29 NOTE — Patient Outreach (Signed)
[facility] [facility]) Care Management  09/29/2019  Rick [name] 30-Mar-1941 [id number]  Incoming call received from patient in regards to [place] application for [employer].  Spoke to patient, HIPAA identifiers verified.  Patient was calling to inquire if I received his application back in the mail. Informed patient that his application was received and the application was submitted to [place]. Informed him that I would follow up with him once [name] has made a determination in about a week to 10 business days. Patient verbalized understanding.  Will follow up with [name] as previously scheduled.  Zali Kamaka P. Demitrus Francisco, [place] Management [phone]

## 2019-09-29 NOTE — Discharge Instructions (Addendum)
Your workup today suggests that you have a urinary tract infection (UTI). ° °Please take your antibiotic as prescribed and over-the-counter pain medication (Tylenol or Motrin) as needed, but no more than recommended on the label instructions.  Drink PLENTY of fluids. ° °Call your regular doctor to schedule the next available appointment to follow up on today’s ED visit, or return immediately to the ED if your pain worsens, you have decreased urine production, develop fever, persistent vomiting, or other symptoms that concern you. ° °

## 2019-09-29 NOTE — ED Notes (Signed)
Pt is ambulatory in lobby without difficulty or distress, eating snacks and drinking soda, however pt has been instructed not to eat or drink

## 2019-09-30 MED ORDER — CEPHALEXIN 500 MG PO CAPS: 500.0000 mg | ORAL_CAPSULE | Freq: Four times a day (QID) | ORAL | 0 refills | Status: AC

## 2019-09-30 NOTE — ED Notes (Signed)
Pt to the er for ruq abd pain and now left knee pain. No pain with palpation anywhere. Pt needs f/u with urology. Pt has extensive urology hx.

## 2019-10-01 LAB — URINE CULTURE: Culture: 90000 — AB

## 2019-10-06 ENCOUNTER — Other Ambulatory Visit: Payer: Self-pay | Admitting: Pharmacy Technician

## 2019-10-06 NOTE — Patient Outreach (Signed)
[place] [facility]) Care Management  10/06/2019  WILBURN LOTSPEICH 1941-01-09 [id number]  Care coordination call placed to [place] in regards to patient's application for  [place].  Spoke to [place] who was able to locate the fax that was sent it. She informed she would send it to the processing team and to check back sometime next week.  Will followup with [name] in 5-7 business days.  Derryl Uher P. Christabelle Hanzlik, [place] Management [phone]

## 2019-10-08 ENCOUNTER — Other Ambulatory Visit: Payer: Self-pay | Admitting: Pharmacy Technician

## 2019-10-08 NOTE — Patient Outreach (Signed)
[place] [facility]) Care Management  10/08/2019  Rick [name] [birth year] [id number]   Successful call placed to patient regarding patient assistance application(s) for [place] with [name] , HIPAA identifiers verified.   Informed patient that he was approved for the [place] cares program for [employer]. Informed him the company would be outreaching him to set up his delivery. He informed he may not be near the phone but if he was then he would answer it. He inquired if medication could be delivered to the PCP office and informed he to speak to [facility] about that when they call him. Patient informed he does not need a shipment now but would accept the call if he hears the phone ring. Also provided patient the phone number to call and set up his delivery if he missed the call or did not currently need any medication.   Confirmed patient had name and number.  Follow up:  Will route note to embedded [place] for case closure  And will remove myself from care team.  Rick [name]. Rick [name], [place] Management [phone]

## 2019-10-08 NOTE — Patient Outreach (Signed)
[place] [facility]) Care Management  10/08/2019  Rick [name] September 04, 1940 [id number]   Care coordination call placed to [place] in regards to patient's application for [place].  Spoke to [place] who informed patient was APPROVED 10/07/2019-08/13/2020. She informed that [name] would be outreaching patient to set up his delivery.  Will outreach patient with this information.  Galdino Hinchman P. Rossie Bretado, [place] Management [phone]

## 2019-10-10 ENCOUNTER — Ambulatory Visit: Payer: Medicare Other | Admitting: Family Medicine

## 2019-10-12 ENCOUNTER — Ambulatory Visit: Payer: Medicare Other | Attending: Internal Medicine

## 2019-10-12 DIAGNOSIS — Z23 Encounter for immunization: Secondary | ICD-10-CM

## 2019-10-12 NOTE — Progress Notes (Signed)
   Covid-19 Vaccination Clinic  Name:  Rick [name]    MRN: [id number] DOB: 02/12/1941  10/12/2019  [name] was observed post Covid-19 immunization for 15 minutes without incidence. He was provided with Vaccine Information Sheet and instruction to access the V-Safe system.   [name] was instructed to call 911 with any severe reactions post vaccine: [name] Difficulty breathing  . Swelling of your face and throat  . A fast heartbeat  . A bad rash all over your body  . Dizziness and weakness    Immunizations Administered    Name Date Dose VIS Date Route   Pfizer COVID-19 Vaccine 10/12/2019  2:12 PM 0.3 mL 07/25/2019 Intramuscular   Manufacturer: [place]   Lot: [id number]   [place]: [id number]

## 2019-11-04 ENCOUNTER — Ambulatory Visit: Payer: Medicare Other

## 2019-11-05 ENCOUNTER — Ambulatory Visit (INDEPENDENT_AMBULATORY_CARE_PROVIDER_SITE_OTHER): Payer: Medicare Other | Admitting: Pharmacist

## 2019-11-05 DIAGNOSIS — I10 Essential (primary) hypertension: Secondary | ICD-10-CM

## 2019-11-05 DIAGNOSIS — E1142 Type 2 diabetes mellitus with diabetic polyneuropathy: Secondary | ICD-10-CM

## 2019-11-05 NOTE — Chronic Care Management (AMB) (Signed)
Chronic Care Management   Follow Up Note   11/05/2019 Name: Rick [name] MRN: [id number] DOB: Oct 14, 1940  Referred by: Rick Roys, DO Reason for referral : Chronic Care Management (Medication Management)   Rick [name] is a 79 y.o. year old male who is a primary care patient of Rick Roys, DO. The CCM team was consulted for assistance with chronic disease management and care coordination needs.    Contacted patient for medication management review.  Review of patient status, including review of consultants reports, relevant laboratory and other test results, and collaboration with appropriate care team members and the patient's provider was performed as part of comprehensive patient evaluation and provision of chronic care management services.    SDOH (Social Determinants of Health) assessments performed: Yes See Care Plan activities for detailed interventions related to [facility])     Outpatient Encounter Medications as of 11/05/2019  Medication Sig  . carbidopa-levodopa (SINEMET IR) 25-100 MG tablet Take 1 tablet by mouth 4 (four) times daily.   . carvedilol (COREG) 6.25 MG tablet Take 1 tablet (6.25 mg total) by mouth 2 (two) times daily with a meal.  . enalapril (VASOTEC) 10 MG tablet Take 1 tablet (10 mg total) by mouth daily.  . Insulin Glargine (BASAGLAR KWIKPEN) 100 UNIT/ML SOPN Inject 0.15 mLs (15 Units total) into the skin daily.  . Insulin Pen Needle (ULTICARE PEN NEEDLES) 29G X 12MM MISC USE AS DIRECTED  . Lancets (ONETOUCH ULTRASOFT) lancets TEST BLOOD SUGAR THREE TIMES DAILY  . lovastatin (MEVACOR) 40 MG tablet Take 1 tablet (40 mg total) by mouth daily.  . Menthol, Topical Analgesic, (BENGAY EX) Apply 1 application topically 3 (three) times daily as needed (lower back pain.).  Rick [name] metFORMIN (GLUCOPHAGE) 1000 MG tablet TAKE 1 TABLET BY MOUTH TWICE A DAY WITH A MEAL  . [DISCONTINUED] amoxicillin-clavulanate (AUGMENTIN) 875-125 MG tablet Take 1 tablet by mouth 2  (two) times daily.  . Blood Glucose Monitoring Suppl (ONE TOUCH ULTRA SYSTEM KIT) w/Device KIT 1 kit by Does not apply route once.  . diclofenac sodium (VOLTAREN) 1 % GEL Apply 2 g topically 4 (four) times daily. (Patient taking differently: Apply 2 g topically 4 (four) times daily as needed (lower back pain.). )  . fluticasone (FLONASE) 50 MCG/ACT nasal spray Place 2 sprays into both nostrils 2 (two) times daily.  Rick [name] gabapentin (NEURONTIN) 300 MG capsule Take 2 capsules (600 mg total) by mouth 2 (two) times daily.  . hydrochlorothiazide (HYDRODIURIL) 25 MG tablet TAKE ONE (1) TABLET EACH DAY (Patient not taking: Reported on 11/05/2019)  . mirtazapine (REMERON) 15 MG tablet Take 1 tablet (15 mg total) by mouth at bedtime.  Rick [name] ULTRA test strip TEST BLOOD SUGAR THREE TIMES DAILY.  . silodosin (RAPAFLO) 8 MG CAPS capsule Take 1 capsule (8 mg total) by mouth daily with breakfast.  . traZODone (DESYREL) 100 MG tablet Take 1 tablet (100 mg total) by mouth at bedtime.  . valACYclovir (VALTREX) 1000 MG tablet Take 1 tablet (1,000 mg total) by mouth daily as needed.   No facility-administered encounter medications on file as of 11/05/2019.     Objective:   Goals Addressed            This Visit's Progress     Patient Stated   . COMPLETED: PharmD "I want to take care of my diabetes" (pt-stated)       CARE PLAN ENTRY (see longtitudinal plan of care for additional care plan information)  Current  Barriers:  . Diabetes: controlled; most recent A1c 6.2% . Current antihyperglycemic regimen: metformin 1000 mg BID, Lantus 15 units daily o Previously on glipizide; d/t A1c <6% in August, Dr. Wynetta Emery d/c glipizide. Patient notes that he is still taking glipizide 2.5 mg daily, notes today that he would like his A1c to be <6.3% o Receiving Basaglar from [employer] through 08/13/20 . Denies episodes of hypoglycemia, but declines to review BG readings with me today . Cardiovascular risk  reduction: o Current hypertensive regimen: prescribed HCTZ 25 mg daily (no fill hx in last 6 months), enalapril 10 mg daily (last filled 07/21/19 for 90 day supply), and carvedilol 6.25 mg BID (last fill 08/11/19 for 90 day supply). Reports that he will self-adjust BP medications to keep BP <130/80, including using HCTZ PRN and sometimes taking carvedilol TID. Notes that he doesn't like taking HCTZ because of how if "affects my kidneys" and his worry about kidney/bladder infections o Current hyperlipidemia regimen: lovastatin 40 mg daily; last LDL not quite at goal <100  Pharmacist Clinical Goal(s):  Rick [name] Over the next 90 days, patient will work with PharmD and primary care provider to address optimized medication management   Interventions: . Comprehensive medication review performed, medication list updated in electronic medical record . Reviewed goal A1c <7%, and that lower goals may be more detrimental d/t increasing risk for hypoglycemia. Patient adamant that his goal is <6.3% and that he will demand a glipizide refill if A1c comes back higher than that . Reviewed goal BP <130/80 is appropriate if not causing hypotension, though noted that I did not recommend patient take his antihypertensives in a self-adjusted fashion. Patient adamant that he is in control of his BP and has been monitoring faithfully for years.  . Educated that HCTZ is not damaging to the kidneys and does not increase his risk for genitourinary infections. . Encouraged to reschedule f/u with Dr. Wynetta Emery, as appointment in Feb was cancelled. Patient reports that he has nothing wrong that he needs to see Dr. Wynetta Emery about, but will probably schedule f/u in the next few weeks.  . Denies any other medication questions or concerns today.   Patient Self Care Activities:  . Patient will check blood glucose daily , document, and provide at future appointments . Patient will take medications as prescribed . Patient will report any  questions or concerns to provider   Please see past updates related to this goal by clicking on the "Past Updates" button in the selected goal          Plan:  - Patient has my contact information for future questions or concerns.   Catie Darnelle Maffucci, PharmD, [place] [phone]

## 2019-11-05 NOTE — Patient Instructions (Signed)
Visit Information  Goals Addressed            This Visit's Progress     Patient Stated   . COMPLETED: PharmD "I want to take care of my diabetes" (pt-stated)       CARE PLAN ENTRY (see longtitudinal plan of care for additional care plan information)  Current Barriers:  . Diabetes: controlled; most recent A1c 6.2% . Current antihyperglycemic regimen: metformin 1000 mg BID, Lantus 15 units daily o Previously on glipizide; d/t A1c <6% in August, Dr. Wynetta Emery d/c glipizide. Patient notes that he is still taking glipizide 2.5 mg daily, notes today that he would like his A1c to be <6.3% o Receiving Basaglar from [employer] through 08/13/20 . Denies episodes of hypoglycemia, but declines to review BG readings with me today . Cardiovascular risk reduction: o Current hypertensive regimen: prescribed HCTZ 25 mg daily (no fill hx in last 6 months), enalapril 10 mg daily (last filled 07/21/19 for 90 day supply), and carvedilol 6.25 mg BID (last fill 08/11/19 for 90 day supply). Reports that he will self-adjust BP medications to keep BP <130/80, including using HCTZ PRN and sometimes taking carvedilol TID o Current hyperlipidemia regimen: lovastatin 40 mg daily; last LDL not quite at goal <100  Pharmacist Clinical Goal(s):  [name] Over the next 90 days, patient will work with PharmD and primary care provider to address optimized medication management   Interventions: . Comprehensive medication review performed, medication list updated in electronic medical record . Reviewed goal A1c <7%, and that lower goals may be more detrimental d/t increasing risk for hypoglycemia. Patient adamant that his goal is <6.3% and that he will demand a glipizide refill if A1c comes back higher than that . Reviewed goal BP <130/80 is appropriate if not causing hypotension, though noted that I did not recommend patient take his antihypertensives in a self-adjusted fashion. Patient adamant that he is in control of his BP and  has been monitoring faithfully for years.  . Encouraged to reschedule f/u with Dr. Wynetta Emery, as appointment in Feb was cancelled. Patient reports that he has nothing wrong that he needs to see Dr. Wynetta Emery about, but will probably schedule f/u in the next few weeks.  . Denies any other medication questions or concerns today.   Patient Self Care Activities:  . Patient will check blood glucose daily , document, and provide at future appointments . Patient will take medications as prescribed . Patient will report any questions or concerns to provider   Please see past updates related to this goal by clicking on the "Past Updates" button in the selected goal         Patient verbalizes understanding of instructions provided today.    Plan:  - Patient has my contact information for future questions or concerns.   Catie Darnelle Maffucci, PharmD, [place] [phone]

## 2019-11-26 ENCOUNTER — Telehealth: Payer: Self-pay | Admitting: *Deleted

## 2019-11-26 NOTE — Telephone Encounter (Signed)
Patient called in today and states he would like a copy of his medical records.

## 2019-11-27 DIAGNOSIS — K117 Disturbances of salivary secretion: Secondary | ICD-10-CM | POA: Diagnosis not present

## 2019-11-27 DIAGNOSIS — R413 Other amnesia: Secondary | ICD-10-CM | POA: Diagnosis not present

## 2019-11-27 DIAGNOSIS — R251 Tremor, unspecified: Secondary | ICD-10-CM | POA: Diagnosis not present

## 2019-12-23 ENCOUNTER — Other Ambulatory Visit: Payer: Self-pay

## 2019-12-23 ENCOUNTER — Ambulatory Visit (INDEPENDENT_AMBULATORY_CARE_PROVIDER_SITE_OTHER): Payer: Medicare Other | Admitting: Family Medicine

## 2019-12-23 ENCOUNTER — Encounter: Payer: Self-pay | Admitting: Family Medicine

## 2019-12-23 VITALS — BP 145/87 | HR 94 | Temp 98.7°F | Wt 170.0 lb

## 2019-12-23 DIAGNOSIS — E785 Hyperlipidemia, unspecified: Secondary | ICD-10-CM | POA: Diagnosis not present

## 2019-12-23 DIAGNOSIS — R300 Dysuria: Secondary | ICD-10-CM

## 2019-12-23 DIAGNOSIS — E782 Mixed hyperlipidemia: Secondary | ICD-10-CM

## 2019-12-23 DIAGNOSIS — I10 Essential (primary) hypertension: Secondary | ICD-10-CM | POA: Diagnosis not present

## 2019-12-23 DIAGNOSIS — E1142 Type 2 diabetes mellitus with diabetic polyneuropathy: Secondary | ICD-10-CM | POA: Diagnosis not present

## 2019-12-23 DIAGNOSIS — I739 Peripheral vascular disease, unspecified: Secondary | ICD-10-CM

## 2019-12-23 DIAGNOSIS — F3171 Bipolar disorder, in partial remission, most recent episode hypomanic: Secondary | ICD-10-CM

## 2019-12-23 LAB — MICROALBUMIN, URINE WAIVED
Creatinine, Urine Waived: 10 mg/dL (ref 10–300)
Microalb, Ur Waived: 10 mg/L (ref 0–19)

## 2019-12-23 LAB — BAYER DCA HB A1C WAIVED: HB A1C (BAYER DCA - WAIVED): 6.2 % (ref <7.0)

## 2019-12-23 MED ORDER — TRAZODONE HCL 100 MG PO TABS: 100.0000 mg | ORAL_TABLET | Freq: Every day | ORAL | 1 refills | Status: DC

## 2019-12-23 MED ORDER — METFORMIN HCL ER 500 MG PO TB24: 1000.0000 mg | ORAL_TABLET | Freq: Two times a day (BID) | ORAL | 1 refills | Status: DC

## 2019-12-23 MED ORDER — VALACYCLOVIR HCL 1 G PO TABS: 1000.0000 mg | ORAL_TABLET | Freq: Every day | ORAL | 1 refills | Status: DC | PRN

## 2019-12-23 MED ORDER — MIRTAZAPINE 15 MG PO TABS: 15.0000 mg | ORAL_TABLET | Freq: Every day | ORAL | 1 refills | Status: DC

## 2019-12-23 MED ORDER — HYDROCHLOROTHIAZIDE 25 MG PO TABS: ORAL_TABLET | ORAL | 1 refills | Status: DC

## 2019-12-23 MED ORDER — CARVEDILOL 6.25 MG PO TABS: 6.2500 mg | ORAL_TABLET | Freq: Two times a day (BID) | ORAL | 1 refills | Status: DC

## 2019-12-23 MED ORDER — SILODOSIN 8 MG PO CAPS: 8.0000 mg | ORAL_CAPSULE | Freq: Every day | ORAL | 1 refills | Status: DC

## 2019-12-23 MED ORDER — LOVASTATIN 40 MG PO TABS: 40.0000 mg | ORAL_TABLET | Freq: Every day | ORAL | 1 refills | Status: DC

## 2019-12-23 MED ORDER — BASAGLAR KWIKPEN 100 UNIT/ML ~~LOC~~ SOPN: 15.0000 [IU] | PEN_INJECTOR | Freq: Every day | SUBCUTANEOUS | 3 refills | Status: DC

## 2019-12-23 MED ORDER — GABAPENTIN 300 MG PO CAPS: 600.0000 mg | ORAL_CAPSULE | Freq: Two times a day (BID) | ORAL | 1 refills | Status: DC

## 2019-12-23 MED ORDER — ENALAPRIL MALEATE 10 MG PO TABS: 10.0000 mg | ORAL_TABLET | Freq: Every day | ORAL | 1 refills | Status: DC

## 2019-12-23 NOTE — Progress Notes (Signed)
BP (!) 145/87   Pulse 94   Temp 98.7 F (37.1 C) (Oral)   Wt 170 lb (77.1 kg)   SpO2 98%   BMI 25.85 kg/m    Subjective:    Patient ID: Rick [name], male    DOB: May 31, 1941, 79 y.o.   MRN: [id number]  HPI: Rick [name] is a 79 y.o. male  Chief Complaint  Patient presents with  . Urinary Tract Infection    not current, pt is requesting rx for Cephalexin to keep on hand  . Medication Problem    Metformin causing loose stools, getting in underwear causing germs to crawl up urethra causing UTI.  . Diabetes  . Hypertension  . Hyperlipidemia   HYPERTENSION / HYPERLIPIDEMIA Satisfied with current treatment? yes Duration of hypertension: chronic BP monitoring frequency: a few times a day BP range: 120s/70s BP medication side effects: no Past BP meds: enlalapril, HCTZ Duration of hyperlipidemia: chronic Cholesterol medication side effects: no Cholesterol supplements: none Past cholesterol medications: lovastatin Medication compliance: excellent compliance Aspirin: no Recent stressors: no Recurrent headaches: no Visual changes: no Palpitations: no Dyspnea: no Chest pain: no Lower extremity edema: no Dizzy/lightheaded: no  DIABETES Hypoglycemic episodes:no Polydipsia/polyuria: yes Visual disturbance: no Chest pain: no Paresthesias: no Glucose Monitoring: yes  Accucheck frequency: Daily Taking Insulin?: yes Blood Pressure Monitoring: not checking Retinal Examination: Up to Date Foot Exam: Up to Date Diabetic Education: Completed Pneumovax: Up to Date Influenza: Up to Date Aspirin: yes   URINARY SYMPTOMS Duration: Chronic Dysuria: burning Urinary frequency: yes Urgency: yes Small volume voids: yes Symptom severity: moderate Urinary incontinence: yes Foul odor: yes Hematuria: no Abdominal pain: no Back pain: no Suprapubic pain/pressure: yes Flank pain: no Fever:  no Vomiting: no Relief with cranberry juice: no Relief with pyridium:  no Status: stable Previous urinary tract infection: yes Recurrent urinary tract infection: yes History of sexually transmitted disease: no Penile discharge: no Treatments attempted: antibiotics and increasing fluids     Relevant past medical, surgical, family and social history reviewed and updated as indicated. Interim medical history since our last visit reviewed. Allergies and medications reviewed and updated.  Review of Systems  Constitutional: Negative.   HENT: Negative.   Respiratory: Negative.   Cardiovascular: Negative.   Gastrointestinal: Negative.   Genitourinary: Positive for difficulty urinating, dysuria, frequency and urgency. Negative for decreased urine volume, discharge, enuresis, flank pain, genital sores, hematuria, penile pain, penile swelling, scrotal swelling and testicular pain.  Musculoskeletal: Negative.   Neurological: Negative.   Psychiatric/Behavioral: Negative.     Per HPI unless specifically indicated above     Objective:    BP (!) 145/87   Pulse 94   Temp 98.7 F (37.1 C) (Oral)   Wt 170 lb (77.1 kg)   SpO2 98%   BMI 25.85 kg/m   Wt Readings from Last 3 Encounters:  12/23/19 170 lb (77.1 kg)  09/29/19 175 lb (79.4 kg)  01/03/19 169 lb 9 oz (76.9 kg)    Physical Exam Vitals and nursing note reviewed.  Constitutional:      General: He is not in acute distress.    Appearance: Normal appearance. He is not ill-appearing, toxic-appearing or diaphoretic.  HENT:     Head: Normocephalic and atraumatic.     Right Ear: External ear normal.     Left Ear: External ear normal.     Nose: Nose normal.     Mouth/Throat:     Mouth: Mucous membranes are moist.  Pharynx: Oropharynx is clear.  Eyes:     General: No scleral icterus.       Right eye: No discharge.        Left eye: No discharge.     Extraocular Movements: Extraocular movements intact.     Conjunctiva/sclera: Conjunctivae normal.     Pupils: Pupils are equal, round, and reactive  to light.  Cardiovascular:     Rate and Rhythm: Normal rate and regular rhythm.     Pulses: Normal pulses.     Heart sounds: Normal heart sounds. No murmur. No friction rub. No gallop.   Pulmonary:     Effort: Pulmonary effort is normal. No respiratory distress.     Breath sounds: Normal breath sounds. No stridor. No wheezing, rhonchi or rales.  Chest:     Chest wall: No tenderness.  Musculoskeletal:        General: Normal range of motion.     Cervical back: Normal range of motion and neck supple.  Skin:    General: Skin is warm and dry.     Capillary Refill: Capillary refill takes less than 2 seconds.     Coloration: Skin is not jaundiced or pale.     Findings: No bruising, erythema, lesion or rash.  Neurological:     General: No focal deficit present.     Mental Status: He is alert and oriented to person, place, and time. Mental status is at baseline.  Psychiatric:        Mood and Affect: Mood normal.        Behavior: Behavior normal.        Thought Content: Thought content normal.        Judgment: Judgment normal.     Results for orders placed or performed in visit on 12/23/19  Microscopic Examination   URINE  Result Value Ref Range   WBC, UA 6-10 (A) 0 - 5 /hpf   RBC None seen 0 - 2 /hpf   Epithelial Cells (non renal) 0-10 0 - 10 /hpf   Crystals Present N/A   Crystal Type Calcium Oxalate N/A   Bacteria, UA Moderate (A) None seen/Few  Urine Culture, Reflex   URINE  Result Value Ref Range   Urine Culture, Routine Final report (A)    Organism ID, Bacteria Escherichia coli (A)    Antimicrobial Susceptibility Comment   Bayer DCA Hb A1c Waived  Result Value Ref Range   HB A1C (BAYER DCA - WAIVED) 6.2 <7.0 %  CBC with Differential/Platelet  Result Value Ref Range   WBC 7.3 3.4 - 10.8 x10E3/uL   RBC 5.09 4.14 - 5.80 x10E6/uL   Hemoglobin 14.7 13.0 - 17.7 g/dL   Hematocrit 42.6 37.5 - 51.0 %   MCV 84 79 - 97 fL   MCH 28.9 26.6 - 33.0 pg   MCHC 34.5 31.5 - 35.7 g/dL    RDW 14.1 11.6 - 15.4 %   Platelets 252 150 - 450 x10E3/uL   Neutrophils 51 Not Estab. %   Lymphs 37 Not Estab. %   Monocytes 8 Not Estab. %   Eos 3 Not Estab. %   Basos 1 Not Estab. %   Neutrophils Absolute 3.6 1.4 - 7.0 x10E3/uL   Lymphocytes Absolute 2.7 0.7 - 3.1 x10E3/uL   Monocytes Absolute 0.6 0.1 - 0.9 x10E3/uL   EOS (ABSOLUTE) 0.3 0.0 - 0.4 x10E3/uL   Basophils Absolute 0.1 0.0 - 0.2 x10E3/uL   Immature Granulocytes 0 Not Estab. %   Immature  Grans (Abs) 0.0 0.0 - 0.1 x10E3/uL  Comprehensive metabolic panel  Result Value Ref Range   Glucose 119 (H) 65 - 99 mg/dL   BUN 24 8 - 27 mg/dL   Creatinine, Ser 1.24 0.76 - 1.27 mg/dL   GFR calc non Af Amer 55 (L) >59 mL/min/1.73   GFR calc Af Amer 64 >59 mL/min/1.73   BUN/Creatinine Ratio 19 10 - 24   Sodium 140 134 - 144 mmol/L   Potassium 4.3 3.5 - 5.2 mmol/L   Chloride 105 96 - 106 mmol/L   CO2 21 20 - 29 mmol/L   Calcium 9.9 8.6 - 10.2 mg/dL   Total Protein 6.8 6.0 - 8.5 g/dL   Albumin 4.7 3.7 - 4.7 g/dL   Globulin, Total 2.1 1.5 - 4.5 g/dL   Albumin/Globulin Ratio 2.2 1.2 - 2.2   Bilirubin Total 1.0 0.0 - 1.2 mg/dL   Alkaline Phosphatase 62 39 - 117 IU/L   AST 18 0 - 40 IU/L   ALT 13 0 - 44 IU/L  Microalbumin, Urine Waived  Result Value Ref Range   Microalb, Ur Waived 10 0 - 19 mg/L   Creatinine, Urine Waived 10 10 - 300 mg/dL   Microalb/Creat Ratio 30-300 (H) <30 mg/g  Lipid Panel w/o Chol/HDL Ratio  Result Value Ref Range   Cholesterol, Total 171 100 - 199 mg/dL   Triglycerides 109 0 - 149 mg/dL   HDL 48 >39 mg/dL   VLDL Cholesterol Cal 20 5 - 40 mg/dL   LDL Chol Calc (NIH) 103 (H) 0 - 99 mg/dL  UA/M w/rflx Culture, Routine   Specimen: Urine   URINE  Result Value Ref Range   Specific Gravity, UA <1.005 (L) 1.005 - 1.030   pH, UA 5.5 5.0 - 7.5   Color, UA Yellow Yellow   Appearance Ur Clear Clear   Leukocytes,UA 2+ (A) Negative   Protein,UA Negative Negative/Trace   Glucose, UA Negative Negative    Ketones, UA Negative Negative   RBC, UA Negative Negative   Bilirubin, UA Negative Negative   Urobilinogen, Ur 0.2 0.2 - 1.0 mg/dL   Nitrite, UA Negative Negative   Microscopic Examination See below:    Urinalysis Reflex Comment       Assessment & Plan:   Problem List Items Addressed This Visit      Cardiovascular and Mediastinum   Hypertension    Under good control on current regimen. Continue current regimen. Continue to monitor. Call with any concerns. Refills given. Labs drawn today.        Relevant Medications   lovastatin (MEVACOR) 40 MG tablet   hydrochlorothiazide (HYDRODIURIL) 25 MG tablet   enalapril (VASOTEC) 10 MG tablet   carvedilol (COREG) 6.25 MG tablet   Other Relevant Orders   CBC with Differential/Platelet (Completed)   Comprehensive metabolic panel (Completed)   Microalbumin, Urine Waived (Completed)   PVD (peripheral vascular disease) (HCC)    Will keep BP, cholesterol and sugar under good control. Continue to monitor. Call with any concerns.       Relevant Medications   lovastatin (MEVACOR) 40 MG tablet   hydrochlorothiazide (HYDRODIURIL) 25 MG tablet   enalapril (VASOTEC) 10 MG tablet   carvedilol (COREG) 6.25 MG tablet     Endocrine   Diabetic peripheral neuropathy associated with type 2 diabetes mellitus ([place]) - Primary    Under good control on current regimen with A1c of 6.2. Continue current regimen. Continue to monitor. Call with any concerns. Refills given. Labs  drawn today. Having diarrhea on his short acting metformin. Will change to long acting and recheck 1 month.        Relevant Medications   metFORMIN (GLUCOPHAGE XR) 500 MG 24 hr tablet   lovastatin (MEVACOR) 40 MG tablet   enalapril (VASOTEC) 10 MG tablet   gabapentin (NEURONTIN) 300 MG capsule   mirtazapine (REMERON) 15 MG tablet   traZODone (DESYREL) 100 MG tablet   Insulin Glargine (BASAGLAR KWIKPEN) 100 UNIT/ML   Other Relevant Orders   Bayer DCA Hb A1c Waived (Completed)    CBC with Differential/Platelet (Completed)   Comprehensive metabolic panel (Completed)   Microalbumin, Urine Waived (Completed)     Other   Affective bipolar disorder (HCC)    Stable at this time. Call with any concerns. Continue to monitor.       Hyperlipidemia    Under good control on current regimen. Continue current regimen. Continue to monitor. Call with any concerns. Refills given. Labs drawn today.       Relevant Medications   lovastatin (MEVACOR) 40 MG tablet   hydrochlorothiazide (HYDRODIURIL) 25 MG tablet   enalapril (VASOTEC) 10 MG tablet   carvedilol (COREG) 6.25 MG tablet   Other Relevant Orders   CBC with Differential/Platelet (Completed)   Comprehensive metabolic panel (Completed)   Lipid Panel w/o Chol/HDL Ratio (Completed)    Other Visit Diagnoses    Dysuria       History of recurrent UTIs. To see urology in a couple of weeks. Await culture. Treat as needed.    Relevant Orders   UA/M w/rflx Culture, Routine (Completed)   Dyslipidemia       Relevant Medications   lovastatin (MEVACOR) 40 MG tablet       Follow up plan: Return in about 4 weeks (around 01/20/2020) for follow up on metformin.

## 2019-12-24 ENCOUNTER — Encounter: Payer: Self-pay | Admitting: Family Medicine

## 2019-12-24 LAB — CBC WITH DIFFERENTIAL/PLATELET
Basophils Absolute: 0.1 10*3/uL (ref 0.0–0.2)
Basos: 1 %
EOS (ABSOLUTE): 0.3 10*3/uL (ref 0.0–0.4)
Eos: 3 %
Hematocrit: 42.6 % (ref 37.5–51.0)
Hemoglobin: 14.7 g/dL (ref 13.0–17.7)
Immature Grans (Abs): 0 10*3/uL (ref 0.0–0.1)
Immature Granulocytes: 0 %
Lymphocytes Absolute: 2.7 10*3/uL (ref 0.7–3.1)
Lymphs: 37 %
MCH: 28.9 pg (ref 26.6–33.0)
MCHC: 34.5 g/dL (ref 31.5–35.7)
MCV: 84 fL (ref 79–97)
Monocytes Absolute: 0.6 10*3/uL (ref 0.1–0.9)
Monocytes: 8 %
Neutrophils Absolute: 3.6 10*3/uL (ref 1.4–7.0)
Neutrophils: 51 %
Platelets: 252 10*3/uL (ref 150–450)
RBC: 5.09 x10E6/uL (ref 4.14–5.80)
RDW: 14.1 % (ref 11.6–15.4)
WBC: 7.3 10*3/uL (ref 3.4–10.8)

## 2019-12-24 LAB — COMPREHENSIVE METABOLIC PANEL
ALT: 13 IU/L (ref 0–44)
AST: 18 IU/L (ref 0–40)
Albumin/Globulin Ratio: 2.2 (ref 1.2–2.2)
Albumin: 4.7 g/dL (ref 3.7–4.7)
Alkaline Phosphatase: 62 IU/L (ref 39–117)
BUN/Creatinine Ratio: 19 (ref 10–24)
BUN: 24 mg/dL (ref 8–27)
Bilirubin Total: 1 mg/dL (ref 0.0–1.2)
CO2: 21 mmol/L (ref 20–29)
Calcium: 9.9 mg/dL (ref 8.6–10.2)
Chloride: 105 mmol/L (ref 96–106)
Creatinine, Ser: 1.24 mg/dL (ref 0.76–1.27)
GFR calc Af Amer: 64 mL/min/{1.73_m2} (ref >59)
GFR calc non Af Amer: 55 mL/min/{1.73_m2} — ABNORMAL LOW (ref >59)
Globulin, Total: 2.1 g/dL (ref 1.5–4.5)
Glucose: 119 mg/dL — ABNORMAL HIGH (ref 65–99)
Potassium: 4.3 mmol/L (ref 3.5–5.2)
Sodium: 140 mmol/L (ref 134–144)
Total Protein: 6.8 g/dL (ref 6.0–8.5)

## 2019-12-24 LAB — LIPID PANEL W/O CHOL/HDL RATIO
Cholesterol, Total: 171 mg/dL (ref 100–199)
HDL: 48 mg/dL (ref >39)
LDL Chol Calc (NIH): 103 mg/dL — ABNORMAL HIGH (ref 0–99)
Triglycerides: 109 mg/dL (ref 0–149)
VLDL Cholesterol Cal: 20 mg/dL (ref 5–40)

## 2019-12-26 ENCOUNTER — Other Ambulatory Visit: Payer: Self-pay | Admitting: Family Medicine

## 2019-12-26 LAB — UA/M W/RFLX CULTURE, ROUTINE
Bilirubin, UA: NEGATIVE
Glucose, UA: NEGATIVE
Ketones, UA: NEGATIVE
Nitrite, UA: NEGATIVE
Protein,UA: NEGATIVE
RBC, UA: NEGATIVE
Specific Gravity, UA: <1.005 — ABNORMAL LOW (ref 1.005–1.030)
Urobilinogen, Ur: 0.2 mg/dL (ref 0.2–1.0)
pH, UA: 5.5 (ref 5.0–7.5)

## 2019-12-26 LAB — MICROSCOPIC EXAMINATION: RBC, Urine: NONE SEEN /hpf (ref 0–2)

## 2019-12-26 LAB — URINE CULTURE, REFLEX

## 2019-12-26 MED ORDER — AMOXICILLIN-POT CLAVULANATE 875-125 MG PO TABS: 1.0000 | ORAL_TABLET | Freq: Two times a day (BID) | ORAL | 0 refills | Status: DC

## 2019-12-28 ENCOUNTER — Encounter: Payer: Self-pay | Admitting: Family Medicine

## 2019-12-28 NOTE — Assessment & Plan Note (Signed)
Under good control on current regimen. Continue current regimen. Continue to monitor. Call with any concerns. Refills given. Labs drawn today.   

## 2019-12-28 NOTE — Assessment & Plan Note (Signed)
Stable at this time. Call with any concerns. Continue to monitor.

## 2019-12-28 NOTE — Assessment & Plan Note (Signed)
Will keep BP, cholesterol and sugar under good control. Continue to monitor. Call with any concerns.  

## 2019-12-28 NOTE — Assessment & Plan Note (Addendum)
Under good control on current regimen with A1c of 6.2. Continue current regimen. Continue to monitor. Call with any concerns. Refills given. Labs drawn today. Having diarrhea on his short acting metformin. Will change to long acting and recheck 1 month.

## 2020-01-01 IMAGING — US ULTRASOUND SCROTUM DOPPLER COMPLETE
1 series · 13 of 25 positions shown · non-contrast
Comparison: None.

CLINICAL DATA: Benign neoplasm of the epididymis.

EXAM:
SCROTAL ULTRASOUND
DOPPLER ULTRASOUND OF THE TESTICLES
TECHNIQUE: Complete ultrasound examination of the testicles, epididymis, and
other scrotal structures was performed. Color and spectral Doppler
ultrasound were also utilized to evaluate blood flow to the
testicles.

[Series 1: ultrasound scrotum doppler complete · 0.08mm/px · 13 of 58 slices shown]
[im 1/58]
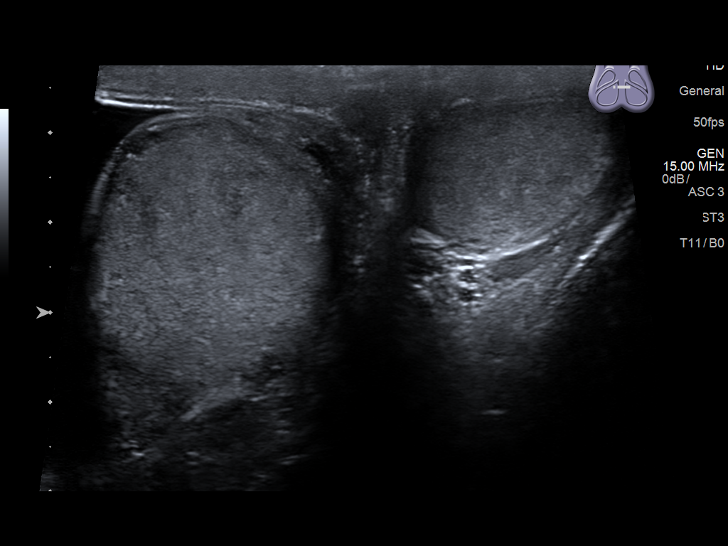
[im 5/58]
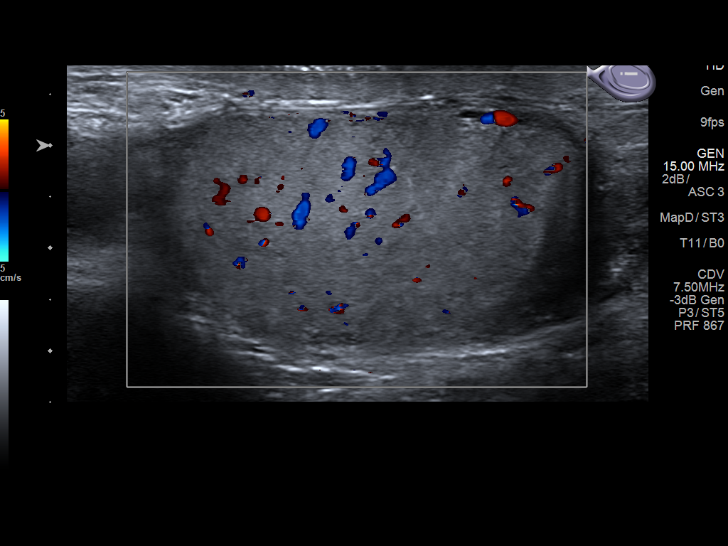
[im 10/58]
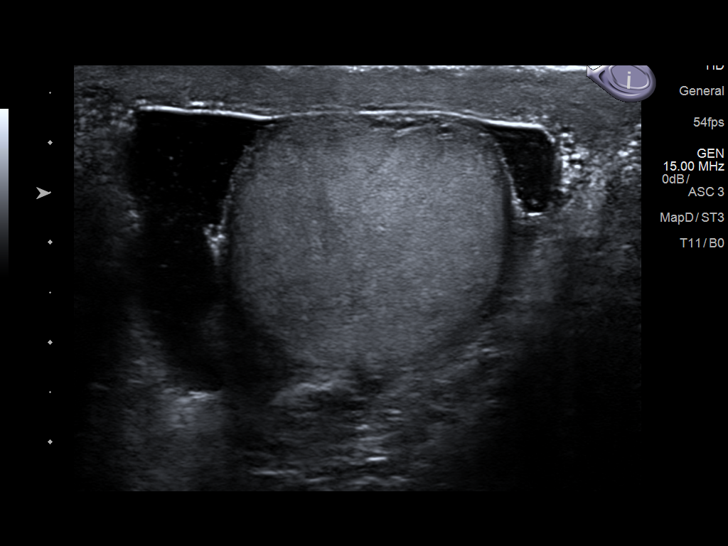
[im 15/58]
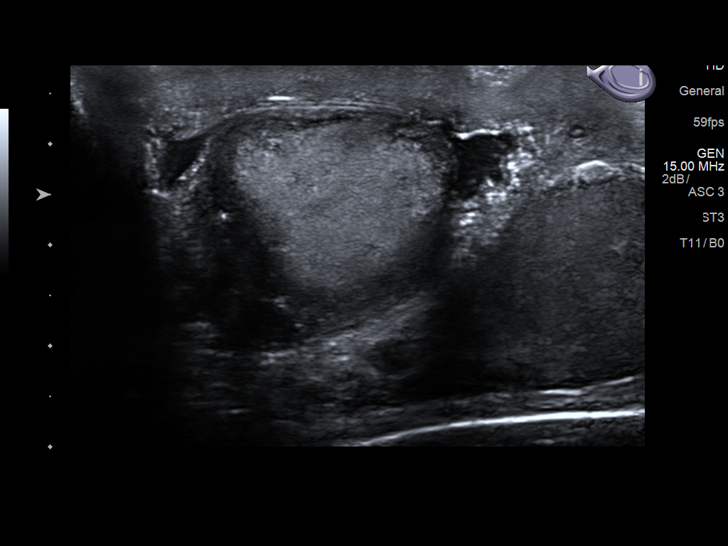
[im 20/58]
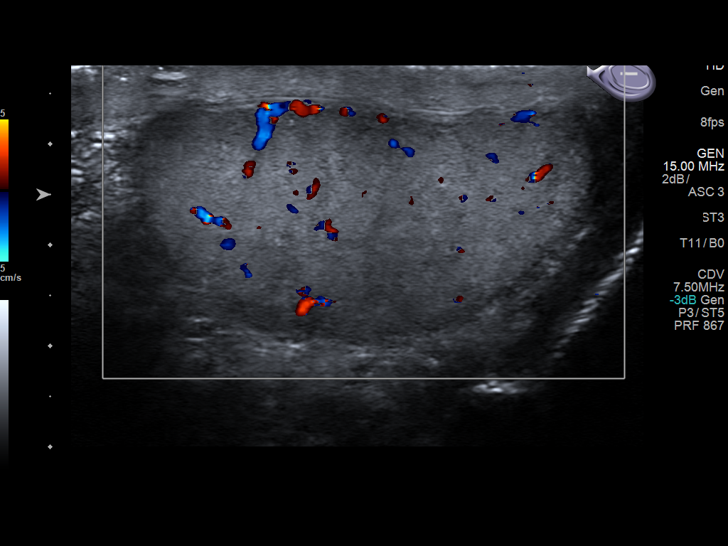
[im 24/58]
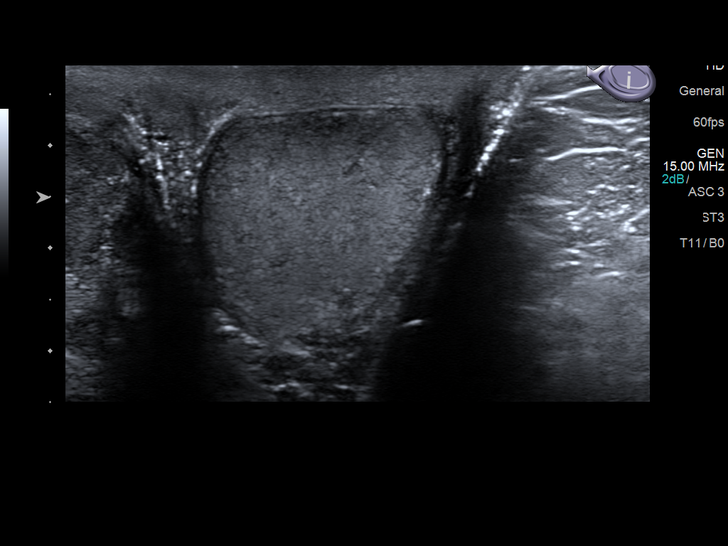
[im 29/58]
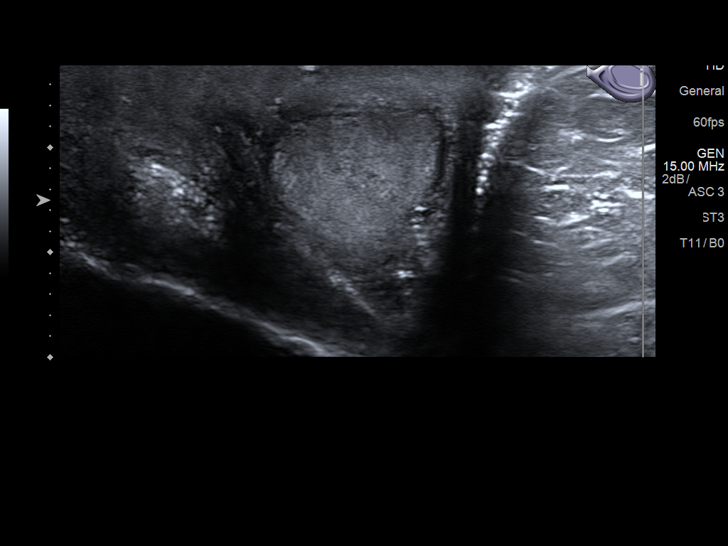
[im 34/58]
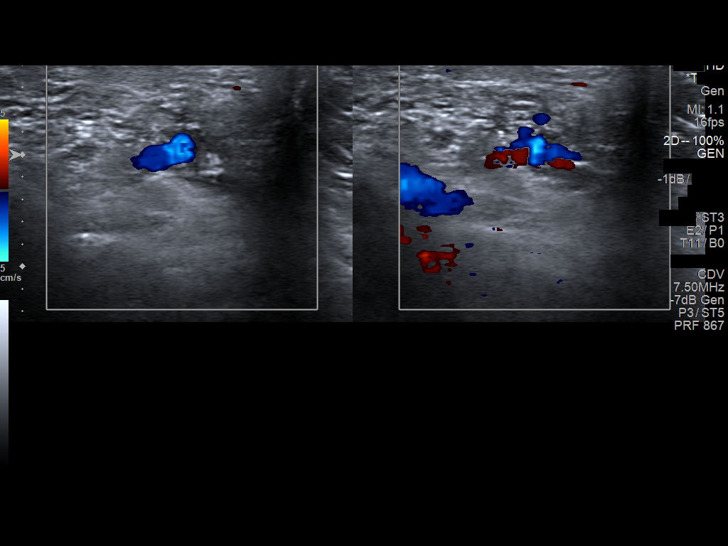
[im 39/58]
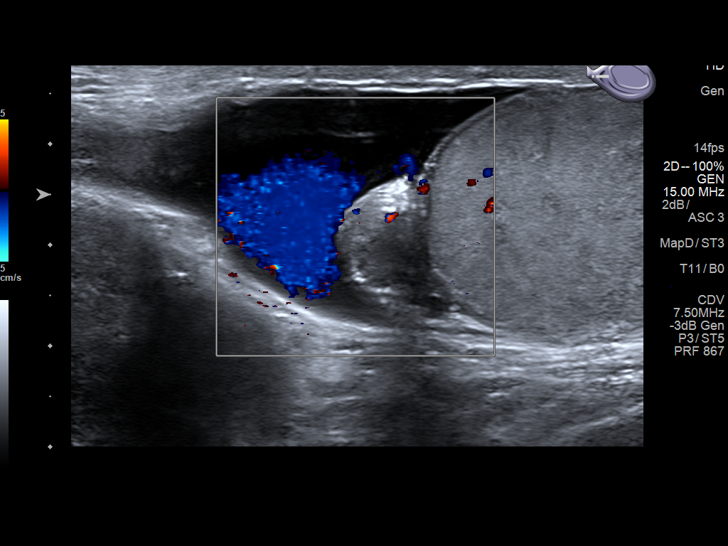
[im 43/58]
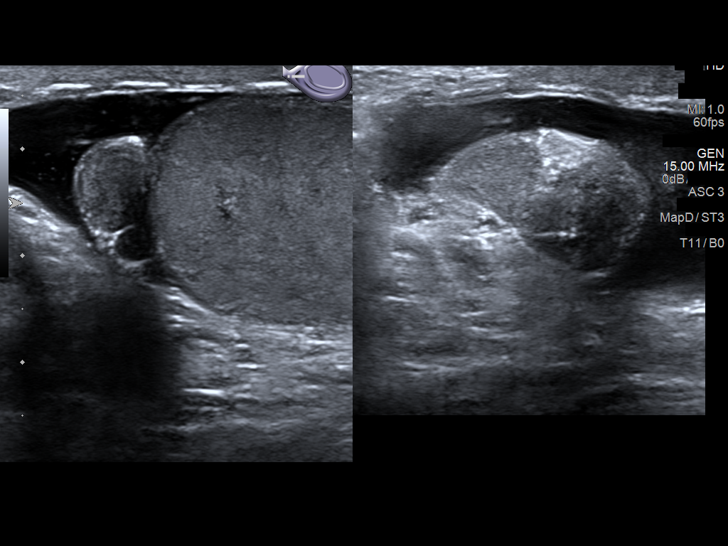
[im 48/58]
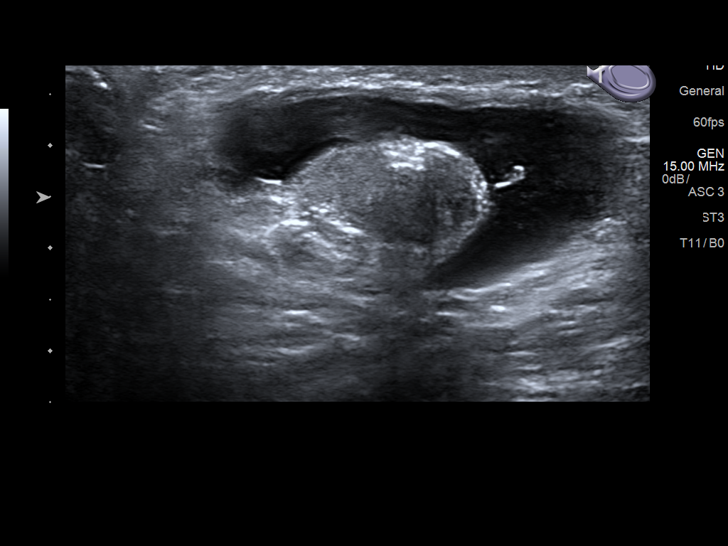
[im 53/58]
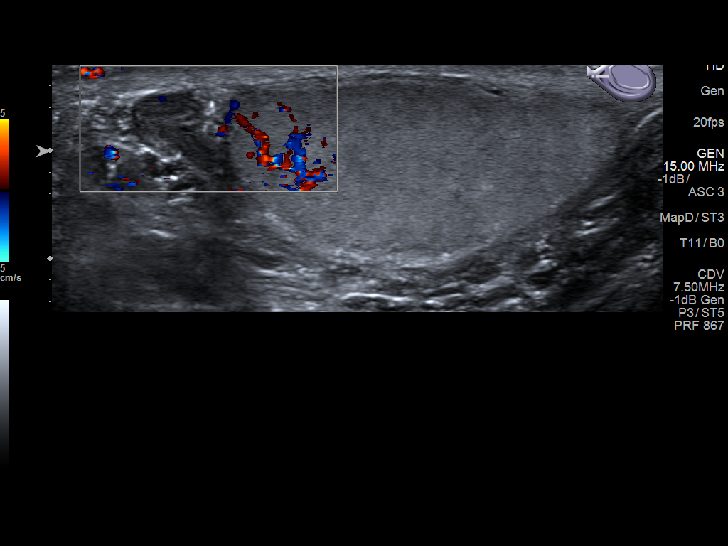
[im 58/58]
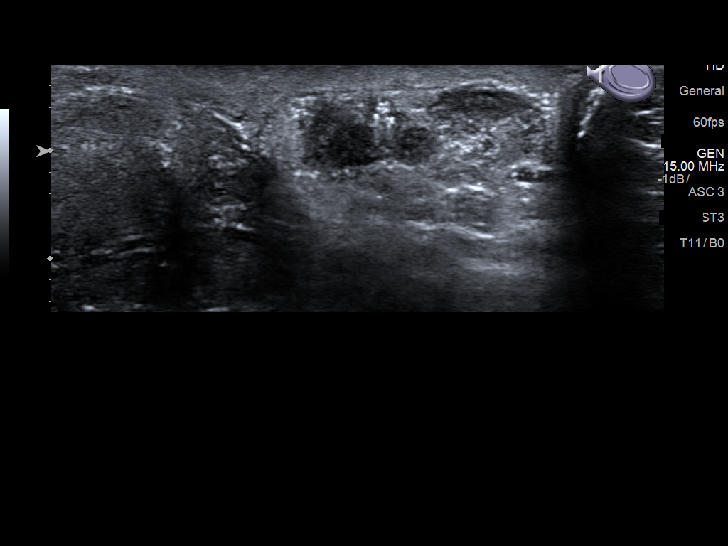

[13 of 25 positions shown; findings below may reference images not displayed]

FINDINGS: Right testicle

Measurements: 4.1 x 2.2 x 2.8 cm, within normal limits. No mass or
microlithiasis visualized.

Left testicle

Measurements: 4.5 x 2.4 x 2.3 cm, within normal limits. No mass or
microlithiasis visualized.

Right epididymis: A benign-appearing epididymal cyst measures 4 mm.
No significant solid lesion is present.

Left epididymis:  Normal in size and appearance.

Hydrocele: There is some fluid about the right testicle. This
appears benign

Varicocele:  None visualized.

Pulsed Doppler interrogation of both testes demonstrates normal low
resistance arterial and venous waveforms bilaterally.
IMPRESSION: 1. Benign-appearing epididymal cyst on the right measures 4 mm.
2. No significant epididymal mass.
3. Benign-appearing right hydrocele.
4. Normal appearance of the testicles.
5. Normal arterial and venous waveforms within both testicles.

## 2020-01-13 DIAGNOSIS — R350 Frequency of micturition: Secondary | ICD-10-CM | POA: Diagnosis not present

## 2020-01-13 DIAGNOSIS — R3914 Feeling of incomplete bladder emptying: Secondary | ICD-10-CM | POA: Diagnosis not present

## 2020-01-14 DIAGNOSIS — R350 Frequency of micturition: Secondary | ICD-10-CM | POA: Diagnosis not present

## 2020-01-14 DIAGNOSIS — R3914 Feeling of incomplete bladder emptying: Secondary | ICD-10-CM | POA: Diagnosis not present

## 2020-01-16 DIAGNOSIS — H40003 Preglaucoma, unspecified, bilateral: Secondary | ICD-10-CM | POA: Diagnosis not present

## 2020-01-21 DIAGNOSIS — R350 Frequency of micturition: Secondary | ICD-10-CM | POA: Diagnosis not present

## 2020-01-21 DIAGNOSIS — R3914 Feeling of incomplete bladder emptying: Secondary | ICD-10-CM | POA: Diagnosis not present

## 2020-01-22 DIAGNOSIS — R3914 Feeling of incomplete bladder emptying: Secondary | ICD-10-CM | POA: Diagnosis not present

## 2020-01-22 DIAGNOSIS — R350 Frequency of micturition: Secondary | ICD-10-CM | POA: Diagnosis not present

## 2020-01-23 DIAGNOSIS — H40003 Preglaucoma, unspecified, bilateral: Secondary | ICD-10-CM | POA: Diagnosis not present

## 2020-01-23 LAB — HM DIABETES EYE EXAM

## 2020-01-27 ENCOUNTER — Encounter: Payer: Self-pay | Admitting: Nurse Practitioner

## 2020-01-27 ENCOUNTER — Ambulatory Visit (INDEPENDENT_AMBULATORY_CARE_PROVIDER_SITE_OTHER): Payer: Medicare Other | Admitting: Nurse Practitioner

## 2020-01-27 ENCOUNTER — Other Ambulatory Visit: Payer: Self-pay

## 2020-01-27 VITALS — BP 138/85 | HR 100 | Temp 98.2°F | Wt 167.0 lb

## 2020-01-27 DIAGNOSIS — N390 Urinary tract infection, site not specified: Secondary | ICD-10-CM | POA: Diagnosis not present

## 2020-01-27 NOTE — Assessment & Plan Note (Signed)
Ongoing, continues to have recurrent UTI that are susceptible to Augmentin. Continue collaboration with urology, recommend he ensure to make upcoming follow-up.  UA today noting trace KET, trace BLD, 3+ LEUKS, neg NITS, neg bacteria.  Will await C&S to determine treatment needs, as he recently self treated and reports improvement in symptoms at this time. Recommend continued focus on hygiene at home.  Follow-up as needed for worsening or ongoing symptoms.

## 2020-01-27 NOTE — Progress Notes (Signed)
BP 138/85 (BP Location: Left Arm, Cuff Size: Normal)   Pulse 100   Temp 98.2 F (36.8 C) (Oral)   Wt 167 lb (75.8 kg)   SpO2 97%   BMI 25.39 kg/m    Subjective:    Patient ID: Rick [name], male    DOB: 01-Jan-1941, 79 y.o.   MRN: [id number]  HPI: Rick [name] is a 79 y.o. male  Chief Complaint  Patient presents with  . Urinary Tract Infection    pt states he was having some burning last week when urinating. States he took a few Amoxicillin e had left over and feels the infection is gone but wants to be sure    URINARY SYMPTOMS Current symptoms started about 8 days ago.  Has recurrent UTI history and is being followed by urology, there is discussion of neurogenic bladder.  Returns to see them on the 26th.  Last treated 12/26/2019 with Augmentin, UTI's tend to be susceptible to Augmentin.  He reports he had some leftover Augmentin at home and feels that the infection is gone now, but wants to be sure.   States he bought up a bunch of cranberry tablets from pharmacy.  Feels that some of his UTI could be coming from his underwear due to occasional diarrhea, has been putting alcohol on underwear at times -- he thinks this is helping.   Dysuria: improving Urinary frequency: no Urgency: no Small volume voids: no Symptom severity: no Urinary incontinence: no Foul odor: no Hematuria: no Abdominal pain: no Back pain: no Suprapubic pain/pressure: no Flank pain: no Fever:  no Vomiting: no Relief with cranberry juice: no Relief with pyridium: no Status: better/worse/stable Previous urinary tract infection: yes Recurrent urinary tract infection: yes Sexual activity: No sexually active/monogomous/practicing safe sex History of sexually transmitted disease: no Penile discharge: no Treatments attempted: cranberry and increasing fluids   Relevant past medical, surgical, family and social history reviewed and updated as indicated. Interim medical history since our last visit  reviewed. Allergies and medications reviewed and updated.  Review of Systems  Constitutional: Negative for activity change, diaphoresis, fatigue and fever.  Respiratory: Negative for cough, chest tightness, shortness of breath and wheezing.   Cardiovascular: Negative for chest pain, palpitations and leg swelling.  Gastrointestinal: Negative.   Genitourinary: Negative for dysuria, frequency, hematuria and urgency.  Neurological: Negative.   Psychiatric/Behavioral: Negative.     Per HPI unless specifically indicated above     Objective:    BP 138/85 (BP Location: Left Arm, Cuff Size: Normal)   Pulse 100   Temp 98.2 F (36.8 C) (Oral)   Wt 167 lb (75.8 kg)   SpO2 97%   BMI 25.39 kg/m   Wt Readings from Last 3 Encounters:  01/27/20 167 lb (75.8 kg)  12/23/19 170 lb (77.1 kg)  09/29/19 175 lb (79.4 kg)    Physical Exam Vitals and nursing note reviewed.  Constitutional:      General: He is awake. He is not in acute distress.    Appearance: He is well-developed and well-groomed. He is not ill-appearing.  HENT:     Head: Normocephalic and atraumatic.     Right Ear: Hearing normal. No drainage.     Left Ear: Hearing normal. No drainage.  Eyes:     General: Lids are normal.        Right eye: No discharge.        Left eye: No discharge.     Conjunctiva/sclera: Conjunctivae normal.  Pupils: Pupils are equal, round, and reactive to light.  Neck:     Trachea: Trachea normal.  Cardiovascular:     Rate and Rhythm: Normal rate and regular rhythm.     Heart sounds: Normal heart sounds, S1 normal and S2 normal. No murmur heard.  No gallop.   Pulmonary:     Effort: Pulmonary effort is normal. No accessory muscle usage or respiratory distress.     Breath sounds: Normal breath sounds.  Abdominal:     General: Bowel sounds are normal. There is no distension.     Palpations: Abdomen is soft.     Tenderness: There is no abdominal tenderness. There is no right CVA tenderness or  left CVA tenderness.  Musculoskeletal:        General: Normal range of motion.     Cervical back: Normal range of motion and neck supple.     Right lower leg: No edema.     Left lower leg: No edema.  Skin:    General: Skin is warm and dry.     Capillary Refill: Capillary refill takes less than 2 seconds.  Neurological:     Mental Status: He is alert and oriented to person, place, and time.  Psychiatric:        Attention and Perception: Attention normal.        Mood and Affect: Mood normal.        Speech: Speech normal.        Behavior: Behavior normal. Behavior is cooperative.     Results for orders placed or performed in visit on 01/27/20  HM DIABETES EYE EXAM  Result Value Ref Range   HM Diabetic Eye Exam No Retinopathy No Retinopathy      Assessment & Plan:   Problem List Items Addressed This Visit      Genitourinary   Recurrent UTI - Primary    Ongoing, continues to have recurrent UTI that are susceptible to Augmentin. Continue collaboration with urology, recommend he ensure to make upcoming follow-up.  UA today noting trace KET, trace BLD, 3+ LEUKS, neg NITS, neg bacteria.  Will await C&S to determine treatment needs, as he recently self treated and reports improvement in symptoms at this time. Recommend continued focus on hygiene at home.  Follow-up as needed for worsening or ongoing symptoms.      Relevant Orders   UA/M w/rflx Culture, Routine       Follow up plan: Return if symptoms worsen or fail to improve.

## 2020-01-27 NOTE — Patient Instructions (Signed)
Urinary Tract Infection, Adult A urinary tract infection (UTI) is an infection of any part of the urinary tract. The urinary tract includes:  The kidneys.  The ureters.  The bladder.  The urethra. These organs make, store, and get rid of pee (urine) in the body. What are the causes? This is caused by germs (bacteria) in your genital area. These germs grow and cause swelling (inflammation) of your urinary tract. What increases the risk? You are more likely to develop this condition if:  You have a small, thin tube (catheter) to drain pee.  You cannot control when you pee or poop (incontinence).  You are male, and: ? You use these methods to prevent pregnancy:  A medicine that kills sperm (spermicide).  A device that blocks sperm (diaphragm). ? You have low levels of a male hormone (estrogen). ? You are pregnant.  You have genes that add to your risk.  You are sexually active.  You take antibiotic medicines.  You have trouble peeing because of: ? A prostate that is bigger than normal, if you are male. ? A blockage in the part of your body that drains pee from the bladder (urethra). ? A kidney stone. ? A nerve condition that affects your bladder (neurogenic bladder). ? Not getting enough to drink. ? Not peeing often enough.  You have other conditions, such as: ? Diabetes. ? A weak disease-fighting system (immune system). ? Sickle cell disease. ? Gout. ? Injury of the spine. What are the signs or symptoms? Symptoms of this condition include:  Needing to pee right away (urgently).  Peeing often.  Peeing small amounts often.  Pain or burning when peeing.  Blood in the pee.  Pee that smells bad or not like normal.  Trouble peeing.  Pee that is cloudy.  Fluid coming from the vagina, if you are male.  Pain in the belly or lower back. Other symptoms include:  Throwing up (vomiting).  No urge to eat.  Feeling mixed up (confused).  Being tired  and grouchy (irritable).  A fever.  Watery poop (diarrhea). How is this treated? This condition may be treated with:  Antibiotic medicine.  Other medicines.  Drinking enough water. Follow these instructions at home:  Medicines  Take over-the-counter and prescription medicines only as told by your doctor.  If you were prescribed an antibiotic medicine, take it as told by your doctor. Do not stop taking it even if you start to feel better. General instructions  Make sure you: ? Pee until your bladder is empty. ? Do not hold pee for a long time. ? Empty your bladder after sex. ? Wipe from front to back after pooping if you are a male. Use each tissue one time when you wipe.  Drink enough fluid to keep your pee pale yellow.  Keep all follow-up visits as told by your doctor. This is important. Contact a doctor if:  You do not get better after 1-2 days.  Your symptoms go away and then come back. Get help right away if:  You have very bad back pain.  You have very bad pain in your lower belly.  You have a fever.  You are sick to your stomach (nauseous).  You are throwing up. Summary  A urinary tract infection (UTI) is an infection of any part of the urinary tract.  This condition is caused by germs in your genital area.  There are many risk factors for a UTI. These include having a small, thin   tube to drain pee and not being able to control when you pee or poop.  Treatment includes antibiotic medicines for germs.  Drink enough fluid to keep your pee pale yellow. This information is not intended to replace advice given to you by your health care provider. Make sure you discuss any questions you have with your health care provider. Document Revised: 07/18/2018 Document Reviewed: 02/07/2018 Elsevier Patient Education  2020 [employer] Inc[employer]  

## 2020-01-28 ENCOUNTER — Ambulatory Visit: Payer: Medicare Other | Admitting: Nurse Practitioner

## 2020-01-29 LAB — UA/M W/RFLX CULTURE, ROUTINE
Bilirubin, UA: NEGATIVE
Glucose, UA: NEGATIVE
Nitrite, UA: NEGATIVE
Protein,UA: NEGATIVE
Specific Gravity, UA: 1.015 (ref 1.005–1.030)
Urobilinogen, Ur: 0.2 mg/dL (ref 0.2–1.0)
pH, UA: 6 (ref 5.0–7.5)

## 2020-01-29 LAB — URINE CULTURE, REFLEX

## 2020-01-29 LAB — MICROSCOPIC EXAMINATION: Bacteria, UA: NONE SEEN

## 2020-01-29 NOTE — Progress Notes (Signed)
Please Let Rick [name] know his urine returned showing no growth, no further need to treat at this time.  I do recommend he stick to his current plan and drink lots of water at home.  Have a great day!!

## 2020-02-02 ENCOUNTER — Telehealth: Payer: Self-pay | Admitting: Family Medicine

## 2020-02-02 DIAGNOSIS — I130 Hypertensive heart and chronic kidney disease with heart failure and stage 1 through stage 4 chronic kidney disease, or unspecified chronic kidney disease: Secondary | ICD-10-CM | POA: Diagnosis not present

## 2020-02-02 DIAGNOSIS — R3914 Feeling of incomplete bladder emptying: Secondary | ICD-10-CM | POA: Diagnosis not present

## 2020-02-02 NOTE — Telephone Encounter (Signed)
Perfect

## 2020-02-02 NOTE — Telephone Encounter (Signed)
He is not coming in Thursday, he is going to get his scan done first.

## 2020-02-02 NOTE — Telephone Encounter (Signed)
Can we please try to get the records from Dr. Rogers Blocker before he's seen on Thursday?

## 2020-02-02 NOTE — Telephone Encounter (Signed)
Called and spoke patient, he has to have a scan done to check his kidneys, he will reschedule his appt after he gets results.

## 2020-02-02 NOTE — Telephone Encounter (Signed)
Copied from [place] [id number]. Topic: General - Other >> Feb 02, 2020 12:30 PM Rainey Pines A wrote: Patient stated that he would like a callback from [place] in regards to his "kidney issue". Patient did not want to elaborate. Please advise

## 2020-02-03 ENCOUNTER — Other Ambulatory Visit: Payer: Self-pay | Admitting: Urology

## 2020-02-03 ENCOUNTER — Other Ambulatory Visit (HOSPITAL_COMMUNITY): Payer: Self-pay | Admitting: Urology

## 2020-02-03 DIAGNOSIS — Z85528 Personal history of other malignant neoplasm of kidney: Secondary | ICD-10-CM

## 2020-02-05 ENCOUNTER — Ambulatory Visit: Payer: Medicare Other | Admitting: Family Medicine

## 2020-02-10 ENCOUNTER — Ambulatory Visit
Admission: RE | Admit: 2020-02-10 | Discharge: 2020-02-10 | Disposition: A | Discharge status: Home / Self Care | Payer: Medicare Other | Source: Ambulatory Visit | Attending: Urology | Admitting: Urology

## 2020-02-10 ENCOUNTER — Other Ambulatory Visit: Payer: Self-pay

## 2020-02-10 DIAGNOSIS — Z85528 Personal history of other malignant neoplasm of kidney: Secondary | ICD-10-CM | POA: Diagnosis not present

## 2020-02-10 DIAGNOSIS — K439 Ventral hernia without obstruction or gangrene: Secondary | ICD-10-CM | POA: Diagnosis not present

## 2020-02-10 DIAGNOSIS — N3289 Other specified disorders of bladder: Secondary | ICD-10-CM | POA: Diagnosis not present

## 2020-02-10 DIAGNOSIS — I700 Atherosclerosis of aorta: Secondary | ICD-10-CM | POA: Diagnosis not present

## 2020-02-10 DIAGNOSIS — K469 Unspecified abdominal hernia without obstruction or gangrene: Secondary | ICD-10-CM | POA: Diagnosis not present

## 2020-02-10 MED ORDER — IOHEXOL 300 MG/ML  SOLN
100.0000 mL | Freq: Once | INTRAMUSCULAR | Status: AC | PRN
  Administered 2020-02-10: 100 mL via INTRAVENOUS

## 2020-02-18 DIAGNOSIS — Z8559 Personal history of malignant neoplasm of other urinary tract organ: Secondary | ICD-10-CM | POA: Diagnosis not present

## 2020-03-08 DIAGNOSIS — R3914 Feeling of incomplete bladder emptying: Secondary | ICD-10-CM | POA: Diagnosis not present

## 2020-03-12 ENCOUNTER — Ambulatory Visit: Payer: Self-pay

## 2020-03-12 NOTE — Telephone Encounter (Signed)
Pt. Asking for appointment for second COVID 19 vaccine. Transferred to [place] agent.

## 2020-03-15 ENCOUNTER — Ambulatory Visit: Payer: Self-pay

## 2020-03-31 ENCOUNTER — Ambulatory Visit: Payer: Self-pay | Admitting: *Deleted

## 2020-03-31 NOTE — Telephone Encounter (Signed)
Patient is concerned that he has developed a fever with headache.Patient wants appointment- in office- he refuses to go to UC.Patient states his son was sick 2-3 days ago- he got better and did not test for COVID. Patient states he only had the first COVID vaccine and did not get the second. He is concerned about his symptoms. Call to office- no appointment available today-did advise UC.  He is going to stay home and call in the morning for appointment. Patient advised if anything changes to get seen immediately- he is aware.  Patient request a call to schedule for tomorrow.  Reason for Disposition . [1] Fever > 101 F (38.3 C) AND [2] age > 60 years  Answer Assessment - Initial Assessment Questions 1. TEMPERATURE: "What is the most recent temperature?"  "How was it measured?"      101 2. ONSET: "When did the fever start?"      Warm today- 2-3 hours ago 3. SYMPTOMS: "Do you have any other symptoms besides the fever?"  (e.g., colds, headache, sore throat, earache, cough, rash, diarrhea, vomiting, abdominal pain)     headache 4. CAUSE: If there are no symptoms, ask: "What do you think is causing the fever?"      unknown 5. CONTACTS: "Does anyone else in the family have an infection?"     Son felt sick-2-3 days ago 6. TREATMENT: "What have you done so far to treat this fever?" (e.g., medications)     Tylenol- 2-3 hours ago 7. IMMUNOCOMPROMISE: "Do you have of the following: diabetes, HIV positive, splenectomy, cancer chemotherapy, chronic steroid treatment, transplant patient, etc."     Age, diabetes 8. PREGNANCY: "Is there any chance you are pregnant?" "When was your last menstrual period?"     n/a 9. TRAVEL: "Have you traveled out of the country in the last month?" (e.g., travel history, exposures)     no  Protocols used: FEVER-A-AH

## 2020-03-31 NOTE — Telephone Encounter (Signed)
Called pt to schedule virtual tomorrow, he refused gave information to [facility] in [place] per [place] Pt states that he will go over there in the morning.

## 2020-04-01 NOTE — Telephone Encounter (Signed)
Noted  

## 2020-05-17 ENCOUNTER — Ambulatory Visit: Payer: Self-pay | Admitting: *Deleted

## 2020-05-17 NOTE — Telephone Encounter (Signed)
Patient reports he has has pain at past hernia repair site for 1 week. He states he has pain with palpation and movement not lump or bulging reported. Patient states the pain can be severe at times. Patient declines UC- he states he wants appointment in the office- told patient I would send request to see if his appointment can be moved up.  Reason for Disposition  [1] MILD-MODERATE pain AND [2] constant AND [3] present > 2 hours  Answer Assessment - Initial Assessment Questions 1. LOCATION: "Where does it hurt?"      golfball size sore area- hernia surgery past- [facility] 2. RADIATION: "Does the pain shoot anywhere else?" (e.g., chest, back)     No traveling 3. ONSET: "When did the pain begin?" (Minutes, hours or days ago)      1 week ago 4. SUDDEN: "Gradual or sudden onset?"     gradual 5. PATTERN "Does the pain come and go, or is it constant?"    - If constant: "Is it getting better, staying the same, or worsening?"      (Note: Constant means the pain never goes away completely; most serious pain is constant and it progresses)     - If intermittent: "How long does it last?" "Do you have pain now?"     (Note: Intermittent means the pain goes away completely between bouts)     Constant-if moves or presses- worsening 6. SEVERITY: "How bad is the pain?"  (e.g., Scale 1-10; mild, moderate, or severe)    - MILD (1-3): doesn't interfere with normal activities, abdomen soft and not tender to touch     - MODERATE (4-7): interferes with normal activities or awakens from sleep, tender to touch     - SEVERE (8-10): excruciating pain, doubled over, unable to do any normal activities       Mild/moderate 7. RECURRENT SYMPTOM: "Have you ever had this type of stomach pain before?" If Yes, ask: "When was the last time?" and "What happened that time?"      Yes- history of hernia 8. CAUSE: "What do you think is causing the stomach pain?"     Possible hernia complication 9. RELIEVING/AGGRAVATING FACTORS: "What  makes it better or worse?" (e.g., movement, antacids, bowel movement)     Helps- don't mess with- palpation hurt 10. OTHER SYMPTOMS: "Has there been any vomiting, diarrhea, constipation, or urine problems?"       no  Protocols used: ABDOMINAL PAIN - MALE-A-AH

## 2020-05-18 ENCOUNTER — Other Ambulatory Visit: Payer: Self-pay | Admitting: Family Medicine

## 2020-05-24 ENCOUNTER — Telehealth: Payer: Self-pay

## 2020-05-24 NOTE — Telephone Encounter (Signed)
Copied from [place] [id number]. Topic: General - Other >> May 24, 2020  3:38 PM [name] wrote: PT has some questions about A1C / he has an appt coming uo on Thursday 10/14 / please advise

## 2020-05-24 NOTE — Telephone Encounter (Signed)
Copied from [place] [id number]. Topic: General - Other >> May 24, 2020  3:38 PM [name] wrote: PT has some questions about A1C / he has an appt coming uo on Thursday 10/14 / please advise

## 2020-05-25 NOTE — Telephone Encounter (Signed)
Tried returning patient's call, no answer and got a busy tone. Will try to call again later.

## 2020-05-26 NOTE — Progress Notes (Deleted)
There were no vitals taken for this visit.   Subjective:    Patient ID: Rick [name], male    DOB: [birth year], 79 y.o.   MRN: [id number]  HPI: Rick [name] is a 79 y.o. male  No chief complaint on file.  DIABETES Hypoglycemic episodes:{Blank single:19197::"yes","no"} Polydipsia/polyuria: {Blank single:19197::"yes","no"} Visual disturbance: {Blank single:19197::"yes","no"} Chest pain: {Blank single:19197::"yes","no"} Paresthesias: {Blank single:19197::"yes","no"} Glucose Monitoring: {Blank single:19197::"yes","no"}  Accucheck frequency:  Fasting glucose:  Post prandial:  Evening:  Before meals: Taking Insulin?: {Blank single:19197::"yes","no"}  Long acting insulin:  Short acting insulin: Blood Pressure Monitoring: {Blank single:19197::"not checking","rarely","daily","weekly","monthly","a few times a day","a few times a week","a few times a month"} Normal BP: Retinal Examination: {Blank single:19197::"Up to Date","Not up to Date"} Foot Exam: {Blank single:19197::"Up to Date","Not up to Date"} Diabetic Education: {Blank single:19197::"Completed","Not Completed"} Pneumovax: {Blank single:19197::"Up to Date","Not up to Date","unknown"} Influenza: {Blank single:19197::"Up to Date","Not up to Date","unknown"} Aspirin: {Blank single:19197::"yes","no"}  EAG CLOGGED Duration: {Blank single:19197::"days","weeks","months"} Involved ear(s): {Blank single:19197::""right","left","bilateral"} Sensation of feeling clogged/plugged: {Blank single:19197::"yes","no"} Decreased/muffled hearing:{Blank single:19197::"yes","no"} Ear pain: {Blank single:19197::"yes","no"} Fever: {Blank single:19197::"yes","no"} Otorrhea: {Blank single:19197::"yes","no"} Hearing loss: {Blank single:19197::"yes","no"} Upper respiratory infection symptoms: {Blank single:19197::"yes","no"} Using Q-Tips: {Blank single:19197::"yes","no"} Status: {Blank  multiple:19196::"better","worse","stable","fluctuating"} History of cerumenosis: {Blank single:19197::"yes","no"} Treatments attempted: {Blank single:19197::"none","pseudoephedrine"}  SKIN LESION Duration: {Blank single:19197::"days","weeks","months"} Location:  Painful: {Blank single:19197::"yes","no"} Itching: {Blank single:19197::"yes","no"} Onset: {Blank single:19197::"sudden","gradual"} Context: {Blank single:19197::"bigger","smaller","changing","not changing"} Associated signs and symptoms:  History of skin cancer: {Blank single:19197::"yes","no"} History of precancerous skin lesions: {Blank single:19197::"yes","no"} Family history of skin cancer: {Blank single:19197::"yes","no"}  Allergies  Allergen Reactions  . Blood-Group Specific Substance   . Haloperidol Other (See Comments)    Unknown  . Sulfa Antibiotics Other (See Comments)    Unknown   Outpatient Encounter Medications as of 05/27/2020  Medication Sig  . Blood Glucose Monitoring Suppl (ONE TOUCH ULTRA SYSTEM KIT) w/Device KIT 1 kit by Does not apply route once.  . carbidopa-levodopa (SINEMET IR) 25-100 MG tablet Take 1 tablet by mouth 4 (four) times daily.   . carvedilol (COREG) 6.25 MG tablet Take 1 tablet (6.25 mg total) by mouth 2 (two) times daily with a meal.  . diclofenac sodium (VOLTAREN) 1 % GEL Apply 2 g topically 4 (four) times daily. (Patient taking differently: Apply 2 g topically 4 (four) times daily as needed (lower back pain.). )  . enalapril (VASOTEC) 10 MG tablet Take 1 tablet (10 mg total) by mouth daily.  . fluticasone (FLONASE) 50 MCG/ACT nasal spray Place 2 sprays into both nostrils 2 (two) times daily.  [name] gabapentin (NEURONTIN) 300 MG capsule Take 2 capsules (600 mg total) by mouth 2 (two) times daily.  . hydrochlorothiazide (HYDRODIURIL) 25 MG tablet TAKE ONE (1) TABLET EACH DAY  . Insulin Glargine (BASAGLAR KWIKPEN) 100 UNIT/ML Inject 0.15 mLs (15 Units total) into the skin daily.  . Insulin  Pen Needle (ULTICARE PEN NEEDLES) 29G X 12MM MISC USE AS DIRECTED  . Lancets (ONETOUCH ULTRASOFT) lancets TEST BLOOD SUGAR THREE TIMES DAILY  . lovastatin (MEVACOR) 40 MG tablet Take 1 tablet (40 mg total) by mouth daily.  . Menthol, Topical Analgesic, (BENGAY EX) Apply 1 application topically 3 (three) times daily as needed (lower back pain.).  [name] metFORMIN (GLUCOPHAGE XR) 500 MG 24 hr tablet Take 2 tablets (1,000 mg total) by mouth in the morning and at bedtime.  . metFORMIN (GLUCOPHAGE) 1000 MG tablet TAKE 1 TABLET BY MOUTH TWICE A DAY WITH A MEAL  . mirtazapine (REMERON) 15 MG tablet Take 1 tablet (15 mg  total) by mouth at bedtime.  Glory Rosebush ULTRA test strip TEST BLOOD SUGAR THREE TIMES DAILY.  . silodosin (RAPAFLO) 8 MG CAPS capsule Take 1 capsule (8 mg total) by mouth daily with breakfast.  . traZODone (DESYREL) 100 MG tablet Take 1 tablet (100 mg total) by mouth at bedtime.  . valACYclovir (VALTREX) 1000 MG tablet Take 1 tablet (1,000 mg total) by mouth daily as needed.   No facility-administered encounter medications on file as of 05/27/2020.   Patient Active Problem List   Diagnosis Date Noted  . Tremor 05/28/2018  . BPH (benign prostatic hyperplasia) 03/22/2018  . Recurrent UTI 11/29/2017  . Osteoarthritis of multiple joints 08/23/2017  . PVD (peripheral vascular disease) ([facility]) 05/14/2017  . Hard of hearing 12/07/2016  . History of tobacco abuse 12/07/2016  . History of ulcerative colitis 12/07/2016  . Ventral hernia 12/21/2015  . History of colon cancer, no staging 12/21/2015  . History of bladder cancer 08/05/2015  . Benign fibroma of prostate 08/05/2015  . History of alcoholism ([place] [facility]) 08/05/2015  . History of migraine headaches 08/05/2015  . Calculus of kidney 08/05/2015  . Hypertension 05/11/2015  . Hyperlipidemia 05/11/2015  . Affective bipolar disorder ([facility]) 03/24/2015  . Diabetic peripheral neuropathy associated with type 2 diabetes mellitus ([place]) 03/24/2015  .  Insomnia 03/24/2015  . History of abdominal hernia 02/10/2015  . Anxiety and depression 02/10/2015  . Generalized anxiety disorder 05/18/2014  . Chronic prostatitis 06/12/2013  . Herpesviral infection of penis 11/07/2012  . Incomplete bladder emptying 11/07/2012  . Incisional hernia 11/07/2012  . Balanoposthitis 10/31/2012  . Benign localized prostatic hyperplasia with lower urinary tract symptoms (LUTS) 10/31/2012  . ED (erectile dysfunction) of organic origin 10/31/2012  . 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 Anxiety   . Bipolar disorder ([place])   . Bladder cancer ([place])   . BPH (benign prostatic hypertrophy)   . Chest pain, atypical 06/23/2015  . Closed trimalleolar fracture of left ankle 10/01/2015  . Cystitis    hx of   . Depression   . Diabetes mellitus without complication ([facility])    type 2   . Difficult or painful urination 10/31/2012  . Dysrhythmia   . Ear problem 03/24/2015  . Gangrenous appendicitis   . H/O urinary disorder 03/27/2013  . Herpes genitalis in men   . Hyperlipidemia   . Hypertension   . Insomnia   . Kidney stones   . Left hand pain 05/18/2015  . Mass of arm 02/10/2015  . Peripheral neuropathy   . Skin cancer   . UD (urethral discharge) 10/31/2012  . Urinary tract infection    hx of    Relevant past medical, surgical, family and social history reviewed and updated as indicated. Interim medical history since our last visit reviewed.  Review of Systems  Per HPI unless specifically indicated above     Objective:    There were no vitals taken for this visit.  Wt Readings from Last 3 Encounters:  01/27/20 167 lb (75.8 kg)  12/23/19 170 lb (77.1 kg)  09/29/19 175 lb (79.4 kg)    Physical Exam  Results for orders placed or performed in visit on 01/27/20  Microscopic Examination   Urine  Result Value Ref Range   WBC, UA 11-30 (A) 0 - 5 /hpf   RBC 0-2 0 - 2 /hpf   Epithelial Cells (non renal) 0-10 0 - 10  /hpf   Bacteria, UA None  seen None seen/Few  Urine Culture, Reflex   Urine  Result Value Ref Range   Urine Culture, Routine Final report    Organism ID, Bacteria Comment   UA/M w/rflx Culture, Routine   Specimen: Urine   Urine  Result Value Ref Range   Specific Gravity, UA 1.015 1.005 - 1.030   pH, UA 6.0 5.0 - 7.5   Color, UA Yellow Yellow   Appearance Ur Clear Clear   Leukocytes,UA 3+ (A) Negative   Protein,UA Negative Negative/Trace   Glucose, UA Negative Negative   Ketones, UA Trace (A) Negative   RBC, UA Trace (A) Negative   Bilirubin, UA Negative Negative   Urobilinogen, Ur 0.2 0.2 - 1.0 mg/dL   Nitrite, UA Negative Negative   Microscopic Examination See below:    Urinalysis Reflex Comment       Assessment & Plan:   Problem List Items Addressed This Visit    None       Follow up plan: No follow-ups on file.

## 2020-05-26 NOTE — Telephone Encounter (Signed)
Called and spoke to patient. He just wants to have his A1C checked and to have ears cleaned at appointment tomorrow morning.

## 2020-05-27 ENCOUNTER — Encounter: Payer: Self-pay | Admitting: Nurse Practitioner

## 2020-05-27 ENCOUNTER — Other Ambulatory Visit: Payer: Self-pay

## 2020-05-27 ENCOUNTER — Ambulatory Visit: Payer: Medicare Other | Admitting: Nurse Practitioner

## 2020-05-27 ENCOUNTER — Ambulatory Visit (INDEPENDENT_AMBULATORY_CARE_PROVIDER_SITE_OTHER): Payer: Medicare Other | Admitting: Nurse Practitioner

## 2020-05-27 VITALS — BP 122/58 | HR 101 | Temp 98.7°F | Wt 167.2 lb

## 2020-05-27 DIAGNOSIS — E785 Hyperlipidemia, unspecified: Secondary | ICD-10-CM

## 2020-05-27 DIAGNOSIS — I10 Essential (primary) hypertension: Secondary | ICD-10-CM

## 2020-05-27 DIAGNOSIS — G4700 Insomnia, unspecified: Secondary | ICD-10-CM

## 2020-05-27 DIAGNOSIS — E1142 Type 2 diabetes mellitus with diabetic polyneuropathy: Secondary | ICD-10-CM

## 2020-05-27 DIAGNOSIS — E782 Mixed hyperlipidemia: Secondary | ICD-10-CM

## 2020-05-27 DIAGNOSIS — H9193 Unspecified hearing loss, bilateral: Secondary | ICD-10-CM

## 2020-05-27 LAB — BAYER DCA HB A1C WAIVED: HB A1C (BAYER DCA - WAIVED): 5.4 % (ref <7.0)

## 2020-05-27 MED ORDER — GABAPENTIN 300 MG PO CAPS: 600.0000 mg | ORAL_CAPSULE | Freq: Two times a day (BID) | ORAL | 1 refills | Status: DC

## 2020-05-27 MED ORDER — LOVASTATIN 40 MG PO TABS: 40.0000 mg | ORAL_TABLET | Freq: Every day | ORAL | 0 refills | Status: DC

## 2020-05-27 MED ORDER — TRAZODONE HCL 100 MG PO TABS: 100.0000 mg | ORAL_TABLET | Freq: Every day | ORAL | 0 refills | Status: DC

## 2020-05-27 MED ORDER — BASAGLAR KWIKPEN 100 UNIT/ML ~~LOC~~ SOPN: 15.0000 [IU] | PEN_INJECTOR | Freq: Every day | SUBCUTANEOUS | 3 refills | Status: DC

## 2020-05-27 MED ORDER — HYDROCHLOROTHIAZIDE 25 MG PO TABS: ORAL_TABLET | ORAL | 0 refills | Status: DC

## 2020-05-27 MED ORDER — ENALAPRIL MALEATE 10 MG PO TABS: 10.0000 mg | ORAL_TABLET | Freq: Every day | ORAL | 0 refills | Status: DC

## 2020-05-27 MED ORDER — CARVEDILOL 6.25 MG PO TABS: 6.2500 mg | ORAL_TABLET | Freq: Two times a day (BID) | ORAL | 0 refills | Status: DC

## 2020-05-27 MED ORDER — METFORMIN HCL ER 500 MG PO TB24: 1000.0000 mg | ORAL_TABLET | Freq: Two times a day (BID) | ORAL | 0 refills | Status: DC

## 2020-05-27 MED ORDER — MIRTAZAPINE 15 MG PO TABS: 15.0000 mg | ORAL_TABLET | Freq: Every day | ORAL | 0 refills | Status: DC

## 2020-05-27 MED ORDER — SILODOSIN 8 MG PO CAPS: 8.0000 mg | ORAL_CAPSULE | Freq: Every day | ORAL | 0 refills | Status: DC

## 2020-05-27 NOTE — Patient Instructions (Signed)
Carbohydrate Counting for Diabetes Mellitus, Adult  Carbohydrate counting is a method of keeping track of how many carbohydrates you eat. Eating carbohydrates naturally increases the amount of sugar (glucose) in the blood. Counting how many carbohydrates you eat helps keep your blood glucose within normal limits, which helps you manage your diabetes (diabetes mellitus). It is important to know how many carbohydrates you can safely have in each meal. This is different for every person. A diet and nutrition specialist (registered dietitian) can help you make a meal plan and calculate how many carbohydrates you should have at each meal and snack. Carbohydrates are found in the following foods:  Grains, such as breads and cereals.  Dried beans and soy products.  Starchy vegetables, such as potatoes, peas, and corn.  Fruit and fruit juices.  Milk and yogurt.  Sweets and snack foods, such as cake, cookies, candy, chips, and soft drinks. How do I count carbohydrates? There are two ways to count carbohydrates in food. You can use either of the methods or a combination of both. Reading "Nutrition Facts" on packaged food The "Nutrition Facts" list is included on the labels of almost all packaged foods and beverages in the [place] It includes:  The serving size.  Information about nutrients in each serving, including the grams (g) of carbohydrate per serving. To use the "Nutrition Facts":  Decide how many servings you will have.  Multiply the number of servings by the number of carbohydrates per serving.  The resulting number is the total amount of carbohydrates that you will be having. Learning standard serving sizes of other foods When you eat carbohydrate foods that are not packaged or do not include "Nutrition Facts" on the label, you need to measure the servings in order to count the amount of carbohydrates:  Measure the foods that you will eat with a food scale or measuring cup, if  needed.  Decide how many standard-size servings you will eat.  Multiply the number of servings by 15. Most carbohydrate-rich foods have about 15 g of carbohydrates per serving. ? For example, if you eat 8 oz (170 g) of strawberries, you will have eaten 2 servings and 30 g of carbohydrates (2 servings x 15 g = 30 g).  For foods that have more than one food mixed, such as soups and casseroles, you must count the carbohydrates in each food that is included. The following list contains standard serving sizes of common carbohydrate-rich foods. Each of these servings has about 15 g of carbohydrates:   hamburger bun or  English muffin.   oz (15 mL) syrup.   oz (14 g) jelly.  1 slice of bread.  1 six-inch tortilla.  3 oz (85 g) cooked rice or pasta.  4 oz (113 g) cooked dried beans.  4 oz (113 g) starchy vegetable, such as peas, corn, or potatoes.  4 oz (113 g) hot cereal.  4 oz (113 g) mashed potatoes or  of a large baked potato.  4 oz (113 g) canned or frozen fruit.  4 oz (120 mL) fruit juice.  4-6 crackers.  6 chicken nuggets.  6 oz (170 g) unsweetened dry cereal.  6 oz (170 g) plain fat-free yogurt or yogurt sweetened with artificial sweeteners.  8 oz (240 mL) milk.  8 oz (170 g) fresh fruit or one small piece of fruit.  24 oz (680 g) popped popcorn. Example of carbohydrate counting Sample meal  3 oz (85 g) chicken breast.  6 oz (170 g)   brown rice.  4 oz (113 g) corn.  8 oz (240 mL) milk.  8 oz (170 g) strawberries with sugar-free whipped topping. Carbohydrate calculation 1. Identify the foods that contain carbohydrates: ? Rice. ? Corn. ? Milk. ? Strawberries. 2. Calculate how many servings you have of each food: ? 2 servings rice. ? 1 serving corn. ? 1 serving milk. ? 1 serving strawberries. 3. Multiply each number of servings by 15 g: ? 2 servings rice x 15 g = 30 g. ? 1 serving corn x 15 g = 15 g. ? 1 serving milk x 15 g = 15 g. ? 1  serving strawberries x 15 g = 15 g. 4. Add together all of the amounts to find the total grams of carbohydrates eaten: ? 30 g + 15 g + 15 g + 15 g = 75 g of carbohydrates total. Summary  Carbohydrate counting is a method of keeping track of how many carbohydrates you eat.  Eating carbohydrates naturally increases the amount of sugar (glucose) in the blood.  Counting how many carbohydrates you eat helps keep your blood glucose within normal limits, which helps you manage your diabetes.  A diet and nutrition specialist (registered dietitian) can help you make a meal plan and calculate how many carbohydrates you should have at each meal and snack. This information is not intended to replace advice given to you by your health care provider. Make sure you discuss any questions you have with your health care provider. Document Revised: 02/22/2017 Document Reviewed: 01/12/2016 Elsevier Patient Education  2020 Elsevier Inc.  

## 2020-05-27 NOTE — Assessment & Plan Note (Signed)
Chronic, stable on recheck today in office.  Blood pressure also running at goal at home.  We will continue current medications for now.  BMP checked today.

## 2020-05-27 NOTE — Assessment & Plan Note (Signed)
Chronic, stable on lovastatin.  Will check lipids today, continue lovastatin at current dosing for now.  Follow-up 3 months.

## 2020-05-27 NOTE — Assessment & Plan Note (Signed)
Chronic, ongoing.  Right ear with impacted cerumen-flush today.  Patient tolerated well.  Continue over-the-counter Debrox as needed.

## 2020-05-27 NOTE — Assessment & Plan Note (Signed)
Chronic, stable on trazodone and Remeron.  Continue these medications, refill given.  Follow-up 3 months.

## 2020-05-27 NOTE — Progress Notes (Signed)
BP (!) 122/58 (BP Location: Left Arm, Cuff Size: Normal)   Pulse (!) 101   Temp 98.7 F (37.1 C) (Oral)   Wt 167 lb 3.2 oz (75.8 kg)   SpO2 98%   BMI 25.42 kg/m    Subjective:    Patient ID: Rick [name], male    DOB: November 27, 1940, 79 y.o.   MRN: [id number]  HPI: [name] is a 79 y.o. male presenting for follow up of diabetes and reports his ear is clogged.  Chief Complaint  Patient presents with  . Labs Only    pt requesting to have A1C checked  . Cerumen Impaction    pt requesting to have R ear cleaned out, states its his only good year    DIABETES Hypoglycemic episodes:no Polydipsia/polyuria: no Visual disturbance: no Chest pain: no Paresthesias: yes Glucose Monitoring: yes  Accucheck frequency: random times  Fasting glucose: 110-180 Taking Insulin?: yes  Long acting insulin: Glargine 15 units Blood Pressure Monitoring: daily  BP range: 120/70s Retinal Examination: Up to Date Foot Exam: Up to Date Diabetic Education: Completed Pneumovax: Up to Date Influenza: Not up to Date  Aspirin: no  HYPERTENSION Hypertension status: controlled  Satisfied with current treatment? yes Duration of hypertension: chronic BP monitoring frequency:  daily BP range: 120s/70s BP medication side effects:  no Medication compliance: excellent compliance Previous BP meds: enalapril 10 mg daily Aspirin: no Recurrent headaches: no Visual changes: no Palpitations: no Dyspnea: no Chest pain: no Lower extremity edema: no Dizzy/lightheaded: no  EAG CLOGGED Reports ear "is jammed up" Duration: weeks Involved ear(s): "right Sensation of feeling clogged/plugged: yes Decreased/muffled hearing:yes Ear pain: no Fever: no Otorrhea: no Hearing loss: no Upper respiratory infection symptoms: no Using Q-Tips: no Status: stable History of cerumenosis: yes Treatments attempted: debrox; helps somewhat  Allergies  Allergen Reactions  . Blood-Group Specific Substance   .  Haloperidol Other (See Comments)    Unknown  . Sulfa Antibiotics Other (See Comments)    Unknown   Outpatient Encounter Medications as of 05/27/2020  Medication Sig  . Blood Glucose Monitoring Suppl (ONE TOUCH ULTRA SYSTEM KIT) w/Device KIT 1 kit by Does not apply route once.  . carvedilol (COREG) 6.25 MG tablet Take 1 tablet (6.25 mg total) by mouth 2 (two) times daily with a meal.  . diclofenac sodium (VOLTAREN) 1 % GEL Apply 2 g topically 4 (four) times daily. (Patient taking differently: Apply 2 g topically 4 (four) times daily as needed (lower back pain.). )  . enalapril (VASOTEC) 10 MG tablet Take 1 tablet (10 mg total) by mouth daily.  . fluticasone (FLONASE) 50 MCG/ACT nasal spray Place 2 sprays into both nostrils 2 (two) times daily.  [name] gabapentin (NEURONTIN) 300 MG capsule Take 2 capsules (600 mg total) by mouth 2 (two) times daily.  . hydrochlorothiazide (HYDRODIURIL) 25 MG tablet TAKE ONE (1) TABLET EACH DAY  . Insulin Glargine (BASAGLAR KWIKPEN) 100 UNIT/ML Inject 15 Units into the skin daily.  . Insulin Pen Needle (ULTICARE PEN NEEDLES) 29G X 12MM MISC USE AS DIRECTED  . Lancets (ONETOUCH ULTRASOFT) lancets TEST BLOOD SUGAR THREE TIMES DAILY  . lovastatin (MEVACOR) 40 MG tablet Take 1 tablet (40 mg total) by mouth daily.  . Menthol, Topical Analgesic, (BENGAY EX) Apply 1 application topically 3 (three) times daily as needed (lower back pain.).  [name] metFORMIN (GLUCOPHAGE XR) 500 MG 24 hr tablet Take 2 tablets (1,000 mg total) by mouth in the morning and at bedtime.  [name]  mirtazapine (REMERON) 15 MG tablet Take 1 tablet (15 mg total) by mouth at bedtime.  Glory Rosebush ULTRA test strip TEST BLOOD SUGAR THREE TIMES DAILY.  . silodosin (RAPAFLO) 8 MG CAPS capsule Take 1 capsule (8 mg total) by mouth daily with breakfast.  . traZODone (DESYREL) 100 MG tablet Take 1 tablet (100 mg total) by mouth at bedtime.  . valACYclovir (VALTREX) 1000 MG tablet Take 1 tablet (1,000 mg total) by mouth  daily as needed.  . [DISCONTINUED] carvedilol (COREG) 6.25 MG tablet Take 1 tablet (6.25 mg total) by mouth 2 (two) times daily with a meal.  . [DISCONTINUED] enalapril (VASOTEC) 10 MG tablet Take 1 tablet (10 mg total) by mouth daily.  . [DISCONTINUED] gabapentin (NEURONTIN) 300 MG capsule Take 2 capsules (600 mg total) by mouth 2 (two) times daily.  . [DISCONTINUED] hydrochlorothiazide (HYDRODIURIL) 25 MG tablet TAKE ONE (1) TABLET EACH DAY  . [DISCONTINUED] Insulin Glargine (BASAGLAR KWIKPEN) 100 UNIT/ML Inject 0.15 mLs (15 Units total) into the skin daily.  . [DISCONTINUED] lovastatin (MEVACOR) 40 MG tablet Take 1 tablet (40 mg total) by mouth daily.  . [DISCONTINUED] metFORMIN (GLUCOPHAGE XR) 500 MG 24 hr tablet Take 2 tablets (1,000 mg total) by mouth in the morning and at bedtime.  . [DISCONTINUED] metFORMIN (GLUCOPHAGE) 1000 MG tablet TAKE 1 TABLET BY MOUTH TWICE A DAY WITH A MEAL  . [DISCONTINUED] mirtazapine (REMERON) 15 MG tablet Take 1 tablet (15 mg total) by mouth at bedtime.  . [DISCONTINUED] silodosin (RAPAFLO) 8 MG CAPS capsule Take 1 capsule (8 mg total) by mouth daily with breakfast.  . [DISCONTINUED] traZODone (DESYREL) 100 MG tablet Take 1 tablet (100 mg total) by mouth at bedtime.  . carbidopa-levodopa (SINEMET IR) 25-100 MG tablet Take 1 tablet by mouth 4 (four) times daily.    No facility-administered encounter medications on file as of 05/27/2020.   Patient Active Problem List   Diagnosis Date Noted  . Tremor 05/28/2018  . BPH (benign prostatic hyperplasia) 03/22/2018  . Recurrent UTI 11/29/2017  . Osteoarthritis of multiple joints 08/23/2017  . PVD (peripheral vascular disease) ([place]) 05/14/2017  . Hard of hearing 12/07/2016  . History of tobacco abuse 12/07/2016  . History of ulcerative colitis 12/07/2016  . Ventral hernia 12/21/2015  . History of colon cancer, no staging 12/21/2015  . History of bladder cancer 08/05/2015  . Benign fibroma of prostate 08/05/2015    . History of alcoholism ([place]) 08/05/2015  . History of migraine headaches 08/05/2015  . Calculus of kidney 08/05/2015  . Essential hypertension 05/11/2015  . Hyperlipidemia 05/11/2015  . Affective bipolar disorder ([place]) 03/24/2015  . Diabetic peripheral neuropathy associated with type 2 diabetes mellitus ([place]) 03/24/2015  . Insomnia 03/24/2015  . History of abdominal hernia 02/10/2015  . Anxiety and depression 02/10/2015  . Generalized anxiety disorder 05/18/2014  . Chronic prostatitis 06/12/2013  . Herpesviral infection of penis 11/07/2012  . Incomplete bladder emptying 11/07/2012  . Incisional hernia 11/07/2012  . Balanoposthitis 10/31/2012  . Benign localized prostatic hyperplasia with lower urinary tract symptoms (LUTS) 10/31/2012  . ED (erectile dysfunction) of organic origin 10/31/2012  . 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 Anxiety   . Bipolar disorder ([place])   . Bladder cancer ([place])   . BPH (benign prostatic hypertrophy)   . Chest pain, atypical 06/23/2015  . Closed trimalleolar fracture of left ankle 10/01/2015  . Cystitis    hx of   . Depression   .  Diabetes mellitus without complication ([place])    type 2   . Difficult or painful urination 10/31/2012  . Dysrhythmia   . Ear problem 03/24/2015  . Gangrenous appendicitis   . H/O urinary disorder 03/27/2013  . Herpes genitalis in men   . Hyperlipidemia   . Hypertension   . Insomnia   . Kidney stones   . Left hand pain 05/18/2015  . Mass of arm 02/10/2015  . Peripheral neuropathy   . Skin cancer   . UD (urethral discharge) 10/31/2012  . Urinary tract infection    hx of    Relevant past medical, surgical, family and social history reviewed and updated as indicated. Interim medical history since our last visit reviewed.  Review of Systems  Constitutional: Negative.   HENT: Negative for congestion, dental problem, drooling, ear discharge, ear pain, mouth sores, nosebleeds,  postnasal drip, rhinorrhea, sinus pressure, sinus pain, sore throat, tinnitus and trouble swallowing.        + Ear clogged  Eyes: Negative.  Negative for visual disturbance.  Respiratory: Negative.  Negative for cough, shortness of breath and wheezing.   Cardiovascular: Negative.  Negative for chest pain, palpitations and leg swelling.  Gastrointestinal: Negative.  Negative for nausea and vomiting.  Musculoskeletal: Negative.   Skin: Negative.   Neurological: Negative.   Psychiatric/Behavioral: Negative.     Per HPI unless specifically indicated above     Objective:    BP (!) 122/58 (BP Location: Left Arm, Cuff Size: Normal)   Pulse (!) 101   Temp 98.7 F (37.1 C) (Oral)   Wt 167 lb 3.2 oz (75.8 kg)   SpO2 98%   BMI 25.42 kg/m   Wt Readings from Last 3 Encounters:  05/27/20 167 lb 3.2 oz (75.8 kg)  01/27/20 167 lb (75.8 kg)  12/23/19 170 lb (77.1 kg)    Physical Exam Vitals and nursing note reviewed.  Constitutional:      General: He is not in acute distress.    Appearance: Normal appearance. He is not ill-appearing, toxic-appearing or diaphoretic.  HENT:     Head: Normocephalic and atraumatic.     Right Ear: External ear normal.     Left Ear: External ear normal.     Nose: Nose normal. No congestion.  Eyes:     General: No scleral icterus.    Extraocular Movements: Extraocular movements intact.  Cardiovascular:     Rate and Rhythm: Regular rhythm.     Pulses: Normal pulses.     Heart sounds: Normal heart sounds. No murmur heard.   Pulmonary:     Effort: Pulmonary effort is normal. No respiratory distress.     Breath sounds: Normal breath sounds. No wheezing, rhonchi or rales.  Abdominal:     General: Abdomen is flat. Bowel sounds are normal. There is no distension.     Palpations: Abdomen is soft.  Musculoskeletal:        General: Normal range of motion.     Right lower leg: No edema.     Left lower leg: No edema.  Skin:    General: Skin is warm and dry.      Capillary Refill: Capillary refill takes less than 2 seconds.     Coloration: Skin is not jaundiced or pale.     Findings: No erythema.  Neurological:     General: No focal deficit present.     Mental Status: He is alert and oriented to person, place, and time.     Motor: No  weakness.     Gait: Gait normal.  Psychiatric:        Attention and Perception: Attention normal.        Mood and Affect: Mood and affect normal. Mood is not anxious or depressed.        Speech: Speech normal.        Behavior: Behavior normal.        Thought Content: Thought content normal.        Cognition and Memory: Cognition is impaired.        Judgment: Judgment is impulsive.       Assessment & Plan:   Problem List Items Addressed This Visit      Cardiovascular and Mediastinum   Essential hypertension    Chronic, stable on recheck today in office.  Blood pressure also running at goal at home.  We will continue current medications for now.  BMP checked today.      Relevant Medications   hydrochlorothiazide (HYDRODIURIL) 25 MG tablet   enalapril (VASOTEC) 10 MG tablet   lovastatin (MEVACOR) 40 MG tablet   carvedilol (COREG) 6.25 MG tablet   Other Relevant Orders   Basic Metabolic Panel (BMET)   Lipid Panel w/o Chol/HDL Ratio     Endocrine   Diabetic peripheral neuropathy associated with type 2 diabetes mellitus ([place]) - Primary    Chronic, stable.  Last A1c 6.2%, today A1c is 5.4%.  Continue current regimen.      Relevant Medications   metFORMIN (GLUCOPHAGE XR) 500 MG 24 hr tablet   enalapril (VASOTEC) 10 MG tablet   traZODone (DESYREL) 100 MG tablet   mirtazapine (REMERON) 15 MG tablet   Insulin Glargine (BASAGLAR KWIKPEN) 100 UNIT/ML   lovastatin (MEVACOR) 40 MG tablet   gabapentin (NEURONTIN) 300 MG capsule   Other Relevant Orders   Bayer DCA Hb A1c Waived     Nervous and Auditory   Hard of hearing    Chronic, ongoing.  Right ear with impacted cerumen-flush today.  Patient tolerated  well.  Continue over-the-counter Debrox as needed.        Other   Insomnia    Chronic, stable on trazodone and Remeron.  Continue these medications, refill given.  Follow-up 3 months.      Hyperlipidemia    Chronic, stable on lovastatin.  Will check lipids today, continue lovastatin at current dosing for now.  Follow-up 3 months.      Relevant Medications   hydrochlorothiazide (HYDRODIURIL) 25 MG tablet   enalapril (VASOTEC) 10 MG tablet   lovastatin (MEVACOR) 40 MG tablet   carvedilol (COREG) 6.25 MG tablet   Other Relevant Orders   Basic Metabolic Panel (BMET)   Lipid Panel w/o Chol/HDL Ratio    Other Visit Diagnoses    Dyslipidemia       Relevant Medications   lovastatin (MEVACOR) 40 MG tablet       Follow up plan: Return in about 3 months (around 08/27/2020) for chronic disease f/u.

## 2020-05-27 NOTE — Assessment & Plan Note (Signed)
Chronic, stable.  Last A1c 6.2%, today A1c is 5.4%.  Continue current regimen.

## 2020-05-28 ENCOUNTER — Encounter: Payer: Self-pay | Admitting: Nurse Practitioner

## 2020-05-28 LAB — BASIC METABOLIC PANEL
BUN/Creatinine Ratio: 9 — ABNORMAL LOW (ref 10–24)
BUN: 8 mg/dL (ref 8–27)
CO2: 13 mmol/L — ABNORMAL LOW (ref 20–29)
Calcium: 9.7 mg/dL (ref 8.6–10.2)
Chloride: 107 mmol/L — ABNORMAL HIGH (ref 96–106)
Creatinine, Ser: 0.94 mg/dL (ref 0.76–1.27)
GFR calc Af Amer: 89 mL/min/{1.73_m2} (ref >59)
GFR calc non Af Amer: 77 mL/min/{1.73_m2} (ref >59)
Glucose: 88 mg/dL (ref 65–99)
Potassium: 4.7 mmol/L (ref 3.5–5.2)
Sodium: 144 mmol/L (ref 134–144)

## 2020-05-28 LAB — LIPID PANEL W/O CHOL/HDL RATIO
Cholesterol, Total: 138 mg/dL (ref 100–199)
HDL: 50 mg/dL (ref >39)
LDL Chol Calc (NIH): 73 mg/dL (ref 0–99)
Triglycerides: 77 mg/dL (ref 0–149)
VLDL Cholesterol Cal: 15 mg/dL (ref 5–40)

## 2020-06-03 ENCOUNTER — Telehealth: Payer: Self-pay

## 2020-06-03 NOTE — Telephone Encounter (Signed)
Called pt e stated that he got the letter in the mail with his results for his A1C.  KP

## 2020-06-03 NOTE — Telephone Encounter (Signed)
Copied from [place] [phone]. Topic: General - Other >> May 24, 2020  3:38 PM [name] wrote: PT has some questions about A1C / he has an appt coming uo on Thursday 10/14 / please advise >> Jun 03, 2020  2:28 PM Oneta Rack wrote: Patient returning call regarding A1 C

## 2020-06-03 NOTE — Telephone Encounter (Signed)
Patient checking on the status of message below °

## 2020-06-25 DIAGNOSIS — R338 Other retention of urine: Secondary | ICD-10-CM | POA: Diagnosis not present

## 2020-06-25 DIAGNOSIS — R350 Frequency of micturition: Secondary | ICD-10-CM | POA: Diagnosis not present

## 2020-06-28 ENCOUNTER — Ambulatory Visit: Payer: Medicare Other | Admitting: Nurse Practitioner

## 2020-06-28 DIAGNOSIS — R300 Dysuria: Secondary | ICD-10-CM | POA: Diagnosis not present

## 2020-06-28 DIAGNOSIS — R3914 Feeling of incomplete bladder emptying: Secondary | ICD-10-CM | POA: Diagnosis not present

## 2020-07-24 ENCOUNTER — Other Ambulatory Visit: Payer: Self-pay | Admitting: Family Medicine

## 2020-07-24 NOTE — Telephone Encounter (Signed)
Requested Prescriptions  Pending Prescriptions Disp Refills   fluticasone (FLONASE) 50 MCG/ACT nasal spray [Pharmacy Med Name: FLUTICASONE PROPIONATE 50 MCG/ACT N] 16 g 6    Sig: PLACE 2 SPRAYS INTO BOTH NOSTRILS 2 TIMES DAILY     Ear, Nose, and Throat: Nasal Preparations - Corticosteroids Passed - 07/24/2020  9:06 AM      Passed - Valid encounter within last 12 months    Recent Outpatient Visits          1 month ago Diabetic peripheral neuropathy associated with type 2 diabetes mellitus ([place])   [place], Jessica A, NP   5 months ago Recurrent UTI   [facility] [place], [place] T, NP   7 months ago Diabetic peripheral neuropathy associated with type 2 diabetes mellitus ([place])   [place], Megan P, DO   12 months ago Recurrent UTI   [place], [place], DO   1 year ago Routine general medical examination at a health care facility   [facility], [place], DO

## 2020-08-02 ENCOUNTER — Telehealth: Payer: Self-pay | Admitting: Family Medicine

## 2020-08-02 ENCOUNTER — Ambulatory Visit: Payer: Self-pay | Admitting: Pharmacist

## 2020-08-02 NOTE — Telephone Encounter (Signed)
Spoke with patient. CPA will call to assist in completing his portion of the application and he will mail to the office. He has 11 pens on hand.

## 2020-08-02 NOTE — Chronic Care Management (AMB) (Signed)
°  Chronic Care Management   Note  08/02/2020 Name: Rick [name] MRN: [id number] DOB: 12/27/1940    Follow up plan: Returned call from patient today. He is requesting assistance in completing his renewal application for Basaglar patient assistance through [employer]. CPA will call to assist and patient will mail forms to my attention at [facility] for completion and submission.  He currently takes 15 units daily and has 11 pens on hand. He is not checking his BG regularly. Last A1c 5.4%. Appointment with me in January.  Junita Push. Kenton Kingfisher PharmD, [facility] [phone]

## 2020-08-02 NOTE — Telephone Encounter (Signed)
Pt states he received a new form from [place] to do for the coverage of his insulin.  He would like to discuss with you Almyra Free. He states he would like you to call him between 9 am to 10 am.

## 2020-08-03 ENCOUNTER — Telehealth: Payer: Self-pay | Admitting: Pharmacist

## 2020-08-03 ENCOUNTER — Telehealth: Payer: Self-pay

## 2020-08-03 NOTE — Telephone Encounter (Signed)
Pt stated he was here to sign his applications papper for lilly care states he was suppose to have a paper to sign?Stated Candice called him to let him know they were here, Was unable to communicate with pharmacist or Candice at the time, Pt did leave copies of lilly cares made copies and left in bin to be reviewed . Pt states he would like a call from pharmacist

## 2020-08-03 NOTE — Chronic Care Management (AMB) (Signed)
08/03/20- CPA called and spoke to the patient regarding his patient assistance application for Basaglar. CPA informed the patient that this application was ready at the PCP's office and will need to come in to sign and provide income documentation. Per patient; "why do I need to provide income? I only get 160 something dollars a month." CPA asked if the patient receives a Fish farm manager statement every month or have any other income documentation to process application. The patient verbalized "I don't get a statement, and I don't have no proof of income. I will call [facility] and see if they can give me a statement". CPA informed the patient this will need to be submitted before 08/13/20 in order for his assistance to rollover into the next year so he can continue receiving his Basaglar. The patient verbalized he understands and will call back. CPA verbalized that will be fine.   CPA received a call back from the patient regarding his proof of income documentation. The patient verbalized he was able to obtain his social security proof of income documentation in his house and will be bringing it in the office as requested.   Raynelle Highland, [facility] Assistant [phone]

## 2020-08-23 ENCOUNTER — Telehealth: Payer: Self-pay | Admitting: Family Medicine

## 2020-08-23 ENCOUNTER — Telehealth: Payer: Self-pay

## 2020-08-23 NOTE — Telephone Encounter (Signed)
Patient is calling to discuss bill for $230 billed from [facility] - Patient was billed as a specialist. DOS 05/27/20 Please advise CB- [phone]

## 2020-08-23 NOTE — Telephone Encounter (Signed)
Pt stated his [name] care application was not dated and he is unable to renew applications lilly cares FAX [phone] phone [phone] for his  Basaglar medication.Pt has question requests a phone call. Pt stated date was wrong on lilly application.

## 2020-08-23 NOTE — Telephone Encounter (Signed)
Spoke with Mr. Orsak. He states he left the letter from [employer] at the front office. Will assess and resubmit requested information.

## 2020-08-23 NOTE — Telephone Encounter (Signed)
Attempted to call Rick [name] directly back for the patient. Patient did not want to leave VM. Please advise with the patient. Cb- [phone]

## 2020-08-23 NOTE — Telephone Encounter (Signed)
No letter was left pt only gave phone numbers to call and fax.

## 2020-08-23 NOTE — Telephone Encounter (Signed)
Attempted to call patient  x 2. Unable to leave VM. Will try patient again this afternoon. Please give patient my direct line if he call the office. [phone]. Thank you!

## 2020-08-24 NOTE — Telephone Encounter (Signed)
Spoke to patient today. He has misplaced the letter he received from [facility]. Will resubmit application. Patient stated he received a call from someone at the office this morning. Informed patient missed call was not from pharmacy team.

## 2020-08-26 ENCOUNTER — Telehealth: Payer: Self-pay

## 2020-08-26 ENCOUNTER — Other Ambulatory Visit: Payer: Self-pay | Admitting: Family Medicine

## 2020-08-26 NOTE — Telephone Encounter (Signed)
Pt called in to inquire about $30.00 balance from 05/27/20 he states that he has reached out to his insurance company and they said he has a $0 co pay. Please look into this.

## 2020-08-26 NOTE — Telephone Encounter (Signed)
Requested medication (s) are due for refill today - unsure  Requested medication (s) are on the active medication list -no  Future visit scheduled -yes  Last refill: 04/29/20  Notes to clinic: Requested needle not on current list- sent for review   Requested Prescriptions  Pending Prescriptions Disp Refills   BD PEN NEEDLE NANO U/F 32G X 4 MM MISC [Pharmacy Med Name: BD PEN NEEDLE NANO U/F 32G X 4 MM] 100 each     Sig: USE 1 TIME DAILY AS DIRECTED.      Endocrinology: Diabetes - Testing Supplies Passed - 08/26/2020  9:11 AM      Passed - Valid encounter within last 12 months    Recent Outpatient Visits           3 months ago Diabetic peripheral neuropathy associated with type 2 diabetes mellitus ([place])   [facility] Eulogio Bear, NP   7 months ago Recurrent UTI   [facility] [place], [place] T, NP   8 months ago Diabetic peripheral neuropathy associated with type 2 diabetes mellitus ([place])   [place], Megan P, DO   1 year ago Recurrent UTI   [facility] [place], Megan P, DO   1 year ago Routine general medical examination at a health care facility   [facility], [place] P, DO                    Requested Prescriptions  Pending Prescriptions Disp Refills   BD PEN NEEDLE NANO U/F 32G X 4 MM MISC [Pharmacy Med Name: BD PEN NEEDLE NANO U/F 32G X 4 MM] 100 each     Sig: USE 1 TIME DAILY AS DIRECTED.      Endocrinology: Diabetes - Testing Supplies Passed - 08/26/2020  9:11 AM      Passed - Valid encounter within last 12 months    Recent Outpatient Visits           3 months ago Diabetic peripheral neuropathy associated with type 2 diabetes mellitus ([place])   [place], Jessica A, NP   7 months ago Recurrent UTI   [facility] [place], [place] T, NP   8 months ago Diabetic peripheral neuropathy associated with type 2 diabetes mellitus ([place])   [place], Megan P, DO   1 year ago Recurrent UTI   [place], [place], DO   1 year ago Routine general medical examination at a health care facility   [facility], [place], DO

## 2020-08-27 ENCOUNTER — Ambulatory Visit: Payer: Self-pay

## 2020-09-02 ENCOUNTER — Telehealth: Payer: Self-pay

## 2020-09-02 NOTE — Telephone Encounter (Signed)
Patient came into the office, he would like to discuss the different medications that he is on and especially lantus.  Please reach out to patient.

## 2020-09-02 NOTE — Telephone Encounter (Signed)
Spoke with patient. He is coming in 09/09/19.

## 2020-09-08 ENCOUNTER — Telehealth: Payer: Self-pay | Admitting: Pharmacist

## 2020-09-08 NOTE — Chronic Care Management (AMB) (Incomplete)
Chronic Care Management Pharmacy  Name: HAWK MONES  MRN: [id number] DOB: 1940-10-04   Chief Complaint/ HPI  [name],  80 y.o. , male presents for his {Initial/Follow-up:3041532} CCM visit with the clinical pharmacist {CHL [facility] visit XKGY:[id number]}.  PCP : [name], DO Patient Care Team: [name], DO as PCP - General (Family Medicine) Poggi, Marshall Cork, MD as Consulting Physician (Orthopedic Surgery) Lucas Mallow, MD as Consulting Physician (Urology)  Patient's chronic conditions include: {USCCMDZASSESSMENTOPTIONS:23563}   Office Visits: ***  Consult Visit: ***   Subjective: ***  Objective: Allergies  Allergen Reactions  . Blood-Group Specific Substance   . Haloperidol Other (See Comments)    Unknown  . Sulfa Antibiotics Other (See Comments)    Unknown    Medications: Outpatient Encounter Medications as of 09/08/2020  Medication Sig  . BD PEN NEEDLE NANO U/F 32G X 4 MM MISC USE 1 TIME DAILY AS DIRECTED.  [name] Blood Glucose Monitoring Suppl (ONE TOUCH ULTRA SYSTEM KIT) w/Device KIT 1 kit by Does not apply route once.  . carbidopa-levodopa (SINEMET IR) 25-100 MG tablet Take 1 tablet by mouth 4 (four) times daily.   . carvedilol (COREG) 6.25 MG tablet Take 1 tablet (6.25 mg total) by mouth 2 (two) times daily with a meal.  . diclofenac sodium (VOLTAREN) 1 % GEL Apply 2 g topically 4 (four) times daily. (Patient taking differently: Apply 2 g topically 4 (four) times daily as needed (lower back pain.). )  . enalapril (VASOTEC) 10 MG tablet Take 1 tablet (10 mg total) by mouth daily.  . fluticasone (FLONASE) 50 MCG/ACT nasal spray PLACE 2 SPRAYS INTO BOTH NOSTRILS 2 TIMES DAILY  . gabapentin (NEURONTIN) 300 MG capsule Take 2 capsules (600 mg total) by mouth 2 (two) times daily.  . hydrochlorothiazide (HYDRODIURIL) 25 MG tablet TAKE ONE (1) TABLET EACH DAY  . Insulin Glargine (BASAGLAR KWIKPEN) 100 UNIT/ML Inject 15 Units into the  skin daily.  . Insulin Pen Needle (ULTICARE PEN NEEDLES) 29G X 12MM MISC USE AS DIRECTED  . Lancets (ONETOUCH ULTRASOFT) lancets TEST BLOOD SUGAR THREE TIMES DAILY  . lovastatin (MEVACOR) 40 MG tablet Take 1 tablet (40 mg total) by mouth daily.  . Menthol, Topical Analgesic, (BENGAY EX) Apply 1 application topically 3 (three) times daily as needed (lower back pain.).  [name] metFORMIN (GLUCOPHAGE XR) 500 MG 24 hr tablet Take 2 tablets (1,000 mg total) by mouth in the morning and at bedtime.  . mirtazapine (REMERON) 15 MG tablet Take 1 tablet (15 mg total) by mouth at bedtime.  Glory Rosebush ULTRA test strip TEST BLOOD SUGAR THREE TIMES DAILY.  . silodosin (RAPAFLO) 8 MG CAPS capsule Take 1 capsule (8 mg total) by mouth daily with breakfast.  . traZODone (DESYREL) 100 MG tablet Take 1 tablet (100 mg total) by mouth at bedtime.  . valACYclovir (VALTREX) 1000 MG tablet Take 1 tablet (1,000 mg total) by mouth daily as needed.   No facility-administered encounter medications on file as of 09/08/2020.    Wt Readings from Last 3 Encounters:  05/27/20 167 lb 3.2 oz (75.8 kg)  01/27/20 167 lb (75.8 kg)  12/23/19 170 lb (77.1 kg)    Lab Results  Component Value Date   CREATININE 0.94 05/27/2020   BUN 8 05/27/2020   GFRNONAA 77 05/27/2020   GFRAA 89 05/27/2020   NA 144 05/27/2020   K 4.7 05/27/2020   CALCIUM 9.7 05/27/2020   CO2 13 (L)  05/27/2020     Current Diagnosis/Assessment:    Goals Addressed   None     Diabetes   A1c goal {A1c goals:23924}  Recent Relevant Labs: Lab Results  Component Value Date/Time   HGBA1C 5.4 05/27/2020 10:30 AM   HGBA1C 6.2 12/23/2019 03:13 PM   HGBA1C 6.3 01/05/2016 12:00 AM   MICROALBUR 10 12/23/2019 03:13 PM   MICROALBUR 10 07/08/2019 10:51 AM    Last diabetic Eye exam:  Lab Results  Component Value Date/Time   HMDIABEYEEXA No Retinopathy 01/23/2020 12:00 AM    Last diabetic Foot exam: No results found for: HMDIABFOOTEX   Checking BG: when he  eats starches  He remembers one reading of 135 yesterday  Patient has failed these meds in past: *** Patient is currently controlled on the following medications: . Basasglar 15 units qam . Metformin 1000 mg bid  We discussed: Eats mostly at home and stays active. He checks BG when he eats starches or feels symptomatic. He likes to play basketball when the weather. He denies any symptoms of hypoglycemia and states the lowest reading he has seen is 70.  Plan  Continue current medications and control with diet and exercise  Hypertension   BP goal is:  <130/80  Office blood pressures are  BP Readings from Last 3 Encounters:  05/27/20 (!) 122/58  01/27/20 138/85  12/23/19 (!) 145/87   Patient checks BP at home twice daily Patient home BP readings are ranging: 120/75  Patient has failed these meds in the past: *** Patient is currently {CHL Controlled/Uncontrolled:[phone]} on the following medications:  . ***  We discussed {[facility] [phone]}  Plan  Continue {[facility] XKGYJ:[id number]}     ***   Patient has failed these meds in past: *** Patient is currently {CHL Controlled/Uncontrolled:[phone]} on the following medications:  . ***  We discussed:  ***  Plan  Continue {CHL [facility] OVZCH:[id number]}    Medication Management   Patient's preferred pharmacy is:  [facility] [place] [place] [address] [place] [address] Phone: [phone] Fax: [phone]  Uses pill box? {Yes or If no, why not?:20788} Pt endorses ***% compliance  We discussed: {Pharmacy options:24294}  Plan  {US Pharmacy LYYT:03546}    Follow up: *** month phone visit  ***

## 2020-09-30 ENCOUNTER — Encounter: Payer: Self-pay | Admitting: Family Medicine

## 2020-09-30 ENCOUNTER — Encounter: Payer: Self-pay | Admitting: Emergency Medicine

## 2020-09-30 ENCOUNTER — Emergency Department
Admission: EM | Admit: 2020-09-30 | Discharge: 2020-09-30 | Disposition: A | Discharge status: Home / Self Care | Payer: Medicare Other | Attending: Emergency Medicine | Admitting: Emergency Medicine

## 2020-09-30 ENCOUNTER — Ambulatory Visit (INDEPENDENT_AMBULATORY_CARE_PROVIDER_SITE_OTHER): Payer: Medicare Other | Admitting: Family Medicine

## 2020-09-30 ENCOUNTER — Other Ambulatory Visit: Payer: Self-pay

## 2020-09-30 VITALS — BP 134/66 | HR 94 | Temp 97.7°F | Wt 172.4 lb

## 2020-09-30 DIAGNOSIS — I10 Essential (primary) hypertension: Secondary | ICD-10-CM | POA: Diagnosis not present

## 2020-09-30 DIAGNOSIS — Z87891 Personal history of nicotine dependence: Secondary | ICD-10-CM | POA: Insufficient documentation

## 2020-09-30 DIAGNOSIS — Z23 Encounter for immunization: Secondary | ICD-10-CM

## 2020-09-30 DIAGNOSIS — Z79899 Other long term (current) drug therapy: Secondary | ICD-10-CM | POA: Diagnosis not present

## 2020-09-30 DIAGNOSIS — L03012 Cellulitis of left finger: Secondary | ICD-10-CM

## 2020-09-30 DIAGNOSIS — Z8551 Personal history of malignant neoplasm of bladder: Secondary | ICD-10-CM | POA: Diagnosis not present

## 2020-09-30 DIAGNOSIS — W5501XA Bitten by cat, initial encounter: Secondary | ICD-10-CM | POA: Diagnosis not present

## 2020-09-30 DIAGNOSIS — S61452A Open bite of left hand, initial encounter: Secondary | ICD-10-CM | POA: Diagnosis not present

## 2020-09-30 DIAGNOSIS — E119 Type 2 diabetes mellitus without complications: Secondary | ICD-10-CM | POA: Insufficient documentation

## 2020-09-30 DIAGNOSIS — Z794 Long term (current) use of insulin: Secondary | ICD-10-CM | POA: Insufficient documentation

## 2020-09-30 DIAGNOSIS — Z8554 Personal history of malignant neoplasm of ureter: Secondary | ICD-10-CM | POA: Insufficient documentation

## 2020-09-30 DIAGNOSIS — Z7984 Long term (current) use of oral hypoglycemic drugs: Secondary | ICD-10-CM | POA: Insufficient documentation

## 2020-09-30 DIAGNOSIS — Z85038 Personal history of other malignant neoplasm of large intestine: Secondary | ICD-10-CM | POA: Insufficient documentation

## 2020-09-30 DIAGNOSIS — Z85828 Personal history of other malignant neoplasm of skin: Secondary | ICD-10-CM | POA: Insufficient documentation

## 2020-09-30 DIAGNOSIS — L03114 Cellulitis of left upper limb: Secondary | ICD-10-CM | POA: Diagnosis not present

## 2020-09-30 DIAGNOSIS — R2232 Localized swelling, mass and lump, left upper limb: Secondary | ICD-10-CM | POA: Diagnosis not present

## 2020-09-30 LAB — CBC WITH DIFFERENTIAL/PLATELET
Abs Immature Granulocytes: 0.03 10*3/uL (ref 0.00–0.07)
Basophils Absolute: 0.1 10*3/uL (ref 0.0–0.1)
Basophils Relative: 1 %
Eosinophils Absolute: 0.2 10*3/uL (ref 0.0–0.5)
Eosinophils Relative: 2 %
HCT: 35.1 % — ABNORMAL LOW (ref 39.0–52.0)
Hemoglobin: 11.6 g/dL — ABNORMAL LOW (ref 13.0–17.0)
Immature Granulocytes: 0 %
Lymphocytes Relative: 16 %
Lymphs Abs: 1.6 10*3/uL (ref 0.7–4.0)
MCH: 29.8 pg (ref 26.0–34.0)
MCHC: 33 g/dL (ref 30.0–36.0)
MCV: 90.2 fL (ref 80.0–100.0)
Monocytes Absolute: 1.2 10*3/uL — ABNORMAL HIGH (ref 0.1–1.0)
Monocytes Relative: 12 %
Neutro Abs: 7.2 10*3/uL (ref 1.7–7.7)
Neutrophils Relative %: 69 %
Platelets: 209 10*3/uL (ref 150–400)
RBC: 3.89 MIL/uL — ABNORMAL LOW (ref 4.22–5.81)
RDW: 13.8 % (ref 11.5–15.5)
WBC: 10.4 10*3/uL (ref 4.0–10.5)
nRBC: 0 % (ref 0.0–0.2)

## 2020-09-30 LAB — BASIC METABOLIC PANEL
Anion gap: 9 (ref 5–15)
BUN: 14 mg/dL (ref 8–23)
CO2: 23 mmol/L (ref 22–32)
Calcium: 9.1 mg/dL (ref 8.9–10.3)
Chloride: 107 mmol/L (ref 98–111)
Creatinine, Ser: 0.97 mg/dL (ref 0.61–1.24)
GFR, Estimated: >60 mL/min (ref >60)
Glucose, Bld: 75 mg/dL (ref 70–99)
Potassium: 3.7 mmol/L (ref 3.5–5.1)
Sodium: 139 mmol/L (ref 135–145)

## 2020-09-30 MED ORDER — PIPERACILLIN-TAZOBACTAM 3.375 G IVPB 30 MIN
3.3750 g | Freq: Once | INTRAVENOUS | Status: AC
  Administered 2020-09-30: 3.375 g via INTRAVENOUS
  Filled 2020-09-30: qty 50

## 2020-09-30 MED ORDER — AMOXICILLIN-POT CLAVULANATE 875-125 MG PO TABS: 1.0000 | ORAL_TABLET | Freq: Two times a day (BID) | ORAL | 0 refills | Status: DC

## 2020-09-30 MED ORDER — METFORMIN HCL ER 500 MG PO TB24
1000.0000 mg | ORAL_TABLET | Freq: Two times a day (BID) | ORAL | 0 refills | Status: DC
Start: 2020-09-30 — End: 2021-02-01

## 2020-09-30 NOTE — ED Notes (Signed)
Discharge instructions reviewed with pt, pt calm , collective , denied pain or sob.  

## 2020-09-30 NOTE — Patient Instructions (Addendum)
12:15 with Montel Clock [address] [place], [place] [address]  Animal Bite, Adult Animal bite wounds can be mild or serious. It is important to get medical treatment to prevent infection. Ask your doctor if you need treatment to prevent an infection that can spread from animals to humans (rabies). Follow these instructions at home: Wound care  Follow instructions from your doctor about how to take care of your wound. Make sure you: ? Wash your hands with soap and water before you change your bandage (dressing). If you cannot use soap and water, use hand sanitizer. ? Change your bandage as told by your doctor. ? Leave stitches (sutures), skin glue, or skin tape (adhesive) strips in place. They may need to stay in place for 2 weeks or longer. If tape strips get loose and curl up, you may trim the loose edges. Do not remove tape strips completely unless your doctor says it is okay.  Check your wound every day for signs of infection. Check for: ? More redness, swelling, or pain. ? More fluid or blood. ? Warmth. ? Pus or a bad smell.   Medicines  Take or apply over-the-counter and prescription medicines only as told by your doctor.  If you were prescribed an antibiotic, take or apply it as told by your doctor. Do not stop using the antibiotic even if your wound gets better. General instructions  Keep the injured area raised (elevated) above the level of your heart while you are sitting or lying down.  If directed, put ice on the injured area. ? Put ice in a plastic bag. ? Place a towel between your skin and the bag. ? Leave the ice on for 20 minutes, 2-3 times per day.  Keep all follow-up visits as told by your doctor. This is important.   Contact a doctor if:  You have more redness, swelling, or pain around your wound.  Your wound feels warm to the touch.  You have a fever or chills.  You have a general feeling of sickness (malaise).  You feel sick to your stomach  (nauseous).  You throw up (vomit).  You have pain that does not get better. Get help right away if:  You have a red streak going away from your wound.  You have any of these coming from your wound: ? Non-clear fluid. ? More blood. ? Pus or a bad smell.  You have trouble moving your injured area.  You lose feeling (have numbness) or feel tingling anywhere on your body. Summary  It is important to get the right medical treatment for animal bites. Treatment can help you to not get an infection. Ask your doctor if you need treatment to prevent an infection that can spread from animals to humans (rabies).  Check your wound every day for signs of infection, such as more redness or swelling instead of less.  If you have a red streak going away from your wound, get medical help right away. This information is not intended to replace advice given to you by your health care provider. Make sure you discuss any questions you have with your health care provider. Document Revised: 07/26/2017 Document Reviewed: 02/08/2017 Elsevier Patient Education  2021 [employer].

## 2020-09-30 NOTE — Discharge Instructions (Addendum)
Follow discharge care instruction continue antibiotics prescribed by your PCP today.

## 2020-09-30 NOTE — Progress Notes (Signed)
BP 134/66   Pulse 94   Temp 97.7 F (36.5 C) (Oral)   Wt 172 lb 6.4 oz (78.2 kg)   SpO2 96%   BMI 26.21 kg/m    Subjective:    Patient ID: Rick [name], male    DOB: 1940/08/22, 80 y.o.   MRN: [id number]  HPI: Rick [name] is a 80 y.o. male  Chief Complaint  Patient presents with  . Animal Bite    Pt states he is here to follow up on a growth under his R arm and pt states a cat bit his L hand yesterday. Hand is swollen and red.    SKIN INFECTION- He got bit by a cat yesterday and his hand blew up and became very red and swollen. It's been hot and has been hurting.  Duration: 1 day Location: L hand History of trauma in area: yes Pain: yes Quality: aching Severity: mildH3 Redness: yes Swelling: yes Oozing: no Pus: no Fevers: no Nausea/vomiting: no Status: better Treatments attempted:none  Tetanus: Not UTD  Relevant past medical, surgical, family and social history reviewed and updated as indicated. Interim medical history since our last visit reviewed. Allergies and medications reviewed and updated.  Review of Systems  Constitutional: Negative.   Respiratory: Negative.   Cardiovascular: Negative.   Gastrointestinal: Negative for abdominal distention.  Musculoskeletal: Positive for arthralgias and joint swelling. Negative for back pain, gait problem, myalgias, neck pain and neck stiffness.  Skin: Positive for color change and wound. Negative for pallor and rash.  Psychiatric/Behavioral: Negative.     Per HPI unless specifically indicated above     Objective:    BP 134/66   Pulse 94   Temp 97.7 F (36.5 C) (Oral)   Wt 172 lb 6.4 oz (78.2 kg)   SpO2 96%   BMI 26.21 kg/m   Wt Readings from Last 3 Encounters:  09/30/20 171 lb (77.6 kg)  09/30/20 172 lb 6.4 oz (78.2 kg)  05/27/20 167 lb 3.2 oz (75.8 kg)    Physical Exam Vitals and nursing note reviewed.  Constitutional:      General: He is not in acute distress.    Appearance: Normal  appearance. He is not ill-appearing, toxic-appearing or diaphoretic.  HENT:     Head: Normocephalic and atraumatic.     Right Ear: External ear normal.     Left Ear: External ear normal.     Nose: Nose normal.     Mouth/Throat:     Mouth: Mucous membranes are moist.     Pharynx: Oropharynx is clear.  Eyes:     General: No scleral icterus.       Right eye: No discharge.        Left eye: No discharge.     Extraocular Movements: Extraocular movements intact.     Conjunctiva/sclera: Conjunctivae normal.     Pupils: Pupils are equal, round, and reactive to light.  Cardiovascular:     Rate and Rhythm: Normal rate and regular rhythm.     Pulses: Normal pulses.     Heart sounds: Normal heart sounds. No murmur heard. No friction rub. No gallop.   Pulmonary:     Effort: Pulmonary effort is normal. No respiratory distress.     Breath sounds: Normal breath sounds. No stridor. No wheezing, rhonchi or rales.  Chest:     Chest wall: No tenderness.  Musculoskeletal:        General: Normal range of motion.     Cervical back:  Normal range of motion and neck supple.  Skin:    General: Skin is warm and dry.     Capillary Refill: Capillary refill takes less than 2 seconds.     Coloration: Skin is not jaundiced or pale.     Findings: Erythema present. No bruising, lesion or rash.     Comments: Heat and swelling with erythema from 1st finger with streaking up his arm to his elbow.  Neurological:     General: No focal deficit present.     Mental Status: He is alert and oriented to person, place, and time. Mental status is at baseline.  Psychiatric:        Mood and Affect: Mood normal.        Behavior: Behavior normal.        Thought Content: Thought content normal.        Judgment: Judgment normal.     Results for orders placed or performed in visit on 25/85/27  Basic Metabolic Panel (BMET)  Result Value Ref Range   Glucose 88 65 - 99 mg/dL   BUN 8 8 - 27 mg/dL   Creatinine, Ser 0.94 0.76  - 1.27 mg/dL   GFR calc non Af Amer 77 >59 mL/min/1.73   GFR calc Af Amer 89 >59 mL/min/1.73   BUN/Creatinine Ratio 9 (L) 10 - 24   Sodium 144 134 - 144 mmol/L   Potassium 4.7 3.5 - 5.2 mmol/L   Chloride 107 (H) 96 - 106 mmol/L   CO2 13 (L) 20 - 29 mmol/L   Calcium 9.7 8.6 - 10.2 mg/dL  Lipid Panel w/o Chol/HDL Ratio  Result Value Ref Range   Cholesterol, Total 138 100 - 199 mg/dL   Triglycerides 77 0 - 149 mg/dL   HDL 50 >39 mg/dL   VLDL Cholesterol Cal 15 5 - 40 mg/dL   LDL Chol Calc (NIH) 73 0 - 99 mg/dL  Bayer DCA Hb A1c Waived  Result Value Ref Range   HB A1C (BAYER DCA - WAIVED) 5.4 <7.0 %      Assessment & Plan:   Problem List Items Addressed This Visit   None   Visit Diagnoses    Cellulitis of finger of left hand    -  Primary   Due to bite Will get Td today and treat with augmentin. Due to cat bite and severity of swelling, will get him into hand speciality today- may need debridement.   Relevant Orders   Ambulatory referral to Hand Surgery   Td : Tetanus/diphtheria >7yo Preservative  free (Completed)   Cat bite, initial encounter       Cellulitis. Will get Td today and treat with augmentin. Due to cat bite and severity of swelling, will get him into hand speciality today- may need debridement.   Relevant Orders   Ambulatory referral to Hand Surgery   Td : Tetanus/diphtheria >7yo Preservative  free (Completed)       Follow up plan: Return next week, DM follow up and shave bx.

## 2020-09-30 NOTE — ED Notes (Signed)
Pt continuously in and out of room. Pt given phone to talk to ex-wife. Pt in hall loudly talking on phone. This RN guided pt back to room stating he could speak with his support person in his room. Pt raising his voice at this RN. Pt stating, "How much longer am I going to be here, I'm ready to go home." RNs explained it should not be much longer but we need pt's to remain in their room for safety of themselves and other pt's. Pt stating, "Well I am fully vaccinated." This RN explaining to pt that you can still acquire COVID despite being vaccinated.

## 2020-09-30 NOTE — ED Provider Notes (Signed)
[facility] Emergency Department Provider Note   ____________________________________________   Event Date/Time   First MD Initiated Contact with Patient 09/30/20 1334     (approximate)  I have reviewed the triage vital signs and the nursing notes.   HISTORY  Chief Complaint Animal Bite (Pt stated got bit on his left hand yesterday, from his cat  . )    HPI Rick [name] is a 80 y.o. male [facility] secondary to edema and erythema to the dorsal aspect of left hand.  Patient was bitten by a cat yesterday.  Denies pain at this time.  Denies loss sensation or loss of function of the left hand.         Past Medical History:  Diagnosis Date  . Anxiety   . Bipolar disorder ([place])   . Bladder cancer ([place])   . BPH (benign prostatic hypertrophy)   . Chest pain, atypical 06/23/2015  . Closed trimalleolar fracture of left ankle 10/01/2015  . Cystitis    hx of   . Depression   . Diabetes mellitus without complication ([place])    type 2   . Difficult or painful urination 10/31/2012  . Dysrhythmia   . Ear problem 03/24/2015  . Gangrenous appendicitis   . H/O urinary disorder 03/27/2013  . Herpes genitalis in men   . Hyperlipidemia   . Hypertension   . Insomnia   . Kidney stones   . Left hand pain 05/18/2015  . Mass of arm 02/10/2015  . Peripheral neuropathy   . Skin cancer   . UD (urethral discharge) 10/31/2012  . Urinary tract infection    hx of     Patient Active Problem List   Diagnosis Date Noted  . Tremor 05/28/2018  . BPH (benign prostatic hyperplasia) 03/22/2018  . Recurrent UTI 11/29/2017  . Osteoarthritis of multiple joints 08/23/2017  . PVD (peripheral vascular disease) ([place]) 05/14/2017  . Hard of hearing 12/07/2016  . History of tobacco abuse 12/07/2016  . History of ulcerative colitis 12/07/2016  . Ventral hernia 12/21/2015  . History of colon cancer, no staging 12/21/2015  . History of bladder  cancer 08/05/2015  . Benign fibroma of prostate 08/05/2015  . History of alcoholism ([place]) 08/05/2015  . History of migraine headaches 08/05/2015  . Calculus of kidney 08/05/2015  . Essential hypertension 05/11/2015  . Hyperlipidemia 05/11/2015  . Affective bipolar disorder ([place]) 03/24/2015  . Diabetic peripheral neuropathy associated with type 2 diabetes mellitus ([place]) 03/24/2015  . Insomnia 03/24/2015  . History of abdominal hernia 02/10/2015  . Anxiety and depression 02/10/2015  . Generalized anxiety disorder 05/18/2014  . Chronic prostatitis 06/12/2013  . Herpesviral infection of penis 11/07/2012  . Incomplete bladder emptying 11/07/2012  . Incisional hernia 11/07/2012  . Balanoposthitis 10/31/2012  . Benign localized prostatic hyperplasia with lower urinary tract symptoms (LUTS) 10/31/2012  . ED (erectile dysfunction) of organic origin 10/31/2012  . History of cancer of ureter 10/31/2012    Past Surgical History:  Procedure Laterality Date  . APPENDECTOMY     RUPTURED  . BLADDER SURGERY    . CHOLECYSTECTOMY    . COLOSTOMY     AND LATER CLOSURE  . CYSTOSCOPY/URETEROSCOPY/HOLMIUM LASER/STENT PLACEMENT Left 03/22/2018   Procedure: CYSTOSCOPY/LEFT URETEROSCOPY/LEFT RETROGRADE PYELOGRAM;  Surgeon: Lucas Mallow, MD;  Location: [facility];  Service: Urology;  Laterality: Left;  . EPIDIDYMECTOMY N/A 01/03/2019   Procedure: RIGHT EPIDIDYMAL CYST REMOVAL VERSES EPIDIDMECTOMY;  Surgeon: Gloriann Loan,  Desiree Hane, MD;  Location: [facility];  Service: Urology;  Laterality: N/A;  . FRACTURE SURGERY    . HERNIA REPAIR    . INNER EAR SURGERY Left   . ORIF ANKLE FRACTURE Left 09/27/2015   Procedure: OPEN REDUCTION INTERNAL FIXATION (ORIF) ANKLE FRACTURE;  Surgeon: Corky Mull, MD;  Location: [facility];  Service: Orthopedics;  Laterality: Left;  . SKIN CANCER EXCISION    . TRANSURETHRAL RESECTION OF PROSTATE N/A 03/22/2018   Procedure: TRANSURETHRAL RESECTION OF THE PROSTATE (TURP);  Surgeon: Lucas Mallow, MD;  Location: [facility];  Service: Urology;  Laterality: N/A;  . VENTRAL HERNIA REPAIR N/A 01/14/2016   Procedure: HERNIA REPAIR VENTRAL ADULT;  Surgeon: Leonie Green, MD;  Location: [facility];  Service: General;  Laterality: N/A;    Prior to Admission medications   Medication Sig Start Date End Date Taking? Authorizing Provider  amoxicillin-clavulanate (AUGMENTIN) 875-125 MG tablet Take 1 tablet by mouth 2 (two) times daily. 09/30/20   Johnson, Megan P, DO  BD PEN NEEDLE NANO U/F 32G X 4 MM MISC USE 1 TIME DAILY AS DIRECTED. 08/26/20   Johnson, Megan P, DO  Blood Glucose Monitoring Suppl (ONE TOUCH ULTRA SYSTEM KIT) w/Device KIT 1 kit by Does not apply route once. 02/11/16   Kathrine Haddock, NP  carbidopa-levodopa (SINEMET IR) 25-100 MG tablet Take 1 tablet by mouth 4 (four) times daily.  10/11/18 12/24/19  [provider]  carvedilol (COREG) 6.25 MG tablet Take 1 tablet (6.25 mg total) by mouth 2 (two) times daily with a meal. 05/27/20 05/27/21  Noemi Chapel A, NP  diclofenac sodium (VOLTAREN) 1 % GEL Apply 2 g topically 4 (four) times daily. Patient taking differently: Apply 2 g topically 4 (four) times daily as needed (lower back pain.). 08/20/17   Volney American, PA-C  enalapril (VASOTEC) 10 MG tablet Take 1 tablet (10 mg total) by mouth daily. 05/27/20   Eulogio Bear, NP  fluticasone (FLONASE) 50 MCG/ACT nasal spray PLACE 2 SPRAYS INTO BOTH NOSTRILS 2 TIMES DAILY 07/24/20   Park Liter P, DO  gabapentin (NEURONTIN) 300 MG capsule Take 2 capsules (600 mg total) by mouth 2 (two) times daily. Patient taking differently: Take 600 mg by mouth 2 (two) times daily. Taking as needed 05/27/20   Eulogio Bear, NP  hydrochlorothiazide (HYDRODIURIL) 25 MG tablet TAKE ONE (1) TABLET EACH DAY Patient taking differently: TAKE ONE (1) TABLET EACH DAY taking as needed 05/27/20   Eulogio Bear, NP  Insulin Glargine (BASAGLAR KWIKPEN) 100 UNIT/ML Inject 15  Units into the skin daily. 05/27/20   Eulogio Bear, NP  Insulin Pen Needle (ULTICARE PEN NEEDLES) 29G X 12MM MISC USE AS DIRECTED 08/05/19   Johnson, Megan P, DO  Lancets [facility] ULTRASOFT) lancets TEST BLOOD SUGAR THREE TIMES DAILY 04/22/19   Wynetta Emery, Megan P, DO  lovastatin (MEVACOR) 40 MG tablet Take 1 tablet (40 mg total) by mouth daily. 05/27/20   Eulogio Bear, NP  Menthol, Topical Analgesic, (BENGAY EX) Apply 1 application topically 3 (three) times daily as needed (lower back pain.).    [provider]  metFORMIN (GLUCOPHAGE XR) 500 MG 24 hr tablet Take 2 tablets (1,000 mg total) by mouth in the morning and at bedtime. 09/30/20   Johnson, Megan P, DO  mirtazapine (REMERON) 15 MG tablet Take 1 tablet (15 mg total) by mouth at bedtime. Patient not taking: No sig reported 05/27/20   Eulogio Bear, NP  ONETOUCH ULTRA test strip TEST BLOOD SUGAR THREE TIMES DAILY. 04/22/19   Park Liter P, DO  silodosin (RAPAFLO) 8 MG CAPS capsule Take 1 capsule (8 mg total) by mouth daily with breakfast. Patient not taking: No sig reported 05/27/20   Eulogio Bear, NP  traZODone (DESYREL) 100 MG tablet Take 1 tablet (100 mg total) by mouth at bedtime. Patient taking differently: Take 100 mg by mouth at bedtime. Takes 1/2 tablet 05/27/20   Eulogio Bear, NP  valACYclovir (VALTREX) 1000 MG tablet Take 1 tablet (1,000 mg total) by mouth daily as needed. 12/23/19   Park Liter P, DO    Allergies Blood-group specific substance, Haloperidol, and Sulfa antibiotics  Family History  Problem Relation Age of Onset  . Transient ischemic attack Mother   . Diabetes Father   . Cancer Brother     Social History Social History   Tobacco Use  . Smoking status: Former Smoker    Packs/day: 1.50    Years: 15.00    Pack years: 22.50    Types: Cigarettes    Start date: 08/14/1994    Quit date: 03/23/2005    Years since quitting: 15.5  . Smokeless tobacco: Former Systems developer     Types: Snuff, Sarina Ser    Quit date: 03/23/2010  Vaping Use  . Vaping Use: Never used  Substance Use Topics  . Alcohol use: No    Alcohol/week: 0.0 standard drinks  . Drug use: No    Review of Systems Constitutional: No fever/chills Eyes: No visual changes. ENT: No sore throat. Cardiovascular: Denies chest pain. Respiratory: Denies shortness of breath. Gastrointestinal: No abdominal pain.  No nausea, no vomiting.  No diarrhea.  No constipation. Genitourinary: Negative for dysuria. Musculoskeletal: Negative for back pain. Skin: Negative for rash.  Edema and erythema dorsal aspect of left hand. Neurological: Negative for headaches, focal weakness or numbness. Psychiatric:  Anxiety, bipolar, and depression. Endocrine:  Diabetes, hyperlipidemia, and hypertension. Hematological/Lymphatic:  Allergic/Immunilogical: Haldol and sulfur antibiotics. ____________________________________________   PHYSICAL EXAM:  VITAL SIGNS: ED Triage Vitals  Enc Vitals Group     BP 09/30/20 1304 (!) 148/90     Pulse Rate 09/30/20 1304 100     Resp 09/30/20 1304 18     Temp 09/30/20 1304 98 F (36.7 C)     Temp Source 09/30/20 1304 Oral     SpO2 09/30/20 1304 100 %     Weight 09/30/20 1305 171 lb (77.6 kg)     Height 09/30/20 1305 _0  (1.753 m)     Head Circumference --      Peak Flow --      Pain Score 09/30/20 1304 0     Pain Loc --      Pain Edu? --      Excl. in [place]? --    Constitutional: Alert and oriented. Well appearing and in no acute distress. Eyes: Conjunctivae are normal. PERRL. EOMI. Head: Atraumatic. Nose: No congestion/rhinnorhea. Mouth/Throat: Mucous membranes are moist.  Oropharynx non-erythematous. Neck: No stridor.  No cervical spine tenderness to palpation. Hematological/Lymphatic/Immunilogical: No cervical lymphadenopathy. Cardiovascular: Normal rate, regular rhythm. Grossly normal heart sounds.  Good peripheral circulation. Respiratory: Normal respiratory effort.  No  retractions. Lungs CTAB. Musculoskeletal: Edema and erythema to the dorsal aspect of left hand. Neurologic:  Normal speech and language. No gross focal neurologic deficits are appreciated. No gait instability. Skin:  Skin is warm, dry and intact. No rash noted. Psychiatric: Mood and affect are normal. Speech and behavior are  normal.  ____________________________________________   LABS (all labs ordered are listed, but only abnormal results are displayed)  Labs Reviewed  CBC WITH DIFFERENTIAL/PLATELET - Abnormal; Notable for the following components:      Result Value   RBC 3.89 (*)    Hemoglobin 11.6 (*)    HCT 35.1 (*)    Monocytes Absolute 1.2 (*)    All other components within normal limits  BASIC METABOLIC PANEL   ____________________________________________  EKG   ____________________________________________  RADIOLOGY I, Sable Feil, personally viewed and evaluated these images (plain radiographs) as part of my medical decision making, as well as reviewing the written report by the radiologist.  ED MD interpretation:    Official radiology report(s): No results found.  ____________________________________________   PROCEDURES  Procedure(s) performed (including Critical Care):  Procedures   ____________________________________________   INITIAL IMPRESSION / ASSESSMENT AND PLAN / ED COURSE  As part of my medical decision making, I reviewed the following data within the [place]         Patient presents with edema erythema to the dorsal aspect of the left hand secondary to a cat bite yesterday.  Discussed lab results with patient.  Patient was given Zosyn IV and discharge with instructions to start Augmentin as directed.  Patient advised to follow-up with PCP.  Return to ED if condition worsens.      ____________________________________________   FINAL CLINICAL IMPRESSION(S) / ED DIAGNOSES  Final diagnoses:  Cat bite, initial  encounter     ED Discharge Orders    None      *Please note:  SHAMERE CAMPAS was evaluated in Emergency Department on 09/30/2020 for the symptoms described in the history of present illness. He was evaluated in the context of the global COVID-19 pandemic, which necessitated consideration that the patient might be at risk for infection with the SARS-CoV-2 virus that causes COVID-19. Institutional protocols and algorithms that pertain to the evaluation of patients at risk for COVID-19 are in a state of rapid change based on information released by regulatory bodies including the CDC and federal and state organizations. These policies and algorithms were followed during the patient's care in the ED.  Some ED evaluations and interventions may be delayed as a result of limited staffing during and the pandemic.*   Note:  This document was prepared using Dragon voice recognition software and may include unintentional dictation errors.    Sable Feil, PA-C 09/30/20 1513    Carrie Mew, MD 09/30/20 [phone]

## 2020-09-30 NOTE — ED Triage Notes (Addendum)
[facility]x treatment for redness swelling of the left hand. Pt is ambulatory with NAD noted. Steady gait. A/ox4  . Follow-up    Pt states he is here to follow up on a growth under his R arm and pt states a cat bit his L hand yesterday. Hand is swollen and red.

## 2020-10-08 ENCOUNTER — Ambulatory Visit: Payer: Medicare Other | Admitting: Family Medicine

## 2020-10-29 ENCOUNTER — Telehealth: Payer: Self-pay | Admitting: Pharmacist

## 2020-10-29 ENCOUNTER — Ambulatory Visit: Payer: Self-pay | Admitting: *Deleted

## 2020-10-29 NOTE — Telephone Encounter (Signed)
Unable to lvm to make this apt/  

## 2020-10-29 NOTE — Chronic Care Management (AMB) (Signed)
Chronic Care Management Pharmacy Assistant   Name: Rick [name]  MRN: [id number] DOB: 05/28/1941  Reason for Encounter: Disease State/General Adherence Call   Recent office visits:  09/30/2020 OV PCP Dr. Wynetta Emery; treated with augmentin for cat bite  Recent consult visits:  [place] [facility] visits:  Medication Reconciliation was completed by comparing discharge summary, patient's EMR and Pharmacy list, and upon discussion with patient.  ED visit on 09/30/2020 due to cat bite. Discharged from [facility] the same day.    No medication changes indicated.  Medications that remain the same after Hospital Discharge:??  -All other medications will remain the same.    Medications: Outpatient Encounter Medications as of 10/29/2020  Medication Sig  . amoxicillin-clavulanate (AUGMENTIN) 875-125 MG tablet Take 1 tablet by mouth 2 (two) times daily.  . BD PEN NEEDLE NANO U/F 32G X 4 MM MISC USE 1 TIME DAILY AS DIRECTED.  [name] Blood Glucose Monitoring Suppl (ONE TOUCH ULTRA SYSTEM KIT) w/Device KIT 1 kit by Does not apply route once.  . carbidopa-levodopa (SINEMET IR) 25-100 MG tablet Take 1 tablet by mouth 4 (four) times daily.   . carvedilol (COREG) 6.25 MG tablet Take 1 tablet (6.25 mg total) by mouth 2 (two) times daily with a meal.  . diclofenac sodium (VOLTAREN) 1 % GEL Apply 2 g topically 4 (four) times daily. (Patient taking differently: Apply 2 g topically 4 (four) times daily as needed (lower back pain.).)  . enalapril (VASOTEC) 10 MG tablet Take 1 tablet (10 mg total) by mouth daily.  . fluticasone (FLONASE) 50 MCG/ACT nasal spray PLACE 2 SPRAYS INTO BOTH NOSTRILS 2 TIMES DAILY  . gabapentin (NEURONTIN) 300 MG capsule Take 2 capsules (600 mg total) by mouth 2 (two) times daily. (Patient taking differently: Take 600 mg by mouth 2 (two) times daily. Taking as needed)  . hydrochlorothiazide (HYDRODIURIL) 25 MG tablet TAKE ONE (1) TABLET EACH DAY (Patient taking  differently: TAKE ONE (1) TABLET EACH DAY taking as needed)  . Insulin Glargine (BASAGLAR KWIKPEN) 100 UNIT/ML Inject 15 Units into the skin daily.  . Insulin Pen Needle (ULTICARE PEN NEEDLES) 29G X 12MM MISC USE AS DIRECTED  . Lancets (ONETOUCH ULTRASOFT) lancets TEST BLOOD SUGAR THREE TIMES DAILY  . lovastatin (MEVACOR) 40 MG tablet Take 1 tablet (40 mg total) by mouth daily.  . Menthol, Topical Analgesic, (BENGAY EX) Apply 1 application topically 3 (three) times daily as needed (lower back pain.).  [name] metFORMIN (GLUCOPHAGE XR) 500 MG 24 hr tablet Take 2 tablets (1,000 mg total) by mouth in the morning and at bedtime.  . mirtazapine (REMERON) 15 MG tablet Take 1 tablet (15 mg total) by mouth at bedtime. (Patient not taking: No sig reported)  . ONETOUCH ULTRA test strip TEST BLOOD SUGAR THREE TIMES DAILY.  . silodosin (RAPAFLO) 8 MG CAPS capsule Take 1 capsule (8 mg total) by mouth daily with breakfast. (Patient not taking: No sig reported)  . traZODone (DESYREL) 100 MG tablet Take 1 tablet (100 mg total) by mouth at bedtime. (Patient taking differently: Take 100 mg by mouth at bedtime. Takes 1/2 tablet)  . valACYclovir (VALTREX) 1000 MG tablet Take 1 tablet (1,000 mg total) by mouth daily as needed.   No facility-administered encounter medications on file as of 10/29/2020.    Have you had any problems recently with your health? Patient states he bruised his ribs today while trying to cut wood with a hack saw. Patient states he fell and  bruised his ribs on the right side.  Patient states he called to inform Dr. Wynetta Emery that he fell today. Patient states he doesn't want to go in the office for an evaluation as his ribs are bruised and he doesn't have the gas to get there.  Have you had any problems with your pharmacy? Patient states he has not had any problems with his pharmacy.  What issues or side effects are you having with your medications? Patient states he is not currently having any  issues or side effects from any of his medications.  What would you like me to pass along to Birdena Crandall, CPP for her to help you with?  Patient states he doesn't have anything for me to pass along at this time.  What can we do to take care of you better? Patient states he is doing well at this time.  Future Appointments  Date Time Provider [place]  11/17/2020  1:00 PM [facility]    Patient was reminded of his follow up appointment with Birdena Crandall, CPP in 11/17/2020 at 1:00 pm.  Star Rating Drugs: Enalapril Maleate 10 mg last filled 08/31/2020 90 DS Lovastatin 40 mg last filled 08/31/2020 90 DS Metformin 500 mg last filled 09/27/2020 30 DS  April D Calhoun, [place] Pharmacist Assistant [phone]

## 2020-10-29 NOTE — Telephone Encounter (Signed)
Pt called in c/o bruised ribs on his right side from a fall a few minutes ago.   He denies hitting his head or any other injuries.   He hit his ribs on the side of a 5 gallon bucket.   He is having some pain but most of the pain is when he takes a deep breath.  "I've had broken and bruised ribs before"   "I can tell they are not broken".   I had him look at his side in a mirror.   He denies any evidence of bruising, cuts, bleeding or scrapes.   "It just hurts inside".  I talked to him about taking Tylenol or ibuprofen for pain and to follow the label instructions.  I let him know he needs to have a chest x-ray to determine the amount of damage or injury that has occurred.   "I'm not going to an emergency room with all that covid".   "If I get covid it would kill me especially with this rib injury".    He asked what to do over the weekend.  Since he is refusing the ED I let him know he can go to an urgent care that x rays can be done there.   "I didn't know they could do x rays at urgent care".   "That would be better than going to the hospital if I need to go over the weekend".   "I plan to stay home and stay away from all those covid people".    "That stuff will kill me".  I let him know I would let Dr. Wynetta Emery know what is going on with him.   I also encouraged him to take several deep breaths an hour because of the risk of pneumonia setting.   He was agreeable to doing that as much as the pain will allow.  I have sent my notes to [facility] for Dr. Jinny Blossom Johnson's information and his refusal to go get x rays done.   I sent it high priority.    Reason for Disposition . [1] Can't take a deep breath BUT [2] no respiratory distress  Answer Assessment - Initial Assessment Questions 1. MECHANISM: "How did the injury happen?"     I've bruised some ribs.   3-4 ribs are bruised  Badly.   It's catching my breath with the pain. 2. ONSET: "When did the injury happen?" (Minutes or hours  ago)     I fell and hit my ribs on a 5 gallon bucket.    3. LOCATION: "Where on the chest is the injury located?"     I can't deep breath.   I've had bruised ribs before.  It's on my right side.  It's been several years ago I had bruised ribs. 4. APPEARANCE: "What does the injury look like?"     Normal 5. BLEEDING: "Is there any bleeding now? If Yes, ask: How long has it been bleeding?"     No 6. SEVERITY: "Any difficulty with breathing?"     With a deep breath 7. SIZE: For cuts, bruises, or swelling, ask: "How large is it?" (e.g., inches or centimeters)     No 8. PAIN: "Is there pain?" If Yes, ask: "How bad is the pain?"   (e.g., Scale 1-10; or mild, moderate, severe)     Hurts a little now but deep breathing really hurts 9. TETANUS: For any breaks in the skin, ask: "When was the last tetanus booster?"  N/A 10. PREGNANCY: "Is there any chance you are pregnant?" "When was your last menstrual period?"       N/A  Protocols used: CHEST INJURY-A-AH

## 2020-11-01 NOTE — Telephone Encounter (Signed)
Unable to lvm to make this apt/  

## 2020-11-02 ENCOUNTER — Emergency Department: Payer: Medicare Other

## 2020-11-02 ENCOUNTER — Emergency Department
Admission: EM | Admit: 2020-11-02 | Discharge: 2020-11-02 | Disposition: A | Discharge status: Home / Self Care | Payer: Medicare Other | Attending: Emergency Medicine | Admitting: Emergency Medicine

## 2020-11-02 ENCOUNTER — Other Ambulatory Visit: Payer: Self-pay

## 2020-11-02 ENCOUNTER — Ambulatory Visit: Payer: Self-pay | Admitting: *Deleted

## 2020-11-02 DIAGNOSIS — Z794 Long term (current) use of insulin: Secondary | ICD-10-CM | POA: Diagnosis not present

## 2020-11-02 DIAGNOSIS — Z79899 Other long term (current) drug therapy: Secondary | ICD-10-CM | POA: Insufficient documentation

## 2020-11-02 DIAGNOSIS — R079 Chest pain, unspecified: Secondary | ICD-10-CM | POA: Diagnosis not present

## 2020-11-02 DIAGNOSIS — E119 Type 2 diabetes mellitus without complications: Secondary | ICD-10-CM | POA: Diagnosis not present

## 2020-11-02 DIAGNOSIS — S2241XA Multiple fractures of ribs, right side, initial encounter for closed fracture: Secondary | ICD-10-CM | POA: Insufficient documentation

## 2020-11-02 DIAGNOSIS — Z7984 Long term (current) use of oral hypoglycemic drugs: Secondary | ICD-10-CM | POA: Insufficient documentation

## 2020-11-02 DIAGNOSIS — T1490XA Injury, unspecified, initial encounter: Secondary | ICD-10-CM

## 2020-11-02 DIAGNOSIS — Z87891 Personal history of nicotine dependence: Secondary | ICD-10-CM | POA: Insufficient documentation

## 2020-11-02 DIAGNOSIS — W010XXA Fall on same level from slipping, tripping and stumbling without subsequent striking against object, initial encounter: Secondary | ICD-10-CM | POA: Diagnosis not present

## 2020-11-02 DIAGNOSIS — K5730 Diverticulosis of large intestine without perforation or abscess without bleeding: Secondary | ICD-10-CM | POA: Diagnosis not present

## 2020-11-02 DIAGNOSIS — I10 Essential (primary) hypertension: Secondary | ICD-10-CM | POA: Diagnosis not present

## 2020-11-02 DIAGNOSIS — S2249XA Multiple fractures of ribs, unspecified side, initial encounter for closed fracture: Secondary | ICD-10-CM

## 2020-11-02 DIAGNOSIS — I517 Cardiomegaly: Secondary | ICD-10-CM | POA: Diagnosis not present

## 2020-11-02 DIAGNOSIS — R0789 Other chest pain: Secondary | ICD-10-CM | POA: Diagnosis present

## 2020-11-02 DIAGNOSIS — J90 Pleural effusion, not elsewhere classified: Secondary | ICD-10-CM | POA: Diagnosis not present

## 2020-11-02 DIAGNOSIS — Z8551 Personal history of malignant neoplasm of bladder: Secondary | ICD-10-CM | POA: Insufficient documentation

## 2020-11-02 DIAGNOSIS — J9811 Atelectasis: Secondary | ICD-10-CM | POA: Diagnosis not present

## 2020-11-02 LAB — CBC WITH DIFFERENTIAL/PLATELET
Abs Immature Granulocytes: 0.05 10*3/uL (ref 0.00–0.07)
Basophils Absolute: 0.1 10*3/uL (ref 0.0–0.1)
Basophils Relative: 1 %
Eosinophils Absolute: 0.1 10*3/uL (ref 0.0–0.5)
Eosinophils Relative: 1 %
HCT: 39.4 % (ref 39.0–52.0)
Hemoglobin: 12.7 g/dL — ABNORMAL LOW (ref 13.0–17.0)
Immature Granulocytes: 1 %
Lymphocytes Relative: 17 %
Lymphs Abs: 1.6 10*3/uL (ref 0.7–4.0)
MCH: 29.4 pg (ref 26.0–34.0)
MCHC: 32.2 g/dL (ref 30.0–36.0)
MCV: 91.2 fL (ref 80.0–100.0)
Monocytes Absolute: 0.8 10*3/uL (ref 0.1–1.0)
Monocytes Relative: 9 %
Neutro Abs: 6.5 10*3/uL (ref 1.7–7.7)
Neutrophils Relative %: 71 %
Platelets: 203 10*3/uL (ref 150–400)
RBC: 4.32 MIL/uL (ref 4.22–5.81)
RDW: 13.3 % (ref 11.5–15.5)
WBC: 9.2 10*3/uL (ref 4.0–10.5)
nRBC: 0 % (ref 0.0–0.2)

## 2020-11-02 LAB — BASIC METABOLIC PANEL
Anion gap: 7 (ref 5–15)
BUN: 16 mg/dL (ref 8–23)
CO2: 25 mmol/L (ref 22–32)
Calcium: 9.3 mg/dL (ref 8.9–10.3)
Chloride: 108 mmol/L (ref 98–111)
Creatinine, Ser: 0.99 mg/dL (ref 0.61–1.24)
GFR, Estimated: >60 mL/min (ref >60)
Glucose, Bld: 96 mg/dL (ref 70–99)
Potassium: 3.8 mmol/L (ref 3.5–5.1)
Sodium: 140 mmol/L (ref 135–145)

## 2020-11-02 MED ORDER — LIDOCAINE 5 % EX PTCH
2.0000 | MEDICATED_PATCH | CUTANEOUS | Status: DC
  Administered 2020-11-02: 2 via TRANSDERMAL
  Filled 2020-11-02: qty 2

## 2020-11-02 MED ORDER — OXYCODONE-ACETAMINOPHEN 5-325 MG PO TABS: 1.0000 | ORAL_TABLET | Freq: Three times a day (TID) | ORAL | 0 refills | Status: DC | PRN

## 2020-11-02 MED ORDER — HYDROMORPHONE HCL 1 MG/ML IJ SOLN
0.5000 mg | Freq: Once | INTRAMUSCULAR | Status: AC
Start: 2020-11-02 — End: 2020-11-02
  Administered 2020-11-02: 0.5 mg via INTRAMUSCULAR
  Filled 2020-11-02: qty 1

## 2020-11-02 MED ORDER — IOHEXOL 350 MG/ML SOLN
75.0000 mL | Freq: Once | INTRAVENOUS | Status: AC | PRN
  Administered 2020-11-02: 75 mL via INTRAVENOUS
  Filled 2020-11-02: qty 75

## 2020-11-02 MED ORDER — LIDOCAINE 5 % EX PTCH: 1.0000 | MEDICATED_PATCH | Freq: Two times a day (BID) | CUTANEOUS | 0 refills | Status: AC

## 2020-11-02 MED ORDER — KETOROLAC TROMETHAMINE 30 MG/ML IJ SOLN
30.0000 mg | Freq: Once | INTRAMUSCULAR | Status: AC
  Administered 2020-11-02: 30 mg via INTRAMUSCULAR
  Filled 2020-11-02: qty 1

## 2020-11-02 NOTE — Telephone Encounter (Signed)
Pt called stating he is having rib pain that started when he "broke his rib" on 10/28/20; the pt is having a hard time breathing and rates his pain 8-9 out of 10; the pt says he hears "crackling and popping"; recommendations made per nurse triage protocol; the pt says he does not have a ride to the ED; pt declined having this triage RN call EMS;t he declined and says he will call his first wife take him to the ED; also see nurse triage note dated 10/29/20; the pt is seen by Dr Jinny Blossom, [facility]; will route to office for notification.  Reason for Disposition . SEVERE chest pain  Answer Assessment - Initial Assessment Questions 1. MECHANISM: "How did the injury happen?"     Pt was sawing wood 2. ONSET: "When did the injury happen?" (Minutes or hours ago)    10/28/20 3. LOCATION: "Where on the chest is the injury located?"      4. APPEARANCE: "What does the injury look like?"      5. BLEEDING: "Is there any bleeding now? If Yes, ask: How long has it been bleeding?"      6. SEVERITY: "Any difficulty with breathing?"     Yes  7. SIZE: For cuts, bruises, or swelling, ask: "How large is it?" (e.g., inches or centimeters)   8. PAIN: "Is there pain?" If Yes, ask: "How bad is the pain?"   (e.g., Scale 1-10; or mild, moderate, severe)     8-9 out of 10 9. TETANUS: For any breaks in the skin, ask: "When was the last tetanus booster?"     10. PREGNANCY: "Is there any chance you are pregnant?" "When was your last menstrual period?"       n/a  Protocols used: CHEST INJURY-A-AH

## 2020-11-02 NOTE — ED Triage Notes (Signed)
Pt state Thursday slipped on wet floor and fell. State R sided rib pain. Thinks he broke some ribs.

## 2020-11-02 NOTE — Telephone Encounter (Signed)
Called pt he states that his ex wife is on the way now to take him to the hospital

## 2020-11-02 NOTE — ED Notes (Signed)
See triage note  Presents s/p fall  Slipped on wet floor having pain to right rib area  States he has broken ribs there in the past

## 2020-11-02 NOTE — ED Provider Notes (Signed)
[facility] Emergency Department Provider Note   ____________________________________________   Event Date/Time   First MD Initiated Contact with Patient 11/02/20 1047     (approximate)  I have reviewed the triage vital signs and the nursing notes.   HISTORY  Chief Complaint Fall    HPI Rick [name] is a 80 y.o. male patient presents with right lateral rib pain secondary to a slip and fall 4 days ago.  Patient state he thinks he broke his ribs.  Patient denies dyspnea.  Patient stated pain increased with deep inspirations.  Rates pain at a 9/10.  Described pain as "achy".         Past Medical History:  Diagnosis Date  . Anxiety   . Bipolar disorder ([place])   . Bladder cancer ([place])   . BPH (benign prostatic hypertrophy)   . Chest pain, atypical 06/23/2015  . Closed trimalleolar fracture of left ankle 10/01/2015  . Cystitis    hx of   . Depression   . Diabetes mellitus without complication ([place])    type 2   . Difficult or painful urination 10/31/2012  . Dysrhythmia   . Ear problem 03/24/2015  . Gangrenous appendicitis   . H/O urinary disorder 03/27/2013  . Herpes genitalis in men   . Hyperlipidemia   . Hypertension   . Insomnia   . Kidney stones   . Left hand pain 05/18/2015  . Mass of arm 02/10/2015  . Peripheral neuropathy   . Skin cancer   . UD (urethral discharge) 10/31/2012  . Urinary tract infection    hx of     Patient Active Problem List   Diagnosis Date Noted  . Tremor 05/28/2018  . BPH (benign prostatic hyperplasia) 03/22/2018  . Recurrent UTI 11/29/2017  . Osteoarthritis of multiple joints 08/23/2017  . PVD (peripheral vascular disease) ([place]) 05/14/2017  . Hard of hearing 12/07/2016  . History of tobacco abuse 12/07/2016  . History of ulcerative colitis 12/07/2016  . Ventral hernia 12/21/2015  . History of colon cancer, no staging 12/21/2015  . History of bladder cancer 08/05/2015  . Benign fibroma of prostate  08/05/2015  . History of alcoholism ([place]) 08/05/2015  . History of migraine headaches 08/05/2015  . Calculus of kidney 08/05/2015  . Essential hypertension 05/11/2015  . Hyperlipidemia 05/11/2015  . Affective bipolar disorder ([place]) 03/24/2015  . Diabetic peripheral neuropathy associated with type 2 diabetes mellitus ([place]) 03/24/2015  . Insomnia 03/24/2015  . History of abdominal hernia 02/10/2015  . Anxiety and depression 02/10/2015  . Generalized anxiety disorder 05/18/2014  . Chronic prostatitis 06/12/2013  . Herpesviral infection of penis 11/07/2012  . Incomplete bladder emptying 11/07/2012  . Incisional hernia 11/07/2012  . Balanoposthitis 10/31/2012  . Benign localized prostatic hyperplasia with lower urinary tract symptoms (LUTS) 10/31/2012  . ED (erectile dysfunction) of organic origin 10/31/2012  . History of cancer of ureter 10/31/2012    Past Surgical History:  Procedure Laterality Date  . APPENDECTOMY     RUPTURED  . BLADDER SURGERY    . CHOLECYSTECTOMY    . COLOSTOMY     AND LATER CLOSURE  . CYSTOSCOPY/URETEROSCOPY/HOLMIUM LASER/STENT PLACEMENT Left 03/22/2018   Procedure: CYSTOSCOPY/LEFT URETEROSCOPY/LEFT RETROGRADE PYELOGRAM;  Surgeon: Lucas Mallow, MD;  Location: [facility];  Service: Urology;  Laterality: Left;  . EPIDIDYMECTOMY N/A 01/03/2019   Procedure: RIGHT EPIDIDYMAL CYST REMOVAL VERSES EPIDIDMECTOMY;  Surgeon: Lucas Mallow, MD;  Location: [facility];  Service: Urology;  Laterality: N/A;  .  FRACTURE SURGERY    . HERNIA REPAIR    . INNER EAR SURGERY Left   . ORIF ANKLE FRACTURE Left 09/27/2015   Procedure: OPEN REDUCTION INTERNAL FIXATION (ORIF) ANKLE FRACTURE;  Surgeon: Corky Mull, MD;  Location: [facility] ORS;  Service: Orthopedics;  Laterality: Left;  . SKIN CANCER EXCISION    . TRANSURETHRAL RESECTION OF PROSTATE N/A 03/22/2018   Procedure: TRANSURETHRAL RESECTION OF THE PROSTATE (TURP);  Surgeon: Lucas Mallow, MD;  Location: [facility];  Service:  Urology;  Laterality: N/A;  . VENTRAL HERNIA REPAIR N/A 01/14/2016   Procedure: HERNIA REPAIR VENTRAL ADULT;  Surgeon: Leonie Green, MD;  Location: [facility] ORS;  Service: General;  Laterality: N/A;    Prior to Admission medications   Medication Sig Start Date End Date Taking? Authorizing Provider  lidocaine (LIDODERM) 5 % Place 1 patch onto the skin every 12 (twelve) hours. Remove & Discard patch within 12 hours or as directed by MD 11/02/20 11/02/21 Yes Sable Feil, PA-C  oxyCODONE-acetaminophen (PERCOCET) 5-325 MG tablet Take 1 tablet by mouth every 8 (eight) hours as needed for severe pain. 11/02/20 11/02/21 Yes Sable Feil, PA-C  BD PEN NEEDLE NANO U/F 32G X 4 MM MISC USE 1 TIME DAILY AS DIRECTED. 08/26/20   Johnson, Megan P, DO  Blood Glucose Monitoring Suppl (ONE TOUCH ULTRA SYSTEM KIT) w/Device KIT 1 kit by Does not apply route once. 02/11/16   Kathrine Haddock, NP  carbidopa-levodopa (SINEMET IR) 25-100 MG tablet Take 1 tablet by mouth 4 (four) times daily.  10/11/18 12/24/19  [provider]  carvedilol (COREG) 6.25 MG tablet Take 1 tablet (6.25 mg total) by mouth 2 (two) times daily with a meal. 05/27/20 05/27/21  Noemi Chapel A, NP  diclofenac sodium (VOLTAREN) 1 % GEL Apply 2 g topically 4 (four) times daily. Patient taking differently: Apply 2 g topically 4 (four) times daily as needed (lower back pain.). 08/20/17   Volney American, PA-C  enalapril (VASOTEC) 10 MG tablet Take 1 tablet (10 mg total) by mouth daily. 05/27/20   Eulogio Bear, NP  fluticasone (FLONASE) 50 MCG/ACT nasal spray PLACE 2 SPRAYS INTO BOTH NOSTRILS 2 TIMES DAILY 07/24/20   Park Liter P, DO  gabapentin (NEURONTIN) 300 MG capsule Take 2 capsules (600 mg total) by mouth 2 (two) times daily. Patient taking differently: Take 600 mg by mouth 2 (two) times daily. Taking as needed 05/27/20   Eulogio Bear, NP  hydrochlorothiazide (HYDRODIURIL) 25 MG tablet TAKE ONE (1) TABLET EACH  DAY Patient taking differently: TAKE ONE (1) TABLET EACH DAY taking as needed 05/27/20   Eulogio Bear, NP  Insulin Glargine (BASAGLAR KWIKPEN) 100 UNIT/ML Inject 15 Units into the skin daily. 05/27/20   Eulogio Bear, NP  Insulin Pen Needle (ULTICARE PEN NEEDLES) 29G X 12MM MISC USE AS DIRECTED 08/05/19   Johnson, Megan P, DO  Lancets [facility] ULTRASOFT) lancets TEST BLOOD SUGAR THREE TIMES DAILY 04/22/19   Wynetta Emery, Megan P, DO  lovastatin (MEVACOR) 40 MG tablet Take 1 tablet (40 mg total) by mouth daily. 05/27/20   Eulogio Bear, NP  Menthol, Topical Analgesic, (BENGAY EX) Apply 1 application topically 3 (three) times daily as needed (lower back pain.).    [provider]  metFORMIN (GLUCOPHAGE XR) 500 MG 24 hr tablet Take 2 tablets (1,000 mg total) by mouth in the morning and at bedtime. 09/30/20   Johnson, Megan P, DO  ONETOUCH ULTRA test strip TEST  BLOOD SUGAR THREE TIMES DAILY. 04/22/19   Johnson, Megan P, DO  traZODone (DESYREL) 100 MG tablet Take 1 tablet (100 mg total) by mouth at bedtime. Patient taking differently: Take 100 mg by mouth at bedtime. Takes 1/2 tablet 05/27/20   Eulogio Bear, NP  valACYclovir (VALTREX) 1000 MG tablet Take 1 tablet (1,000 mg total) by mouth daily as needed. 12/23/19   Park Liter P, DO    Allergies Blood-group specific substance, Haloperidol, and Sulfa antibiotics  Family History  Problem Relation Age of Onset  . Transient ischemic attack Mother   . Diabetes Father   . Cancer Brother     Social History Social History   Tobacco Use  . Smoking status: Former Smoker    Packs/day: 1.50    Years: 15.00    Pack years: 22.50    Types: Cigarettes    Start date: 08/14/1994    Quit date: 03/23/2005    Years since quitting: 15.6  . Smokeless tobacco: Former Systems developer    Types: Snuff, Sarina Ser    Quit date: 03/23/2010  Vaping Use  . Vaping Use: Never used  Substance Use Topics  . Alcohol use: No    Alcohol/week: 0.0 standard  drinks  . Drug use: No    Review of Systems Constitutional: No fever/chills Eyes: No visual changes. ENT: No sore throat. Cardiovascular: Denies chest pain. Respiratory: Denies shortness of breath. Gastrointestinal: No abdominal pain.  No nausea, no vomiting.  No diarrhea.  No constipation. Genitourinary: Negative for dysuria. Musculoskeletal: Right lateral rib pain. Skin: Negative for rash. Neurological: Negative for headaches, focal weakness or numbness. Psychiatric:  Anxiety, bipolar, and depression. Endocrine:  Diabetes, hyperlipidemia, hypertension. Hematological/Lymphatic:  Allergic/Immunilogical: Haldol and sulfur antibiotics.  ____________________________________________   PHYSICAL EXAM:  VITAL SIGNS: ED Triage Vitals  Enc Vitals Group     BP 11/02/20 0942 (!) 155/106     Pulse Rate 11/02/20 0942 (!) 105     Resp 11/02/20 0942 18     Temp 11/02/20 0942 (!) 97.5 F (36.4 C)     Temp Source 11/02/20 0942 Oral     SpO2 11/02/20 0942 92 %     Weight 11/02/20 0943 165 lb (74.8 kg)     Height 11/02/20 0943 '5\' 8"'  (1.727 m)     Head Circumference --      Peak Flow --      Pain Score 11/02/20 0943 9     Pain Loc --      Pain Edu? --      Excl. in [place]? --     Constitutional: Alert and oriented. Well appearing and in no acute distress. Eyes: Conjunctivae are normal. PERRL. EOMI. Head: Atraumatic. Nose: No congestion/rhinnorhea. Mouth/Throat: Mucous membranes are moist.  Oropharynx non-erythematous. Neck: No stridor.  No cervical spine tenderness to palpation. Hematological/Lymphatic/Immunilogical: No cervical lymphadenopathy. Cardiovascular: Normal rate, regular rhythm. Grossly normal heart sounds.  Good peripheral circulation.  Abated blood pressure. Respiratory: Normal respiratory effort.  No retractions. Lungs CTAB. Gastrointestinal: Soft and nontender. No distention. No abdominal bruits. No CVA tenderness. Genitourinary: Deferred Musculoskeletal: No lower  extremity tenderness nor edema.  No joint effusions. Neurologic:  Normal speech and language. No gross focal neurologic deficits are appreciated. No gait instability. Skin:  Skin is warm, dry and intact. No rash noted.  Abrasion ecchymosis right lateral ribs. Psychiatric: Mood and affect are normal. Speech and behavior are normal.  ____________________________________________   LABS (all labs ordered are listed, but only abnormal results are displayed)  Labs Reviewed  CBC WITH DIFFERENTIAL/PLATELET - Abnormal; Notable for the following components:      Result Value   Hemoglobin 12.7 (*)    All other components within normal limits  BASIC METABOLIC PANEL   ____________________________________________  EKG   ____________________________________________  RADIOLOGY I, Sable Feil, personally viewed and evaluated these images (plain radiographs) as part of my medical decision making, as well as reviewing the written report by the radiologist.  ED MD interpretation: Multiple right lateral rib fractures. Official radiology report(s): DG Ribs Unilateral W/Chest Right  Result Date: 11/02/2020 CLINICAL DATA:  80 year old male status post fall 5 days ago with continued right lower rib pain. EXAM: RIGHT RIBS AND CHEST - 3+ VIEW COMPARISON:  Chest radiographs 03/30/2005. FINDINGS: PA view is mildly rotated to the left today. There are displaced fractures of the right anterior 6th through 10th ribs (image 3). And some of those ribs appear to be fractured in 2 places (8th and 9th). No pneumothorax identified but there is a small to moderate right pleural effusion. Mediastinal contours remain stable. The left lung appears stable. Underlying normal bone mineralization. Other visible osseous structures appear intact. Negative visible bowel gas pattern. No other acute osseous injury identified. IMPRESSION: 1. Displaced fractures of the right 6th through 10th ribs, with some flail segment present. 2.  Associated small to moderate right pleural effusion. No pneumothorax identified. Electronically Signed   By: Genevie Ann M.D.   On: 11/02/2020 10:37   CT ANGIO CHEST AORTA W/CM &/OR WO/CM  Result Date: 11/02/2020 CLINICAL DATA:  Fall on Thursday with rib pain. Follow-up of equivocal abnormality involving the ascending aorta. EXAM: CT ANGIOGRAPHY CHEST WITH CONTRAST TECHNIQUE: Multidetector CT imaging of the chest was performed using the standard protocol during bolus administration of intravenous contrast. Multiplanar CT image reconstructions and MIPs were obtained to evaluate the vascular anatomy. CONTRAST:  1m OMNIPAQUE IOHEXOL 350 MG/ML SOLN COMPARISON:  CT of earlier today at 11:26 a.m. FINDINGS: Cardiovascular:  No intramural hematoma on noncontrast imaging. Normal aortic caliber. Aortic atherosclerosis. No evidence of aortic laceration or dissection. Periaortic low-density fluid including on 42/6 is unchanged and within a pericardial recess. Tortuous thoracic aorta. Mild cardiomegaly, without pericardial effusion. Multivessel coronary artery atherosclerosis. No central pulmonary embolism, on this non-dedicated study. Pulmonary artery enlargement, outflow tract 3.63 cm. Mediastinum/Nodes: No mediastinal or definite hilar adenopathy, given limitations of unenhanced CT. Lungs/Pleura: Small right pleural effusion is similar. Minimal left-sided pleural fluid. No pneumothorax.  Mild centrilobular emphysema. Thickening of and a calcified granuloma along the inferior aspect of the right minor fissure. Right base dependent atelectasis is similar. Upper Abdomen: Pneumobilia. Caudate and lateral segment left liver lobe prominence. Normal imaged portions of the spleen, stomach, right adrenal gland, right kidney. Upper pole left renal low-density lesions are too small to characterize. Mild left adrenal nodularity. Musculoskeletal: Acute anterior right sixth through tenth rib fractures are again identified. Review of the  MIP images confirms the above findings. IMPRESSION: 1. The periaortic fluid described on the prior exam is within a pericardial recess. No evidence of aortic injury or mediastinal hematoma. 2. Lower right rib fractures with small volume pleural fluid, possibly hemothorax, similar. 3. No pneumothorax or other acute complication. 4. Hepatic morphology suspicious for cirrhosis. Correlate with risk factors. 5. Aortic atherosclerosis (ICD10-I70.0), coronary artery atherosclerosis and emphysema (ICD10-J43.9). 6. Pulmonary artery enlargement suggests pulmonary arterial hypertension. Electronically Signed   By: KAbigail MiyamotoM.D.   On: 11/02/2020 14:12   CT CHEST ABDOMEN PELVIS WO  CONTRAST  Result Date: 11/02/2020 CLINICAL DATA:  Recent fall several days ago with multiple right-sided rib fractures. EXAM: CT CHEST, ABDOMEN AND PELVIS WITHOUT CONTRAST TECHNIQUE: Multidetector CT imaging of the chest, abdomen and pelvis was performed following the standard protocol without IV contrast. COMPARISON:  Chest x-ray from earlier in the same day, CT from 02/10/2020. FINDINGS: CT CHEST FINDINGS Cardiovascular: Somewhat limited due to lack of IV contrast. Aortic calcifications are noted. Some fluid attenuation is noted surrounding the aorta likely related to the superior pericardial reflection and some pericardial fluid. This is not increased in attenuation to suggest acute mediastinal hematoma and is less dense than that in the cardiac blood pool. Pulmonary artery as visualized is within normal limits. Mild coronary calcifications are seen. Mediastinum/Nodes: Thoracic inlet is within normal limits. No sizable hilar or mediastinal adenopathy is noted. No mediastinal hematoma is seen. The esophagus as visualized is within normal limits. Lungs/Pleura: The lungs are well aerated bilaterally. Small right-sided pleural effusion is noted with underlying lower lobe atelectasis related to the known rib fractures. No definitive pneumothorax  is seen. Calcified granulomas are noted. Musculoskeletal: Degenerative changes of the thoracic spine are noted. No compression deformities are seen. No sternal fracture is noted. Multiple right rib fractures are again noted to include the sixth through tenth ribs similar to that seen on recent plain film evaluation. No left-sided rib fractures are noted. CT ABDOMEN PELVIS FINDINGS Hepatobiliary: Pneumobilia is noted consistent with prior intervention. The liver is otherwise within normal limits. Gallbladder has been surgically removed. Pancreas: Unremarkable. No pancreatic ductal dilatation or surrounding inflammatory changes. Spleen: Normal in size without focal abnormality. Adrenals/Urinary Tract: Adrenal glands are unremarkable. Kidneys demonstrate mild perinephric stranding as well as nonobstructing lower pole right renal stone. Right renal cyst is noted measuring 3.1 cm. This is stable from a prior CT from 2021. No ureteral calculi are seen. The bladder is partially distended. Stomach/Bowel: Scattered diverticular change of the colon is noted. No evidence of diverticulitis is seen. The appendix has been surgically removed. Small bowel is nondistended. Changes of prior anterior ventral hernia repair are seen although a recurrent small hernia is noted along the inferior aspect of the repair containing a few loops of small bowel. No incarceration is seen. The stomach is unremarkable. Vascular/Lymphatic: Aortic atherosclerosis. No enlarged abdominal or pelvic lymph nodes. Reproductive: Prostate is unremarkable. Other: Small ventral hernia is noted along the inferior margin of the prior repair containing a few loops of small bowel although no incarceration is noted. No free fluid is noted. Musculoskeletal: No acute or significant osseous findings. IMPRESSION: Multiple right rib fractures with associated atelectasis in the right lower lobe and small right pleural effusion. No pneumothorax is noted at this time. Fluid  is noted along the ascending aorta which is less dense than that seen in the cardiac blood pool and likely representing fluid in the superior pericardial reflection. No mediastinal hematoma is noted. If there is any clinical concern for aortic injury, a CTA of the chest is recommended for further evaluation. Diverticulosis without diverticulitis. Recurrent ventral hernia along the inferior aspect of prior hernia repair with a few small bowel loops within. No incarceration is noted. These results were called by telephone at the time of interpretation on 11/02/2020 at 12:12 pm to provider [facility] [place] , who verbally acknowledged these results. Electronically Signed   By: Inez Catalina M.D.   On: 11/02/2020 12:10    ____________________________________________   PROCEDURES  Procedure(s) performed (including Critical Care):  Procedures   ____________________________________________   INITIAL IMPRESSION / ASSESSMENT AND PLAN / ED COURSE  As part of my medical decision making, I reviewed the following data within the [place]         Patient presents with multiple right lateral rib fractures 6 through 10.  Patient had further evaluation with CT of the chest and angiogram.  Discussed sequela rib fracture with patient.  Patient given discharge care instructions and advised take medication as directed.  Patient advised follow-up PCP for continued care.  Return to ED if condition worsens.      ____________________________________________   FINAL CLINICAL IMPRESSION(S) / ED DIAGNOSES  Final diagnoses:  Multiple rib fractures involving four or more ribs     ED Discharge Orders         Ordered    lidocaine (LIDODERM) 5 %  Every 12 hours        11/02/20 1459    oxyCODONE-acetaminophen (PERCOCET) 5-325 MG tablet  Every 8 hours PRN        11/02/20 1459          *Please note:  Rick [name] was evaluated in Emergency Department on 11/02/2020 for the symptoms  described in the history of present illness. He was evaluated in the context of the global COVID-19 pandemic, which necessitated consideration that the patient might be at risk for infection with the SARS-CoV-2 virus that causes COVID-19. Institutional protocols and algorithms that pertain to the evaluation of patients at risk for COVID-19 are in a state of rapid change based on information released by regulatory bodies including the CDC and federal and state organizations. These policies and algorithms were followed during the patient's care in the ED.  Some ED evaluations and interventions may be delayed as a result of limited staffing during and the pandemic.*   Note:  This document was prepared using Dragon voice recognition software and may include unintentional dictation errors.    Sable Feil, PA-C 11/02/20 1506    Naaman Plummer, MD 11/03/20 [phone]

## 2020-11-02 NOTE — Discharge Instructions (Addendum)
Follow discharge care instruction take medication as directed. °

## 2020-11-02 NOTE — Telephone Encounter (Signed)
If he can't get to the ER he will need to be seen

## 2020-11-17 ENCOUNTER — Telehealth: Payer: Self-pay | Admitting: Pharmacist

## 2020-11-17 ENCOUNTER — Ambulatory Visit: Payer: Self-pay

## 2020-11-17 NOTE — Telephone Encounter (Signed)
  Chronic Care Management   Outreach Note  11/17/2020 Name: XAIDYN KEPNER MRN: [id number] DOB: 04-Feb-1941  Referred by: [name], DO Reason for referral : Chronic care management   Outreached patient for follow-up visit at scheduled time. Patient requested call back at a later date as he was "really busy". I will reoutreach the patient with next 14 days.   Junita Push. Kenton Kingfisher PharmD, [facility] [phone]

## 2020-11-17 NOTE — Progress Notes (Incomplete)
Chronic Care Management Pharmacy Note  11/17/2020 Name:  Rick [name] MRN:  [id number] DOB:  05-30-1941  Subjective: Rick [name] is an 80 y.o. year old male who is a primary patient of [name], DO.  The CCM team was consulted for assistance with disease management and care coordination needs.    {CCMTELEPHONEFACETOFACE:[id number]} for follow up visit in response to provider referral for pharmacy case management and/or care coordination services.   Consent to Services:  The patient was given information about Chronic Care Management services, agreed to services, and gave verbal consent prior to initiation of services.  Please see initial visit note for detailed documentation.   Patient Care Team: [name], DO as PCP - General (Family Medicine) Poggi, Marshall Cork, MD as Consulting Physician (Orthopedic Surgery) Lucas Mallow, MD as Consulting Physician (Urology)  Recent office visits: 2/17/22Wynetta Emery (PCP)- cat bite, cellulitis- Augmentin 875 mg bid x 10, Tetanus diptheria vaccine, vaccine referral to [facility] urgent  Recent consult visits: 09/30/20-[facility] - cellulitis,  ? Tenosynovitis form cat bite sent to ED for IV abx  Hospital visits: 11/02/20- ED [facility] -([facility]) rib pain 8-9/10 form fall on 5 gal bucket when cutting wood 3/17, multiple right rib fractures 6-10,small rt pleural effusion percocet 5 q8h prn #20, lidocaine patch 09/30/20- ED [facility](Siadecki)-cat bite Zosyn IV , D?C with augmentin  Objective:  Lab Results  Component Value Date   CREATININE 0.99 11/02/2020   BUN 16 11/02/2020   GFRNONAA >60 11/02/2020   GFRAA 89 05/27/2020   NA 140 11/02/2020   K 3.8 11/02/2020   CALCIUM 9.3 11/02/2020   CO2 25 11/02/2020   GLUCOSE 96 11/02/2020    Lab Results  Component Value Date/Time   HGBA1C 5.4 05/27/2020 10:30 AM   HGBA1C 6.2 12/23/2019 03:13 PM   HGBA1C 6.3 01/05/2016 12:00 AM   MICROALBUR 10 12/23/2019 03:13 PM   MICROALBUR 10  07/08/2019 10:51 AM    Last diabetic Eye exam:  Lab Results  Component Value Date/Time   HMDIABEYEEXA No Retinopathy 01/23/2020 12:00 AM    Last diabetic Foot exam: No results found for: HMDIABFOOTEX   Lab Results  Component Value Date   CHOL 138 05/27/2020   HDL 50 05/27/2020   LDLCALC 73 05/27/2020   TRIG 77 05/27/2020   CHOLHDL 2.7 05/07/2018    Hepatic Function Latest Ref Rng & Units 12/23/2019 09/29/2019 07/08/2019  Total Protein 6.0 - 8.5 g/dL 6.8 7.8 7.1  Albumin 3.7 - 4.7 g/dL 4.7 4.7 4.7  AST 0 - 40 IU/L '18 21 18  ' ALT 0 - 44 IU/L '13 6 8  ' Alk Phosphatase 39 - 117 IU/L 62 57 70  Total Bilirubin 0.0 - 1.2 mg/dL 1.0 1.4(H) 0.7    Lab Results  Component Value Date/Time   TSH 2.150 07/08/2019 11:31 AM   TSH 1.540 12/11/2018 10:53 AM    CBC Latest Ref Rng & Units 11/02/2020 09/30/2020 12/23/2019  WBC 4.0 - 10.5 K/uL 9.2 10.4 7.3  Hemoglobin 13.0 - 17.0 g/dL 12.7(L) 11.6(L) 14.7  Hematocrit 39.0 - 52.0 % 39.4 35.1(L) 42.6  Platelets 150 - 400 K/uL 203 209 252    No results found for: VD25OH  Clinical ASCVD: Yes  The 10-year ASCVD risk score Mikey Bussing DC Jr., et al., 2013) is: 64.5%   Values used to calculate the score:     Age: 22 years     Sex: Male     Is Non-Hispanic African American: No  Diabetic: Yes     Tobacco smoker: No     Systolic Blood Pressure: 010 mmHg     Is BP treated: Yes     HDL Cholesterol: 50 mg/dL     Total Cholesterol: 138 mg/dL    Depression screen [facility] 2/9 12/26/2019 06/04/2019 01/02/2018  Decreased Interest 0 0 1  Down, Depressed, Hopeless '1 1 1  ' PHQ - 2 Score '1 1 2  ' Altered sleeping 0 - 3  Tired, decreased energy 2 - 1  Change in appetite 0 - 0  Feeling bad or failure about yourself  1 - 0  Trouble concentrating 2 - 0  Moving slowly or fidgety/restless 0 - 1  Suicidal thoughts 0 - 1  PHQ-9 Score 6 - 8  Difficult doing work/chores Not difficult at all - -  Some recent data might be hidden     ***Other: (CHADS2VASc if Afib, MMRC or  CAT for COPD, ACT, DEXA)  Social History   Tobacco Use  Smoking Status Former Smoker  . Packs/day: 1.50  . Years: 15.00  . Pack years: 22.50  . Types: Cigarettes  . Start date: 08/14/1994  . Quit date: 03/23/2005  . Years since quitting: 15.6  Smokeless Tobacco Former Systems developer  . Types: Snuff, Chew  . Quit date: 03/23/2010   BP Readings from Last 3 Encounters:  11/02/20 (!) 150/98  09/30/20 129/81  09/30/20 134/66   Pulse Readings from Last 3 Encounters:  11/02/20 98  09/30/20 80  09/30/20 94   Wt Readings from Last 3 Encounters:  11/02/20 165 lb (74.8 kg)  09/30/20 171 lb (77.6 kg)  09/30/20 172 lb 6.4 oz (78.2 kg)   BMI Readings from Last 3 Encounters:  11/02/20 25.09 kg/m  09/30/20 25.25 kg/m  09/30/20 26.21 kg/m    Assessment/Interventions: Review of patient past medical history, allergies, medications, health status, including review of consultants reports, laboratory and other test data, was performed as part of comprehensive evaluation and provision of chronic care management services.   SDOH:  (Social Determinants of Health) assessments and interventions performed: {yes/no:20286}  SDOH Screenings   Alcohol Screen: Not on file  Depression (PHQ2-9): Medium Risk  . PHQ-2 Score: 6  Financial Resource Strain: Not on file  Food Insecurity: Not on file  Housing: Not on file  Physical Activity: Not on file  Social Connections: Not on file  Stress: Not on file  Tobacco Use: Medium Risk  . Smoking Tobacco Use: Former Smoker  . Smokeless Tobacco Use: Former Soil scientist Needs: Not on file    [place]  Allergies  Allergen Reactions  . Blood-Group Specific Substance   . Haloperidol Other (See Comments)    Unknown  . Sulfa Antibiotics Other (See Comments)    Unknown    Medications Reviewed Today    Reviewed by Arlyce Harman, RN (Registered Nurse) on 11/02/20 at 1109  Med List Status: <None>  Medication Order Taking? Sig Documenting Provider  Last Dose Status Informant        Discontinued 11/02/20 1109 (Error)   BD PEN NEEDLE NANO U/F 32G X 4 MM MISC [id number]  USE 1 TIME DAILY AS DIRECTED. Johnson, Megan P, DO  Active   Blood Glucose Monitoring Suppl (ONE TOUCH ULTRA SYSTEM KIT) w/Device KIT [id number]  1 kit by Does not apply route once. Kathrine Haddock, NP  Active Self  carbidopa-levodopa (SINEMET IR) 25-100 MG tablet [id number]  Take 1 tablet by mouth 4 (four) times daily.  [provider]  Expired 12/24/19 2359 Self  carvedilol (COREG) 6.25 MG tablet [id number]  Take 1 tablet (6.25 mg total) by mouth 2 (two) times daily with a meal. Noemi Chapel A, NP  Active   diclofenac sodium (VOLTAREN) 1 % GEL [id number]  Apply 2 g topically 4 (four) times daily.  Patient taking differently: Apply 2 g topically 4 (four) times daily as needed (lower back pain.).   Volney American, [place]  Active   enalapril (VASOTEC) 10 MG tablet [id number]  Take 1 tablet (10 mg total) by mouth daily. Eulogio Bear, NP  Active   fluticasone [facility]) 50 MCG/ACT nasal spray [id number]  PLACE 2 SPRAYS INTO BOTH NOSTRILS 2 TIMES DAILY Johnson, Megan P, DO  Active   gabapentin (NEURONTIN) 300 MG capsule [id number]  Take 2 capsules (600 mg total) by mouth 2 (two) times daily.  Patient taking differently: Take 600 mg by mouth 2 (two) times daily. Taking as needed   Eulogio Bear, NP  Active   hydrochlorothiazide (HYDRODIURIL) 25 MG tablet [id number]  TAKE ONE (1) TABLET EACH DAY  Patient taking differently: TAKE ONE (1) TABLET EACH DAY taking as needed   Eulogio Bear, NP  Active   Insulin Glargine (BASAGLAR KWIKPEN) 100 UNIT/ML [id number]  Inject 15 Units into the skin daily. Eulogio Bear, NP  Active   Insulin Pen Needle (ULTICARE PEN NEEDLES) 29G X 12MM MISC [id number]  USE AS DIRECTED Wynetta Emery, Megan P, DO  Active   Lancets [facility] ULTRASOFT) lancets [id number]  TEST BLOOD SUGAR THREE TIMES DAILY Wynetta Emery, Megan P, DO  Active    lovastatin (MEVACOR) 40 MG tablet [id number]  Take 1 tablet (40 mg total) by mouth daily. Eulogio Bear, NP  Active   Menthol, Topical Analgesic, [facility] EX) [id number]  Apply 1 application topically 3 (three) times daily as needed (lower back pain.). [provider]  Active Self  metFORMIN (GLUCOPHAGE XR) 500 MG 24 hr tablet [id number]  Take 2 tablets (1,000 mg total) by mouth in the morning and at bedtime. Park Liter [place], DO  Active   ONETOUCH ULTRA test strip [id number]  TEST BLOOD SUGAR THREE TIMES DAILY. Wynetta Emery, Megan P, DO  Active   traZODone (DESYREL) 100 MG tablet [id number]  Take 1 tablet (100 mg total) by mouth at bedtime.  Patient taking differently: Take 100 mg by mouth at bedtime. Takes 1/2 tablet   Eulogio Bear, NP  Active Self  valACYclovir (VALTREX) 1000 MG tablet [id number]  Take 1 tablet (1,000 mg total) by mouth daily as needed. [name], DO  Active           Patient Active Problem List   Diagnosis Date Noted  . Tremor 05/28/2018  . BPH (benign prostatic hyperplasia) 03/22/2018  . Recurrent UTI 11/29/2017  . Osteoarthritis of multiple joints 08/23/2017  . PVD (peripheral vascular disease) ([place]) 05/14/2017  . Hard of hearing 12/07/2016  . History of tobacco abuse 12/07/2016  . History of ulcerative colitis 12/07/2016  . Ventral hernia 12/21/2015  . History of colon cancer, no staging 12/21/2015  . History of bladder cancer 08/05/2015  . Benign fibroma of prostate 08/05/2015  . History of alcoholism ([place]) 08/05/2015  . History of migraine headaches 08/05/2015  . Calculus of kidney 08/05/2015  . Essential hypertension 05/11/2015  . Hyperlipidemia 05/11/2015  . Affective bipolar disorder ([place]) 03/24/2015  . Diabetic peripheral neuropathy associated with type 2 diabetes mellitus ([place]) 03/24/2015  . Insomnia 03/24/2015  . History of  abdominal hernia 02/10/2015  . Anxiety and depression 02/10/2015  . Generalized anxiety disorder 05/18/2014   . Chronic prostatitis 06/12/2013  . Herpesviral infection of penis 11/07/2012  . Incomplete bladder emptying 11/07/2012  . Incisional hernia 11/07/2012  . Balanoposthitis 10/31/2012  . Benign localized prostatic hyperplasia with lower urinary tract symptoms (LUTS) 10/31/2012  . ED (erectile dysfunction) of organic origin 10/31/2012  . History of cancer of ureter 10/31/2012    Immunization History  Administered Date(s) Administered  . Influenza Split 05/18/2014  . Influenza, High Dose Seasonal PF 05/11/2015  . PFIZER(Purple Top)SARS-COV-2 Vaccination 10/12/2019, 11/03/2019  . Td 09/30/2020    Conditions to be addressed/monitored:  {USCCMDZASSESSMENTOPTIONS:23563}  There are no care plans that you recently modified to display for this patient.    Medication Assistance: {MEDASSISTANCEINFO:25044}  Patient's preferred pharmacy is:  [facility] [place], [place] - [address] [place] [address] Phone: [phone] Fax: [phone]  Uses pill box? {Yes or If no, why not?:20788} Pt endorses ***% compliance  We discussed: {Pharmacy options:24294} Patient decided to: {US Pharmacy Plan:23885}  Care Plan and Follow Up Patient Decision:  {FOLLOWUP:24991}  Plan: {CM FOLLOW UP PLAN:25073}  ***

## 2020-11-18 ENCOUNTER — Telehealth: Payer: Self-pay

## 2020-11-18 NOTE — Progress Notes (Signed)
Chronic Care Management Pharmacy Assistant   Name: Rick [name]  MRN: [id number] DOB: 06/18/1941  Reason for Encounter: Patient Call   Recent office visits:  No visits noted   Recent consult visits:  No visits noted   Hospital visits:  Medication Reconciliation was completed by comparing discharge summary, patient's EMR and Pharmacy list, and upon discussion with patient.  Admitted to the hospital on 11/03/20 due to multiple rib fractures involving four or more ribs. Discharge date was 11/03/20. Discharged from [facility] Emergency Department   New?Medications Started at [facility] Discharge:?? -started oxycodone/acetaminophen 5-353m- 1 tab every eight hours for pain -started Lidocaine 5% patch- apply one patch every twelve  For pain  Medication Changes at Hospital Discharge: No medications changed   Medications Discontinued at Hospital Discharge: No medications discontinued  Medications that remain the same after Hospital Discharge:??  -All other medications will remain the same.    Medications: Outpatient Encounter Medications as of 11/18/2020  Medication Sig  . BD PEN NEEDLE NANO U/F 32G X 4 MM MISC USE 1 TIME DAILY AS DIRECTED.  .Marland KitchenBlood Glucose Monitoring Suppl (ONE TOUCH ULTRA SYSTEM KIT) w/Device KIT 1 kit by Does not apply route once.  . carbidopa-levodopa (SINEMET IR) 25-100 MG tablet Take 1 tablet by mouth 4 (four) times daily.   . carvedilol (COREG) 6.25 MG tablet Take 1 tablet (6.25 mg total) by mouth 2 (two) times daily with a meal.  . diclofenac sodium (VOLTAREN) 1 % GEL Apply 2 g topically 4 (four) times daily. (Patient taking differently: Apply 2 g topically 4 (four) times daily as needed (lower back pain.).)  . enalapril (VASOTEC) 10 MG tablet Take 1 tablet (10 mg total) by mouth daily.  . fluticasone (FLONASE) 50 MCG/ACT nasal spray PLACE 2 SPRAYS INTO BOTH NOSTRILS 2 TIMES DAILY  . gabapentin (NEURONTIN) 300 MG capsule Take 2  capsules (600 mg total) by mouth 2 (two) times daily. (Patient taking differently: Take 600 mg by mouth 2 (two) times daily. Taking as needed)  . hydrochlorothiazide (HYDRODIURIL) 25 MG tablet TAKE ONE (1) TABLET EACH DAY (Patient taking differently: TAKE ONE (1) TABLET EACH DAY taking as needed)  . Insulin Glargine (BASAGLAR KWIKPEN) 100 UNIT/ML Inject 15 Units into the skin daily.  . Insulin Pen Needle (ULTICARE PEN NEEDLES) 29G X 12MM MISC USE AS DIRECTED  . Lancets (ONETOUCH ULTRASOFT) lancets TEST BLOOD SUGAR THREE TIMES DAILY  . lidocaine (LIDODERM) 5 % Place 1 patch onto the skin every 12 (twelve) hours. Remove & Discard patch within 12 hours or as directed by MD  . lovastatin (MEVACOR) 40 MG tablet Take 1 tablet (40 mg total) by mouth daily.  . Menthol, Topical Analgesic, (BENGAY EX) Apply 1 application topically 3 (three) times daily as needed (lower back pain.).  .Marland KitchenmetFORMIN (GLUCOPHAGE XR) 500 MG 24 hr tablet Take 2 tablets (1,000 mg total) by mouth in the morning and at bedtime.  .Glory RosebushULTRA test strip TEST BLOOD SUGAR THREE TIMES DAILY.  .Marland KitchenoxyCODONE-acetaminophen (PERCOCET) 5-325 MG tablet Take 1 tablet by mouth every 8 (eight) hours as needed for severe pain.  . traZODone (DESYREL) 100 MG tablet Take 1 tablet (100 mg total) by mouth at bedtime. (Patient taking differently: Take 100 mg by mouth at bedtime. Takes 1/2 tablet)  . valACYclovir (VALTREX) 1000 MG tablet Take 1 tablet (1,000 mg total) by mouth daily as needed.   No facility-administered encounter medications on file as of 11/18/2020.  Called patient for a general wellness check- up after his missed appointment with the CPP 04/06. The patient explained the nature of his ED visit on 03/22. After fracturing multiple ribs,he's in a lot of pain daily, including today. He was  told his injury would take 1-3 months to heal.  He questioned the usual time an injury of this extent takes to heal. He also stated that his blood  pressure had been getting lower even though he's being active. Based on this information and the anecdotes the patient gave me of how he's been dealing with the pain, I contacted the CPP with the concerns while Rick [name] was on the phone. After speaking with the CPP, Rick [name] is aware that the is allowed to take one oxycodone every eight hours as needed for his pain and that if the pain persists in a manner that limits daily activity he should contact someone on his care team to arrange a visit. I will follow up with Rick [name] in a week as he request to check in on him.   Wilford Sports CPA,CMA

## 2020-11-26 ENCOUNTER — Telehealth: Payer: Self-pay

## 2020-11-26 NOTE — Progress Notes (Signed)
Chronic Care Management Pharmacy Assistant   Name: Rick [name]  MRN: [id number] DOB: [birth year]  Reason for Encounter: General Phone Call   Medications: Outpatient Encounter Medications as of 11/26/2020  Medication Sig  . BD PEN NEEDLE NANO U/F 32G X 4 MM MISC USE 1 TIME DAILY AS DIRECTED.  [name] Blood Glucose Monitoring Suppl (ONE TOUCH ULTRA SYSTEM KIT) w/Device KIT 1 kit by Does not apply route once.  . carbidopa-levodopa (SINEMET IR) 25-100 MG tablet Take 1 tablet by mouth 4 (four) times daily.   . carvedilol (COREG) 6.25 MG tablet Take 1 tablet (6.25 mg total) by mouth 2 (two) times daily with a meal.  . diclofenac sodium (VOLTAREN) 1 % GEL Apply 2 g topically 4 (four) times daily. (Patient taking differently: Apply 2 g topically 4 (four) times daily as needed (lower back pain.).)  . enalapril (VASOTEC) 10 MG tablet Take 1 tablet (10 mg total) by mouth daily.  . fluticasone (FLONASE) 50 MCG/ACT nasal spray PLACE 2 SPRAYS INTO BOTH NOSTRILS 2 TIMES DAILY  . gabapentin (NEURONTIN) 300 MG capsule Take 2 capsules (600 mg total) by mouth 2 (two) times daily. (Patient taking differently: Take 600 mg by mouth 2 (two) times daily. Taking as needed)  . hydrochlorothiazide (HYDRODIURIL) 25 MG tablet TAKE ONE (1) TABLET EACH DAY (Patient taking differently: TAKE ONE (1) TABLET EACH DAY taking as needed)  . Insulin Glargine (BASAGLAR KWIKPEN) 100 UNIT/ML Inject 15 Units into the skin daily.  . Insulin Pen Needle (ULTICARE PEN NEEDLES) 29G X 12MM MISC USE AS DIRECTED  . Lancets (ONETOUCH ULTRASOFT) lancets TEST BLOOD SUGAR THREE TIMES DAILY  . lidocaine (LIDODERM) 5 % Place 1 patch onto the skin every 12 (twelve) hours. Remove & Discard patch within 12 hours or as directed by MD  . lovastatin (MEVACOR) 40 MG tablet Take 1 tablet (40 mg total) by mouth daily.  . Menthol, Topical Analgesic, (BENGAY EX) Apply 1 application topically 3 (three) times daily as needed (lower back pain.).  [name] metFORMIN  (GLUCOPHAGE XR) 500 MG 24 hr tablet Take 2 tablets (1,000 mg total) by mouth in the morning and at bedtime.  Glory Rosebush ULTRA test strip TEST BLOOD SUGAR THREE TIMES DAILY.  [name] oxyCODONE-acetaminophen (PERCOCET) 5-325 MG tablet Take 1 tablet by mouth every 8 (eight) hours as needed for severe pain.  . traZODone (DESYREL) 100 MG tablet Take 1 tablet (100 mg total) by mouth at bedtime. (Patient taking differently: Take 100 mg by mouth at bedtime. Takes 1/2 tablet)  . valACYclovir (VALTREX) 1000 MG tablet Take 1 tablet (1,000 mg total) by mouth daily as needed.   No facility-administered encounter medications on file as of 11/26/2020.   Called and spoke with Rick [name] to see how he was doing with his recovery. He stated he was doing better than he was last week. His pain is subsiding and he is becoming more active. He has also been making diet changes to help with his blood pressure. Overall he is more high- spirited and positive about the time it will take for him to heal.   Wilford Sports CPA, CMA

## 2020-12-09 ENCOUNTER — Encounter: Payer: Self-pay | Admitting: Family Medicine

## 2020-12-09 DIAGNOSIS — E119 Type 2 diabetes mellitus without complications: Secondary | ICD-10-CM | POA: Diagnosis not present

## 2020-12-09 DIAGNOSIS — H40003 Preglaucoma, unspecified, bilateral: Secondary | ICD-10-CM | POA: Diagnosis not present

## 2020-12-09 LAB — HM DIABETES EYE EXAM

## 2020-12-17 ENCOUNTER — Other Ambulatory Visit: Payer: Self-pay

## 2020-12-17 DIAGNOSIS — E785 Hyperlipidemia, unspecified: Secondary | ICD-10-CM

## 2020-12-17 MED ORDER — LOVASTATIN 40 MG PO TABS: 40.0000 mg | ORAL_TABLET | Freq: Every day | ORAL | 0 refills | Status: DC

## 2020-12-24 ENCOUNTER — Ambulatory Visit (INDEPENDENT_AMBULATORY_CARE_PROVIDER_SITE_OTHER): Payer: Medicare Other | Admitting: Pharmacist

## 2020-12-24 ENCOUNTER — Telehealth: Payer: Self-pay | Admitting: Pharmacist

## 2020-12-24 DIAGNOSIS — I10 Essential (primary) hypertension: Secondary | ICD-10-CM | POA: Diagnosis not present

## 2020-12-24 DIAGNOSIS — E1142 Type 2 diabetes mellitus with diabetic polyneuropathy: Secondary | ICD-10-CM | POA: Diagnosis not present

## 2020-12-24 NOTE — Progress Notes (Signed)
Chronic Care Management Pharmacy Note  01/11/2021 Name:  Rick [name] MRN:  [id number] DOB:  05-29-1941  Subjective: Rick [name] is an 80 y.o. year old male who is a primary patient of [name], DO.  The CCM team was consulted for assistance with disease management and care coordination needs.    Engaged with patient by telephone for follow up visit in response to provider referral for pharmacy case management and/or care coordination services.   Consent to Services:  The patient was given information about Chronic Care Management services, agreed to services, and gave verbal consent prior to initiation of services.  Please see initial visit note for detailed documentation.   Patient Care Team: [name], DO as PCP - General (Family Medicine) Poggi, Marshall Cork, MD as Consulting Physician (Orthopedic Surgery) Lucas Mallow, MD as Consulting Physician (Urology)  Recent office visits: 2/17/22Wynetta Emery (PCP)- augmentin, referralto hand surgery- cat bite cellulitis  Recent consult visits: 09/30/20- [facility]- sent to ED  Hospital visits: 2/17/22[facility] ED- given Zosyn 3/22/22[facility] ED- mutliple rib fractures s/p fall onto bucket while chopping wood, percocet & lidocaine  Objective:  Lab Results  Component Value Date   CREATININE 0.99 11/02/2020   BUN 16 11/02/2020   GFRNONAA >60 11/02/2020   GFRAA 89 05/27/2020   NA 140 11/02/2020   K 3.8 11/02/2020   CALCIUM 9.3 11/02/2020   CO2 25 11/02/2020   GLUCOSE 96 11/02/2020    Lab Results  Component Value Date/Time   HGBA1C 5.4 05/27/2020 10:30 AM   HGBA1C 6.2 12/23/2019 03:13 PM   HGBA1C 6.3 01/05/2016 12:00 AM   MICROALBUR 10 12/23/2019 03:13 PM   MICROALBUR 10 07/08/2019 10:51 AM    Last diabetic Eye exam:  Lab Results  Component Value Date/Time   HMDIABEYEEXA No Retinopathy 12/09/2020 08:14 AM    Last diabetic Foot exam: No results found for: HMDIABFOOTEX   Lab Results  Component Value  Date   CHOL 138 05/27/2020   HDL 50 05/27/2020   LDLCALC 73 05/27/2020   TRIG 77 05/27/2020   CHOLHDL 2.7 05/07/2018    Hepatic Function Latest Ref Rng & Units 12/23/2019 09/29/2019 07/08/2019  Total Protein 6.0 - 8.5 g/dL 6.8 7.8 7.1  Albumin 3.7 - 4.7 g/dL 4.7 4.7 4.7  AST 0 - 40 IU/L '18 21 18  ' ALT 0 - 44 IU/L '13 6 8  ' Alk Phosphatase 39 - 117 IU/L 62 57 70  Total Bilirubin 0.0 - 1.2 mg/dL 1.0 1.4(H) 0.7    Lab Results  Component Value Date/Time   TSH 2.150 07/08/2019 11:31 AM   TSH 1.540 12/11/2018 10:53 AM    CBC Latest Ref Rng & Units 11/02/2020 09/30/2020 12/23/2019  WBC 4.0 - 10.5 K/uL 9.2 10.4 7.3  Hemoglobin 13.0 - 17.0 g/dL 12.7(L) 11.6(L) 14.7  Hematocrit 39.0 - 52.0 % 39.4 35.1(L) 42.6  Platelets 150 - 400 K/uL 203 209 252    No results found for: VD25OH  Clinical ASCVD: No  The 10-year ASCVD risk score Mikey Bussing DC Jr., et al., 2013) is: 64.5%   Values used to calculate the score:     Age: 2 years     Sex: Male     Is Non-Hispanic African American: No     Diabetic: Yes     Tobacco smoker: No     Systolic Blood Pressure: 211 mmHg     Is BP treated: Yes     HDL Cholesterol: 50 mg/dL  Total Cholesterol: 138 mg/dL    Depression screen [facility] 2/9 12/26/2019 06/04/2019 01/02/2018  Decreased Interest 0 0 1  Down, Depressed, Hopeless '1 1 1  ' PHQ - 2 Score '1 1 2  ' Altered sleeping 0 - 3  Tired, decreased energy 2 - 1  Change in appetite 0 - 0  Feeling bad or failure about yourself  1 - 0  Trouble concentrating 2 - 0  Moving slowly or fidgety/restless 0 - 1  Suicidal thoughts 0 - 1  PHQ-9 Score 6 - 8  Difficult doing work/chores Not difficult at all - -  Some recent data might be hidden       Social History   Tobacco Use  Smoking Status Former Smoker  . Packs/day: 1.50  . Years: 15.00  . Pack years: 22.50  . Types: Cigarettes  . Start date: 08/14/1994  . Quit date: 03/23/2005  . Years since quitting: 15.8  Smokeless Tobacco Former Systems developer  . Types: Snuff,  Chew  . Quit date: 03/23/2010   BP Readings from Last 3 Encounters:  11/02/20 (!) 150/98  09/30/20 129/81  09/30/20 134/66   Pulse Readings from Last 3 Encounters:  11/02/20 98  09/30/20 80  09/30/20 94   Wt Readings from Last 3 Encounters:  11/02/20 165 lb (74.8 kg)  09/30/20 171 lb (77.6 kg)  09/30/20 172 lb 6.4 oz (78.2 kg)   BMI Readings from Last 3 Encounters:  11/02/20 25.09 kg/m  09/30/20 25.25 kg/m  09/30/20 26.21 kg/m    Assessment/Interventions: Review of patient past medical history, allergies, medications, health status, including review of consultants reports, laboratory and other test data, was performed as part of comprehensive evaluation and provision of chronic care management services.   SDOH:  (Social Determinants of Health) assessments and interventions performed: No  SDOH Screenings   Alcohol Screen: Not on file  Depression (QIW9-7): Not on file  Financial Resource Strain: Not on file  Food Insecurity: Not on file  Housing: Not on file  Physical Activity: Not on file  Social Connections: Not on file  Stress: Not on file  Tobacco Use: Medium Risk  . Smoking Tobacco Use: Former Smoker  . Smokeless Tobacco Use: Former Soil scientist Needs: Not on file     Immunization History  Administered Date(s) Administered  . Influenza Split 05/18/2014  . Influenza, High Dose Seasonal PF 05/11/2015  . PFIZER(Purple Top)SARS-COV-2 Vaccination 10/12/2019, 11/03/2019  . Td 09/30/2020    Conditions to be addressed/monitored:  Hypertension, Hyperlipidemia, Diabetes, Osteoarthritis and BPH  Care Plan : [place]  Updates made by Vladimir Faster, [place] since 01/11/2021 12:00 AM    Problem: DM, HTN, HLD, BPH, tremor, osteoarthritis, depression, anxiety   Priority: High    Long-Range Goal: Disease Management   Start Date: 12/24/2020  This Visit's Progress: On track  Priority: High  Note:   Current Barriers:  . Unable to independently  afford treatment regimen . Unable to independently monitor therapeutic efficacy . Does not adhere to prescribed medication regimen . Does not maintain contact with provider office  Pharmacist Clinical Goal(s):  [name] Patient will verbalize ability to afford treatment regimen . achieve adherence to monitoring guidelines and medication adherence to achieve therapeutic efficacy . maintain control of diabetes as evidenced by lab values, SMBG readings  . adhere to prescribed medication regimen as evidenced by fill dates through collaboration with PharmD and provider.   Interventions: . 1:1 collaboration with [name], DO regarding development and update  of comprehensive plan of care as evidenced by provider attestation and co-signature . Inter-disciplinary care team collaboration (see longitudinal plan of care) . Comprehensive medication review performed; medication list updated in electronic medical record BP Readings from Last 3 Encounters:  11/02/20 (!) 150/98  09/30/20 129/81  09/30/20 134/66   Hypertension (BP goal <130/80) -Not ideally controlled -Current treatment: . Carvedilol 6.25 mg bid . Enalapril 10 mg qd . HCTZ 25 mg qd -Medications previously tried: NA -Current home readings: 134/78, --Denies hypotensive/hypertensive symptoms -Educated on BP goals and benefits of medications for prevention of heart attack, stroke and kidney damage; Daily salt intake goal < 2300 mg; Exercise goal of 150 minutes per week; Symptoms of hypotension and importance of maintaining adequate hydration; -Counseled to monitor BP at home 3 times weekly, document, and provide log at future appointments -Recommended to continue current medication  Lab Results  Component Value Date   CHOL 138 05/27/2020   HDL 50 05/27/2020   LDLCALC 73 05/27/2020   TRIG 77 05/27/2020   CHOLHDL 2.7 05/07/2018    Hyperlipidemia: (LDL goal < 70) -Controlled -Current treatment: . Lovastatin 40 mg  qd -Medications previously tried: NA  --Educated on Cholesterol goals;  Benefits of statin for ASCVD risk reduction; -Counseled on diet and exercise extensively Recommended to continue current medication  Lab Results  Component Value Date   HGBA1C 5.4 05/27/2020   Lab Results  Component Value Date   CREATININE 0.99 11/02/2020  eGFR>39m/min  No results found for: VITAMINB12  Diabetes/ neuropathy (A1c goal <7%) -Controlled -Current medications: .Marland KitchenMetformin xr 1000 mg bid . Basaglar 15 u qd . Gabapentin 600 mg bid -Medications previously tried: NA -Current home glucose readings . fasting glucose: 125-130, -Denies hypoglycemic/hyperglycemic symptoms   -Current exercise: very active, no structured exercise regimen -Educated on Complications of diabetes including kidney damage, retinal damage, and cardiovascular disease; Exercise goal of 150 minutes per week; Prevention and management of hypoglycemic episodes; Benefits of routine self-monitoring of blood sugar; -Counseled to check feet daily and get yearly eye exams completed eye exam 12/09/20 no retinopathy -Counseled on diet and exercise extensively Recommended to continue current medication Assessed patient verified he is receiving Basaglar from patient assistance. -Recommend B12 level with next labs.  I  Patient Goals/Self-Care Activities . Patient will:  - take medications as prescribed focus on medication adherence by using pill box check glucose daily, document, and provide at future appointments check blood pressure 3 times weekly, document, and provide at future appointments collaborate with provider on medication access solutions target a minimum of 150 minutes of moderate intensity exercise weekly  Follow Up Plan: Telephone follow up appointment with care management team member scheduled for: CPA 4-6 weeks PharmD 2 months.          Medication Assistance: Basaglarobtained through [employer]  assistance program.  Enrollment ends 08/13/21  [facility] [place] [address]Phone: [phone]: [phone] Uses pill box? Yes Pt endorses 90% compliance  We discussed: Benefits of medication synchronization, packaging and delivery as well as enhanced pharmacist oversight with [employer]. Patient decided to: Continue current medication management strategy  Care Plan and Follow Up Patient Decision:  Patient agrees to Care Plan and Follow-up.  Plan: Telephone follow up appointment with care management team member scheduled for:  4- 6 weeks CPA, 2 months PharmD  JJunita Push HKenton KingfisherPharmD, BWellingtonPharmacist [facility]  [facility] [phone]

## 2021-01-11 NOTE — Patient Instructions (Addendum)
Visit Information  It was a pleasure speaking with you today. Thank you for letting me be part of your clinical team. Please call with any questions or concerns.   Goals Addressed            This Visit's Progress   . Monitor and Manage My Blood Sugar-Diabetes Type 2       Timeframe:  Long-Range Goal Priority:  Medium Start Date:                             Expected End Date:                       Follow Up Date 2 month follow up    - check blood sugar at prescribed times - check blood sugar if I feel it is too high or too low - enter blood sugar readings and medication or insulin into daily log - take the blood sugar log to all doctor visits - take the blood sugar meter to all doctor visits    Why is this important?    Checking your blood sugar at home helps to keep it from getting very high or very low.   Writing the results in a diary or log helps the doctor know how to care for you.   Your blood sugar log should have the time, date and the results.   Also, write down the amount of insulin or other medicine that you take.   Other information, like what you ate, exercise done and how you were feeling, will also be helpful.     Notes:     . Perform Foot Care-Diabetes Type 2       Timeframe:  Short-Term Goal Priority:  High Start Date:                             Expected End Date:                       Follow Up Date 2 month follow-up    - check feet daily for cuts, sores or redness - keep feet up while sitting - trim toenails straight across - wash and dry feet carefully every day - wear comfortable, cotton socks - wear comfortable, well-fitting shoes    Why is this important?    Good foot care is very important when you have diabetes.   There are many things you can do to keep your feet healthy and catch a problem early.    Notes:     . Track and Manage My Blood Pressure-Hypertension       Timeframe:  Long-Range Goal Priority:  Medium Start Date:                              Expected End Date:                       Follow Up Date 2 months    - check blood pressure 3 times per week - write blood pressure results in a log or diary    Why is this important?    You won't feel high blood pressure, but it can still hurt your blood vessels.   High blood pressure can cause heart or kidney problems. It can also  cause a stroke.   Making lifestyle changes like losing a little weight or eating less salt will help.   Checking your blood pressure at home and at different times of the day can help to control blood pressure.   If the doctor prescribes medicine remember to take it the way the doctor ordered.   Call the office if you cannot afford the medicine or if there are questions about it.     Notes:        The patient verbalized understanding of instructions, educational materials, and care plan provided today and declined offer to receive copy of patient instructions, educational materials, and care plan.  4-6 weeks CPA, 2 months PharmD  Telephone follow up appointment with pharmacy team member scheduled for:  Junita Push. Elliot Meldrum PharmD, BCPS Clinical Pharmacist [phone]  Managing Your Hypertension Hypertension, also called high blood pressure, is when the force of the blood pressing against the walls of the arteries is too strong. Arteries are blood vessels that carry blood from your heart throughout your body. Hypertension forces the heart to work harder to pump blood and may cause the arteries to become narrow or stiff. Understanding blood pressure readings Your personal target blood pressure may vary depending on your medical conditions, your age, and other factors. A blood pressure reading includes a higher number over a lower number. Ideally, your blood pressure should be below 120/80. You should know that:  The first, or top, number is called the systolic pressure. It is a measure of the pressure in your arteries as your heart  beats.  The second, or bottom number, is called the diastolic pressure. It is a measure of the pressure in your arteries as the heart relaxes. Blood pressure is classified into four stages. Based on your blood pressure reading, your health care provider may use the following stages to determine what type of treatment you need, if any. Systolic pressure and diastolic pressure are measured in a unit called mmHg. Normal  Systolic pressure: below 831.  Diastolic pressure: below 80. Elevated  Systolic pressure: 517-616.  Diastolic pressure: below 80. Hypertension stage 1  Systolic pressure: 073-710.  Diastolic pressure: 62-69. Hypertension stage 2  Systolic pressure: 485 or above.  Diastolic pressure: 90 or above. How can this condition affect me? Managing your hypertension is an important responsibility. Over time, hypertension can damage the arteries and decrease blood flow to important parts of the body, including the brain, heart, and kidneys. Having untreated or uncontrolled hypertension can lead to:  A heart attack.  A stroke.  A weakened blood vessel (aneurysm).  Heart failure.  Kidney damage.  Eye damage.  Metabolic syndrome.  Memory and concentration problems.  Vascular dementia. What actions can I take to manage this condition? Hypertension can be managed by making lifestyle changes and possibly by taking medicines. Your health care provider will help you make a plan to bring your blood pressure within a normal range. Nutrition  Eat a diet that is high in fiber and potassium, and low in salt (sodium), added sugar, and fat. An example eating plan is called the Dietary Approaches to Stop Hypertension (DASH) diet. To eat this way: ? Eat plenty of fresh fruits and vegetables. Try to fill one-half of your plate at each meal with fruits and vegetables. ? Eat whole grains, such as whole-wheat pasta, brown rice, or whole-grain bread. Fill about one-fourth of your plate  with whole grains. ? Eat low-fat dairy products. ? Avoid fatty cuts of meat,  processed or cured meats, and poultry with skin. Fill about one-fourth of your plate with lean proteins such as fish, chicken without skin, beans, eggs, and tofu. ? Avoid pre-made and processed foods. These tend to be higher in sodium, added sugar, and fat.  Reduce your daily sodium intake. Most people with hypertension should eat less than 1,500 mg of sodium a day.   Lifestyle  Work with your health care provider to maintain a healthy body weight or to lose weight. Ask what an ideal weight is for you.  Get at least 30 minutes of exercise that causes your heart to beat faster (aerobic exercise) most days of the week. Activities may include walking, swimming, or biking.  Include exercise to strengthen your muscles (resistance exercise), such as weight lifting, as part of your weekly exercise routine. Try to do these types of exercises for 30 minutes at least 3 days a week.  Do not use any products that contain nicotine or tobacco, such as cigarettes, e-cigarettes, and chewing tobacco. If you need help quitting, ask your health care provider.  Control any long-term (chronic) conditions you have, such as high cholesterol or diabetes.  Identify your sources of stress and find ways to manage stress. This may include meditation, deep breathing, or making time for fun activities.   Alcohol use  Do not drink alcohol if: ? Your health care provider tells you not to drink. ? You are pregnant, may be pregnant, or are planning to become pregnant.  If you drink alcohol: ? Limit how much you use to:  0-1 drink a day for women.  0-2 drinks a day for men. ? Be aware of how much alcohol is in your drink. In the [place] one drink equals one 12 oz bottle of beer (355 mL), one 5 oz glass of wine (148 mL), or one 1 oz glass of hard liquor (44 mL). Medicines Your health care provider may prescribe medicine if lifestyle changes are  not enough to get your blood pressure under control and if:  Your systolic blood pressure is 130 or higher.  Your diastolic blood pressure is 80 or higher. Take medicines only as told by your health care provider. Follow the directions carefully. Blood pressure medicines must be taken as told by your health care provider. The medicine does not work as well when you skip doses. Skipping doses also puts you at risk for problems. Monitoring Before you monitor your blood pressure:  Do not smoke, drink caffeinated beverages, or exercise within 30 minutes before taking a measurement.  Use the bathroom and empty your bladder (urinate).  Sit quietly for at least 5 minutes before taking measurements. Monitor your blood pressure at home as told by your health care provider. To do this:  Sit with your back straight and supported.  Place your feet flat on the floor. Do not cross your legs.  Support your arm on a flat surface, such as a table. Make sure your upper arm is at heart level.  Each time you measure, take two or three readings one minute apart and record the results. You may also need to have your blood pressure checked regularly by your health care provider.   General information  Talk with your health care provider about your diet, exercise habits, and other lifestyle factors that may be contributing to hypertension.  Review all the medicines you take with your health care provider because there may be side effects or interactions.  Keep all visits as told  by your health care provider. Your health care provider can help you create and adjust your plan for managing your high blood pressure. Where to find more information  National Heart, Lung, and Blood Institute: https://wilson-eaton.com/  American Heart Association: www.heart.org Contact a health care provider if:  You think you are having a reaction to medicines you have taken.  You have repeated (recurrent) headaches.  You feel  dizzy.  You have swelling in your ankles.  You have trouble with your vision. Get help right away if:  You develop a severe headache or confusion.  You have unusual weakness or numbness, or you feel faint.  You have severe pain in your chest or abdomen.  You vomit repeatedly.  You have trouble breathing. These symptoms may represent a serious problem that is an emergency. Do not wait to see if the symptoms will go away. Get medical help right away. Call your local emergency services (911 in the [place] Do not drive yourself to the hospital. Summary  Hypertension is when the force of blood pumping through your arteries is too strong. If this condition is not controlled, it may put you at risk for serious complications.  Your personal target blood pressure may vary depending on your medical conditions, your age, and other factors. For most people, a normal blood pressure is less than 120/80.  Hypertension is managed by lifestyle changes, medicines, or both.  Lifestyle changes to help manage hypertension include losing weight, eating a healthy, low-sodium diet, exercising more, stopping smoking, and limiting alcohol. This information is not intended to replace advice given to you by your health care provider. Make sure you discuss any questions you have with your health care provider. Document Revised: 09/05/2019 Document Reviewed: 07/01/2019 Elsevier Patient Education  2021 [employer].

## 2021-01-13 ENCOUNTER — Telehealth: Payer: Self-pay | Admitting: Pharmacist

## 2021-01-13 NOTE — Chronic Care Management (AMB) (Signed)
Chronic Care Management Pharmacy Assistant   Name: Rick [name]  MRN: [id number] DOB: 1941-03-17  Reason for Encounter: Disease State Hypertension  Recent office visits:  None noted  Recent consult visits:  None  Hospital visits:  Medication Reconciliation was completed by comparing discharge summary, patient's EMR and Pharmacy list, and upon discussion with patient.  Admitted to the hospital on 11/02/2020 due to Fall. Discharge date was 11/02/2020. Discharged from [place]?Medications Started at [facility] Discharge:?? -started Lidocaine 5% and Oxycodone-acetaminophen 5-333m due to rib fractures  Medication Changes at Hospital Discharge: -None noted  Medications Discontinued at Hospital Discharge: -None noted  Medications that remain the same after Hospital Discharge:??  -All other medications will remain the same.     Medication Reconciliation was completed by comparing discharge summary, patient's EMR and Pharmacy list, and upon discussion with patient.  Admitted to the hospital on 09/30/2020 due to Animal bite. Discharge date was 09/30/2020. [facility]  Medication Changes at Hospital Discharge: -None noted  Medications Discontinued at Hospital Discharge: -None noted  Medications that remain the same after Hospital Discharge:??  -All other medications will remain the same.   Medications: Outpatient Encounter Medications as of 01/13/2021  Medication Sig Note   BD PEN NEEDLE NANO U/F 32G X 4 MM MISC USE 1 TIME DAILY AS DIRECTED.    Blood Glucose Monitoring Suppl (ONE TOUCH ULTRA SYSTEM KIT) w/Device KIT 1 kit by Does not apply route once.    carbidopa-levodopa (SINEMET IR) 25-100 MG tablet Take 1 tablet by mouth 4 (four) times daily.     carvedilol (COREG) 6.25 MG tablet Take 1 tablet (6.25 mg total) by  mouth 2 (two) times daily with a meal.    diclofenac sodium (VOLTAREN) 1 % GEL Apply 2 g topically 4 (four) times daily. (Patient taking differently: Apply 2 g topically 4 (four) times daily as needed (lower back pain.).)    enalapril (VASOTEC) 10 MG tablet Take 1 tablet (10 mg total) by mouth daily.    fluticasone (FLONASE) 50 MCG/ACT nasal spray PLACE 2 SPRAYS INTO BOTH NOSTRILS 2 TIMES DAILY    gabapentin (NEURONTIN) 300 MG capsule Take 2 capsules (600 mg total) by mouth 2 (two) times daily. (Patient taking differently: Take 600 mg by mouth 2 (two) times daily. Taking as needed)    hydrochlorothiazide (HYDRODIURIL) 25 MG tablet TAKE ONE (1) TABLET EACH DAY (Patient taking differently: TAKE ONE (1) TABLET EACH DAY taking as needed) 12/24/2020: Does not take every day    Insulin Glargine (BASAGLAR KWIKPEN) 100 UNIT/ML Inject 15 Units into the skin daily.    Insulin Pen Needle (ULTICARE PEN NEEDLES) 29G X 12MM MISC USE AS DIRECTED    Lancets (ONETOUCH ULTRASOFT) lancets TEST BLOOD SUGAR THREE TIMES DAILY    lidocaine (LIDODERM) 5 % Place 1 patch onto the skin every 12 (twelve) hours. Remove & Discard patch within 12 hours or as directed by MD    lovastatin (MEVACOR) 40 MG tablet Take 1 tablet (40 mg total) by mouth daily. Please schedule an appointment for more refills    Menthol, Topical Analgesic, (BENGAY EX) Apply 1 application topically 3 (three) times daily as needed (lower back pain.).    metFORMIN (GLUCOPHAGE XR) 500 MG 24 hr tablet Take 2 tablets (1,000 mg total) by mouth in the morning and at bedtime.    ONETOUCH ULTRA test  strip TEST BLOOD SUGAR THREE TIMES DAILY.    traZODone (DESYREL) 100 MG tablet Take 1 tablet (100 mg total) by mouth at bedtime. (Patient taking differently: Take 100 mg by mouth at bedtime. Takes 1/2 tablet)    valACYclovir (VALTREX) 1000 MG tablet Take 1 tablet (1,000 mg total) by mouth daily as needed.    No facility-administered encounter medications on file as of  01/13/2021.   Reviewed chart prior to disease state call. Spoke with patient regarding BP  Recent Office Vitals: BP Readings from Last 3 Encounters:  11/02/20 (!) 150/98  09/30/20 129/81  09/30/20 134/66   Pulse Readings from Last 3 Encounters:  11/02/20 98  09/30/20 80  09/30/20 94    Wt Readings from Last 3 Encounters:  11/02/20 165 lb (74.8 kg)  09/30/20 171 lb (77.6 kg)  09/30/20 172 lb 6.4 oz (78.2 kg)     Kidney Function Lab Results  Component Value Date/Time   CREATININE 0.99 11/02/2020 12:44 PM   CREATININE 0.97 09/30/2020 02:00 PM   CREATININE 0.94 11/28/2012 12:51 PM   GFRNONAA >60 11/02/2020 12:44 PM   GFRNONAA >60 11/28/2012 12:51 PM   GFRAA 89 05/27/2020 12:41 PM   GFRAA >60 11/28/2012 12:51 PM    BMP Latest Ref Rng & Units 11/02/2020 09/30/2020 05/27/2020  Glucose 70 - 99 mg/dL 96 75 88  BUN 8 - 23 mg/dL _0 Creatinine 0.61 - 1.24 mg/dL 0.99 0.97 0.94  BUN/Creat Ratio 10 - 24 - - 9(L)  Sodium 135 - 145 mmol/L 140 139 144  Potassium 3.5 - 5.1 mmol/L 3.8 3.7 4.7  Chloride 98 - 111 mmol/L 108 107 107(H)  CO2 22 - 32 mmol/L 25 23 13(L)  Calcium 8.9 - 10.3 mg/dL 9.3 9.1 9.7    Current antihypertensive regimen:  Carvedilol 6.25 mg bid Enalapril 10 mg qd HCTZ 25 mg qd   Adherence Review: Is the patient currently on ACE/ARB medication? Yes Does the patient have >5 day gap between last estimated fill dates? No  Unable to reach patient for his HTN Disease state call as well as his call back about his medications.  Star Rating Drugs: Enalapril 10 mg Last filled:12/16/2020 90 DS Lovastatin 40 mg Last filled:12/17/2020 30 DS Metformin 500 mg Last filled:12/10/2020 30 DS   Myriam Elta [place], [place]

## 2021-01-24 ENCOUNTER — Other Ambulatory Visit: Payer: Self-pay | Admitting: Family Medicine

## 2021-01-24 DIAGNOSIS — E785 Hyperlipidemia, unspecified: Secondary | ICD-10-CM

## 2021-01-24 DIAGNOSIS — I10 Essential (primary) hypertension: Secondary | ICD-10-CM

## 2021-01-24 MED ORDER — VALACYCLOVIR HCL 1 G PO TABS: 1000.0000 mg | ORAL_TABLET | Freq: Every day | ORAL | 0 refills | Status: DC | PRN

## 2021-01-24 MED ORDER — ENALAPRIL MALEATE 10 MG PO TABS: 10.0000 mg | ORAL_TABLET | Freq: Every day | ORAL | 0 refills | Status: DC

## 2021-01-24 NOTE — Telephone Encounter (Signed)
Medication: lovastatin (MEVACOR) 40 MG tablet [[id number]] , carbidopa-levodopa (SINEMET IR) 25-100 MG tablet [[id number]]  ENDED, valACYclovir (VALTREX) 1000 MG tablet [[id number]], enalapril (VASOTEC) 10 MG tablet [[id number]]   Apt scheduled 02/01/21  Has the patient contacted their pharmacy? YES  (Agent: If no, request that the patient contact the pharmacy for the refill.) (Agent: If yes, when and what did the pharmacy advise?)  Preferred Pharmacy (with phone number or street name): [facility] [address] [place], [place] - [place] [address] Phone: [phone] Fax: [phone] Hours: Not open 24 hours    Agent: Please be advised that RX refills may take up to 3 business days. We ask that you follow-up with your pharmacy.

## 2021-02-01 ENCOUNTER — Other Ambulatory Visit: Payer: Self-pay

## 2021-02-01 ENCOUNTER — Ambulatory Visit (INDEPENDENT_AMBULATORY_CARE_PROVIDER_SITE_OTHER): Payer: Medicare Other | Admitting: Family Medicine

## 2021-02-01 ENCOUNTER — Encounter: Payer: Self-pay | Admitting: Family Medicine

## 2021-02-01 VITALS — BP 127/81 | HR 80 | Temp 98.2°F | Ht 67.0 in | Wt 170.0 lb

## 2021-02-01 DIAGNOSIS — I10 Essential (primary) hypertension: Secondary | ICD-10-CM

## 2021-02-01 DIAGNOSIS — R3914 Feeling of incomplete bladder emptying: Secondary | ICD-10-CM | POA: Diagnosis not present

## 2021-02-01 DIAGNOSIS — E782 Mixed hyperlipidemia: Secondary | ICD-10-CM

## 2021-02-01 DIAGNOSIS — E1142 Type 2 diabetes mellitus with diabetic polyneuropathy: Secondary | ICD-10-CM | POA: Diagnosis not present

## 2021-02-01 DIAGNOSIS — Z1159 Encounter for screening for other viral diseases: Secondary | ICD-10-CM

## 2021-02-01 DIAGNOSIS — R251 Tremor, unspecified: Secondary | ICD-10-CM | POA: Diagnosis not present

## 2021-02-01 DIAGNOSIS — N390 Urinary tract infection, site not specified: Secondary | ICD-10-CM | POA: Diagnosis not present

## 2021-02-01 DIAGNOSIS — F1021 Alcohol dependence, in remission: Secondary | ICD-10-CM

## 2021-02-01 DIAGNOSIS — F3171 Bipolar disorder, in partial remission, most recent episode hypomanic: Secondary | ICD-10-CM

## 2021-02-01 DIAGNOSIS — R8281 Pyuria: Secondary | ICD-10-CM | POA: Diagnosis not present

## 2021-02-01 DIAGNOSIS — N401 Enlarged prostate with lower urinary tract symptoms: Secondary | ICD-10-CM

## 2021-02-01 DIAGNOSIS — I739 Peripheral vascular disease, unspecified: Secondary | ICD-10-CM

## 2021-02-01 MED ORDER — LOVASTATIN 40 MG PO TABS: ORAL_TABLET | ORAL | 0 refills | Status: DC

## 2021-02-01 MED ORDER — VALACYCLOVIR HCL 1 G PO TABS: 1000.0000 mg | ORAL_TABLET | Freq: Every day | ORAL | 0 refills | Status: DC | PRN

## 2021-02-01 MED ORDER — METFORMIN HCL ER 500 MG PO TB24: 1000.0000 mg | ORAL_TABLET | Freq: Two times a day (BID) | ORAL | 0 refills | Status: DC

## 2021-02-01 MED ORDER — CARVEDILOL 6.25 MG PO TABS: 6.2500 mg | ORAL_TABLET | Freq: Two times a day (BID) | ORAL | 0 refills | Status: DC

## 2021-02-01 MED ORDER — BASAGLAR KWIKPEN 100 UNIT/ML ~~LOC~~ SOPN: 10.0000 [IU] | PEN_INJECTOR | Freq: Every day | SUBCUTANEOUS | 0 refills | Status: DC

## 2021-02-01 MED ORDER — ENALAPRIL MALEATE 10 MG PO TABS: 10.0000 mg | ORAL_TABLET | Freq: Every day | ORAL | 0 refills | Status: DC

## 2021-02-01 MED ORDER — TRAZODONE HCL 100 MG PO TABS: 100.0000 mg | ORAL_TABLET | Freq: Every day | ORAL | 0 refills | Status: DC

## 2021-02-01 NOTE — Assessment & Plan Note (Signed)
Not interested in medication. Continue to monitor. Call with any concerns.

## 2021-02-01 NOTE — Assessment & Plan Note (Signed)
Will keep BP and cholesterol and sugars under good control. Call with any concerns. Continue to monitor.

## 2021-02-01 NOTE — Assessment & Plan Note (Signed)
Under good control on current regimen. Continue current regimen. Continue to monitor. Call with any concerns. Refills given. Will stop his HCTZ. Labs drawn today.

## 2021-02-01 NOTE — Assessment & Plan Note (Signed)
Under good control on current regimen. Continue current regimen. Continue to monitor. Call with any concerns. Follow up with urology as needed.

## 2021-02-01 NOTE — Assessment & Plan Note (Signed)
No symptoms. Will check Urine culture. Await results.

## 2021-02-01 NOTE — Assessment & Plan Note (Signed)
Under good control on current regimen. Continue current regimen. Continue to monitor. Call with any concerns. Refills given. Labs drawn today.   

## 2021-02-01 NOTE — Progress Notes (Signed)
BP 127/81   Pulse 80   Temp 98.2 F (36.8 C)   Ht 5\' 7"  (1.702 m)   Wt 170 lb (77.1 kg)   SpO2 97%   BMI 26.63 kg/m    Subjective:    Patient ID: Rick [name], male    DOB: Jul 31, 1941, 80 y.o.   MRN: [id number]  HPI: Rick [name] is a 80 y.o. male  Chief Complaint  Patient presents with   Diabetes   Hypertension   Hyperlipidemia   DIABETES Hypoglycemic episodes:no Polydipsia/polyuria: no Visual disturbance: no Chest pain: no Paresthesias: no Glucose Monitoring: no  Accucheck frequency: Not Checking Taking Insulin?: yes Blood Pressure Monitoring: not checking Retinal Examination: Up to Date Foot Exam: Up to Date Diabetic Education: Completed Pneumovax: Up to Date Influenza: Up to Date Aspirin: yes  HYPERTENSION / HYPERLIPIDEMIA Satisfied with current treatment? yes Duration of hypertension: chronic BP monitoring frequency: not checking BP medication side effects: no Past BP meds: carvedilol, enlalapril Duration of hyperlipidemia: chronic Cholesterol medication side effects: no Cholesterol supplements: none Past cholesterol medications: lovastatin Medication compliance: excellent compliance Aspirin: yes Recent stressors: no Recurrent headaches: no Visual changes: no Palpitations: no Dyspnea: no Chest pain: no Lower extremity edema: no Dizzy/lightheaded: no  Relevant past medical, surgical, family and social history reviewed and updated as indicated. Interim medical history since our last visit reviewed. Allergies and medications reviewed and updated.  Review of Systems  Constitutional: Negative.   Respiratory: Negative.    Cardiovascular: Negative.   Gastrointestinal: Negative.   Musculoskeletal: Negative.   Neurological:  Positive for tremors. Negative for dizziness, seizures, syncope, facial asymmetry, speech difficulty, weakness, light-headedness, numbness and headaches.  Psychiatric/Behavioral: Negative.     Per HPI unless  specifically indicated above     Objective:    BP 127/81   Pulse 80   Temp 98.2 F (36.8 C)   Ht 5\' 7"  (1.702 m)   Wt 170 lb (77.1 kg)   SpO2 97%   BMI 26.63 kg/m   Wt Readings from Last 3 Encounters:  02/01/21 170 lb (77.1 kg)  11/02/20 165 lb (74.8 kg)  09/30/20 171 lb (77.6 kg)    Physical Exam Vitals and nursing note reviewed.  Constitutional:      General: He is not in acute distress.    Appearance: Normal appearance. He is not ill-appearing, toxic-appearing or diaphoretic.  HENT:     Head: Normocephalic and atraumatic.     Right Ear: External ear normal.     Left Ear: External ear normal.     Nose: Nose normal.     Mouth/Throat:     Mouth: Mucous membranes are moist.     Pharynx: Oropharynx is clear.  Eyes:     General: No scleral icterus.       Right eye: No discharge.        Left eye: No discharge.     Extraocular Movements: Extraocular movements intact.     Conjunctiva/sclera: Conjunctivae normal.     Pupils: Pupils are equal, round, and reactive to light.  Cardiovascular:     Rate and Rhythm: Normal rate and regular rhythm.     Pulses: Normal pulses.     Heart sounds: Normal heart sounds. No murmur heard.   No friction rub. No gallop.  Pulmonary:     Effort: Pulmonary effort is normal. No respiratory distress.     Breath sounds: Normal breath sounds. No stridor. No wheezing, rhonchi or rales.  Chest:  Chest wall: No tenderness.  Musculoskeletal:        General: Normal range of motion.     Cervical back: Normal range of motion and neck supple.  Skin:    General: Skin is warm and dry.     Capillary Refill: Capillary refill takes less than 2 seconds.     Coloration: Skin is not jaundiced or pale.     Findings: No bruising, erythema, lesion or rash.  Neurological:     General: No focal deficit present.     Mental Status: He is alert and oriented to person, place, and time. Mental status is at baseline.  Psychiatric:        Mood and Affect: Mood  normal.        Behavior: Behavior normal.        Thought Content: Thought content normal.        Judgment: Judgment normal.    Results for orders placed or performed in visit on 12/13/20  HM DIABETES EYE EXAM  Result Value Ref Range   HM Diabetic Eye Exam No Retinopathy No Retinopathy      Assessment & Plan:   Problem List Items Addressed This Visit       Cardiovascular and Mediastinum   Essential hypertension - Primary    Under good control on current regimen. Continue current regimen. Continue to monitor. Call with any concerns. Refills given. Will stop his HCTZ. Labs drawn today.        Relevant Medications   lovastatin (MEVACOR) 40 MG tablet   enalapril (VASOTEC) 10 MG tablet   carvedilol (COREG) 6.25 MG tablet   Other Relevant Orders   Comprehensive metabolic panel   CBC with Differential/Platelet   TSH   Microalbumin, Urine Waived   PVD (peripheral vascular disease) (HCC)    Will keep BP and cholesterol and sugars under good control. Call with any concerns. Continue to monitor.        Relevant Medications   lovastatin (MEVACOR) 40 MG tablet   enalapril (VASOTEC) 10 MG tablet   carvedilol (COREG) 6.25 MG tablet     Endocrine   Diabetic peripheral neuropathy associated with type 2 diabetes mellitus ([place])    Doing well with A1c of 6.2- we will decrease his basaglar to 10units daily and recheck 3 months. Goal of getting him off his insulin.       Relevant Medications   Insulin Glargine (BASAGLAR KWIKPEN) 100 UNIT/ML   metFORMIN (GLUCOPHAGE XR) 500 MG 24 hr tablet   traZODone (DESYREL) 100 MG tablet   lovastatin (MEVACOR) 40 MG tablet   enalapril (VASOTEC) 10 MG tablet   Other Relevant Orders   Comprehensive metabolic panel   CBC with Differential/Platelet   Microalbumin, Urine Waived   Bayer DCA Hb A1c Waived     Genitourinary   Recurrent UTI    No symptoms. Will check Urine culture. Await results.        Relevant Medications   valACYclovir  (VALTREX) 1000 MG tablet   Other Relevant Orders   Comprehensive metabolic panel   CBC with Differential/Platelet   Urinalysis, Routine w reflex microscopic   BPH (benign prostatic hyperplasia)    Under good control on current regimen. Continue current regimen. Continue to monitor. Call with any concerns. Follow up with urology as needed.        Relevant Orders   Comprehensive metabolic panel   CBC with Differential/Platelet   PSA     Other   Affective bipolar disorder ([place])  Not interested in medication. Continue to monitor. Call with any concerns.        Hyperlipidemia    Under good control on current regimen. Continue current regimen. Continue to monitor. Call with any concerns. Refills given. Labs drawn today.        Relevant Medications   lovastatin (MEVACOR) 40 MG tablet   enalapril (VASOTEC) 10 MG tablet   carvedilol (COREG) 6.25 MG tablet   Other Relevant Orders   Comprehensive metabolic panel   CBC with Differential/Platelet   Lipid Panel w/o Chol/HDL Ratio   History of alcoholism ([place])    Continue to abstain. Call with any concerns.        Tremor    Needs follow up with neurology. Will get him back in with neurology. Call with any concerns.        Other Visit Diagnoses     Encounter for hepatitis C screening test for low risk patient       Labs drawn today. Await results.    Relevant Orders   Comprehensive metabolic panel   CBC with Differential/Platelet   Hepatitis C Antibody   Pyuria       Will check urine culture. Await results. Treat as needed.    Relevant Orders   Urine Culture        Follow up plan: Return in about 3 months (around 05/04/2021).

## 2021-02-01 NOTE — Assessment & Plan Note (Signed)
Doing well with A1c of 6.2- we will decrease his basaglar to 10units daily and recheck 3 months. Goal of getting him off his insulin.

## 2021-02-01 NOTE — Assessment & Plan Note (Signed)
Continue to abstain. Call with any concerns.

## 2021-02-01 NOTE — Assessment & Plan Note (Signed)
Needs follow up with neurology. Will get him back in with neurology. Call with any concerns.

## 2021-02-02 LAB — HEPATITIS C ANTIBODY: Hep C Virus Ab: <0.1 s/co ratio (ref 0.0–0.9)

## 2021-02-02 LAB — MICROSCOPIC EXAMINATION

## 2021-02-02 LAB — CBC WITH DIFFERENTIAL/PLATELET
Basophils Absolute: 0.1 10*3/uL (ref 0.0–0.2)
Basos: 1 %
EOS (ABSOLUTE): 0.4 10*3/uL (ref 0.0–0.4)
Eos: 5 %
Hematocrit: 40.1 % (ref 37.5–51.0)
Hemoglobin: 13.8 g/dL (ref 13.0–17.7)
Immature Grans (Abs): 0 10*3/uL (ref 0.0–0.1)
Immature Granulocytes: 0 %
Lymphocytes Absolute: 2.3 10*3/uL (ref 0.7–3.1)
Lymphs: 29 %
MCH: 29.3 pg (ref 26.6–33.0)
MCHC: 34.4 g/dL (ref 31.5–35.7)
MCV: 85 fL (ref 79–97)
Monocytes Absolute: 0.7 10*3/uL (ref 0.1–0.9)
Monocytes: 9 %
Neutrophils Absolute: 4.4 10*3/uL (ref 1.4–7.0)
Neutrophils: 56 %
Platelets: 261 10*3/uL (ref 150–450)
RBC: 4.71 x10E6/uL (ref 4.14–5.80)
RDW: 14.2 % (ref 11.6–15.4)
WBC: 7.9 10*3/uL (ref 3.4–10.8)

## 2021-02-02 LAB — URINALYSIS, ROUTINE W REFLEX MICROSCOPIC
Bilirubin, UA: NEGATIVE
Glucose, UA: NEGATIVE
Ketones, UA: NEGATIVE
Nitrite, UA: POSITIVE — AB
Specific Gravity, UA: 1.02 (ref 1.005–1.030)
Urobilinogen, Ur: 0.2 mg/dL (ref 0.2–1.0)
pH, UA: 5 (ref 5.0–7.5)

## 2021-02-02 LAB — MICROALBUMIN, URINE WAIVED
Creatinine, Urine Waived: 200 mg/dL (ref 10–300)
Microalb, Ur Waived: 80 mg/L — ABNORMAL HIGH (ref 0–19)

## 2021-02-02 LAB — COMPREHENSIVE METABOLIC PANEL
ALT: 12 IU/L (ref 0–44)
AST: 14 IU/L (ref 0–40)
Albumin/Globulin Ratio: 1.6 (ref 1.2–2.2)
Albumin: 4.5 g/dL (ref 3.7–4.7)
Alkaline Phosphatase: 57 IU/L (ref 44–121)
BUN/Creatinine Ratio: 16 (ref 10–24)
BUN: 17 mg/dL (ref 8–27)
Bilirubin Total: 1.1 mg/dL (ref 0.0–1.2)
CO2: 18 mmol/L — ABNORMAL LOW (ref 20–29)
Calcium: 9.4 mg/dL (ref 8.6–10.2)
Chloride: 103 mmol/L (ref 96–106)
Creatinine, Ser: 1.07 mg/dL (ref 0.76–1.27)
Globulin, Total: 2.8 g/dL (ref 1.5–4.5)
Glucose: 121 mg/dL — ABNORMAL HIGH (ref 65–99)
Potassium: 3.9 mmol/L (ref 3.5–5.2)
Sodium: 138 mmol/L (ref 134–144)
Total Protein: 7.3 g/dL (ref 6.0–8.5)
eGFR: 71 mL/min/{1.73_m2} (ref >59)

## 2021-02-02 LAB — LIPID PANEL W/O CHOL/HDL RATIO
Cholesterol, Total: 149 mg/dL (ref 100–199)
HDL: 47 mg/dL (ref >39)
LDL Chol Calc (NIH): 79 mg/dL (ref 0–99)
Triglycerides: 127 mg/dL (ref 0–149)
VLDL Cholesterol Cal: 23 mg/dL (ref 5–40)

## 2021-02-02 LAB — TSH: TSH: 1.89 u[IU]/mL (ref 0.450–4.500)

## 2021-02-02 LAB — PSA: Prostate Specific Ag, Serum: 1 ng/mL (ref 0.0–4.0)

## 2021-02-02 LAB — BAYER DCA HB A1C WAIVED: HB A1C (BAYER DCA - WAIVED): 6.2 % (ref <7.0)

## 2021-02-03 ENCOUNTER — Encounter: Payer: Self-pay | Admitting: Family Medicine

## 2021-02-07 ENCOUNTER — Other Ambulatory Visit: Payer: Self-pay | Admitting: Family Medicine

## 2021-02-07 LAB — URINE CULTURE

## 2021-02-07 MED ORDER — AMOXICILLIN-POT CLAVULANATE 875-125 MG PO TABS: 1.0000 | ORAL_TABLET | Freq: Two times a day (BID) | ORAL | 0 refills | Status: DC

## 2021-02-08 ENCOUNTER — Telehealth: Payer: Self-pay

## 2021-02-08 NOTE — Telephone Encounter (Signed)
Copied from [place] [id number]. Topic: General - Other >> Feb 07, 2021  5:02 PM Pawlus, Brayton Layman A wrote: Reason for CRM: Pt wanted to go over his latest lab results and wanted more information about the medication sent in, please advise.

## 2021-02-08 NOTE — Telephone Encounter (Signed)
Letter was mailed out- please go over results.

## 2021-02-08 NOTE — Telephone Encounter (Signed)
Message sent to wrong office.

## 2021-02-09 ENCOUNTER — Telehealth: Payer: Self-pay

## 2021-02-09 NOTE — Telephone Encounter (Signed)
Scheduled patient for Neurology with Dr. Melrose Nakayama for August 1, 2:15 PM. Scheduled a month out per patient request. Attempted to call patient, number disconnected.

## 2021-02-22 ENCOUNTER — Telehealth: Payer: Self-pay

## 2021-02-22 NOTE — Chronic Care Management (AMB) (Signed)
Error

## 2021-02-25 ENCOUNTER — Telehealth: Payer: Self-pay

## 2021-03-21 ENCOUNTER — Ambulatory Visit: Payer: Self-pay | Admitting: *Deleted

## 2021-03-21 NOTE — Telephone Encounter (Signed)
Copied from [place] [id number]. Topic: General - Other >> Mar 21, 2021  5:01 PM Tessa Lerner A wrote: Reason for CRM: Patient has returned missed call from [place] team  Please contact again when possible   Patient called and advised he will need an appointment to evaluate the UTI, he verbalized understanding. Appointment scheduled for Wednesday 03/23/21 at 1000 with Dr. Wynetta Emery.

## 2021-03-21 NOTE — Telephone Encounter (Signed)
Patient is calling to report ha has another UTI. Patient is requesting low dose antibiotic to take daily. Patient states he has continuing UTI- he gets UTI because he is not able to empty his bladder completely and he feels he needs prophylactic treatment daily. Patient has seen urology- and he states he checked out fine there- it is cheaper for him to have PCP handle this. Patient advised I would pass along the request for his provider to review and the office will call him back with her response. ( [id number] is requesting Augmentin - low dose- he states he can take 1/4 dose and that would keep him clear) Reason for Disposition  All other males with painful urination  Answer Assessment - Initial Assessment Questions 1. SEVERITY: "How bad is the pain?"  (e.g., Scale 1-10; mild, moderate, or severe)   - MILD (1-3): complains slightly about urination hurting   - MODERATE (4-7): interferes with normal activities     - SEVERE (8-10): excruciating, unwilling or unable to urinate because of the pain      mild 2. FREQUENCY: "How many times have you had painful urination today?"      Every time 3. PATTERN: "Is pain present every time you urinate or just sometimes?"      Every time 4. ONSET: "When did the painful urination start?"      1 week 5. FEVER: "Do you have a fever?" If Yes, ask: "What is your temperature, how was it measured, and when did it start?"     No fever 6. PAST UTI: "Have you had a urine infection before?" If Yes, ask: "When was the last time?" and "What happened that time?"      yes 7. CAUSE: "What do you think is causing the painful urination?"      UTI 8. OTHER SYMPTOMS: "Do you have any other symptoms?" (e.g., flank pain, penile discharge, scrotal pain, blood in urine)     Cloudy urine  Protocols used: Urination Pain - Male-A-AH

## 2021-03-21 NOTE — Telephone Encounter (Signed)
Patient called, ringing busy, no answer.   Message from Scherrie Gerlach sent at 03/21/2021  7:40 AM EDT  Pt would like a call from a nurse regarding his sx of a UTI.  Pt having burning with urination. He says his urine is cloudy. Pt declined to make an appt at this time, but says he realizes he may need appt later on.  would like to have discussion with nurse first.

## 2021-03-23 ENCOUNTER — Ambulatory Visit (INDEPENDENT_AMBULATORY_CARE_PROVIDER_SITE_OTHER): Payer: Medicare Other | Admitting: Family Medicine

## 2021-03-23 ENCOUNTER — Encounter: Payer: Self-pay | Admitting: Family Medicine

## 2021-03-23 ENCOUNTER — Other Ambulatory Visit: Payer: Self-pay

## 2021-03-23 VITALS — BP 123/79 | HR 73 | Temp 97.6°F | Wt 171.0 lb

## 2021-03-23 DIAGNOSIS — N390 Urinary tract infection, site not specified: Secondary | ICD-10-CM

## 2021-03-23 LAB — URINALYSIS, ROUTINE W REFLEX MICROSCOPIC
Bilirubin, UA: NEGATIVE
Glucose, UA: NEGATIVE
Ketones, UA: NEGATIVE
Nitrite, UA: POSITIVE — AB
Protein,UA: NEGATIVE
Specific Gravity, UA: <1.005 — ABNORMAL LOW (ref 1.005–1.030)
Urobilinogen, Ur: 0.2 mg/dL (ref 0.2–1.0)
pH, UA: 5.5 (ref 5.0–7.5)

## 2021-03-23 LAB — MICROSCOPIC EXAMINATION: Epithelial Cells (non renal): NONE SEEN /hpf (ref 0–10)

## 2021-03-23 MED ORDER — AMOXICILLIN-POT CLAVULANATE 875-125 MG PO TABS: 1.0000 | ORAL_TABLET | Freq: Two times a day (BID) | ORAL | 0 refills | Status: DC

## 2021-03-23 NOTE — Progress Notes (Signed)
BP 123/79   Pulse 73   Temp 97.6 F (36.4 C) (Oral)   Wt 171 lb (77.6 kg)   SpO2 97%   BMI 26.78 kg/m    Subjective:    Patient ID: Rick [name], male    DOB: 17-Jul-1941, 80 y.o.   MRN: [id number]  HPI: Rick [name] is a 80 y.o. male  Chief Complaint  Patient presents with   Urinary Tract Infection    Pt states he has been having some cloudy urine since Monday    URINARY SYMPTOMS- took 1 of his augmentin that he had left over Duration: 2 days Dysuria: yes- better now Urinary frequency: yes Urgency: yes Small volume voids: no Symptom severity: mild Urinary incontinence: yes Foul odor: yes Hematuria: yes Abdominal pain: no Back pain: no Suprapubic pain/pressure: no Flank pain: no Fever:  no Vomiting: no Relief with cranberry juice: no Relief with pyridium: no Status: better Previous urinary tract infection: yes Recurrent urinary tract infection: yes History of sexually transmitted disease: no Penile discharge: no Treatments attempted: antibiotics and increasing fluids   Relevant past medical, surgical, family and social history reviewed and updated as indicated. Interim medical history since our last visit reviewed. Allergies and medications reviewed and updated.  Review of Systems  Constitutional: Negative.   Respiratory: Negative.    Cardiovascular: Negative.   Gastrointestinal: Negative.   Genitourinary:  Positive for dysuria, frequency and urgency. Negative for decreased urine volume, difficulty urinating, enuresis, flank pain, genital sores, hematuria, penile discharge, penile pain, penile swelling, scrotal swelling and testicular pain.  Musculoskeletal: Negative.   Psychiatric/Behavioral: Negative.     Per HPI unless specifically indicated above     Objective:    BP 123/79   Pulse 73   Temp 97.6 F (36.4 C) (Oral)   Wt 171 lb (77.6 kg)   SpO2 97%   BMI 26.78 kg/m   Wt Readings from Last 3 Encounters:  03/23/21 171 lb (77.6 kg)   02/01/21 170 lb (77.1 kg)  11/02/20 165 lb (74.8 kg)    Physical Exam Vitals and nursing note reviewed.  Constitutional:      General: He is not in acute distress.    Appearance: Normal appearance. He is not ill-appearing, toxic-appearing or diaphoretic.  HENT:     Head: Normocephalic and atraumatic.     Right Ear: External ear normal.     Left Ear: External ear normal.     Nose: Nose normal.     Mouth/Throat:     Mouth: Mucous membranes are moist.     Pharynx: Oropharynx is clear.  Eyes:     General: No scleral icterus.       Right eye: No discharge.        Left eye: No discharge.     Extraocular Movements: Extraocular movements intact.     Conjunctiva/sclera: Conjunctivae normal.     Pupils: Pupils are equal, round, and reactive to light.  Cardiovascular:     Rate and Rhythm: Normal rate and regular rhythm.     Pulses: Normal pulses.     Heart sounds: Normal heart sounds. No murmur heard.   No friction rub. No gallop.  Pulmonary:     Effort: Pulmonary effort is normal. No respiratory distress.     Breath sounds: Normal breath sounds. No stridor. No wheezing, rhonchi or rales.  Chest:     Chest wall: No tenderness.  Musculoskeletal:        General: Normal range of motion.  Cervical back: Normal range of motion and neck supple.  Skin:    General: Skin is warm and dry.     Capillary Refill: Capillary refill takes less than 2 seconds.     Coloration: Skin is not jaundiced or pale.     Findings: No bruising, erythema, lesion or rash.  Neurological:     General: No focal deficit present.     Mental Status: He is alert and oriented to person, place, and time. Mental status is at baseline.  Psychiatric:        Mood and Affect: Mood normal.        Behavior: Behavior normal.        Thought Content: Thought content normal.        Judgment: Judgment normal.    Results for orders placed or performed in visit on 02/01/21  Urine Culture   Specimen: Urine   UR  Result  Value Ref Range   Urine Culture, Routine Final report (A)    Organism ID, Bacteria Escherichia coli (A)    Antimicrobial Susceptibility Comment   Microscopic Examination   Urine  Result Value Ref Range   WBC, UA 11-30 (A) 0 - 5 /hpf   RBC 0-2 0 - 2 /hpf   Epithelial Cells (non renal) 0-10 0 - 10 /hpf   Casts Present (A) None seen /lpf   Cast Type Hyaline casts N/A   Mucus, UA Present (A) Not Estab.   Bacteria, UA Many (A) None seen/Few  Comprehensive metabolic panel  Result Value Ref Range   Glucose 121 (H) 65 - 99 mg/dL   BUN 17 8 - 27 mg/dL   Creatinine, Ser 1.07 0.76 - 1.27 mg/dL   eGFR 71 >59 mL/min/1.73   BUN/Creatinine Ratio 16 10 - 24   Sodium 138 134 - 144 mmol/L   Potassium 3.9 3.5 - 5.2 mmol/L   Chloride 103 96 - 106 mmol/L   CO2 18 (L) 20 - 29 mmol/L   Calcium 9.4 8.6 - 10.2 mg/dL   Total Protein 7.3 6.0 - 8.5 g/dL   Albumin 4.5 3.7 - 4.7 g/dL   Globulin, Total 2.8 1.5 - 4.5 g/dL   Albumin/Globulin Ratio 1.6 1.2 - 2.2   Bilirubin Total 1.1 0.0 - 1.2 mg/dL   Alkaline Phosphatase 57 44 - 121 IU/L   AST 14 0 - 40 IU/L   ALT 12 0 - 44 IU/L  CBC with Differential/Platelet  Result Value Ref Range   WBC 7.9 3.4 - 10.8 x10E3/uL   RBC 4.71 4.14 - 5.80 x10E6/uL   Hemoglobin 13.8 13.0 - 17.7 g/dL   Hematocrit 40.1 37.5 - 51.0 %   MCV 85 79 - 97 fL   MCH 29.3 26.6 - 33.0 pg   MCHC 34.4 31.5 - 35.7 g/dL   RDW 14.2 11.6 - 15.4 %   Platelets 261 150 - 450 x10E3/uL   Neutrophils 56 Not Estab. %   Lymphs 29 Not Estab. %   Monocytes 9 Not Estab. %   Eos 5 Not Estab. %   Basos 1 Not Estab. %   Neutrophils Absolute 4.4 1.4 - 7.0 x10E3/uL   Lymphocytes Absolute 2.3 0.7 - 3.1 x10E3/uL   Monocytes Absolute 0.7 0.1 - 0.9 x10E3/uL   EOS (ABSOLUTE) 0.4 0.0 - 0.4 x10E3/uL   Basophils Absolute 0.1 0.0 - 0.2 x10E3/uL   Immature Granulocytes 0 Not Estab. %   Immature Grans (Abs) 0.0 0.0 - 0.1 x10E3/uL  Lipid Panel w/o Chol/HDL Ratio  Result Value Ref Range   Cholesterol, Total  149 100 - 199 mg/dL   Triglycerides 127 0 - 149 mg/dL   HDL 47 >39 mg/dL   VLDL Cholesterol Cal 23 5 - 40 mg/dL   LDL Chol Calc (NIH) 79 0 - 99 mg/dL  PSA  Result Value Ref Range   Prostate Specific Ag, Serum 1.0 0.0 - 4.0 ng/mL  TSH  Result Value Ref Range   TSH 1.890 0.450 - 4.500 uIU/mL  Urinalysis, Routine w reflex microscopic  Result Value Ref Range   Specific Gravity, UA 1.020 1.005 - 1.030   pH, UA 5.0 5.0 - 7.5   Color, UA Yellow Yellow   Appearance Ur Cloudy (A) Clear   Leukocytes,UA 3+ (A) Negative   Protein,UA Trace (A) Negative/Trace   Glucose, UA Negative Negative   Ketones, UA Negative Negative   RBC, UA Trace (A) Negative   Bilirubin, UA Negative Negative   Urobilinogen, Ur 0.2 0.2 - 1.0 mg/dL   Nitrite, UA Positive (A) Negative   Microscopic Examination See below:   Microalbumin, Urine Waived  Result Value Ref Range   Microalb, Ur Waived 80 (H) 0 - 19 mg/L   Creatinine, Urine Waived 200 10 - 300 mg/dL   Microalb/Creat Ratio 30-300 (H) <30 mg/g  Bayer DCA Hb A1c Waived  Result Value Ref Range   HB A1C (BAYER DCA - WAIVED) 6.2 <7.0 %  Hepatitis C Antibody  Result Value Ref Range   Hep C Virus Ab <0.1 0.0 - 0.9 s/co ratio      Assessment & Plan:   Problem List Items Addressed This Visit       Genitourinary   Recurrent UTI - Primary    Will treat empirically with augmentin while we await culture based on previous UTIs. Has not been in to see his urologist in quite some time. Follow up appointment with Dr. Yves Dill scheduled today.        Relevant Orders   Urinalysis, Routine w reflex microscopic   Urine Culture     Follow up plan: Return as scheduled.

## 2021-03-23 NOTE — Patient Instructions (Signed)
You have an appointment with Dr. Yves Dill at [facility] on Wednesday March 30, 2021 at 10:30 AM.

## 2021-03-23 NOTE — Assessment & Plan Note (Addendum)
1+ leuks, 1+ blood and + nitrates. Will treat empirically with augmentin while we await culture based on previous UTIs. Has not been in to see his urologist in quite some time. Follow up appointment with Dr. Yves Dill scheduled today.

## 2021-03-26 LAB — URINE CULTURE

## 2021-03-30 DIAGNOSIS — N3020 Other chronic cystitis without hematuria: Secondary | ICD-10-CM | POA: Diagnosis not present

## 2021-03-30 DIAGNOSIS — R3914 Feeling of incomplete bladder emptying: Secondary | ICD-10-CM | POA: Diagnosis not present

## 2021-04-27 DIAGNOSIS — R251 Tremor, unspecified: Secondary | ICD-10-CM | POA: Diagnosis not present

## 2021-04-27 DIAGNOSIS — K117 Disturbances of salivary secretion: Secondary | ICD-10-CM | POA: Diagnosis not present

## 2021-04-27 DIAGNOSIS — R413 Other amnesia: Secondary | ICD-10-CM | POA: Diagnosis not present

## 2021-05-02 ENCOUNTER — Other Ambulatory Visit: Payer: Self-pay

## 2021-05-02 ENCOUNTER — Ambulatory Visit (INDEPENDENT_AMBULATORY_CARE_PROVIDER_SITE_OTHER): Payer: Medicare Other | Admitting: Nurse Practitioner

## 2021-05-02 ENCOUNTER — Encounter: Payer: Self-pay | Admitting: Nurse Practitioner

## 2021-05-02 VITALS — BP 131/68 | HR 91 | Temp 97.6°F | Wt 168.6 lb

## 2021-05-02 DIAGNOSIS — R309 Painful micturition, unspecified: Secondary | ICD-10-CM | POA: Diagnosis not present

## 2021-05-02 DIAGNOSIS — N390 Urinary tract infection, site not specified: Secondary | ICD-10-CM

## 2021-05-02 LAB — URINALYSIS, ROUTINE W REFLEX MICROSCOPIC
Bilirubin, UA: NEGATIVE
Glucose, UA: NEGATIVE
Ketones, UA: NEGATIVE
Nitrite, UA: NEGATIVE
Protein,UA: NEGATIVE
Specific Gravity, UA: 1.02 (ref 1.005–1.030)
Urobilinogen, Ur: 0.2 mg/dL (ref 0.2–1.0)
pH, UA: 6.5 (ref 5.0–7.5)

## 2021-05-02 LAB — MICROSCOPIC EXAMINATION: Bacteria, UA: NONE SEEN

## 2021-05-02 MED ORDER — AMOXICILLIN-POT CLAVULANATE 500-125 MG PO TABS: 1.0000 | ORAL_TABLET | Freq: Two times a day (BID) | ORAL | 0 refills | Status: AC

## 2021-05-02 NOTE — Progress Notes (Signed)
Results discussed with patient during visit. Augmentin sent for patient. Will send for culture.

## 2021-05-02 NOTE — Progress Notes (Signed)
BP 131/68   Pulse 91   Temp 97.6 F (36.4 C) (Oral)   Wt 168 lb 9.6 oz (76.5 kg)   SpO2 99%   BMI 26.41 kg/m    Subjective:    Patient ID: Rick [name], male    DOB: 1941/05/04, 80 y.o.   MRN: [id number]  HPI: Rick [name] is a 80 y.o. male  Chief Complaint  Patient presents with   Urinary Tract Infection    Pt states he has had cloudy urine and painful urination since yesterday    URINARY SYMPTOMS Dysuria: yes Urinary frequency: yes Urgency: yes Small volume voids: yes Symptom severity: yes Urinary incontinence: no Foul odor: yes Hematuria: yes Abdominal pain: yes Back pain: no Suprapubic pain/pressure: yes Flank pain: no Fever:  no Vomiting: no Relief with cranberry juice: no Relief with pyridium: no Status: worse Previous urinary tract infection: yes Recurrent urinary tract infection: yes   Relevant past medical, surgical, family and social history reviewed and updated as indicated. Interim medical history since our last visit reviewed. Allergies and medications reviewed and updated.  Review of Systems  Constitutional:  Negative for fatigue.  Gastrointestinal:  Negative for abdominal pain.  Genitourinary:  Negative for dysuria, flank pain, frequency and hematuria.  Musculoskeletal:  Negative for back pain.   Per HPI unless specifically indicated above     Objective:    BP 131/68   Pulse 91   Temp 97.6 F (36.4 C) (Oral)   Wt 168 lb 9.6 oz (76.5 kg)   SpO2 99%   BMI 26.41 kg/m   Wt Readings from Last 3 Encounters:  05/02/21 168 lb 9.6 oz (76.5 kg)  03/23/21 171 lb (77.6 kg)  02/01/21 170 lb (77.1 kg)    Physical Exam Vitals and nursing note reviewed.  Constitutional:      General: He is not in acute distress.    Appearance: Normal appearance. He is not ill-appearing, toxic-appearing or diaphoretic.  HENT:     Head: Normocephalic.     Right Ear: External ear normal.     Left Ear: External ear normal.     Nose: Nose normal. No  congestion or rhinorrhea.     Mouth/Throat:     Mouth: Mucous membranes are moist.  Eyes:     General:        Right eye: No discharge.        Left eye: No discharge.     Extraocular Movements: Extraocular movements intact.     Conjunctiva/sclera: Conjunctivae normal.     Pupils: Pupils are equal, round, and reactive to light.  Cardiovascular:     Rate and Rhythm: Normal rate and regular rhythm.     Heart sounds: No murmur heard. Pulmonary:     Effort: Pulmonary effort is normal. No respiratory distress.     Breath sounds: Normal breath sounds. No wheezing, rhonchi or rales.  Abdominal:     General: Abdomen is flat. Bowel sounds are normal. There is no distension.     Tenderness: There is no abdominal tenderness. There is no right CVA tenderness, left CVA tenderness or guarding.  Musculoskeletal:     Cervical back: Normal range of motion and neck supple.  Skin:    General: Skin is warm and dry.     Capillary Refill: Capillary refill takes less than 2 seconds.  Neurological:     General: No focal deficit present.     Mental Status: He is alert and oriented to person, place, and  time.  Psychiatric:        Mood and Affect: Mood normal.        Behavior: Behavior normal.        Thought Content: Thought content normal.        Judgment: Judgment normal.    Results for orders placed or performed in visit on 03/23/21  Urine Culture   Specimen: Urine   UR  Result Value Ref Range   Urine Culture, Routine Final report (A)    Organism ID, Bacteria Escherichia coli (A)    Antimicrobial Susceptibility Comment   Microscopic Examination   Urine  Result Value Ref Range   WBC, UA 0-5 0 - 5 /hpf   RBC 0-2 0 - 2 /hpf   Epithelial Cells (non renal) None seen 0 - 10 /hpf   Bacteria, UA Many (A) None seen/Few  Urinalysis, Routine w reflex microscopic  Result Value Ref Range   Specific Gravity, UA <1.005 (L) 1.005 - 1.030   pH, UA 5.5 5.0 - 7.5   Color, UA Yellow Yellow   Appearance Ur  Cloudy (A) Clear   Leukocytes,UA 1+ (A) Negative   Protein,UA Negative Negative/Trace   Glucose, UA Negative Negative   Ketones, UA Negative Negative   RBC, UA 1+ (A) Negative   Bilirubin, UA Negative Negative   Urobilinogen, Ur 0.2 0.2 - 1.0 mg/dL   Nitrite, UA Positive (A) Negative   Microscopic Examination See below:       Assessment & Plan:   Problem List Items Addressed This Visit       Genitourinary   Recurrent UTI - Primary    Ongoing. Augmentin sent to the pharmacy based on patient's past cultures. Per patient he has been sexually active.  Will send urine for culture and STD testing.  Lengthy discussion had with patient to follow up with Urology.  Patient requested Bactrim for daily use. Recommend he discuss this with Urology.       Relevant Orders   Urine Culture   Chlamydia/Gonococcus/Trichomonas, NAA([facility])   Other Visit Diagnoses     Painful urination       Relevant Orders   Urinalysis, Routine w reflex microscopic        Follow up plan: Return if symptoms worsen or fail to improve.

## 2021-05-02 NOTE — Assessment & Plan Note (Signed)
Ongoing. Augmentin sent to the pharmacy based on patient's past cultures. Per patient he has been sexually active.  Will send urine for culture and STD testing.  Lengthy discussion had with patient to follow up with Urology.  Patient requested Bactrim for daily use. Recommend he discuss this with Urology.

## 2021-05-04 ENCOUNTER — Ambulatory Visit: Payer: Medicare Other | Admitting: Family Medicine

## 2021-05-04 DIAGNOSIS — Z8554 Personal history of malignant neoplasm of ureter: Secondary | ICD-10-CM | POA: Diagnosis not present

## 2021-05-04 DIAGNOSIS — N390 Urinary tract infection, site not specified: Secondary | ICD-10-CM | POA: Diagnosis not present

## 2021-05-05 LAB — URINE CULTURE: Organism ID, Bacteria: NO GROWTH

## 2021-05-05 NOTE — Progress Notes (Signed)
Please let patient know there was no bacteria in his urine culture.  He can complete the course of antibiotics. I recommend he follow up with Dr. Rogers Blocker.

## 2021-05-06 LAB — CHLAMYDIA/GONOCOCCUS/TRICHOMONAS, NAA
Chlamydia by NAA: NEGATIVE
Gonococcus by NAA: NEGATIVE
Trich vag by NAA: NEGATIVE

## 2021-05-06 NOTE — Progress Notes (Signed)
Please let patient know that his urine labs were normal.

## 2021-05-11 ENCOUNTER — Telehealth: Payer: Self-pay

## 2021-05-11 NOTE — Progress Notes (Signed)
Chronic Care Management Pharmacy Assistant   Name: Rick [name]  MRN: [id number] DOB: 08-Jan-1941   Reason for Encounter: Disease State Hypertension     Recent office visits:  05/02/21 Rick Billings NP - Seen for UTI - Labs ordered - Start Augmentin 500 mg 1 tablet for 7 days - No follow up noted  03/23/21 Rick [name] for recurrent UTI - Labs ordered -  Start Augmentin - Follow up with Urology on 03/30/21 02/01/21  Rick Roys DO - Seen for hypertension - Labs ordered -  Stop hydrochlorothiazide - decrease his basaglar to 10units daily- Follow up in 3 months   Recent consult visits:  04/27/21 Rick Hearing MD  - Neurology - Seen for Tremor - Decrease Carbidopa 25/100 mg to one tablet twice daily - Start Primidone 50 mg, 1 tab nightly for one week then increase to 50 mg twice daily - No follow up noted   Hospital visits:  None in previous 6 months  Medications: Outpatient Encounter Medications as of 05/11/2021  Medication Sig   BD PEN NEEDLE NANO U/F 32G X 4 MM MISC USE 1 TIME DAILY AS DIRECTED.   Blood Glucose Monitoring Suppl (ONE TOUCH ULTRA SYSTEM KIT) w/Device KIT 1 kit by Does not apply route once.   carbidopa-levodopa (SINEMET IR) 25-100 MG tablet TAKE ONE TABLET BY MOUTH THREE TIMES A DAY   carvedilol (COREG) 6.25 MG tablet Take 1 tablet (6.25 mg total) by mouth 2 (two) times daily with a meal.   diclofenac sodium (VOLTAREN) 1 % GEL Apply 2 g topically 4 (four) times daily. (Patient taking differently: Apply 2 g topically 4 (four) times daily as needed (lower back pain.).)   enalapril (VASOTEC) 10 MG tablet Take 1 tablet (10 mg total) by mouth daily.   fluticasone (FLONASE) 50 MCG/ACT nasal spray PLACE 2 SPRAYS INTO BOTH NOSTRILS 2 TIMES DAILY   gabapentin (NEURONTIN) 300 MG capsule Take 2 capsules (600 mg total) by mouth 2 (two) times daily. (Patient taking differently: Take 600 mg by mouth 2 (two) times daily. Taking as needed)   Insulin Glargine  (BASAGLAR KWIKPEN) 100 UNIT/ML Inject 10 Units into the skin daily.   Insulin Pen Needle (ULTICARE PEN NEEDLES) 29G X 12MM MISC USE AS DIRECTED   Lancets (ONETOUCH ULTRASOFT) lancets TEST BLOOD SUGAR THREE TIMES DAILY   lidocaine (LIDODERM) 5 % Place 1 patch onto the skin every 12 (twelve) hours. Remove & Discard patch within 12 hours or as directed by MD   lovastatin (MEVACOR) 40 MG tablet TAKE 1 TABLET BY MOUTH DAILY.   Menthol, Topical Analgesic, (BENGAY EX) Apply 1 application topically 3 (three) times daily as needed (lower back pain.).   metFORMIN (GLUCOPHAGE XR) 500 MG 24 hr tablet Take 2 tablets (1,000 mg total) by mouth in the morning and at bedtime.   ONETOUCH ULTRA test strip TEST BLOOD SUGAR THREE TIMES DAILY.   traZODone (DESYREL) 100 MG tablet Take 1 tablet (100 mg total) by mouth at bedtime.   valACYclovir (VALTREX) 1000 MG tablet Take 1 tablet (1,000 mg total) by mouth daily as needed.   No facility-administered encounter medications on file as of 05/11/2021.    Care Gaps:  Reviewed chart prior to disease state call. Spoke with patient regarding BP  Recent Office Vitals: BP Readings from Last 3 Encounters:  05/02/21 131/68  03/23/21 123/79  02/01/21 127/81   Pulse Readings from Last 3 Encounters:  05/02/21 91  03/23/21 73  02/01/21 80    Wt Readings from Last 3 Encounters:  05/02/21 168 lb 9.6 oz (76.5 kg)  03/23/21 171 lb (77.6 kg)  02/01/21 170 lb (77.1 kg)     Kidney Function Lab Results  Component Value Date/Time   CREATININE 1.07 02/01/2021 03:57 PM   CREATININE 0.99 11/02/2020 12:44 PM   CREATININE 0.94 11/28/2012 12:51 PM   GFRNONAA >60 11/02/2020 12:44 PM   GFRNONAA >60 11/28/2012 12:51 PM   GFRAA 89 05/27/2020 12:41 PM   GFRAA >60 11/28/2012 12:51 PM    BMP Latest Ref Rng & Units 02/01/2021 11/02/2020 09/30/2020  Glucose 65 - 99 mg/dL 121(H) 96 75  BUN 8 - 27 mg/dL '17 16 14  ' Creatinine 0.76 - 1.27 mg/dL 1.07 0.99 0.97  BUN/Creat Ratio 10 - 24 16  - -  Sodium 134 - 144 mmol/L 138 140 139  Potassium 3.5 - 5.2 mmol/L 3.9 3.8 3.7  Chloride 96 - 106 mmol/L 103 108 107  CO2 20 - 29 mmol/L 18(L) 25 23  Calcium 8.6 - 10.2 mg/dL 9.4 9.3 9.1   3 unsuccessful attempts to reach the patient. Unable to leave a voicemail to return call due to no voicemail prompt. Unable to schedule a November appointment with Rick [name] at this time.    Star Rating Drugs: Enalapril 10 mg 04/04/21 90 DS Basalgar last fill 05/02/21 02/01/21 Lovastatin 40 mg last fill 02/01/21 90 DS Metformin 500 mg last fill 02/01/21 90 DS  Andee Poles, CMA

## 2021-06-03 ENCOUNTER — Other Ambulatory Visit: Payer: Self-pay | Admitting: Family Medicine

## 2021-06-04 NOTE — Telephone Encounter (Signed)
Requested Prescriptions  Pending Prescriptions Disp Refills  . hydrochlorothiazide (HYDRODIURIL) 25 MG tablet [Pharmacy Med Name: HYDROCHLOROTHIAZIDE 25 MG TAB] 90 tablet     Sig: TAKE 1 TABLET BY MOUTH DAILY     Cardiovascular: Diuretics - Thiazide Passed - 06/03/2021  4:58 PM      Passed - Ca in normal range and within 360 days    Calcium  Date Value Ref Range Status  02/01/2021 9.4 8.6 - 10.2 mg/dL Final   Calcium, Total  Date Value Ref Range Status  11/28/2012 8.5 8.5 - 10.1 mg/dL Final         Passed - Cr in normal range and within 360 days    Creatinine  Date Value Ref Range Status  11/28/2012 0.94 0.60 - 1.30 mg/dL Final   Creatinine, Ser  Date Value Ref Range Status  02/01/2021 1.07 0.76 - 1.27 mg/dL Final         Passed - K in normal range and within 360 days    Potassium  Date Value Ref Range Status  02/01/2021 3.9 3.5 - 5.2 mmol/L Final  11/28/2012 4.1 3.5 - 5.1 mmol/L Final         Passed - Na in normal range and within 360 days    Sodium  Date Value Ref Range Status  02/01/2021 138 134 - 144 mmol/L Final  11/28/2012 138 136 - 145 mmol/L Final         Passed - Last BP in normal range    BP Readings from Last 1 Encounters:  05/02/21 131/68         Passed - Valid encounter within last 6 months    Recent Outpatient Visits          1 month ago Recurrent UTI   [facility] [name], NP   2 months ago Recurrent UTI   [facility], [place], DO   4 months ago Essential hypertension   [place], Megan P, DO   8 months ago Cellulitis of finger of left hand   [place], [place], DO   1 year ago Diabetic peripheral neuropathy associated with type 2 diabetes mellitus ([place])   [place] Eulogio Bear, NP

## 2021-06-10 ENCOUNTER — Other Ambulatory Visit: Payer: Self-pay

## 2021-06-10 ENCOUNTER — Ambulatory Visit (INDEPENDENT_AMBULATORY_CARE_PROVIDER_SITE_OTHER): Payer: Medicare Other | Admitting: Nurse Practitioner

## 2021-06-10 ENCOUNTER — Encounter: Payer: Self-pay | Admitting: Nurse Practitioner

## 2021-06-10 VITALS — BP 123/78 | HR 77 | Temp 97.9°F | Ht 68.9 in | Wt 174.8 lb

## 2021-06-10 DIAGNOSIS — R300 Dysuria: Secondary | ICD-10-CM

## 2021-06-10 DIAGNOSIS — I10 Essential (primary) hypertension: Secondary | ICD-10-CM | POA: Diagnosis not present

## 2021-06-10 DIAGNOSIS — N3001 Acute cystitis with hematuria: Secondary | ICD-10-CM | POA: Diagnosis not present

## 2021-06-10 LAB — URINALYSIS, ROUTINE W REFLEX MICROSCOPIC
Bilirubin, UA: NEGATIVE
Glucose, UA: NEGATIVE
Ketones, UA: NEGATIVE
Nitrite, UA: NEGATIVE
Specific Gravity, UA: 1.015 (ref 1.005–1.030)
Urobilinogen, Ur: 0.2 mg/dL (ref 0.2–1.0)
pH, UA: 5 (ref 5.0–7.5)

## 2021-06-10 LAB — MICROSCOPIC EXAMINATION: Epithelial Cells (non renal): NONE SEEN /hpf (ref 0–10)

## 2021-06-10 MED ORDER — AMOXICILLIN-POT CLAVULANATE 875-125 MG PO TABS: 1.0000 | ORAL_TABLET | Freq: Two times a day (BID) | ORAL | 0 refills | Status: DC

## 2021-06-10 NOTE — Assessment & Plan Note (Signed)
Chronic, stable. Blood pressure today 123/78. He states that sometimes his blood pressure goes up at home into the 150s and he would like his fluid pill as needed for if his blood pressure is elevated. His blood pressure has been in the 120s/70s in the office which is why the HCTZ was discontinued. With blood pressure being well controlled today, will hold off on HCTZ. Encouraged him to maintain a low sodium diet and to check his blood pressure daily and write it down. Follow up in 4 weeks to review blood pressures.

## 2021-06-10 NOTE — Progress Notes (Signed)
Acute Office Visit  Subjective:    Patient ID: Rick [name], male    DOB: 1941/01/18, 80 y.o.   MRN: [id number]  Chief Complaint  Patient presents with   Cloudy urine    With pain upon urination.    HPI Patient is in today for dysuria and cloudy urine.   URINARY SYMPTOMS  Dysuria: burning Urinary frequency: yes Urgency: yes Small volume voids: yes Symptom severity:  moderate Urinary incontinence: no Foul odor:  sometimes Hematuria: no Abdominal pain: no Back pain: no Suprapubic pain/pressure: no Flank pain: no Fever:  no Vomiting: no Relief with cranberry juice:  n/a Relief with pyridium:  n/a Status: better/worse/stable Previous urinary tract infection: yes Recurrent urinary tract infection: yes Treatments attempted: none    Past Medical History:  Diagnosis Date   Anxiety    Bipolar disorder (HCC)    Bladder cancer (HCC)    BPH (benign prostatic hypertrophy)    Chest pain, atypical 06/23/2015   Closed trimalleolar fracture of left ankle 10/01/2015   Cystitis    hx of    Depression    Diabetes mellitus without complication ([place])    type 2    Difficult or painful urination 10/31/2012   Dysrhythmia    Ear problem 03/24/2015   Gangrenous appendicitis    H/O urinary disorder 03/27/2013   Herpes genitalis in men    Hyperlipidemia    Hypertension    Insomnia    Kidney stones    Left hand pain 05/18/2015   Mass of arm 02/10/2015   Peripheral neuropathy    Skin cancer    UD (urethral discharge) 10/31/2012   Urinary tract infection    hx of     Past Surgical History:  Procedure Laterality Date   APPENDECTOMY     RUPTURED   BLADDER SURGERY     CHOLECYSTECTOMY     COLOSTOMY     AND LATER CLOSURE   CYSTOSCOPY/URETEROSCOPY/HOLMIUM LASER/STENT PLACEMENT Left 03/22/2018   Procedure: CYSTOSCOPY/LEFT URETEROSCOPY/LEFT RETROGRADE PYELOGRAM;  Surgeon: Lucas Mallow, MD;  Location: [facility];  Service: Urology;  Laterality: Left;   EPIDIDYMECTOMY N/A  01/03/2019   Procedure: RIGHT EPIDIDYMAL CYST REMOVAL VERSES EPIDIDMECTOMY;  Surgeon: Lucas Mallow, MD;  Location: [facility];  Service: Urology;  Laterality: N/A;   FRACTURE SURGERY     HERNIA REPAIR     INNER EAR SURGERY Left    ORIF ANKLE FRACTURE Left 09/27/2015   Procedure: OPEN REDUCTION INTERNAL FIXATION (ORIF) ANKLE FRACTURE;  Surgeon: Corky Mull, MD;  Location: [facility];  Service: Orthopedics;  Laterality: Left;   SKIN CANCER EXCISION     TRANSURETHRAL RESECTION OF PROSTATE N/A 03/22/2018   Procedure: TRANSURETHRAL RESECTION OF THE PROSTATE (TURP);  Surgeon: Lucas Mallow, MD;  Location: [facility];  Service: Urology;  Laterality: N/A;   VENTRAL HERNIA REPAIR N/A 01/14/2016   Procedure: HERNIA REPAIR VENTRAL ADULT;  Surgeon: Leonie Green, MD;  Location: [facility];  Service: General;  Laterality: N/A;    Family History  Problem Relation Age of Onset   Transient ischemic attack Mother    Diabetes Father    Cancer Brother     Social History   Socioeconomic History   Marital status: Divorced    Spouse name: Not on file   Number of children: Not on file   Years of education: Not on file   Highest education level: Not on file  Occupational History   Not on file  Tobacco Use   Smoking status: Former    Packs/day: 1.50    Years: 15.00    Pack years: 22.50    Types: Cigarettes    Start date: 08/14/1994    Quit date: 03/23/2005    Years since quitting: 16.2   Smokeless tobacco: Former    Types: Snuff, Sarina Ser    Quit date: 03/23/2010  Vaping Use   Vaping Use: Never used  Substance and Sexual Activity   Alcohol use: No    Alcohol/week: 0.0 standard drinks   Drug use: No   Sexual activity: Never  Other Topics Concern   Not on file  Social History Narrative   Not on file   Social Determinants of Health   Financial Resource Strain: Not on file  Food Insecurity: Not on file  Transportation Needs: Not on file  Physical Activity: Not on file  Stress: Not on file   Social Connections: Not on file  Intimate Partner Violence: Not on file    Outpatient Medications Prior to Visit  Medication Sig Dispense Refill   BD PEN NEEDLE NANO U/F 32G X 4 MM MISC USE 1 TIME DAILY AS DIRECTED. 100 each 12   Blood Glucose Monitoring Suppl (ONE TOUCH ULTRA SYSTEM KIT) w/Device KIT 1 kit by Does not apply route once. 1 each 0   carbidopa-levodopa (SINEMET IR) 25-100 MG tablet TAKE ONE TABLET BY MOUTH THREE TIMES A DAY 270 tablet 0   carvedilol (COREG) 6.25 MG tablet Take 1 tablet (6.25 mg total) by mouth 2 (two) times daily with a meal. 180 tablet 0   diclofenac sodium (VOLTAREN) 1 % GEL Apply 2 g topically 4 (four) times daily. (Patient taking differently: Apply 2 g topically 4 (four) times daily as needed (lower back pain.).) 100 g 0   enalapril (VASOTEC) 10 MG tablet Take 1 tablet (10 mg total) by mouth daily. 90 tablet 0   fluticasone (FLONASE) 50 MCG/ACT nasal spray PLACE 2 SPRAYS INTO BOTH NOSTRILS 2 TIMES DAILY 16 g 6   gabapentin (NEURONTIN) 300 MG capsule Take 2 capsules (600 mg total) by mouth 2 (two) times daily. (Patient taking differently: Take 600 mg by mouth 2 (two) times daily. Taking as needed) 360 capsule 1   Insulin Pen Needle (ULTICARE PEN NEEDLES) 29G X 12MM MISC USE AS DIRECTED 100 each 12   Lancets (ONETOUCH ULTRASOFT) lancets TEST BLOOD SUGAR THREE TIMES DAILY 100 each 12   lidocaine (LIDODERM) 5 % Place 1 patch onto the skin every 12 (twelve) hours. Remove & Discard patch within 12 hours or as directed by MD 30 patch 0   lovastatin (MEVACOR) 40 MG tablet TAKE 1 TABLET BY MOUTH DAILY. 90 tablet 0   Menthol, Topical Analgesic, (BENGAY EX) Apply 1 application topically 3 (three) times daily as needed (lower back pain.).     metFORMIN (GLUCOPHAGE XR) 500 MG 24 hr tablet Take 2 tablets (1,000 mg total) by mouth in the morning and at bedtime. 360 tablet 0   ONETOUCH ULTRA test strip TEST BLOOD SUGAR THREE TIMES DAILY. 100 each 12   traZODone (DESYREL) 100  MG tablet Take 1 tablet (100 mg total) by mouth at bedtime. 90 tablet 0   valACYclovir (VALTREX) 1000 MG tablet Take 1 tablet (1,000 mg total) by mouth daily as needed. 90 tablet 0   Insulin Glargine (BASAGLAR KWIKPEN) 100 UNIT/ML Inject 10 Units into the skin daily. 9 mL 0   No facility-administered medications prior to visit.    Allergies  Allergen Reactions   Blood-Group Specific Substance    Haloperidol Other (See Comments)    Unknown   Sulfa Antibiotics Other (See Comments)    Unknown    Review of Systems  Constitutional: Negative.   Respiratory: Negative.    Cardiovascular: Negative.   Gastrointestinal: Negative.   Genitourinary:  Positive for dysuria, frequency and urgency. Negative for flank pain.  Musculoskeletal: Negative.   Skin: Negative.   Neurological: Negative.       Objective:    Physical Exam Vitals and nursing note reviewed.  Constitutional:      Appearance: Normal appearance.  HENT:     Head: Normocephalic.  Eyes:     Conjunctiva/sclera: Conjunctivae normal.  Cardiovascular:     Rate and Rhythm: Normal rate and regular rhythm.     Pulses: Normal pulses.     Heart sounds: Normal heart sounds.  Pulmonary:     Effort: Pulmonary effort is normal.     Breath sounds: Normal breath sounds.  Abdominal:     Palpations: Abdomen is soft.     Tenderness: There is no abdominal tenderness. There is no right CVA tenderness or left CVA tenderness.  Musculoskeletal:     Cervical back: Normal range of motion.  Skin:    General: Skin is warm.  Neurological:     General: No focal deficit present.     Mental Status: He is alert and oriented to person, place, and time.  Psychiatric:        Mood and Affect: Mood normal.        Behavior: Behavior normal.        Thought Content: Thought content normal.        Judgment: Judgment normal.    BP 123/78   Pulse 77   Temp 97.9 F (36.6 C) (Oral)   Ht 5' 8.9" (1.75 m)   Wt 174 lb 12.8 oz (79.3 kg)   SpO2 100%    BMI 25.89 kg/m  Wt Readings from Last 3 Encounters:  06/10/21 174 lb 12.8 oz (79.3 kg)  05/02/21 168 lb 9.6 oz (76.5 kg)  03/23/21 171 lb (77.6 kg)    Health Maintenance Due  Topic Date Due   Pneumonia Vaccine 45+ Years old (1 - PCV) Never done   Zoster Vaccines- Shingrix (1 of 2) Never done   COVID-19 Vaccine (3 - Pfizer risk series) 12/01/2019   FOOT EXAM  07/07/2020   INFLUENZA VACCINE  03/14/2021    There are no preventive care reminders to display for this patient.   Lab Results  Component Value Date   TSH 1.890 02/01/2021   Lab Results  Component Value Date   WBC 7.9 02/01/2021   HGB 13.8 02/01/2021   HCT 40.1 02/01/2021   MCV 85 02/01/2021   PLT 261 02/01/2021   Lab Results  Component Value Date   NA 138 02/01/2021   K 3.9 02/01/2021   CO2 18 (L) 02/01/2021   GLUCOSE 121 (H) 02/01/2021   BUN 17 02/01/2021   CREATININE 1.07 02/01/2021   BILITOT 1.1 02/01/2021   ALKPHOS 57 02/01/2021   AST 14 02/01/2021   ALT 12 02/01/2021   PROT 7.3 02/01/2021   ALBUMIN 4.5 02/01/2021   CALCIUM 9.4 02/01/2021   ANIONGAP 7 11/02/2020   EGFR 71 02/01/2021   Lab Results  Component Value Date   CHOL 149 02/01/2021   Lab Results  Component Value Date   HDL 47 02/01/2021   Lab Results  Component Value Date  [place] 79 02/01/2021   Lab Results  Component Value Date   TRIG 127 02/01/2021   Lab Results  Component Value Date   CHOLHDL 2.7 05/07/2018   Lab Results  Component Value Date   HGBA1C 6.2 02/01/2021       Assessment & Plan:   Problem List Items Addressed This Visit       Cardiovascular and Mediastinum   Essential hypertension    Chronic, stable. Blood pressure today 123/78. He states that sometimes his blood pressure goes up at home into the 150s and he would like his fluid pill as needed for if his blood pressure is elevated. His blood pressure has been in the 120s/70s in the office which is why the HCTZ was discontinued. With blood pressure  being well controlled today, will hold off on HCTZ. Encouraged him to maintain a low sodium diet and to check his blood pressure daily and write it down. Follow up in 4 weeks to review blood pressures.       Other Visit Diagnoses     Dysuria    -  Primary   U/A showed few bacteria, 2+ blood, and 3+ leukocytes. Will add culture and treat for UTI   Relevant Orders   Urinalysis, Routine w reflex microscopic (Completed)   Acute cystitis with hematuria       Based on last urine culture, will treat with augmentin x7 days. Send urine for culture. Encouraged him to reach out to Dr. Gloriann Loan (urology) to schedule appt   Relevant Orders   Urine Culture        Meds ordered this encounter  Medications   amoxicillin-clavulanate (AUGMENTIN) 875-125 MG tablet    Sig: Take 1 tablet by mouth 2 (two) times daily.    Dispense:  14 tablet    Refill:  0      Charyl Dancer, NP

## 2021-06-14 ENCOUNTER — Telehealth: Payer: Self-pay

## 2021-06-14 LAB — URINE CULTURE

## 2021-06-14 NOTE — Chronic Care Management (AMB) (Signed)
    Chronic Care Management Pharmacy Assistant   Name: TREVIOUS RAMPEY  MRN: [id number] DOB: 1940/09/05  Reason for Encounter: Patient Assistance application renewal for Basaglar     Medications: Outpatient Encounter Medications as of 06/14/2021  Medication Sig   amoxicillin-clavulanate (AUGMENTIN) 875-125 MG tablet Take 1 tablet by mouth 2 (two) times daily.   BD PEN NEEDLE NANO U/F 32G X 4 MM MISC USE 1 TIME DAILY AS DIRECTED.   Blood Glucose Monitoring Suppl (ONE TOUCH ULTRA SYSTEM KIT) w/Device KIT 1 kit by Does not apply route once.   carbidopa-levodopa (SINEMET IR) 25-100 MG tablet TAKE ONE TABLET BY MOUTH THREE TIMES A DAY   carvedilol (COREG) 6.25 MG tablet Take 1 tablet (6.25 mg total) by mouth 2 (two) times daily with a meal.   diclofenac sodium (VOLTAREN) 1 % GEL Apply 2 g topically 4 (four) times daily. (Patient taking differently: Apply 2 g topically 4 (four) times daily as needed (lower back pain.).)   enalapril (VASOTEC) 10 MG tablet Take 1 tablet (10 mg total) by mouth daily.   fluticasone (FLONASE) 50 MCG/ACT nasal spray PLACE 2 SPRAYS INTO BOTH NOSTRILS 2 TIMES DAILY   gabapentin (NEURONTIN) 300 MG capsule Take 2 capsules (600 mg total) by mouth 2 (two) times daily. (Patient taking differently: Take 600 mg by mouth 2 (two) times daily. Taking as needed)   Insulin Glargine (BASAGLAR KWIKPEN) 100 UNIT/ML Inject 10 Units into the skin daily.   Insulin Pen Needle (ULTICARE PEN NEEDLES) 29G X 12MM MISC USE AS DIRECTED   Lancets (ONETOUCH ULTRASOFT) lancets TEST BLOOD SUGAR THREE TIMES DAILY   lidocaine (LIDODERM) 5 % Place 1 patch onto the skin every 12 (twelve) hours. Remove & Discard patch within 12 hours or as directed by MD   lovastatin (MEVACOR) 40 MG tablet TAKE 1 TABLET BY MOUTH DAILY.   Menthol, Topical Analgesic, (BENGAY EX) Apply 1 application topically 3 (three) times daily as needed (lower back pain.).   metFORMIN (GLUCOPHAGE XR) 500 MG 24 hr tablet Take 2 tablets  (1,000 mg total) by mouth in the morning and at bedtime.   ONETOUCH ULTRA test strip TEST BLOOD SUGAR THREE TIMES DAILY.   traZODone (DESYREL) 100 MG tablet Take 1 tablet (100 mg total) by mouth at bedtime.   valACYclovir (VALTREX) 1000 MG tablet Take 1 tablet (1,000 mg total) by mouth daily as needed.   No facility-administered encounter medications on file as of 06/14/2021.    I have prefilled and mailed out the patient assistance application renewal for 2023 on Basaglar. I have communicated to the patient to fill out all highlighted areas, provide income information signature and return to PCP's office for providers signature as well. Patient understood and will complete task.  Corrie Mckusick, [place]

## 2021-06-15 ENCOUNTER — Telehealth: Payer: Self-pay | Admitting: *Deleted

## 2021-06-15 NOTE — Telephone Encounter (Signed)
Patient returned call for lab results and advised due to negative result:  Please let Rick [name] know that his urine culture shows he does not have a UTI. He can stop his antibiotics.

## 2021-06-17 ENCOUNTER — Telehealth: Payer: Self-pay | Admitting: Family Medicine

## 2021-06-17 NOTE — Telephone Encounter (Signed)
Pt is calling regarding his the application for reduced cost of his Insulin Glargine [facility] KWIKPEN) 100 UNIT/ML [[id number]]  ENDED  CB- [phone]

## 2021-06-20 ENCOUNTER — Other Ambulatory Visit: Payer: Self-pay | Admitting: Family Medicine

## 2021-06-20 NOTE — Telephone Encounter (Signed)
Requested Prescriptions  Pending Prescriptions Disp Refills  . metFORMIN (GLUCOPHAGE-XR) 500 MG 24 hr tablet [Pharmacy Med Name: METFORMIN HCL ER 500 MG TAB] 360 tablet 1    Sig: TAKE 2 TABLETS BY MOUTH IN THE MORNING AND AT BEDTIME     Endocrinology:  Diabetes - Biguanides Passed - 06/20/2021  9:49 AM      Passed - Cr in normal range and within 360 days    Creatinine  Date Value Ref Range Status  11/28/2012 0.94 0.60 - 1.30 mg/dL Final   Creatinine, Ser  Date Value Ref Range Status  02/01/2021 1.07 0.76 - 1.27 mg/dL Final         Passed - HBA1C is between 0 and 7.9 and within 180 days    Hemoglobin A1C  Date Value Ref Range Status  01/05/2016 6.3  Final   HB A1C (BAYER DCA - WAIVED)  Date Value Ref Range Status  02/01/2021 6.2 <7.0 % Final    Comment:                                          Diabetic Adult            <7.0                                       Healthy Adult        4.3 - 5.7                                                           (DCCT/NGSP) American Diabetes Association's Summary of Glycemic Recommendations for Adults with Diabetes: Hemoglobin A1c <7.0%. More stringent glycemic goals (A1c <6.0%) may further reduce complications at the cost of increased risk of hypoglycemia.          Passed - eGFR in normal range and within 360 days    EGFR (African American)  Date Value Ref Range Status  11/28/2012 >60  Final   GFR calc Af Amer  Date Value Ref Range Status  05/27/2020 89 >59 mL/min/1.73 Final    Comment:    **In accordance with recommendations from the NKF-ASN Task force,**   [facility] is in the process of updating its eGFR calculation to the   2021 CKD-EPI creatinine equation that estimates kidney function   without a race variable.    EGFR (Non-African Amer.)  Date Value Ref Range Status  11/28/2012 >60  Final    Comment:    eGFR values <35m/min/1.73 m2 may be an indication of chronic kidney disease (CKD). Calculated eGFR is useful in  patients with stable renal function. The eGFR calculation will not be reliable in acutely ill patients when serum creatinine is changing rapidly. It is not useful in  patients on dialysis. The eGFR calculation may not be applicable to patients at the low and high extremes of body sizes, pregnant women, and vegetarians.    GFR, Estimated  Date Value Ref Range Status  11/02/2020 >60 >60 mL/min Final    Comment:    (NOTE) Calculated using the CKD-EPI Creatinine Equation (2021)    eGFR  Date Value  Ref Range Status  02/01/2021 71 >59 mL/min/1.73 Final         Passed - Valid encounter within last 6 months    Recent Outpatient Visits          1 week ago [place], Lauren A, NP   1 month ago Recurrent UTI   [place], NP   2 months ago Recurrent UTI   [facility], Megan P, DO   4 months ago Essential hypertension   [place], Megan P, DO   8 months ago Cellulitis of finger of left hand   [facility], Megan P, DO             . lovastatin (MEVACOR) 40 MG tablet [Pharmacy Med Name: LOVASTATIN 40 MG TAB] 90 tablet 1    Sig: TAKE 1 TABLET BY MOUTH DAILY     Cardiovascular:  Antilipid - Statins Passed - 06/20/2021  9:49 AM      Passed - Total Cholesterol in normal range and within 360 days    Cholesterol, Total  Date Value Ref Range Status  02/01/2021 149 100 - 199 mg/dL Final   Cholesterol Piccolo, Waived  Date Value Ref Range Status  06/06/2016 146 <200 mg/dL Final    Comment:                            Desirable                <200                         Borderline High      200- 239                         High                     >239          Passed - LDL in normal range and within 360 days    LDL Chol Calc (NIH)  Date Value Ref Range Status  02/01/2021 79 0 - 99 mg/dL Final         Passed - HDL in normal range and within 360 days     HDL  Date Value Ref Range Status  02/01/2021 47 >39 mg/dL Final         Passed - Triglycerides in normal range and within 360 days    Triglycerides  Date Value Ref Range Status  02/01/2021 127 0 - 149 mg/dL Final   Triglycerides Piccolo,Waived  Date Value Ref Range Status  06/06/2016 177 (H) <150 mg/dL Final    Comment:                            Normal                   <150                         Borderline High     150 - 199                         High  200 - 57                         Very High                >499          Passed - Patient is not pregnant      Passed - Valid encounter within last 12 months    Recent Outpatient Visits          1 week ago [place], NP   1 month ago Recurrent UTI   [place], NP   2 months ago Recurrent UTI   [facility], [place], DO   4 months ago Essential hypertension   [place], Megan P, DO   8 months ago Cellulitis of finger of left hand   [place], [place], DO

## 2021-07-13 ENCOUNTER — Other Ambulatory Visit: Payer: Self-pay | Admitting: Family Medicine

## 2021-07-13 DIAGNOSIS — I10 Essential (primary) hypertension: Secondary | ICD-10-CM

## 2021-07-14 ENCOUNTER — Other Ambulatory Visit: Payer: Self-pay | Admitting: Family Medicine

## 2021-07-15 NOTE — Telephone Encounter (Signed)
Requested medication (s) are due for refill today: yes  Requested medication (s) are on the active medication list: yes  Last refill:  01/24/21  Future visit scheduled: NO  Notes to clinic:  Wanted to inform you that pt has not filled this medication since June, please assess.      Requested Prescriptions  Pending Prescriptions Disp Refills   carbidopa-levodopa (SINEMET IR) 25-100 MG tablet [Pharmacy Med Name: CARBIDOPA-LEVODOPA 25-100 MG TAB] 270 tablet 0    Sig: TAKE ONE TABLET BY MOUTH THREE TIMES A DAY     Neurology:  Parkinsonian Agents Passed - 07/14/2021  1:10 PM      Passed - Last BP in normal range    BP Readings from Last 1 Encounters:  06/10/21 123/78          Passed - Valid encounter within last 12 months    Recent Outpatient Visits           1 month ago [place], NP   2 months ago Recurrent UTI   [place], NP   3 months ago Recurrent UTI   [facility], Megan P, DO   5 months ago Essential hypertension   [place], Megan P, DO   9 months ago Cellulitis of finger of left hand   [place], [place], DO

## 2021-07-15 NOTE — Telephone Encounter (Signed)
Requested medication (s) are due for refill today: yes  Requested medication (s) are on the active medication list: yes  Last refill:  04/04/21  Future visit scheduled: no  Notes to clinic:  was seen 06/10/21 and asked to return in 4 weeks, no upcoming visit scheduled. Please assess.      Requested Prescriptions  Pending Prescriptions Disp Refills   traZODone (DESYREL) 100 MG tablet [Pharmacy Med Name: TRAZODONE HCL 100 MG TAB] 90 tablet 0    Sig: TAKE 1 TABLET BY MOUTH AT BEDTIME     Psychiatry: Antidepressants - Serotonin Modulator Failed - 07/13/2021  2:23 PM      Failed - Completed PHQ-2 or PHQ-9 in the last 360 days      Passed - Valid encounter within last 6 months    Recent Outpatient Visits           1 month ago [place], Lauren A, NP   2 months ago Recurrent UTI   [place], [place], NP   3 months ago Recurrent UTI   [facility], [place], DO   5 months ago Essential hypertension   [place], Megan P, DO   9 months ago Cellulitis of finger of left hand   [facility], Megan P, DO               carvedilol (COREG) 6.25 MG tablet [Pharmacy Med Name: CARVEDILOL 6.25 MG TAB] 180 tablet 0    Sig: TAKE 1 TABLET BY MOUTH 2 TIMES DAILY WITH A MEAL     Cardiovascular:  Beta Blockers Passed - 07/13/2021  2:23 PM      Passed - Last BP in normal range    BP Readings from Last 1 Encounters:  06/10/21 123/78          Passed - Last Heart Rate in normal range    Pulse Readings from Last 1 Encounters:  06/10/21 77          Passed - Valid encounter within last 6 months    Recent Outpatient Visits           1 month ago [place], Lauren A, NP   2 months ago Recurrent UTI   [place], NP   3 months ago Recurrent UTI   [facility], Megan P, DO   5 months  ago Essential hypertension   [facility] [place], Megan P, DO   9 months ago Cellulitis of finger of left hand   [facility], Megan P, DO               enalapril (VASOTEC) 10 MG tablet [Pharmacy Med Name: ENALAPRIL MALEATE 10 MG TAB] 90 tablet 0    Sig: TAKE 1 TABLET BY MOUTH DAILY     Cardiovascular:  ACE Inhibitors Passed - 07/13/2021  2:23 PM      Passed - Cr in normal range and within 180 days    Creatinine  Date Value Ref Range Status  11/28/2012 0.94 0.60 - 1.30 mg/dL Final   Creatinine, Ser  Date Value Ref Range Status  02/01/2021 1.07 0.76 - 1.27 mg/dL Final          Passed - K in normal range and within 180 days    Potassium  Date Value Ref Range Status  02/01/2021 3.9 3.5 - 5.2 mmol/L Final  11/28/2012 4.1 3.5 - 5.1 mmol/L Final          Passed - Patient is not pregnant      Passed - Last BP in normal range    BP Readings from Last 1 Encounters:  06/10/21 123/78          Passed - Valid encounter within last 6 months    Recent Outpatient Visits           1 month ago [place], NP   2 months ago Recurrent UTI   [place], NP   3 months ago Recurrent UTI   [facility] [place], [place], DO   5 months ago Essential hypertension   [place], Megan P, DO   9 months ago Cellulitis of finger of left hand   [place], [place], DO

## 2021-07-18 ENCOUNTER — Other Ambulatory Visit: Payer: Self-pay | Admitting: Nurse Practitioner

## 2021-07-18 NOTE — Telephone Encounter (Signed)
Requested Prescriptions  Pending Prescriptions Disp Refills  . carbidopa-levodopa (SINEMET IR) 25-100 MG tablet [Pharmacy Med Name: CARBIDOPA-LEVODOPA 25-100 MG TAB] 270 tablet 0    Sig: TAKE ONE TABLET BY MOUTH THREE TIMES A DAY     Neurology:  Parkinsonian Agents Passed - 07/18/2021 10:19 AM      Passed - Last BP in normal range    BP Readings from Last 1 Encounters:  06/10/21 123/78         Passed - Valid encounter within last 12 months    Recent Outpatient Visits          1 month ago [place], NP   2 months ago Recurrent UTI   [place], NP   3 months ago Recurrent UTI   [facility], Megan P, DO   5 months ago Essential hypertension   [place], Megan P, DO   9 months ago Cellulitis of finger of left hand   [place], [place], DO

## 2021-07-20 ENCOUNTER — Ambulatory Visit: Payer: Self-pay | Admitting: *Deleted

## 2021-07-20 NOTE — Telephone Encounter (Signed)
Per agent:  "Drew, from pharmacy, calling in stating that the pt is requesting to have a refill on Voltaren Gel. He states that the pt has not taken this medication in a while and is requesting now to have 3 tubes of it. Please contact pt and advise."  Spoke with pt. States he is about out of Voltaren Gel for his arthritis. States pain of hands and lower back, ongoing. Denies redness, warmth. States he is about out "And I would like 3 tubes called in for prescriptions. It costs more over the counter." Also requesting "Anything else that can be used for arthritis. Left hand the worse."  States can not afford OTC meds, "Prescriptions cheaper."  Also questioning if he has any labs due. Advised pt appt, declines "Only if I have to. See if I can get something called in and the gel, an open prescription since I use so much." Assured pt NT would route to practice for PCPs review and final disposition. Pt verbalizes understanding.   Reason for Disposition  [1] Pharmacy calling with prescription question AND [2] triager unable to answer question  Answer Assessment - Initial Assessment Questions 1. NAME of MEDICATION: "What medicine are you calling about?"     Voltaran Gel 2. QUESTION: "What is your question?" (e.g., double dose of medicine, side effect)     "I need 3 tubes called in."  3. PRESCRIBING HCP: "Who prescribed it?" Reason: if prescribed by specialist, call should be referred to that group.     *No Answer* 4. SYMPTOMS: "Do you have any symptoms?"     *No Answer* 5. SEVERITY: If symptoms are present, ask "Are they mild, moderate or severe?"     Please see triage summary.  Protocols used: Medication Question Call-A-AH

## 2021-07-20 NOTE — Telephone Encounter (Signed)
Called pt to advise appointment is needed, unable to leave voicemail.  Routed to clinical to advise.

## 2021-07-21 NOTE — Telephone Encounter (Signed)
2nd attempt-unable to leave vm for pt to schedule an appt.

## 2021-07-21 NOTE — Telephone Encounter (Signed)
appt

## 2021-07-22 NOTE — Telephone Encounter (Signed)
Pt scheduled for appt with Dr. Wynetta Emery next week.

## 2021-07-26 ENCOUNTER — Ambulatory Visit (INDEPENDENT_AMBULATORY_CARE_PROVIDER_SITE_OTHER): Payer: Medicare Other | Admitting: Family Medicine

## 2021-07-26 ENCOUNTER — Telehealth: Payer: Self-pay | Admitting: Family Medicine

## 2021-07-26 ENCOUNTER — Encounter: Payer: Self-pay | Admitting: Family Medicine

## 2021-07-26 ENCOUNTER — Other Ambulatory Visit: Payer: Self-pay

## 2021-07-26 ENCOUNTER — Telehealth: Payer: Self-pay

## 2021-07-26 VITALS — BP 126/71 | HR 87 | Temp 97.9°F | Ht 68.9 in | Wt 175.6 lb

## 2021-07-26 DIAGNOSIS — E1142 Type 2 diabetes mellitus with diabetic polyneuropathy: Secondary | ICD-10-CM

## 2021-07-26 DIAGNOSIS — E782 Mixed hyperlipidemia: Secondary | ICD-10-CM

## 2021-07-26 DIAGNOSIS — N390 Urinary tract infection, site not specified: Secondary | ICD-10-CM

## 2021-07-26 DIAGNOSIS — I10 Essential (primary) hypertension: Secondary | ICD-10-CM | POA: Diagnosis not present

## 2021-07-26 LAB — BAYER DCA HB A1C WAIVED: HB A1C (BAYER DCA - WAIVED): 7.1 % — ABNORMAL HIGH (ref 4.8–5.6)

## 2021-07-26 MED ORDER — VALACYCLOVIR HCL 1 G PO TABS: 1000.0000 mg | ORAL_TABLET | Freq: Every day | ORAL | 0 refills | Status: DC | PRN

## 2021-07-26 MED ORDER — BASAGLAR KWIKPEN 100 UNIT/ML ~~LOC~~ SOPN: 10.0000 [IU] | PEN_INJECTOR | Freq: Every day | SUBCUTANEOUS | 0 refills | Status: DC

## 2021-07-26 MED ORDER — LOVASTATIN 40 MG PO TABS: 40.0000 mg | ORAL_TABLET | Freq: Every day | ORAL | 1 refills | Status: DC

## 2021-07-26 MED ORDER — ENALAPRIL MALEATE 10 MG PO TABS: 10.0000 mg | ORAL_TABLET | Freq: Every day | ORAL | 1 refills | Status: DC

## 2021-07-26 MED ORDER — CARVEDILOL 6.25 MG PO TABS: 6.2500 mg | ORAL_TABLET | Freq: Two times a day (BID) | ORAL | 1 refills | Status: DC

## 2021-07-26 MED ORDER — GABAPENTIN 300 MG PO CAPS: 600.0000 mg | ORAL_CAPSULE | Freq: Two times a day (BID) | ORAL | 1 refills | Status: DC

## 2021-07-26 MED ORDER — TRAZODONE HCL 100 MG PO TABS
100.0000 mg | ORAL_TABLET | Freq: Every day | ORAL | 1 refills | Status: DC
Start: 2021-07-26 — End: 2022-03-15

## 2021-07-26 MED ORDER — METFORMIN HCL ER 500 MG PO TB24: ORAL_TABLET | ORAL | 1 refills | Status: DC

## 2021-07-26 NOTE — Assessment & Plan Note (Signed)
Doing well with A1c of 7.1. Continue current regimen. Continue to monitor. Call with any concerns.

## 2021-07-26 NOTE — Progress Notes (Signed)
BP 126/71   Pulse 87   Temp 97.9 F (36.6 C) (Oral)   Ht 5' 8.9" (1.75 m)   Wt 175 lb 9.6 oz (79.7 kg)   SpO2 97%   BMI 26.01 kg/m    Subjective:    Patient ID: Rick [name], male    DOB: Mar 24, 1941, 80 y.o.   MRN: [id number]  HPI: Rick [name] is a 80 y.o. male  Chief Complaint  Patient presents with   Diabetes   Hyperlipidemia   Hypertension   DIABETES Hypoglycemic episodes:no Polydipsia/polyuria: no Visual disturbance: no Chest pain: no Paresthesias: no Glucose Monitoring: no Taking Insulin?: yes Blood Pressure Monitoring: a few times a month Retinal Examination: Up to Date Foot Exam: Up to Date Diabetic Education: Completed Pneumovax: Up to Date Influenza:Not Up to Date Aspirin: yes  HYPERTENSION / HYPERLIPIDEMIA Satisfied with current treatment? yes Duration of hypertension: chronic BP monitoring frequency: not checking BP medication side effects: no Duration of hyperlipidemia: chronic Cholesterol medication side effects: no Cholesterol supplements: none Medication compliance: excellent compliance Aspirin: yes Recent stressors: yes Recurrent headaches: no Visual changes: no Palpitations: no Dyspnea: no Chest pain: no Lower extremity edema: no Dizzy/lightheaded: no  Relevant past medical, surgical, family and social history reviewed and updated as indicated. Interim medical history since our last visit reviewed. Allergies and medications reviewed and updated.  Review of Systems  Constitutional: Negative.   Respiratory: Negative.    Cardiovascular: Negative.   Gastrointestinal: Negative.   Musculoskeletal: Negative.   Neurological: Negative.   Psychiatric/Behavioral: Negative.     Per HPI unless specifically indicated above     Objective:    BP 126/71   Pulse 87   Temp 97.9 F (36.6 C) (Oral)   Ht 5' 8.9" (1.75 m)   Wt 175 lb 9.6 oz (79.7 kg)   SpO2 97%   BMI 26.01 kg/m   Wt Readings from Last 3 Encounters:  07/26/21 175  lb 9.6 oz (79.7 kg)  06/10/21 174 lb 12.8 oz (79.3 kg)  05/02/21 168 lb 9.6 oz (76.5 kg)    Physical Exam Vitals and nursing note reviewed.  Constitutional:      General: He is not in acute distress.    Appearance: Normal appearance. He is not ill-appearing, toxic-appearing or diaphoretic.  HENT:     Head: Normocephalic and atraumatic.     Right Ear: External ear normal.     Left Ear: External ear normal.     Nose: Nose normal.     Mouth/Throat:     Mouth: Mucous membranes are moist.     Pharynx: Oropharynx is clear.  Eyes:     General: No scleral icterus.       Right eye: No discharge.        Left eye: No discharge.     Extraocular Movements: Extraocular movements intact.     Conjunctiva/sclera: Conjunctivae normal.     Pupils: Pupils are equal, round, and reactive to light.  Cardiovascular:     Rate and Rhythm: Normal rate and regular rhythm.     Pulses: Normal pulses.     Heart sounds: Normal heart sounds. No murmur heard.   No friction rub. No gallop.  Pulmonary:     Effort: Pulmonary effort is normal. No respiratory distress.     Breath sounds: Normal breath sounds. No stridor. No wheezing, rhonchi or rales.  Chest:     Chest wall: No tenderness.  Musculoskeletal:        General: Normal  range of motion.     Cervical back: Normal range of motion and neck supple.  Skin:    General: Skin is warm and dry.     Capillary Refill: Capillary refill takes less than 2 seconds.     Coloration: Skin is not jaundiced or pale.     Findings: No bruising, erythema, lesion or rash.  Neurological:     General: No focal deficit present.     Mental Status: He is alert and oriented to person, place, and time. Mental status is at baseline.  Psychiatric:        Mood and Affect: Mood normal.        Behavior: Behavior normal.        Thought Content: Thought content normal.        Judgment: Judgment normal.    Results for orders placed or performed in visit on 06/10/21  Urine Culture    Specimen: Urine   UR  Result Value Ref Range   Urine Culture, Routine Final report    Organism ID, Bacteria Comment   Microscopic Examination   Urine  Result Value Ref Range   WBC, UA 11-30 (A) 0 - 5 /hpf   RBC 3-10 (A) 0 - 2 /hpf   Epithelial Cells (non renal) None seen 0 - 10 /hpf   Mucus, UA Present (A) Not Estab.   Bacteria, UA Few (A) None seen/Few  Urinalysis, Routine w reflex microscopic  Result Value Ref Range   Specific Gravity, UA 1.015 1.005 - 1.030   pH, UA 5.0 5.0 - 7.5   Color, UA Yellow Yellow   Appearance Ur Cloudy (A) Clear   Leukocytes,UA 3+ (A) Negative   Protein,UA Trace (A) Negative/Trace   Glucose, UA Negative Negative   Ketones, UA Negative Negative   RBC, UA 2+ (A) Negative   Bilirubin, UA Negative Negative   Urobilinogen, Ur 0.2 0.2 - 1.0 mg/dL   Nitrite, UA Negative Negative   Microscopic Examination See below:       Assessment & Plan:   Problem List Items Addressed This Visit       Cardiovascular and Mediastinum   Essential hypertension - Primary    Under good control on current regimen. Continue current regimen. Continue to monitor. Call with any concerns. Refills given. Labs drawn today.        Relevant Medications   carvedilol (COREG) 6.25 MG tablet   enalapril (VASOTEC) 10 MG tablet   lovastatin (MEVACOR) 40 MG tablet   Other Relevant Orders   CBC with Differential/Platelet   Comprehensive metabolic panel   TSH     Endocrine   Diabetic peripheral neuropathy associated with type 2 diabetes mellitus ([place])    Doing well with A1c of 7.1. Continue current regimen. Continue to monitor. Call with any concerns.       Relevant Medications   enalapril (VASOTEC) 10 MG tablet   lovastatin (MEVACOR) 40 MG tablet   metFORMIN (GLUCOPHAGE-XR) 500 MG 24 hr tablet   traZODone (DESYREL) 100 MG tablet   Insulin Glargine (BASAGLAR KWIKPEN) 100 UNIT/ML   gabapentin (NEURONTIN) 300 MG capsule   Other Relevant Orders   CBC with  Differential/Platelet   Bayer DCA Hb A1c Waived   Comprehensive metabolic panel   Microalbumin, Urine Waived     Genitourinary   Recurrent UTI    Under good control on current regimen. Continue current regimen. Continue to monitor. Call with any concerns. Refills given. Labs drawn today.  Relevant Medications   valACYclovir (VALTREX) 1000 MG tablet   Other Relevant Orders   CBC with Differential/Platelet   Comprehensive metabolic panel   Urinalysis, Routine w reflex microscopic     Other   Hyperlipidemia    Under good control on current regimen. Continue current regimen. Continue to monitor. Call with any concerns. Refills given. Labs drawn today.       Relevant Medications   carvedilol (COREG) 6.25 MG tablet   enalapril (VASOTEC) 10 MG tablet   lovastatin (MEVACOR) 40 MG tablet   Other Relevant Orders   CBC with Differential/Platelet   Comprehensive metabolic panel   Lipid Panel w/o Chol/HDL Ratio     Follow up plan: Return in about 3 months (around 10/24/2021).

## 2021-07-26 NOTE — Assessment & Plan Note (Signed)
Under good control on current regimen. Continue current regimen. Continue to monitor. Call with any concerns. Refills given. Labs drawn today.   

## 2021-07-26 NOTE — Telephone Encounter (Deleted)
Pt is inquiring on receiving assistance in completing forms for [facility].  Pt has provided proof of income but upon reviewing, we have not received a fax with the forms from [facility].  Pt has requested to be contacted at [phone] as soon as possible because he needs assistance with his insulin medication.

## 2021-07-26 NOTE — Telephone Encounter (Signed)
Contacted Pharmacist assistant-Shonte to assist.

## 2021-07-26 NOTE — Progress Notes (Signed)
I have call [name] to check on the status of the Basaglar PAP. The company has not received any application from [place] facility. It will need to be faxed if possible. Since the patient was instructed to take the form to PCP office for signature.  Corrie Mckusick, [place]

## 2021-07-26 NOTE — Telephone Encounter (Signed)
Pt called back, has questions about his Fax that was submitted from [place]. He wants the office to highlight whatever he needs to complete and mail it to him. Requesting a call back from the office  Best contact: [phone]

## 2021-07-27 LAB — COMPREHENSIVE METABOLIC PANEL
ALT: 19 IU/L (ref 0–44)
AST: 24 IU/L (ref 0–40)
Albumin/Globulin Ratio: 1.6 (ref 1.2–2.2)
Albumin: 4.4 g/dL (ref 3.7–4.7)
Alkaline Phosphatase: 57 IU/L (ref 44–121)
BUN/Creatinine Ratio: 18 (ref 10–24)
BUN: 21 mg/dL (ref 8–27)
Bilirubin Total: 0.8 mg/dL (ref 0.0–1.2)
CO2: 23 mmol/L (ref 20–29)
Calcium: 9.3 mg/dL (ref 8.6–10.2)
Chloride: 97 mmol/L (ref 96–106)
Creatinine, Ser: 1.15 mg/dL (ref 0.76–1.27)
Globulin, Total: 2.7 g/dL (ref 1.5–4.5)
Glucose: 151 mg/dL — ABNORMAL HIGH (ref 70–99)
Potassium: 3.8 mmol/L (ref 3.5–5.2)
Sodium: 137 mmol/L (ref 134–144)
Total Protein: 7.1 g/dL (ref 6.0–8.5)
eGFR: 65 mL/min/{1.73_m2} (ref >59)

## 2021-07-27 LAB — CBC WITH DIFFERENTIAL/PLATELET
Basophils Absolute: 0.1 10*3/uL (ref 0.0–0.2)
Basos: 1 %
EOS (ABSOLUTE): 0.3 10*3/uL (ref 0.0–0.4)
Eos: 5 %
Hematocrit: 44.8 % (ref 37.5–51.0)
Hemoglobin: 14.8 g/dL (ref 13.0–17.7)
Immature Grans (Abs): 0 10*3/uL (ref 0.0–0.1)
Immature Granulocytes: 0 %
Lymphocytes Absolute: 2.1 10*3/uL (ref 0.7–3.1)
Lymphs: 29 %
MCH: 28.5 pg (ref 26.6–33.0)
MCHC: 33 g/dL (ref 31.5–35.7)
MCV: 86 fL (ref 79–97)
Monocytes Absolute: 0.7 10*3/uL (ref 0.1–0.9)
Monocytes: 10 %
Neutrophils Absolute: 3.9 10*3/uL (ref 1.4–7.0)
Neutrophils: 55 %
Platelets: 269 10*3/uL (ref 150–450)
RBC: 5.2 x10E6/uL (ref 4.14–5.80)
RDW: 13.4 % (ref 11.6–15.4)
WBC: 7.1 10*3/uL (ref 3.4–10.8)

## 2021-07-27 LAB — LIPID PANEL W/O CHOL/HDL RATIO
Cholesterol, Total: 179 mg/dL (ref 100–199)
HDL: 45 mg/dL (ref >39)
LDL Chol Calc (NIH): 109 mg/dL — ABNORMAL HIGH (ref 0–99)
Triglycerides: 142 mg/dL (ref 0–149)
VLDL Cholesterol Cal: 25 mg/dL (ref 5–40)

## 2021-07-27 LAB — TSH: TSH: 4.02 u[IU]/mL (ref 0.450–4.500)

## 2021-07-28 ENCOUNTER — Ambulatory Visit: Payer: Self-pay | Admitting: *Deleted

## 2021-07-28 ENCOUNTER — Telehealth: Payer: Self-pay

## 2021-07-28 NOTE — Telephone Encounter (Signed)
Answer Assessment - Initial Assessment Questions 1. BLOOD GLUCOSE: "What is your blood glucose level?"      Pt called in and requesting I look up his A1C from 5 months ago.   It was 6.2 then.    Dr. Wynetta Emery told me I was doing so good..   Dr. Wynetta Emery adjusted my insulin.  3 days ago my A1C 7.1 now.   Dr. Wynetta Emery told me that's what it was. This morning my glucose 230 after 1 cup of coffee.   I took 10 units of insulin too and my metformin.    I ate 2 regular biscuits.   I think I should up my insulin back to 15 units.   Dr. Wynetta Emery has gotten everything all messed up.   If she would have left things alone 5 months ago this would not have happened.    I'm not going to argue with her.   It's never been 7 before.   If I have to go to another practice because she won't work this out.   7 is not acceptable to me.   It's never been that high before.     He is insisting I look and see when he started seeing Dr. Wynetta Emery.  I called into [facility] because he was arguing with me and was not allowing me to make him an appt.   They also said he would need an appt to talk with Dr. Wynetta Emery about all of this.   So I reconnected with the pt.  I'm not going to argue with her about this.   I'll go to another dr.   He continued to ask if she can take care of this.    I'm not coming over there and getting into an argument with Dr. Wynetta Emery.    I know my body.   I have to fight for what is right for me.    He also asked about a form for free insulin.   I let him know he would need to discuss that with Dr Wynetta Emery.     After several minutes of trying to reason with him I was able to make an appt with Dr. Neomia Dear for 07/29/2021 at 9:20 in office visit.   Dr. Wynetta Emery didn't have anything next [facility]   He didn't want to wait that long so he was willing to see someone else.  I was never able to triage him for elevated blood glucose that he told the agent he was calling about     I sent these notes to [facility] 2. ONSET: "When did you check the blood glucose?"     *No Answer* 3. USUAL RANGE: "What is your glucose level usually?" (e.g., usual fasting morning value, usual evening value)     *No Answer* 4. KETONES: "Do you check for ketones (urine or blood test strips)?" If yes, ask: "What does the test show now?"      *No Answer* 5. TYPE 1 or 2:  "Do you know what type of diabetes you have?"  (e.g., Type 1, Type 2, Gestational; doesn't know)      *No Answer* 6. INSULIN: "Do you take insulin?" "What type of insulin(s) do you use? What is the mode of delivery? (syringe, pen; injection or pump)?"      *No Answer* 7. DIABETES PILLS: "Do you take any pills for your diabetes?" If yes, ask: "Have you missed taking any pills recently?"     *No Answer* 8.  OTHER SYMPTOMS: "Do you have any symptoms?" (e.g., fever, frequent urination, difficulty breathing, dizziness, weakness, vomiting)     *No Answer* 9. PREGNANCY: "Is there any chance you are pregnant?" "When was your last menstrual period?"     *No Answer*  Protocols used: Diabetes - High Blood Sugar-A-AH

## 2021-07-28 NOTE — Telephone Encounter (Signed)
See triage notes.   I was never able to triage this pt due to him arguing with me.

## 2021-07-28 NOTE — Telephone Encounter (Signed)
Patient has spoken to triage nurse today- see notes

## 2021-07-28 NOTE — Telephone Encounter (Signed)
Attempted to call patient no answer

## 2021-07-28 NOTE — Telephone Encounter (Signed)
Patient called in stated that he needed to speak to the Pharmacist say that he have run into some major issues but did not elaborate just that his like could be in jeopardy. I am not sure what it is but if a nurse can call him may be able to get some information out of him. Please call Ph# [phone]   Attempted to call patient- no answer.

## 2021-07-29 ENCOUNTER — Other Ambulatory Visit: Payer: Self-pay | Admitting: Internal Medicine

## 2021-07-29 ENCOUNTER — Other Ambulatory Visit: Payer: Self-pay

## 2021-07-29 ENCOUNTER — Ambulatory Visit (INDEPENDENT_AMBULATORY_CARE_PROVIDER_SITE_OTHER): Payer: Medicare Other | Admitting: Internal Medicine

## 2021-07-29 ENCOUNTER — Encounter: Payer: Self-pay | Admitting: Family Medicine

## 2021-07-29 ENCOUNTER — Encounter: Payer: Self-pay | Admitting: Internal Medicine

## 2021-07-29 VITALS — BP 125/78 | HR 91 | Temp 97.9°F | Ht 68.9 in | Wt 175.2 lb

## 2021-07-29 DIAGNOSIS — I10 Essential (primary) hypertension: Secondary | ICD-10-CM

## 2021-07-29 DIAGNOSIS — E119 Type 2 diabetes mellitus without complications: Secondary | ICD-10-CM | POA: Insufficient documentation

## 2021-07-29 MED ORDER — BASAGLAR KWIKPEN 100 UNIT/ML ~~LOC~~ SOPN: 15.0000 [IU] | PEN_INJECTOR | Freq: Every day | SUBCUTANEOUS | 0 refills | Status: DC

## 2021-07-29 MED ORDER — HYDROCHLOROTHIAZIDE 12.5 MG PO TABS: 12.5000 mg | ORAL_TABLET | Freq: Every day | ORAL | 0 refills | Status: DC

## 2021-07-29 NOTE — Telephone Encounter (Signed)
Patient stopped in office to request the status of [name] Application.  Advised patient that we had not received an application. Patient left a copy of income verification and it was placed in file for pharmacist. Geralyn Corwin and was instructed to print application from website for patient to complete. Printed application and mailed to patient 07/29/21.

## 2021-07-29 NOTE — Progress Notes (Signed)
BP 125/78    Pulse 91    Temp 97.9 F (36.6 C) (Oral)    Ht 5' 8.9" (1.75 m)    Wt 175 lb 3.2 oz (79.5 kg)    SpO2 97%    BMI 25.95 kg/m    Subjective:    Patient ID: Rick [name], male    DOB: 09/26/1940, 80 y.o.   MRN: [id number]  Chief Complaint  Patient presents with   Medication Problem    Wants to talk about insulin, and wants a refill of HCTZ that he states Dr. Lenna Sciara stopped    Headache    HPI: Rick [name] is a 80 y.o. male  Headache   Diabetes He presents for his follow-up (was on 20 units of insulin was decreased to 15 untis x 2 yrs ago.) diabetic visit. Hypoglycemia symptoms include headaches.   Chief Complaint  Patient presents with   Medication Problem    Wants to talk about insulin, and wants a refill of HCTZ that he states Dr. Lenna Sciara stopped    Headache    Relevant past medical, surgical, family and social history reviewed and updated as indicated. Interim medical history since our last visit reviewed. Allergies and medications reviewed and updated.  Review of Systems  Neurological:  Positive for headaches.   Per HPI unless specifically indicated above     Objective:    BP 125/78    Pulse 91    Temp 97.9 F (36.6 C) (Oral)    Ht 5' 8.9" (1.75 m)    Wt 175 lb 3.2 oz (79.5 kg)    SpO2 97%    BMI 25.95 kg/m   Wt Readings from Last 3 Encounters:  07/29/21 175 lb 3.2 oz (79.5 kg)  07/26/21 175 lb 9.6 oz (79.7 kg)  06/10/21 174 lb 12.8 oz (79.3 kg)    Physical Exam Vitals and nursing note reviewed.  Constitutional:      General: He is not in acute distress.    Appearance: Normal appearance. He is not ill-appearing or diaphoretic.  HENT:     Head: Atraumatic.     Right Ear: Tympanic membrane and external ear normal. There is no impacted cerumen.     Left Ear: External ear normal.     Nose: No congestion or rhinorrhea.     Mouth/Throat:     Pharynx: No oropharyngeal exudate or posterior oropharyngeal erythema.  Eyes:     Conjunctiva/sclera:  Conjunctivae normal.  Cardiovascular:     Rate and Rhythm: Normal rate and regular rhythm.     Heart sounds: No murmur heard.   No friction rub. No gallop.  Pulmonary:     Effort: No respiratory distress.     Breath sounds: No stridor. No wheezing or rhonchi.  Chest:     Chest wall: No tenderness.  Abdominal:     General: Abdomen is flat.  Musculoskeletal:     Cervical back: No tenderness.     Left lower leg: No edema.  Neurological:     Mental Status: He is alert.    Results for orders placed or performed in visit on 07/26/21  CBC with Differential/Platelet  Result Value Ref Range   WBC 7.1 3.4 - 10.8 x10E3/uL   RBC 5.20 4.14 - 5.80 x10E6/uL   Hemoglobin 14.8 13.0 - 17.7 g/dL   Hematocrit 44.8 37.5 - 51.0 %   MCV 86 79 - 97 fL   MCH 28.5 26.6 - 33.0 pg   MCHC 33.0  31.5 - 35.7 g/dL   RDW 13.4 11.6 - 15.4 %   Platelets 269 150 - 450 x10E3/uL   Neutrophils 55 Not Estab. %   Lymphs 29 Not Estab. %   Monocytes 10 Not Estab. %   Eos 5 Not Estab. %   Basos 1 Not Estab. %   Neutrophils Absolute 3.9 1.4 - 7.0 x10E3/uL   Lymphocytes Absolute 2.1 0.7 - 3.1 x10E3/uL   Monocytes Absolute 0.7 0.1 - 0.9 x10E3/uL   EOS (ABSOLUTE) 0.3 0.0 - 0.4 x10E3/uL   Basophils Absolute 0.1 0.0 - 0.2 x10E3/uL   Immature Granulocytes 0 Not Estab. %   Immature Grans (Abs) 0.0 0.0 - 0.1 x10E3/uL  Bayer DCA Hb A1c Waived  Result Value Ref Range   HB A1C (BAYER DCA - WAIVED) 7.1 (H) 4.8 - 5.6 %  Comprehensive metabolic panel  Result Value Ref Range   Glucose 151 (H) 70 - 99 mg/dL   BUN 21 8 - 27 mg/dL   Creatinine, Ser 1.15 0.76 - 1.27 mg/dL   eGFR 65 >59 mL/min/1.73   BUN/Creatinine Ratio 18 10 - 24   Sodium 137 134 - 144 mmol/L   Potassium 3.8 3.5 - 5.2 mmol/L   Chloride 97 96 - 106 mmol/L   CO2 23 20 - 29 mmol/L   Calcium 9.3 8.6 - 10.2 mg/dL   Total Protein 7.1 6.0 - 8.5 g/dL   Albumin 4.4 3.7 - 4.7 g/dL   Globulin, Total 2.7 1.5 - 4.5 g/dL   Albumin/Globulin Ratio 1.6 1.2 - 2.2    Bilirubin Total 0.8 0.0 - 1.2 mg/dL   Alkaline Phosphatase 57 44 - 121 IU/L   AST 24 0 - 40 IU/L   ALT 19 0 - 44 IU/L  Lipid Panel w/o Chol/HDL Ratio  Result Value Ref Range   Cholesterol, Total 179 100 - 199 mg/dL   Triglycerides 142 0 - 149 mg/dL   HDL 45 >39 mg/dL   VLDL Cholesterol Cal 25 5 - 40 mg/dL   LDL Chol Calc (NIH) 109 (H) 0 - 99 mg/dL  TSH  Result Value Ref Range   TSH 4.020 0.450 - 4.500 uIU/mL        Current Outpatient Medications:    BD PEN NEEDLE NANO U/F 32G X 4 MM MISC, USE 1 TIME DAILY AS DIRECTED., Disp: 100 each, Rfl: 12   Blood Glucose Monitoring Suppl (ONE TOUCH ULTRA SYSTEM KIT) w/Device KIT, 1 kit by Does not apply route once., Disp: 1 each, Rfl: 0   carbidopa-levodopa (SINEMET IR) 25-100 MG tablet, TAKE ONE TABLET BY MOUTH THREE TIMES A DAY, Disp: 270 tablet, Rfl: 0   carvedilol (COREG) 6.25 MG tablet, Take 1 tablet (6.25 mg total) by mouth 2 (two) times daily with a meal., Disp: 180 tablet, Rfl: 1   diclofenac sodium (VOLTAREN) 1 % GEL, Apply 2 g topically 4 (four) times daily. (Patient taking differently: Apply 2 g topically 4 (four) times daily as needed (lower back pain.).), Disp: 100 g, Rfl: 0   enalapril (VASOTEC) 10 MG tablet, Take 1 tablet (10 mg total) by mouth daily., Disp: 90 tablet, Rfl: 1   fluticasone (FLONASE) 50 MCG/ACT nasal spray, PLACE 2 SPRAYS INTO BOTH NOSTRILS 2 TIMES DAILY, Disp: 16 g, Rfl: 6   gabapentin (NEURONTIN) 300 MG capsule, Take 2 capsules (600 mg total) by mouth 2 (two) times daily., Disp: 360 capsule, Rfl: 1   Insulin Glargine (BASAGLAR KWIKPEN) 100 UNIT/ML, Inject 10 Units into the skin daily.,  Disp: 9 mL, Rfl: 0   Insulin Pen Needle (ULTICARE PEN NEEDLES) 29G X 12MM MISC, USE AS DIRECTED, Disp: 100 each, Rfl: 12   Lancets (ONETOUCH ULTRASOFT) lancets, TEST BLOOD SUGAR THREE TIMES DAILY, Disp: 100 each, Rfl: 12   lidocaine (LIDODERM) 5 %, Place 1 patch onto the skin every 12 (twelve) hours. Remove & Discard patch within 12  hours or as directed by MD, Disp: 30 patch, Rfl: 0   lovastatin (MEVACOR) 40 MG tablet, Take 1 tablet (40 mg total) by mouth daily., Disp: 90 tablet, Rfl: 1   Menthol, Topical Analgesic, (BENGAY EX), Apply 1 application topically 3 (three) times daily as needed (lower back pain.)., Disp: , Rfl:    metFORMIN (GLUCOPHAGE-XR) 500 MG 24 hr tablet, TAKE 2 TABLETS BY MOUTH IN THE MORNING AND AT BEDTIME, Disp: 360 tablet, Rfl: 1   ONETOUCH ULTRA test strip, TEST BLOOD SUGAR THREE TIMES DAILY., Disp: 100 each, Rfl: 12   traZODone (DESYREL) 100 MG tablet, Take 1 tablet (100 mg total) by mouth at bedtime., Disp: 90 tablet, Rfl: 1   valACYclovir (VALTREX) 1000 MG tablet, Take 1 tablet (1,000 mg total) by mouth daily as needed., Disp: 90 tablet, Rfl: 0    Assessment & Plan:  DM ;  Is on metformin 2000 mg total. Is also on basaglar 10 wants to increase to 15 untis.  check HbA1c,  urine  microalbumin  diabetic diet plan given to pt  adviced regarding hypoglycemia and instructions given to pt today on how to prevent and treat the same if it were to occur. pt acknowledges the plan and voices understanding of the same   2. Htn would like to have hcbtz on hand for prn usage says he checked his BP at least 3 times a day pt advised to keep Bp logs. Pt verbalised understanding of the same. Pt to have a low salt diet . Exercise to reach a goal of at least 150 mins a week.  lifestyle modifications explained and pt understands importance of the above.   Problem List Items Addressed This Visit       Cardiovascular and Mediastinum   Essential hypertension     No orders of the defined types were placed in this encounter.    No orders of the defined types were placed in this encounter.    Follow up plan: No follow-ups on file.  Will need to follow-up with primary care physician

## 2021-07-29 NOTE — Telephone Encounter (Signed)
-----   Message from [place] sent at 07/28/2021  3:55 PM EST ----- Regarding: Patient assistance Hello, Just wanted to update you, my assistant called [facility] and was advised that the forms were not received, this was documented in his chart on 12/13. She is talking with the patient to assist him on the next steps, and will coordinate the forms.

## 2021-07-29 NOTE — Patient Instructions (Signed)
Lab Results  Component Value Date   HGBA1C 7.1 (H) 07/26/2021

## 2021-07-29 NOTE — Telephone Encounter (Signed)
Just an fyi attachment to previous encounters.

## 2021-07-30 NOTE — Telephone Encounter (Signed)
Requested Prescriptions  Pending Prescriptions Disp Refills   VOLTAREN 1 % GEL [Pharmacy Med Name: VOLTAREN GEL 1% TOPICAL GEL GM] 480 g     Sig: APPLY FOUR GRAMS TO AFFECTED AREA FOUR TIMES A DAY (PAIN AREA)     Analgesics:  Topicals Passed - 07/29/2021 11:37 AM      Passed - Valid encounter within last 12 months    Recent Outpatient Visits          Yesterday Diabetes mellitus without complication ([place])   [place], Avanti, MD   4 days ago Essential hypertension   [place], Megan P, DO   1 month ago [place], Lauren A, NP   2 months ago Recurrent UTI   [place], NP   4 months ago Recurrent UTI   [facility] [place], [place], DO      Future Appointments            In 2 months Wynetta Emery, Barb Merino, DO [employer], [facility]

## 2021-08-01 ENCOUNTER — Other Ambulatory Visit: Payer: Self-pay | Admitting: Internal Medicine

## 2021-08-02 NOTE — Telephone Encounter (Signed)
Requested medication (s) are due for refill today: yes  Requested medication (s) are on the active medication list: yes  Last refill:  08/20/17  Future visit scheduled: yes  Notes to clinic:  pt was prescribed med by Merrie Roof initially the sig are different appy 2 grams versus 4 grams.   Requested Prescriptions  Pending Prescriptions Disp Refills   VOLTAREN 1 % GEL [Pharmacy Med Name: VOLTAREN GEL 1% TOPICAL GEL GM] 480 g     Sig: APPLY FOUR GRAMS TO AFFECTED AREA FOUR TIMES A DAY (PAIN AREA)     Analgesics:  Topicals Passed - 08/01/2021 10:48 AM      Passed - Valid encounter within last 12 months    Recent Outpatient Visits           4 days ago Diabetes mellitus without complication ([place])   [facility] Vigg, Avanti, MD   1 week ago Essential hypertension   [place], Millard, DO   1 month ago [place], Lauren A, NP   3 months ago Recurrent UTI   [facility] [name], NP   4 months ago Recurrent UTI   [facility] Cope, [place], DO       Future Appointments             In 2 months Wynetta Emery, Barb Merino, DO [employer]

## 2021-08-04 NOTE — Telephone Encounter (Signed)
Pt would like Rick [name] to call him and set an appt to help him fill out his [name] application?

## 2021-08-17 ENCOUNTER — Telehealth: Payer: Self-pay

## 2021-08-17 NOTE — Telephone Encounter (Signed)
Medication has been sent in 

## 2021-09-01 ENCOUNTER — Telehealth: Payer: Self-pay

## 2021-09-13 NOTE — Progress Notes (Signed)
I have attempted to reach out to the patient to schedule an appointment with the clinical pharmacist, unsuccessful attempts.  Rick [name], [place]

## 2021-09-19 ENCOUNTER — Telehealth: Payer: Self-pay | Admitting: Family Medicine

## 2021-09-19 NOTE — Telephone Encounter (Signed)
Copied from [place] [id number]. Topic: General - Other >> Sep 19, 2021  1:45 PM Tessa Lerner A wrote: Reason for CRM: The patient has called for an update on their previously submitted application to the [employer] can be reached at [phone]  Please contact further when available

## 2021-09-23 NOTE — Progress Notes (Signed)
I have contacted [name] [facility] about an update on patient assistance application. The representative stated they have not received any application since 32/4401. I have attempted to reach out to the patient to see if he has brought the application to the office but he did not answer and I had left him a vm.   Corrie Mckusick, [place]

## 2021-10-24 ENCOUNTER — Ambulatory Visit: Payer: Medicare Other | Admitting: Family Medicine

## 2021-10-25 ENCOUNTER — Telehealth: Payer: Self-pay | Admitting: Family Medicine

## 2021-10-25 NOTE — Telephone Encounter (Signed)
Copied from [place] [phone]. Topic: General - Other ?>> Oct 25, 2021 12:39 PM Rick [name] A wrote: ?Reason for CRM: The patient has called for an update on their application for assistance with their insulin  ? ?The patient shares that staff helped them fill out an application with the [facility] and they have not heard anything further  ? ?Please contact when possible ?

## 2021-10-26 ENCOUNTER — Ambulatory Visit (INDEPENDENT_AMBULATORY_CARE_PROVIDER_SITE_OTHER): Payer: Medicare Other | Admitting: Internal Medicine

## 2021-10-26 ENCOUNTER — Encounter: Payer: Self-pay | Admitting: Internal Medicine

## 2021-10-26 ENCOUNTER — Telehealth: Payer: Self-pay | Admitting: Family Medicine

## 2021-10-26 ENCOUNTER — Other Ambulatory Visit: Payer: Self-pay

## 2021-10-26 VITALS — BP 124/76 | HR 83 | Temp 97.9°F | Ht 68.9 in | Wt 176.0 lb

## 2021-10-26 DIAGNOSIS — E119 Type 2 diabetes mellitus without complications: Secondary | ICD-10-CM

## 2021-10-26 DIAGNOSIS — H6091 Unspecified otitis externa, right ear: Secondary | ICD-10-CM

## 2021-10-26 MED ORDER — AMOXICILLIN-POT CLAVULANATE 500-125 MG PO TABS: 1.0000 | ORAL_TABLET | Freq: Two times a day (BID) | ORAL | 0 refills | Status: AC

## 2021-10-26 NOTE — Telephone Encounter (Signed)
Pt called in to [facility] and made a new patient appointment, pt stated he was unhappy with Dr Wynetta Emery and stated he was not happy about being taken off his insulin FYI ?

## 2021-10-26 NOTE — Progress Notes (Signed)
? ?BP 124/76   Pulse 83   Temp 97.9 ?F (36.6 ?C) (Oral)   Ht 5' 8.9" (1.75 m)   Wt 176 lb (79.8 kg)   SpO2 100%   BMI 26.07 kg/m?   ? ?Subjective:  ? ? Patient ID: Rick [name], male    DOB: 1940-08-19, 81 y.o.   MRN: [id number] ? ?Chief Complaint  ?Patient presents with  ? ear itching  ?  Right ear  ? A1c  ?  Wants his A1c done  ? ? ?HPI: ?Rick [name] is a 81 y.o. male ? ?Ear Fullness  ?There is pain in the right ear. This is a new problem. The current episode started in the past 7 days.  ? ?Chief Complaint  ?Patient presents with  ? ear itching  ?  Right ear  ? A1c  ?  Wants his A1c done  ? ? ?Relevant past medical, surgical, family and social history reviewed and updated as indicated. Interim medical history since our last visit reviewed. ?Allergies and medications reviewed and updated. ? ?Review of Systems ? ?Per HPI unless specifically indicated above ? ?   ?Objective:  ?  ?BP 124/76   Pulse 83   Temp 97.9 ?F (36.6 ?C) (Oral)   Ht 5' 8.9" (1.75 m)   Wt 176 lb (79.8 kg)   SpO2 100%   BMI 26.07 kg/m?   ?Wt Readings from Last 3 Encounters:  ?10/26/21 176 lb (79.8 kg)  ?07/29/21 175 lb 3.2 oz (79.5 kg)  ?07/26/21 175 lb 9.6 oz (79.7 kg)  ?  ?Physical Exam ? ?Results for orders placed or performed in visit on 07/26/21  ?CBC with Differential/Platelet  ?Result Value Ref Range  ? WBC 7.1 3.4 - 10.8 x10E3/uL  ? RBC 5.20 4.14 - 5.80 x10E6/uL  ? Hemoglobin 14.8 13.0 - 17.7 g/dL  ? Hematocrit 44.8 37.5 - 51.0 %  ? MCV 86 79 - 97 fL  ? MCH 28.5 26.6 - 33.0 pg  ? MCHC 33.0 31.5 - 35.7 g/dL  ? RDW 13.4 11.6 - 15.4 %  ? Platelets 269 150 - 450 x10E3/uL  ? Neutrophils 55 Not Estab. %  ? Lymphs 29 Not Estab. %  ? Monocytes 10 Not Estab. %  ? Eos 5 Not Estab. %  ? Basos 1 Not Estab. %  ? Neutrophils Absolute 3.9 1.4 - 7.0 x10E3/uL  ? Lymphocytes Absolute 2.1 0.7 - 3.1 x10E3/uL  ? Monocytes Absolute 0.7 0.1 - 0.9 x10E3/uL  ? EOS (ABSOLUTE) 0.3 0.0 - 0.4 x10E3/uL  ? Basophils Absolute 0.1 0.0 - 0.2 x10E3/uL  ?  Immature Granulocytes 0 Not Estab. %  ? Immature Grans (Abs) 0.0 0.0 - 0.1 x10E3/uL  ?Bayer DCA Hb A1c Waived  ?Result Value Ref Range  ? HB A1C (BAYER DCA - WAIVED) 7.1 (H) 4.8 - 5.6 %  ?Comprehensive metabolic panel  ?Result Value Ref Range  ? Glucose 151 (H) 70 - 99 mg/dL  ? BUN 21 8 - 27 mg/dL  ? Creatinine, Ser 1.15 0.76 - 1.27 mg/dL  ? eGFR 65 >59 mL/min/1.73  ? BUN/Creatinine Ratio 18 10 - 24  ? Sodium 137 134 - 144 mmol/L  ? Potassium 3.8 3.5 - 5.2 mmol/L  ? Chloride 97 96 - 106 mmol/L  ? CO2 23 20 - 29 mmol/L  ? Calcium 9.3 8.6 - 10.2 mg/dL  ? Total Protein 7.1 6.0 - 8.5 g/dL  ? Albumin 4.4 3.7 - 4.7 g/dL  ? Globulin, Total 2.7  1.5 - 4.5 g/dL  ? Albumin/Globulin Ratio 1.6 1.2 - 2.2  ? Bilirubin Total 0.8 0.0 - 1.2 mg/dL  ? Alkaline Phosphatase 57 44 - 121 IU/L  ? AST 24 0 - 40 IU/L  ? ALT 19 0 - 44 IU/L  ?Lipid Panel w/o Chol/HDL Ratio  ?Result Value Ref Range  ? Cholesterol, Total 179 100 - 199 mg/dL  ? Triglycerides 142 0 - 149 mg/dL  ? HDL 45 >39 mg/dL  ? VLDL Cholesterol Cal 25 5 - 40 mg/dL  ? LDL Chol Calc (NIH) 109 (H) 0 - 99 mg/dL  ?TSH  ?Result Value Ref Range  ? TSH 4.020 0.450 - 4.500 uIU/mL  ? ?   ? ? ?Current Outpatient Medications:  ?  BD PEN NEEDLE NANO U/F 32G X 4 MM MISC, USE 1 TIME DAILY AS DIRECTED., Disp: 100 each, Rfl: 12 ?  Blood Glucose Monitoring Suppl (ONE TOUCH ULTRA SYSTEM KIT) w/Device KIT, 1 kit by Does not apply route once., Disp: 1 each, Rfl: 0 ?  carbidopa-levodopa (SINEMET IR) 25-100 MG tablet, TAKE ONE TABLET BY MOUTH THREE TIMES A DAY, Disp: 270 tablet, Rfl: 0 ?  carvedilol (COREG) 6.25 MG tablet, Take 1 tablet (6.25 mg total) by mouth 2 (two) times daily with a meal., Disp: 180 tablet, Rfl: 1 ?  diclofenac sodium (VOLTAREN) 1 % GEL, Apply 2 g topically 4 (four) times daily. (Patient taking differently: Apply 2 g topically 4 (four) times daily as needed (lower back pain.).), Disp: 100 g, Rfl: 0 ?  enalapril (VASOTEC) 10 MG tablet, Take 1 tablet (10 mg total) by mouth  daily., Disp: 90 tablet, Rfl: 1 ?  fluticasone (FLONASE) 50 MCG/ACT nasal spray, PLACE 2 SPRAYS INTO BOTH NOSTRILS 2 TIMES DAILY, Disp: 16 g, Rfl: 6 ?  gabapentin (NEURONTIN) 300 MG capsule, Take 2 capsules (600 mg total) by mouth 2 (two) times daily., Disp: 360 capsule, Rfl: 1 ?  hydrochlorothiazide (HYDRODIURIL) 12.5 MG tablet, Take 1 tablet (12.5 mg total) by mouth daily. Take as needed, Disp: 30 tablet, Rfl: 0 ?  Insulin Glargine (BASAGLAR KWIKPEN) 100 UNIT/ML, Inject 15 Units into the skin daily., Disp: 15 mL, Rfl: 0 ?  Insulin Pen Needle (ULTICARE PEN NEEDLES) 29G X 12MM MISC, USE AS DIRECTED, Disp: 100 each, Rfl: 12 ?  Lancets (ONETOUCH ULTRASOFT) lancets, TEST BLOOD SUGAR THREE TIMES DAILY, Disp: 100 each, Rfl: 12 ?  lidocaine (LIDODERM) 5 %, Place 1 patch onto the skin every 12 (twelve) hours. Remove & Discard patch within 12 hours or as directed by MD, Disp: 30 patch, Rfl: 0 ?  lovastatin (MEVACOR) 40 MG tablet, Take 1 tablet (40 mg total) by mouth daily., Disp: 90 tablet, Rfl: 1 ?  Menthol, Topical Analgesic, (BENGAY EX), Apply 1 application topically 3 (three) times daily as needed (lower back pain.)., Disp: , Rfl:  ?  metFORMIN (GLUCOPHAGE-XR) 500 MG 24 hr tablet, TAKE 2 TABLETS BY MOUTH IN THE MORNING AND AT BEDTIME, Disp: 360 tablet, Rfl: 1 ?  ONETOUCH ULTRA test strip, TEST BLOOD SUGAR THREE TIMES DAILY., Disp: 100 each, Rfl: 12 ?  traZODone (DESYREL) 100 MG tablet, Take 1 tablet (100 mg total) by mouth at bedtime., Disp: 90 tablet, Rfl: 1 ?  valACYclovir (VALTREX) 1000 MG tablet, Take 1 tablet (1,000 mg total) by mouth daily as needed., Disp: 90 tablet, Rfl: 0 ?  VOLTAREN 1 % GEL, APPLY FOUR GRAMS TO AFFECTED AREA FOUR TIMES A DAY (PAIN AREA), Disp: 480 g, Rfl: 1  ? ? ?  Assessment & Plan:  ?Ear pain  will start pt on Augmentin for acute otitis ?Pt wants an a1c  ? ?Problem List Items Addressed This Visit   ? ?  ? Endocrine  ? Diabetes mellitus without complication ([place]) - Primary  ? Relevant Orders  ?  Bayer DCA Hb A1c Waived (STAT)  ?  ? ?Orders Placed This Encounter  ?Procedures  ? Bayer DCA Hb A1c Waived (STAT)  ?  ? ?No orders of the defined types were placed in this encounter. ?  ? ?Follow up plan: ?No follow-ups on file. ? ? ?

## 2021-10-26 NOTE — Telephone Encounter (Signed)
Copied from [place] [phone]. Topic: General - Other ?>> Oct 26, 2021  3:45 PM Leward Quan A wrote: ?Reason for CRM: Patient called in to inquire of Dr Neomia Dear if he will need some drops to go in his ear along with the oral medication that he will be taking. Asking for a call back with an answer please at Ph# [phone] ?

## 2021-10-26 NOTE — Telephone Encounter (Signed)
Spoke with patient while in office today and explained that his [facility] documents were not faxed because Dr. Wynetta Emery is no longer prescribing insulin. Patient stated that he does not agree with the provider's decision and that he will be getting a second opinion. Patient states that he will be a "miserable old man" if he is not able to  have his insulin so he can eat corn bread and other small amounts of carbs. Patient also stated that he will contact [facility] for new provider recommendations because he has been taking insulin for 15 years and his last A1c being 7.2 is unsatisfactory to him.   ?

## 2021-10-27 ENCOUNTER — Ambulatory Visit: Payer: Self-pay | Admitting: *Deleted

## 2021-10-27 ENCOUNTER — Telehealth: Payer: Self-pay | Admitting: Family Medicine

## 2021-10-27 ENCOUNTER — Other Ambulatory Visit: Payer: Medicare Other

## 2021-10-27 LAB — BAYER DCA HB A1C WAIVED: HB A1C (BAYER DCA - WAIVED): 6.2 % — ABNORMAL HIGH (ref 4.8–5.6)

## 2021-10-27 NOTE — Telephone Encounter (Signed)
Drew at [employer] called to request a one time approval for needles. He says the patient is very loud and hard to deal with. Please call back and advise If this is not possible ? ? ?Best contact: [phone] (ask to speak to [facility])  ?

## 2021-10-27 NOTE — Progress Notes (Signed)
Pl let pt know this was normal and will need to fu with pcp as scheudled thnx.

## 2021-10-27 NOTE — Telephone Encounter (Signed)
Pt called in and requested to speak directly with Tammy, please advise.  ?

## 2021-10-27 NOTE — Telephone Encounter (Signed)
Summary: A1c advice  ? Pt is calling to find out what his a1c was 07/26/21?  ?  ? ?Called patient to review A1C. No answer, continual ringing. Unable to leave message.  ?

## 2021-10-27 NOTE — Telephone Encounter (Signed)
? ? ?  Chief Complaint: Pt. Concerned because "someone has taken me off my insulin. I won't be able my normal diet."  ?Symptoms: None ?Frequency: n/a ?Pertinent Negatives: Patient denies n/a ?Disposition: '[]'$ ED /'[]'$ Urgent Care (no appt availability in office) / '[]'$ Appointment(In office/virtual)/ '[]'$  [place]/ '[]'$ Home Care/ '[]'$ Refused Recommended Disposition /'[]'$ [place]/ '[]'$  Follow-up with PCP ?Additional Notes: Pt. States "I need to stay on my insulin." Please advise pt.States he wants "to change doctors." Please advise.  ?Answer Assessment - Initial Assessment Questions ?1. REASON FOR CALL or QUESTION: "What is your reason for calling today?" or "How can I best ?help you?" or "What question do you have that I can help answer?" ?    Pt. Concerned he has been "taken off my insulin." ?2. CALLER: Document the source of call. (e.g., laboratory, patient). ?    Patient ? ?Protocols used: PCP Call - No Triage-A-AH ? ?

## 2021-10-27 NOTE — Telephone Encounter (Signed)
Called and spoke with patient. Advised him of Dr. Levada Dy message, patient verbalized understanding.  ? ?Also advised patient of his A1C result per Dr. Neomia Dear.  ?

## 2021-10-27 NOTE — Telephone Encounter (Signed)
[facility] and informed Dian Situ that Mr. Sternberg is no longer on Insulin and would no long require a prescription for needles ?

## 2021-10-27 NOTE — Telephone Encounter (Signed)
Requested medication (s) are due for refill today - yes, expired Rx ? ?Requested medication (s) are on the active medication list -yes ? ?Future visit scheduled -no ? ?Last refill: 08/26/20 #100 12 RF ? ?Notes to clinic: Request RF: expired Rx ? ?Requested Prescriptions  ?Pending Prescriptions Disp Refills  ? BD PEN NEEDLE NANO U/F 32G X 4 MM MISC [Pharmacy Med Name: BD PEN NEEDLE NANO U/F 32G X 4 MM] 100 each 12  ?  Sig: USE 1 TIME DAILY AS DIRECTED.  ?  ? Endocrinology: Diabetes - Testing Supplies Passed - 10/27/2021  9:04 AM  ?  ?  Passed - Valid encounter within last 12 months  ?  Recent Outpatient Visits   ? ?      ? Yesterday Diabetes mellitus without complication ([place])  ? [facility] Vigg, Avanti, MD  ? 3 months ago Diabetes mellitus without complication ([place])  ? [facility] Vigg, Avanti, MD  ? 3 months ago Essential hypertension  ? [place], Megan P, DO  ? 4 months ago Dysuria  ? [facility], Lauren A, NP  ? 5 months ago Recurrent UTI  ? [facility] [name], NP  ? ?  ?  ? ?  ?  ?  ? ? ? ?Requested Prescriptions  ?Pending Prescriptions Disp Refills  ? BD PEN NEEDLE NANO U/F 32G X 4 MM MISC [Pharmacy Med Name: BD PEN NEEDLE NANO U/F 32G X 4 MM] 100 each 12  ?  Sig: USE 1 TIME DAILY AS DIRECTED.  ?  ? Endocrinology: Diabetes - Testing Supplies Passed - 10/27/2021  9:04 AM  ?  ?  Passed - Valid encounter within last 12 months  ?  Recent Outpatient Visits   ? ?      ? Yesterday Diabetes mellitus without complication ([place])  ? [facility] Vigg, Avanti, MD  ? 3 months ago Diabetes mellitus without complication ([place])  ? [facility] Vigg, Avanti, MD  ? 3 months ago Essential hypertension  ? [place], Megan P, DO  ? 4 months ago Dysuria  ? [facility], Lauren A, NP  ? 5 months ago Recurrent UTI  ? [facility] [name], NP  ? ?  ?  ? ?  ?   ?  ? ? ? ?

## 2021-10-31 ENCOUNTER — Ambulatory Visit: Payer: Self-pay

## 2021-10-31 ENCOUNTER — Ambulatory Visit: Payer: Medicare Other | Admitting: Nurse Practitioner

## 2021-10-31 NOTE — Telephone Encounter (Signed)
Returned patient's call as requested. Patients states he would like to know Dr. Durenda Age plan for his diabetes treatment since she does not want him taking inulin. Patient states he does not feel taking Metformin will be enough. Patient states it is evident that taking insulin makes his A1C lower and that he is not comfortable with it going up. Patient requests a call back to discuss.  ?

## 2021-10-31 NOTE — Telephone Encounter (Signed)
Copied from [employer] [id number]. Topic: Appointment Scheduling - Scheduling Inquiry for Clinic ?>> Oct 28, 2021  1:13 PM Greggory Keen D wrote: ?Reason for CRM: Pt called asking for Rick [name] the office manager to call him back.  CB#  [phone] ?>> Oct 28, 2021  5:26 PM Barth Kirks B wrote: ?Spoke with patient regarding his A1C and insulin concerns. Patient states he would like to sit down and speak with Dr. Wynetta Emery regarding her treatment plan. Patient states he reviewed his labs from 2015 and that his A1C was 6.5 back then. Patient states he has spoken with several doctor's about diabetes treatment and that he does not want to switch doctor's at this time. ?>> Oct 28, 2021  1:16 PM Street, [employer] L wrote: ?[place]'s clerk to [place]. ?

## 2021-10-31 NOTE — Telephone Encounter (Signed)
Pt called, he states that he is wanting to find out the medications he was taking when he seen Dr. Luan Pulling at [facility] back in 2017 and also that he wanted Dr. Neomia Dear as his PCP from now on. He reports that he has some issues going on and feels that would be best to switch PCP. I advised him that Dr. Neomia Dear is leaving in June and could schedule appts with her but Dr. Neomia Dear wouldn't accepting new patients anymore. He wanted me to let Alwyn Ren know this information. He was provided with other providers in the office and was going to call his insurance to make sure if he seen them that it would be covered. Pt will f/up with his decision.  ?

## 2021-10-31 NOTE — Telephone Encounter (Signed)
Pt called, unable to leave VM d/t line kept ringing and no answering machine available.  ? ?Summary: Medication Advice  ? Pt is calling to ask what medications he was taking when he was a patient Dr. Larene Beach?   ?  ? ?

## 2021-11-01 ENCOUNTER — Other Ambulatory Visit: Payer: Self-pay | Admitting: Family Medicine

## 2021-11-01 NOTE — Telephone Encounter (Signed)
Pt was calling to follow up on this Rx refill request, please advise.  ?

## 2021-11-02 NOTE — Telephone Encounter (Signed)
Refilled by provider 11/02/21. ?

## 2021-11-09 ENCOUNTER — Other Ambulatory Visit: Payer: Self-pay | Admitting: Family Medicine

## 2021-11-09 NOTE — Telephone Encounter (Signed)
Requested medication (s) are due for refill today: yes ? ?Requested medication (s) are on the active medication list: yes ? ?Last refill:  07/24/20 #16g/6 ? ?Future visit scheduled: no ? ?Notes to clinic:  Unable to refill per protocol, cannot delegate. ? ? ? ?  ?Requested Prescriptions  ?Pending Prescriptions Disp Refills  ? fluticasone (FLONASE) 50 MCG/ACT nasal spray [Pharmacy Med Name: FLUTICASONE PROPIONATE 50 MCG/ACT N] 16 g 6  ?  Sig: PLACE 2 SPRAYS INTO BOTH NOSTRILS 2 TIMES DAILY  ?  ? Not Delegated - Ear, Nose, and Throat: Nasal Preparations - Corticosteroids Failed - 11/09/2021  9:40 AM  ?  ?  Failed - This refill cannot be delegated  ?  ?  Passed - Valid encounter within last 12 months  ?  Recent Outpatient Visits   ? ?      ? 2 weeks ago Diabetes mellitus without complication ([place])  ? [facility] Vigg, Avanti, MD  ? 3 months ago Diabetes mellitus without complication ([place])  ? [facility] Vigg, Avanti, MD  ? 3 months ago Essential hypertension  ? [place], Megan P, DO  ? 5 months ago Dysuria  ? [facility], Lauren A, NP  ? 6 months ago Recurrent UTI  ? [facility] [name], NP  ? ?  ?  ? ?  ?  ?  ? ?

## 2021-11-11 ENCOUNTER — Other Ambulatory Visit: Payer: Self-pay | Admitting: Family Medicine

## 2021-11-11 MED ORDER — BASAGLAR KWIKPEN 100 UNIT/ML ~~LOC~~ SOPN: 15.0000 [IU] | PEN_INJECTOR | Freq: Every day | SUBCUTANEOUS | 0 refills | Status: DC

## 2021-11-11 NOTE — Telephone Encounter (Signed)
We will keep him on insulin as long as his A1c remains over 6.0. If he drops lower than that we will need to adjust other medicines, be it insulin or other medicine. He will need to come in every 3 months on the insulin. He had his visit 3/15- so I will follow up with him in June. Please get him scheduled for an appointment. Refill sent to his pharmacy. ?

## 2021-11-15 ENCOUNTER — Telehealth (INDEPENDENT_AMBULATORY_CARE_PROVIDER_SITE_OTHER): Payer: Medicare Other | Admitting: Internal Medicine

## 2021-11-15 ENCOUNTER — Encounter: Payer: Self-pay | Admitting: Internal Medicine

## 2021-11-15 DIAGNOSIS — J019 Acute sinusitis, unspecified: Secondary | ICD-10-CM | POA: Insufficient documentation

## 2021-11-15 DIAGNOSIS — J069 Acute upper respiratory infection, unspecified: Secondary | ICD-10-CM | POA: Insufficient documentation

## 2021-11-15 MED ORDER — AMOXICILLIN 500 MG PO CAPS: 500.0000 mg | ORAL_CAPSULE | Freq: Two times a day (BID) | ORAL | 0 refills | Status: AC

## 2021-11-15 MED ORDER — BENZONATATE 100 MG PO CAPS: 100.0000 mg | ORAL_CAPSULE | Freq: Two times a day (BID) | ORAL | 0 refills | Status: DC | PRN

## 2021-11-15 MED ORDER — FEXOFENADINE HCL 180 MG PO TABS: 180.0000 mg | ORAL_TABLET | Freq: Every day | ORAL | 1 refills | Status: DC

## 2021-11-15 MED ORDER — ALBUTEROL SULFATE HFA 108 (90 BASE) MCG/ACT IN AERS: 2.0000 | INHALATION_SPRAY | Freq: Four times a day (QID) | RESPIRATORY_TRACT | 0 refills | Status: DC | PRN

## 2021-11-15 NOTE — Progress Notes (Signed)
? ?There were no vitals taken for this visit.  ? ?Subjective:  ? ? Patient ID: Rick [name], male    DOB: 1941-07-12, 81 y.o.   MRN: [id number] ? ?Chief Complaint  ?Patient presents with  ? Headache  ?  Sneezing, SOB, and, congestion, for past 10 days.   ? ? ?HPI: ?Rick [name] is a 81 y.o. male ? ?8- 10 days now has been sneezing per pt, started having phlegm and mucus says he cannot breathe well. Some wheezing.coughing up mucus ? ? ?Headache  ?This is a new problem. Associated symptoms include coughing. Pertinent negatives include no abdominal pain, ear pain, fever, neck pain, rhinorrhea, sore throat, swollen glands or vomiting.  ?Wheezing  ?This is a new problem. The current episode started yesterday. Associated symptoms include coughing, headaches and shortness of breath. Pertinent negatives include no abdominal pain, chest pain, chills, coryza, diarrhea, ear pain, fever, hemoptysis, neck pain, rash, rhinorrhea, sore throat, sputum production, swollen glands or vomiting.  ? ?Chief Complaint  ?Patient presents with  ? Headache  ?  Sneezing, SOB, and, congestion, for past 10 days.   ? ? ?Relevant past medical, surgical, family and social history reviewed and updated as indicated. Interim medical history since our last visit reviewed. ?Allergies and medications reviewed and updated. ? ?Review of Systems  ?Constitutional:  Negative for chills and fever.  ?HENT:  Negative for ear pain, rhinorrhea and sore throat.   ?Respiratory:  Positive for cough, shortness of breath and wheezing. Negative for hemoptysis and sputum production.   ?Cardiovascular:  Negative for chest pain.  ?Gastrointestinal:  Negative for abdominal pain, diarrhea and vomiting.  ?Musculoskeletal:  Negative for neck pain.  ?Skin:  Negative for rash.  ?Neurological:  Positive for headaches.  ? ?Per HPI unless specifically indicated above ? ?   ?Objective:  ?  ?There were no vitals taken for this visit.  ?Wt Readings from Last 3 Encounters:   ?10/26/21 176 lb (79.8 kg)  ?07/29/21 175 lb 3.2 oz (79.5 kg)  ?07/26/21 175 lb 9.6 oz (79.7 kg)  ?  ?Physical Exam ? ?Results for orders placed or performed in visit on 10/26/21  ?Bayer DCA Hb A1c Waived (STAT)  ?Result Value Ref Range  ? HB A1C (BAYER DCA - WAIVED) 6.2 (H) 4.8 - 5.6 %  ? ?   ? ? ?Current Outpatient Medications:  ?  albuterol (VENTOLIN HFA) 108 (90 Base) MCG/ACT inhaler, Inhale 2 puffs into the lungs every 6 (six) hours as needed for wheezing or shortness of breath., Disp: 8 g, Rfl: 0 ?  amoxicillin (AMOXIL) 500 MG capsule, Take 1 capsule (500 mg total) by mouth 2 (two) times daily for 10 days., Disp: 20 capsule, Rfl: 0 ?  BD PEN NEEDLE NANO U/F 32G X 4 MM MISC, USE 1 TIME DAILY AS DIRECTED., Disp: 100 each, Rfl: 12 ?  benzonatate (TESSALON) 100 MG capsule, Take 1 capsule (100 mg total) by mouth 2 (two) times daily as needed for cough., Disp: 20 capsule, Rfl: 0 ?  Blood Glucose Monitoring Suppl (ONE TOUCH ULTRA SYSTEM KIT) w/Device KIT, 1 kit by Does not apply route once., Disp: 1 each, Rfl: 0 ?  carbidopa-levodopa (SINEMET IR) 25-100 MG tablet, TAKE ONE TABLET BY MOUTH THREE TIMES A DAY, Disp: 270 tablet, Rfl: 0 ?  carvedilol (COREG) 6.25 MG tablet, Take 1 tablet (6.25 mg total) by mouth 2 (two) times daily with a meal., Disp: 180 tablet, Rfl: 1 ?  diclofenac sodium (VOLTAREN) 1 %  GEL, Apply 2 g topically 4 (four) times daily. (Patient taking differently: Apply 2 g topically 4 (four) times daily as needed (lower back pain.).), Disp: 100 g, Rfl: 0 ?  enalapril (VASOTEC) 10 MG tablet, Take 1 tablet (10 mg total) by mouth daily., Disp: 90 tablet, Rfl: 1 ?  fexofenadine (ALLEGRA ALLERGY) 180 MG tablet, Take 1 tablet (180 mg total) by mouth daily., Disp: 10 tablet, Rfl: 1 ?  fluticasone (FLONASE) 50 MCG/ACT nasal spray, PLACE 2 SPRAYS INTO BOTH NOSTRILS 2 TIMES DAILY, Disp: 16 g, Rfl: 6 ?  gabapentin (NEURONTIN) 300 MG capsule, Take 2 capsules (600 mg total) by mouth 2 (two) times daily., Disp: 360  capsule, Rfl: 1 ?  hydrochlorothiazide (HYDRODIURIL) 12.5 MG tablet, Take 1 tablet (12.5 mg total) by mouth daily. Take as needed, Disp: 30 tablet, Rfl: 0 ?  Insulin Glargine (BASAGLAR KWIKPEN) 100 UNIT/ML, Inject 15 Units into the skin daily., Disp: 15 mL, Rfl: 0 ?  Insulin Pen Needle (ULTICARE PEN NEEDLES) 29G X 12MM MISC, USE AS DIRECTED, Disp: 100 each, Rfl: 12 ?  Lancets (ONETOUCH ULTRASOFT) lancets, TEST BLOOD SUGAR THREE TIMES DAILY, Disp: 100 each, Rfl: 12 ?  lovastatin (MEVACOR) 40 MG tablet, Take 1 tablet (40 mg total) by mouth daily., Disp: 90 tablet, Rfl: 1 ?  Menthol, Topical Analgesic, (BENGAY EX), Apply 1 application topically 3 (three) times daily as needed (lower back pain.)., Disp: , Rfl:  ?  metFORMIN (GLUCOPHAGE-XR) 500 MG 24 hr tablet, TAKE 2 TABLETS BY MOUTH IN THE MORNING AND AT BEDTIME, Disp: 360 tablet, Rfl: 1 ?  ONETOUCH ULTRA test strip, TEST BLOOD SUGAR THREE TIMES DAILY., Disp: 100 each, Rfl: 12 ?  traZODone (DESYREL) 100 MG tablet, Take 1 tablet (100 mg total) by mouth at bedtime., Disp: 90 tablet, Rfl: 1 ?  valACYclovir (VALTREX) 1000 MG tablet, Take 1 tablet (1,000 mg total) by mouth daily as needed., Disp: 90 tablet, Rfl: 0 ?  VOLTAREN 1 % GEL, APPLY FOUR GRAMS TO AFFECTED AREA FOUR TIMES A DAY (PAIN AREA), Disp: 480 g, Rfl: 1  ? ? ?Assessment & Plan:  ? ?URI: COVID, Flu and strep ordered at this visit.pt to come in and get swabbed curb sde.  ? pt advised to take Tylenol q 4- 6 hourly as needed. pt to take allegra q pm as needed and to call office if symptoms worsened pt verbalised understanding of such. ? ? ? ?Problem List Items Addressed This Visit   ? ?  ? Respiratory  ? Viral upper respiratory tract infection - Primary  ?  ? ?No orders of the defined types were placed in this encounter. ?  ? ?Meds ordered this encounter  ?Medications  ? albuterol (VENTOLIN HFA) 108 (90 Base) MCG/ACT inhaler  ?  Sig: Inhale 2 puffs into the lungs every 6 (six) hours as needed for wheezing or  shortness of breath.  ?  Dispense:  8 g  ?  Refill:  0  ? benzonatate (TESSALON) 100 MG capsule  ?  Sig: Take 1 capsule (100 mg total) by mouth 2 (two) times daily as needed for cough.  ?  Dispense:  20 capsule  ?  Refill:  0  ? fexofenadine (ALLEGRA ALLERGY) 180 MG tablet  ?  Sig: Take 1 tablet (180 mg total) by mouth daily.  ?  Dispense:  10 tablet  ?  Refill:  1  ? amoxicillin (AMOXIL) 500 MG capsule  ?  Sig: Take 1 capsule (500 mg total)  by mouth 2 (two) times daily for 10 days.  ?  Dispense:  20 capsule  ?  Refill:  0  ?  ? ?Follow up plan: ?No follow-ups on file. ? ? ?This visit was completed via telephone due to the restrictions of the COVID-19 pandemic. All issues as above were discussed and addressed but no physical exam was performed. If it was felt that the patient should be evaluated in the office, they were directed there. The patient verbally consented to this visit. Patient was unable to complete an audio/visual visit due to Technical difficulties?, ?Lack of internet. Due to the catastrophic nature of the COVID-19 pandemic, this visit was done through audio contact only. ?Location of the patient: home ?Location of the provider: work ?Those involved with this call:  ?Provider: Charlynne Cousins, MD ?CMA: Frazier Butt, CMA ?Front Desk/Registration: [employer]  ?Time spent on call:  10 minutes on the phone discussing health concerns. 10 minutes total spent in review of patient's record and preparation of their chart. ? ?

## 2021-11-15 NOTE — Progress Notes (Signed)
Please let pt know this was normal. Have sent him abx for a possible sinus infection pl let him know to pick them up ?Thank you!

## 2021-11-15 NOTE — Telephone Encounter (Signed)
Patient came in office for swabs from today's visit. Advised patient of Dr. Durenda Age message. Patient states that he gets this through patient assistance with [employer]. Patient asked if we could send in the paperwork so he could get his insulin.  ? ?Paperwork filled out and placed in Dr. Durenda Age folder for signature. Will fax once completed.  ?

## 2021-11-16 LAB — NOVEL CORONAVIRUS, NAA: SARS-CoV-2, NAA: NOT DETECTED

## 2021-11-17 ENCOUNTER — Other Ambulatory Visit: Payer: Self-pay

## 2021-11-17 ENCOUNTER — Other Ambulatory Visit: Payer: Self-pay | Admitting: Family Medicine

## 2021-11-17 MED ORDER — VALACYCLOVIR HCL 1 G PO TABS: 1000.0000 mg | ORAL_TABLET | Freq: Every day | ORAL | 0 refills | Status: DC | PRN

## 2021-11-17 NOTE — Addendum Note (Signed)
Addended by: [name] on: 11/17/2021 03:03 PM ? ? Modules accepted: Orders ? ?

## 2021-11-17 NOTE — Telephone Encounter (Signed)
Medication Refill - Medication: valACYclovir (VALTREX) 1000 MG tablet ? ?Pt requested extra refills.  ? ?Has the patient contacted their pharmacy? No. No more refills.  ?(Agent: If no, request that the patient contact the pharmacy for the refill. If patient does not wish to contact the pharmacy document the reason why and proceed with request.) ? ? ?Preferred Pharmacy (with phone number or street name):  ?[facility] [place] [place] [address]  ?Phone: [phone] Fax: [phone]  ?Hours: Not open 24 hours  ? ?Has the patient been seen for an appointment in the last year OR does the patient have an upcoming appointment? Yes.   ? ?Agent: Please be advised that RX refills may take up to 3 business days. We ask that you follow-up with your pharmacy.  ?

## 2021-11-18 ENCOUNTER — Telehealth: Payer: Self-pay | Admitting: Family Medicine

## 2021-11-18 LAB — VERITOR FLU A/B WAIVED
Influenza A: NEGATIVE
Influenza B: NEGATIVE

## 2021-11-18 LAB — CULTURE, GROUP A STREP: Strep A Culture: NEGATIVE

## 2021-11-18 LAB — RAPID STREP SCREEN (MED CTR MEBANE ONLY): Strep Gp A Ag, IA W/Reflex: NEGATIVE

## 2021-11-18 NOTE — Telephone Encounter (Signed)
error 

## 2021-11-18 NOTE — Telephone Encounter (Signed)
Requested medication (s) are due for refill today -no ? ?Requested medication (s) are on the active medication list -yes ? ?Future visit scheduled -no ? ?Last refill: 11/17/21 #90 ? ?Notes to clinic: Patient requesting additional RF on Rx sent yesterday- sent for review  ? ?Requested Prescriptions  ?Pending Prescriptions Disp Refills  ? valACYclovir (VALTREX) 1000 MG tablet 90 tablet 0  ?  Sig: Take 1 tablet (1,000 mg total) by mouth daily as needed.  ?  ? Antimicrobials:  Antiviral Agents - Anti-Herpetic Passed - 11/17/2021  5:24 PM  ?  ?  Passed - Valid encounter within last 12 months  ?  Recent Outpatient Visits   ? ?      ? 3 days ago Viral upper respiratory tract infection  ? [facility] Vigg, Avanti, MD  ? 3 weeks ago Diabetes mellitus without complication ([place])  ? [facility] Vigg, Avanti, MD  ? 3 months ago Diabetes mellitus without complication ([place])  ? [facility] Vigg, Avanti, MD  ? 3 months ago Essential hypertension  ? [place], Megan P, DO  ? 5 months ago Dysuria  ? [place] McElwee, Lauren A, NP  ? ?  ?  ? ?  ?  ?  ? ? ? ?Requested Prescriptions  ?Pending Prescriptions Disp Refills  ? valACYclovir (VALTREX) 1000 MG tablet 90 tablet 0  ?  Sig: Take 1 tablet (1,000 mg total) by mouth daily as needed.  ?  ? Antimicrobials:  Antiviral Agents - Anti-Herpetic Passed - 11/17/2021  5:24 PM  ?  ?  Passed - Valid encounter within last 12 months  ?  Recent Outpatient Visits   ? ?      ? 3 days ago Viral upper respiratory tract infection  ? [facility] Vigg, Avanti, MD  ? 3 weeks ago Diabetes mellitus without complication ([place])  ? [facility] Vigg, Avanti, MD  ? 3 months ago Diabetes mellitus without complication ([place])  ? [facility] Vigg, Avanti, MD  ? 3 months ago Essential hypertension  ? [place], Megan P, DO  ? 5 months ago Dysuria  ? [place] McElwee, Lauren  A, NP  ? ?  ?  ? ?  ?  ?  ? ? ? ?

## 2021-11-18 NOTE — Telephone Encounter (Addendum)
Patient wants to know why PCP is not going to update application. Patient states if PCP does not fill out form he will not be able to get his insulin. Patient would like application fax to [facility] fax # [phone]. Patient states application is already filled out and all the nurse needs to do is fax it, patient not clear of the delay and states time sensitive.   ?

## 2021-11-18 NOTE — Telephone Encounter (Signed)
[employer] application has been in Dr. Durenda Age folder for [employer]. Pulled it out for her to sign and faxed in to [facility].  ? ?Called patient and explained that his application was just waiting on Dr. Durenda Age signature. Let him know that it has been signed and faxed in to [facility] this morning. Application pleased in scan bin to be scanned into chart.  ?

## 2021-11-18 NOTE — Telephone Encounter (Signed)
Copied from [place] [phone]. Topic: General - Other ?>> Nov 18, 2021  9:43 AM Tessa Lerner A wrote: ?Reason for CRM: The patient would like an update on the status of their Insulin Glargine (BASAGLAR KWIKPEN) 100 UNIT/ML [[id number]] medication coverage with [name]  ? ?[employer] has told the patient that no new application for coverage has been submitted by the practice  ? ?Please contact further when possible ?

## 2021-11-22 ENCOUNTER — Telehealth: Payer: Self-pay

## 2021-11-22 NOTE — Progress Notes (Signed)
I have updated panel and attempted to reach out to the patient to make him aware of the application being approved. Sent me to voicemail but was unable to leave a message.  ? ?Corrie Mckusick, RMA ?Health Concierge ? ?

## 2021-11-22 NOTE — Telephone Encounter (Signed)
Pt made a follow up call to ensure a fax was sent to [place].  ?

## 2021-11-23 ENCOUNTER — Telehealth: Payer: Self-pay

## 2021-11-23 NOTE — Chronic Care Management (AMB) (Signed)
? ? ?Chronic Care Management ?Pharmacy Assistant  ? ?Name: Rick [name]  MRN: [id number] DOB: 1941-05-28 ? ?Reason for Encounter: Disease State Hypertension ? ?Recent office visits:  ?11/15/21-Avanti Vigg, MD (Video visit) Seen for a headache. ?10/26/21-Avanti Vigg, MD Seen for ear itching. Labs ordered. Start on AUGMENTIN 500-125 MG tablet. ?07/29/21-Avanti Vigg, MD Seen for a medication problem.  ?07/26/21-Megan Annia Friendly, DO (PCP) General follow up visit. Labs ordered. Follow up in 3 months. ?06/10/21-Lauren Avelina Laine, NP. Seen for cloudy urine. Hold off on HCTZ. Start on AUGMENTIN 875-125 MG tablet. Follow up in 4 weeks.  ? ?Recent consult visits:  ?None noted ? ?Hospital visits:  ?None in previous 6 months ? ?Medications: ?Outpatient Encounter Medications as of 11/23/2021  ?Medication Sig  ? albuterol (VENTOLIN HFA) 108 (90 Base) MCG/ACT inhaler Inhale 2 puffs into the lungs every 6 (six) hours as needed for wheezing or shortness of breath.  ? amoxicillin (AMOXIL) 500 MG capsule Take 1 capsule (500 mg total) by mouth 2 (two) times daily for 10 days.  ? BD PEN NEEDLE NANO U/F 32G X 4 MM MISC USE 1 TIME DAILY AS DIRECTED.  ? benzonatate (TESSALON) 100 MG capsule Take 1 capsule (100 mg total) by mouth 2 (two) times daily as needed for cough.  ? Blood Glucose Monitoring Suppl (ONE TOUCH ULTRA SYSTEM KIT) w/Device KIT 1 kit by Does not apply route once.  ? carbidopa-levodopa (SINEMET IR) 25-100 MG tablet TAKE ONE TABLET BY MOUTH THREE TIMES A DAY  ? carvedilol (COREG) 6.25 MG tablet Take 1 tablet (6.25 mg total) by mouth 2 (two) times daily with a meal.  ? diclofenac sodium (VOLTAREN) 1 % GEL Apply 2 g topically 4 (four) times daily. (Patient taking differently: Apply 2 g topically 4 (four) times daily as needed (lower back pain.).)  ? enalapril (VASOTEC) 10 MG tablet Take 1 tablet (10 mg total) by mouth daily.  ? fexofenadine (ALLEGRA ALLERGY) 180 MG tablet Take 1 tablet (180 mg total) by mouth daily.  ?  fluticasone (FLONASE) 50 MCG/ACT nasal spray PLACE 2 SPRAYS INTO BOTH NOSTRILS 2 TIMES DAILY  ? gabapentin (NEURONTIN) 300 MG capsule Take 2 capsules (600 mg total) by mouth 2 (two) times daily.  ? hydrochlorothiazide (HYDRODIURIL) 12.5 MG tablet Take 1 tablet (12.5 mg total) by mouth daily. Take as needed  ? Insulin Glargine (BASAGLAR KWIKPEN) 100 UNIT/ML Inject 15 Units into the skin daily.  ? Insulin Pen Needle (ULTICARE PEN NEEDLES) 29G X 12MM MISC USE AS DIRECTED  ? Lancets (ONETOUCH ULTRASOFT) lancets TEST BLOOD SUGAR THREE TIMES DAILY  ? lovastatin (MEVACOR) 40 MG tablet Take 1 tablet (40 mg total) by mouth daily.  ? Menthol, Topical Analgesic, (BENGAY EX) Apply 1 application topically 3 (three) times daily as needed (lower back pain.).  ? metFORMIN (GLUCOPHAGE-XR) 500 MG 24 hr tablet TAKE 2 TABLETS BY MOUTH IN THE MORNING AND AT BEDTIME  ? ONETOUCH ULTRA test strip TEST BLOOD SUGAR THREE TIMES DAILY.  ? traZODone (DESYREL) 100 MG tablet Take 1 tablet (100 mg total) by mouth at bedtime.  ? valACYclovir (VALTREX) 1000 MG tablet Take 1 tablet (1,000 mg total) by mouth daily as needed.  ? VOLTAREN 1 % GEL APPLY FOUR GRAMS TO AFFECTED AREA FOUR TIMES A DAY (PAIN AREA)  ? ?No facility-administered encounter medications on file as of 11/23/2021.  ? ?Current antihypertensive regimen:  ?Carvedilol 6.25 mg take 1 tab twice daily ?enalapril (VASOTEC) 10 MG tablet Take 1 tablet (10 mg  total) by mouth daily ?Hydrochlorothiazide 12.5 mg taak 1 tab daily  ? ?Unsuccessful attempts to complete assessment call. I have called patient 3x and left 3 voicemail's for the patient to return my call when available. ? ? ?Adherence Review: ?Is the patient currently on ACE/ARB medication? Yes ?Does the patient have >5 day gap between last estimated fill dates? Yes ? ? ?Care Gaps: ?Zoster Vaccines:Never done ? ?Star Rating Drugs: ?Enalapril 10 mg Last filled:07/15/21 90 DS ?Lovastatin 40 mg Last filled:10/07/21 90 DS ?Metformin 500 mg  Last filled:10/13/21 90 DS ? ?Corrie Mckusick, RMA ?Health Concierge ? ?

## 2021-12-16 LAB — HM DIABETES EYE EXAM

## 2022-01-10 ENCOUNTER — Other Ambulatory Visit: Payer: Self-pay | Admitting: Family Medicine

## 2022-01-11 NOTE — Telephone Encounter (Signed)
Requested Prescriptions  Pending Prescriptions Disp Refills  . BD PEN NEEDLE NANO U/F 32G X 4 MM MISC [Pharmacy Med Name: BD PEN NEEDLE NANO U/F 32G X 4 MM] 100 each 12    Sig: USE 1 TIME DAILY AS DIRECTED.     Endocrinology: Diabetes - Testing Supplies Passed - 01/10/2022  9:32 AM      Passed - Valid encounter within last 12 months    Recent Outpatient Visits          1 month ago Viral upper respiratory tract infection   [facility] Vigg, Avanti, MD   2 months ago Diabetes mellitus without complication ([place])   [facility] Vigg, Avanti, MD   5 months ago Diabetes mellitus without complication ([place])   [facility] Vigg, Avanti, MD   5 months ago Essential hypertension   [place], [place], DO   7 months ago [place] McElwee, Scheryl Darter, NP

## 2022-02-06 ENCOUNTER — Other Ambulatory Visit: Payer: Self-pay | Admitting: Internal Medicine

## 2022-02-21 ENCOUNTER — Telehealth: Payer: Self-pay

## 2022-02-21 NOTE — Chronic Care Management (AMB) (Signed)
Chronic Care Management Pharmacy Assistant   Name: Rick [name]  MRN: [id number] DOB: [birth year]   Reason for Encounter: Disease State-General    Recent office visits:  None since the last coordination call 11/23/21  Recent consult visits:  None since the last coordination call 11/23/21  Hospital visits:  None in previous 6 months  Medications: Outpatient Encounter Medications as of 02/21/2022  Medication Sig   albuterol (VENTOLIN HFA) 108 (90 Base) MCG/ACT inhaler Inhale 2 puffs into the lungs every 6 (six) hours as needed for wheezing or shortness of breath.   BD PEN NEEDLE NANO U/F 32G X 4 MM MISC USE 1 TIME DAILY AS DIRECTED.   benzonatate (TESSALON) 100 MG capsule Take 1 capsule (100 mg total) by mouth 2 (two) times daily as needed for cough.   Blood Glucose Monitoring Suppl (ONE TOUCH ULTRA SYSTEM KIT) w/Device KIT 1 kit by Does not apply route once.   carbidopa-levodopa (SINEMET IR) 25-100 MG tablet TAKE ONE TABLET BY MOUTH THREE TIMES A DAY   carvedilol (COREG) 6.25 MG tablet Take 1 tablet (6.25 mg total) by mouth 2 (two) times daily with a meal.   diclofenac sodium (VOLTAREN) 1 % GEL Apply 2 g topically 4 (four) times daily. (Patient taking differently: Apply 2 g topically 4 (four) times daily as needed (lower back pain.).)   enalapril (VASOTEC) 10 MG tablet Take 1 tablet (10 mg total) by mouth daily.   fexofenadine (ALLEGRA ALLERGY) 180 MG tablet Take 1 tablet (180 mg total) by mouth daily.   fluticasone (FLONASE) 50 MCG/ACT nasal spray PLACE 2 SPRAYS INTO BOTH NOSTRILS 2 TIMES DAILY   gabapentin (NEURONTIN) 300 MG capsule Take 2 capsules (600 mg total) by mouth 2 (two) times daily.   hydrochlorothiazide (HYDRODIURIL) 12.5 MG tablet TAKE 1 TABLET BY MOUTH DAILY. TAKE AS NEEDED.   Insulin Glargine (BASAGLAR KWIKPEN) 100 UNIT/ML Inject 15 Units into the skin daily.   Insulin Pen Needle (ULTICARE PEN NEEDLES) 29G X 12MM MISC USE AS DIRECTED   Lancets (ONETOUCH  ULTRASOFT) lancets TEST BLOOD SUGAR THREE TIMES DAILY   lovastatin (MEVACOR) 40 MG tablet Take 1 tablet (40 mg total) by mouth daily.   Menthol, Topical Analgesic, (BENGAY EX) Apply 1 application topically 3 (three) times daily as needed (lower back pain.).   metFORMIN (GLUCOPHAGE-XR) 500 MG 24 hr tablet TAKE 2 TABLETS BY MOUTH IN THE MORNING AND AT BEDTIME   ONETOUCH ULTRA test strip TEST BLOOD SUGAR THREE TIMES DAILY.   traZODone (DESYREL) 100 MG tablet Take 1 tablet (100 mg total) by mouth at bedtime.   valACYclovir (VALTREX) 1000 MG tablet Take 1 tablet (1,000 mg total) by mouth daily as needed.   VOLTAREN 1 % GEL APPLY FOUR GRAMS TO AFFECTED AREA FOUR TIMES A DAY (PAIN AREA)   No facility-administered encounter medications on file as of 02/21/2022.   Contacted [name] for General Review Call   Chart Review:  Have there been any documented new, changed, or discontinued medications since last visit? No (If yes, include name, dose, frequency, date) Has there been any documented recent hospitalizations or ED visits since last visit with Clinical Pharmacist? No Brief Summary (including medication and/or Diagnosis changes):   Adherence Review:  Does the Clinical Pharmacist Assistant have access to adherence rates? No Adherence rates for STAR metric medications (List medication(s)/day supply/ last 2 fill dates). Adherence rates for medications indicated for disease state being reviewed (List medication(s)/day supply/ last 2 fill dates). Does the  patient have >5 day gap between last estimated fill dates for any of the above medications or other medication gaps? No Reason for medication gaps.   Disease State Questions:  Able to connect with Patient? Yes  Did patient have any problems with their health recently? No Note problems and Concerns:  Have you had any admissions or emergency room visits or worsening of your condition(s) since last visit? No Details of ED visit,  hospital visit and/or worsening condition(s):  Have you had any visits with new specialists or providers since your last visit? No Explain:  Have you had any new health care problem(s) since your last visit? No New problem(s) reported:  Have you run out of any of your medications since you last spoke with clinical pharmacist? No What caused you to run out of your medications?  Are there any medications you are not taking as prescribed? No What kept you from taking your medications as prescribed?  Are you having any issues or side effects with your medications? No Note of issues or side effects:  Do you have any other health concerns or questions you want to discuss with your Clinical Pharmacist before your next visit? No Note additional concerns and questions from Patient.  Are there any health concerns that you feel we can do a better job addressing? No Note Patient's response.  Are you having any problems with any of the following since the last visit: (select all that apply)  None  Details:  12. Any falls since last visit? No  Details:  13. Any increased or uncontrolled pain since last visit? No  Details:  14. Next visit Type: office       Visit with:Clinical Pharmacist        Date:05/15/22        Time:1pm  15. Additional Details? No    Care Gaps: Colonoscopy-NA Diabetic Foot Exam-07/26/21 Ophthalmology-12/16/21 Dexa Scan - NA Annual Well Visit - NA Micro albumin-NA Hemoglobin A1c- 10/26/21  Star Rating Drugs: Enalapril 10 mg-last fill Lovastatin 40 mg-last fill 10/07/21 90 ds Metformin 500 mg-last fill 10/13/21 90 ds  Ethelene Hal Clinical Pharmacist Assistant [phone]

## 2022-03-14 ENCOUNTER — Other Ambulatory Visit: Payer: Self-pay | Admitting: Family Medicine

## 2022-03-15 NOTE — Telephone Encounter (Signed)
Requested Prescriptions  Pending Prescriptions Disp Refills  . traZODone (DESYREL) 100 MG tablet [Pharmacy Med Name: TRAZODONE HCL 100 MG TAB] 90 tablet 1    Sig: TAKE 1 TABLET BY MOUTH AT BEDTIME     Psychiatry: Antidepressants - Serotonin Modulator Passed - 03/14/2022  1:45 PM      Passed - Completed PHQ-2 or PHQ-9 in the last 360 days      Passed - Valid encounter within last 6 months    Recent Outpatient Visits          4 months ago Viral upper respiratory tract infection   [facility] Vigg, Avanti, MD   4 months ago Diabetes mellitus without complication ([place])   [place] Vigg, Avanti, MD   7 months ago Diabetes mellitus without complication ([place])   [place], Avanti, MD   7 months ago Essential hypertension   [place], [place], DO   9 months ago [place], Lauren A, NP      Future Appointments            In 5 months Ralene Bathe, MD [place]

## 2022-03-16 ENCOUNTER — Other Ambulatory Visit: Payer: Self-pay | Admitting: Family Medicine

## 2022-03-17 NOTE — Telephone Encounter (Signed)
Requested Prescriptions  Pending Prescriptions Disp Refills  . carbidopa-levodopa (SINEMET IR) 25-100 MG tablet [Pharmacy Med Name: CARBIDOPA-LEVODOPA 25-100 MG TAB] 270 tablet 1    Sig: TAKE ONE TABLET BY MOUTH THREE TIMES A DAY     Neurology:  Parkinsonian Agents 2 Passed - 03/16/2022  1:18 PM      Passed - WBC in normal range and within 360 days    WBC  Date Value Ref Range Status  07/26/2021 7.1 3.4 - 10.8 x10E3/uL Final  11/02/2020 9.2 4.0 - 10.5 K/uL Final         Passed - PLT in normal range and within 360 days    Platelets  Date Value Ref Range Status  07/26/2021 269 150 - 450 x10E3/uL Final         Passed - HGB in normal range and within 360 days    Hemoglobin  Date Value Ref Range Status  07/26/2021 14.8 13.0 - 17.7 g/dL Final         Passed - HCT in normal range and within 360 days    Hematocrit  Date Value Ref Range Status  07/26/2021 44.8 37.5 - 51.0 % Final         Passed - Cr in normal range and within 360 days    Creatinine  Date Value Ref Range Status  11/28/2012 0.94 0.60 - 1.30 mg/dL Final   Creatinine, Ser  Date Value Ref Range Status  07/26/2021 1.15 0.76 - 1.27 mg/dL Final         Passed - AST in normal range and within 360 days    AST  Date Value Ref Range Status  07/26/2021 24 0 - 40 IU/L Final         Passed - ALT in normal range and within 360 days    ALT  Date Value Ref Range Status  07/26/2021 19 0 - 44 IU/L Final         Passed - Last BP in normal range    BP Readings from Last 1 Encounters:  10/26/21 124/76         Passed - Valid encounter within last 12 months    Recent Outpatient Visits          4 months ago Viral upper respiratory tract infection   [facility] Vigg, Avanti, MD   4 months ago Diabetes mellitus without complication ([place])   [facility] Vigg, Avanti, MD   7 months ago Diabetes mellitus without complication ([place])   [place] Vigg, Avanti, MD   7 months ago Essential  hypertension   [place], [place], DO   9 months ago [place], Lauren A, NP      Future Appointments            In 5 months Ralene Bathe, MD [place]

## 2022-03-27 ENCOUNTER — Other Ambulatory Visit: Payer: Self-pay | Admitting: Family Medicine

## 2022-03-27 MED ORDER — VALACYCLOVIR HCL 1 G PO TABS: 1000.0000 mg | ORAL_TABLET | Freq: Every day | ORAL | 0 refills | Status: AC | PRN

## 2022-03-27 NOTE — Telephone Encounter (Signed)
Requested Prescriptions  Pending Prescriptions Disp Refills  . valACYclovir (VALTREX) 1000 MG tablet 90 tablet 0    Sig: Take 1 tablet (1,000 mg total) by mouth daily as needed.     Antimicrobials:  Antiviral Agents - Anti-Herpetic Passed - 03/27/2022 11:30 AM      Passed - Valid encounter within last 12 months    Recent Outpatient Visits          4 months ago Viral upper respiratory tract infection   [facility] Vigg, Avanti, MD   5 months ago Diabetes mellitus without complication ([place])   [place] Vigg, Avanti, MD   8 months ago Diabetes mellitus without complication ([place])   [place], Avanti, MD   8 months ago Essential hypertension   [place], [place], DO   9 months ago [place], Lauren A, NP      Future Appointments            In 4 months Ralene Bathe, MD [place]

## 2022-03-27 NOTE — Telephone Encounter (Signed)
Medication Refill - Medication: valACYclovir (VALTREX) 1000 MG tablet  Has the patient contacted their pharmacy? No.   Preferred Pharmacy (with phone number or street name):  [facility] [address] Phone:  [phone]  Fax:  [phone]     Has the patient been seen for an appointment in the last year OR does the patient have an upcoming appointment? Yes.

## 2022-03-27 NOTE — Telephone Encounter (Signed)
Duplicate request. Requested Prescriptions  Pending Prescriptions Disp Refills  . valACYclovir (VALTREX) 1000 MG tablet [Pharmacy Med Name: VALACYCLOVIR HCL 1 GM TAB] 90 tablet 0    Sig: TAKE 1 TABLET BY MOUTH DAILY AS NEEDED     Antimicrobials:  Antiviral Agents - Anti-Herpetic Passed - 03/27/2022 12:50 PM      Passed - Valid encounter within last 12 months    Recent Outpatient Visits          4 months ago Viral upper respiratory tract infection   [facility] Vigg, Avanti, MD   5 months ago Diabetes mellitus without complication ([place])   [place], MD   8 months ago Diabetes mellitus without complication ([place])   [place], Avanti, MD   8 months ago Essential hypertension   [place], [place], DO   9 months ago [place], Lauren A, NP      Future Appointments            In 4 months Ralene Bathe, MD [place]

## 2022-03-29 ENCOUNTER — Ambulatory Visit: Payer: Medicare Other | Admitting: Family Medicine

## 2022-04-21 LAB — HM DIABETES EYE EXAM

## 2022-04-27 ENCOUNTER — Encounter: Payer: Self-pay | Admitting: Family Medicine

## 2022-05-15 ENCOUNTER — Telehealth: Payer: Medicare Other

## 2022-05-15 ENCOUNTER — Telehealth: Payer: Self-pay

## 2022-05-15 NOTE — Telephone Encounter (Signed)
  Care Management   Follow Up Note   05/15/2022 Name: Rick [name] MRN: [id number] DOB: 06/20/1941   Referred by: [name], DO Reason for referral : Chronic Care Management   An unsuccessful telephone outreach was attempted today. The patient was referred to the case management team for assistance with care management and care coordination.   Follow Up Plan: We have been unable to make contact with the patient for follow up. The care management team is available to follow up with the patient after provider conversation with the patient regarding recommendation for care management engagement and subsequent re-referral to the care management team.   [place] Constable, Pharm.D. - [phone]

## 2022-06-28 ENCOUNTER — Other Ambulatory Visit: Payer: Self-pay | Admitting: Family Medicine

## 2022-06-28 NOTE — Telephone Encounter (Signed)
Requested medication (s) are due for refill today: yes  Requested medication (s) are on the active medication list: yes    Last refill: 03/15/22  #90  0 refills  Future visit scheduled With pharmacy 07/17/22  Notes to clinic:Pt needs appt, attempted to reach  "Number not in service at this time"  Please review. Thank you.  Requested Prescriptions  Pending Prescriptions Disp Refills   traZODone (DESYREL) 100 MG tablet [Pharmacy Med Name: TRAZODONE HCL 100 MG TAB] 90 tablet 0    Sig: TAKE 1 TABLET BY MOUTH AT BEDTIME     Psychiatry: Antidepressants - Serotonin Modulator Failed - 06/28/2022 11:58 AM      Failed - Valid encounter within last 6 months    Recent Outpatient Visits           7 months ago Viral upper respiratory tract infection   [place] Vigg, Avanti, MD   8 months ago Diabetes mellitus without complication ([place])   [facility] Vigg, Avanti, MD   11 months ago Diabetes mellitus without complication ([place])   [place], Avanti, MD   11 months ago Essential hypertension   [place], [place], DO   1 year ago [place], Lauren A, NP       Future Appointments             In 1 month Ralene Bathe, MD [place] - Completed PHQ-2 or PHQ-9 in the last 360 days

## 2022-07-17 ENCOUNTER — Telehealth: Payer: Self-pay | Admitting: Family Medicine

## 2022-07-17 ENCOUNTER — Ambulatory Visit: Payer: Medicare Other

## 2022-07-17 DIAGNOSIS — E782 Mixed hyperlipidemia: Secondary | ICD-10-CM

## 2022-07-17 DIAGNOSIS — I10 Essential (primary) hypertension: Secondary | ICD-10-CM

## 2022-07-17 DIAGNOSIS — E119 Type 2 diabetes mellitus without complications: Secondary | ICD-10-CM

## 2022-07-17 NOTE — Progress Notes (Signed)
Chronic Care Management Pharmacy Note  07/17/2022 Name:  Rick [name] MRN:  [id number] DOB:  1941-05-11  Summary: -Pleasant 81 year old male. Unable to talk today, was "On his way out" and forgot about appt  Recommendations/Changes made from today's visit: -Renew PAP for 2024 -Patient candidate for med/high intensity statin. His LDL is elevated and his STAR ratings call for mod/high. Will cosign PCP to let them know   Subjective: Rick [name] is an 81 y.o. year old male who is a primary patient of [name], DO.  The CCM team was consulted for assistance with disease management and care coordination needs.    Engaged with patient by telephone for follow up visit in response to provider referral for pharmacy case management and/or care coordination services.   Consent to Services:  The patient was given the following information about Chronic Care Management services today, agreed to services, and gave verbal consent: 1. CCM service includes personalized support from designated clinical staff supervised by the primary care provider, including individualized plan of care and coordination with other care providers 2. 24/7 contact phone numbers for assistance for urgent and routine care needs. 3. Service will only be billed when office clinical staff spend 20 minutes or more in a month to coordinate care. 4. Only one practitioner may furnish and bill the service in a calendar month. 5.The patient may stop CCM services at any time (effective at the end of the month) by phone call to the office staff. 6. The patient will be responsible for cost sharing (co-pay) of up to 20% of the service fee (after annual deductible is met). Patient agreed to services and consent obtained.  Patient Care Team: [name], DO as PCP - General (Family Medicine) Poggi, Marshall Cork, MD as Consulting Physician (Orthopedic Surgery) Lucas Mallow, MD as Consulting Physician (Urology) Lane Hacker, [facility] (Pharmacist)  Recent office visits:  None since the last coordination call 11/23/21   Recent consult visits:  None since the last coordination call 11/23/21   Hospital visits:  None in previous 6 months   Objective:  Lab Results  Component Value Date   CREATININE 1.15 07/26/2021   BUN 21 07/26/2021   EGFR 65 07/26/2021   GFRNONAA >60 11/02/2020   GFRAA 89 05/27/2020   NA 137 07/26/2021   K 3.8 07/26/2021   CALCIUM 9.3 07/26/2021   CO2 23 07/26/2021   GLUCOSE 151 (H) 07/26/2021    Lab Results  Component Value Date/Time   HGBA1C 6.2 (H) 10/26/2021 11:21 AM   HGBA1C 7.1 (H) 07/26/2021 08:42 AM   HGBA1C 6.3 01/05/2016 12:00 AM   MICROALBUR 80 (H) 02/01/2021 03:52 PM   MICROALBUR 10 12/23/2019 03:13 PM    Last diabetic Eye exam:  Lab Results  Component Value Date/Time   HMDIABEYEEXA No Retinopathy 04/21/2022 12:00 AM    Last diabetic Foot exam: No results found for: "HMDIABFOOTEX"   Lab Results  Component Value Date   CHOL 179 07/26/2021   HDL 45 07/26/2021   LDLCALC 109 (H) 07/26/2021   TRIG 142 07/26/2021   CHOLHDL 2.7 05/07/2018       Latest Ref Rng & Units 07/26/2021    8:47 AM 02/01/2021    3:57 PM 12/23/2019    4:10 PM  Hepatic Function  Total Protein 6.0 - 8.5 g/dL 7.1  7.3  6.8   Albumin 3.7 - 4.7 g/dL 4.4  4.5  4.7   AST 0 -  40 IU/L _0 ALT 0 - 44 IU/L _1 Alk Phosphatase 44 - 121 IU/L 57  57  62   Total Bilirubin 0.0 - 1.2 mg/dL 0.8  1.1  1.0     Lab Results  Component Value Date/Time   TSH 4.020 07/26/2021 08:47 AM   TSH 1.890 02/01/2021 03:57 PM       Latest Ref Rng & Units 07/26/2021    8:47 AM 02/01/2021    3:57 PM 11/02/2020   12:44 PM  CBC  WBC 3.4 - 10.8 x10E3/uL 7.1  7.9  9.2   Hemoglobin 13.0 - 17.7 g/dL 14.8  13.8  12.7   Hematocrit 37.5 - 51.0 % 44.8  40.1  39.4   Platelets 150 - 450 x10E3/uL 269  261  203     No results found for: "VD25OH", "VITAMINB12"  Clinical ASCVD: Yes  The ASCVD Risk score  (Arnett DK, et al., 2019) failed to calculate for the following reasons:   The 2019 ASCVD risk score is only valid for ages 19 to 24       10/26/2021   11:38 AM 12/26/2019    3:45 PM 06/04/2019   11:16 AM  Depression screen PHQ 2/9  Decreased Interest 0 0 0  Down, Depressed, Hopeless 0 1 1  PHQ - 2 Score 0 1 1  Altered sleeping 0 0   Tired, decreased energy 0 2   Change in appetite 0 0   Feeling bad or failure about yourself  0 1   Trouble concentrating 0 2   Moving slowly or fidgety/restless 0 0   Suicidal thoughts 0 0   PHQ-9 Score 0 6   Difficult doing work/chores Not difficult at all Not difficult at all      Other: (CHADS2VASc if Afib, MMRC or CAT for COPD, ACT, DEXA)  Social History   Tobacco Use  Smoking Status Former   Packs/day: 1.50   Years: 15.00   Total pack years: 22.50   Types: Cigarettes   Start date: 08/14/1994   Quit date: 03/23/2005   Years since quitting: 17.3  Smokeless Tobacco Former   Types: Snuff, Chew   Quit date: 03/23/2010   BP Readings from Last 3 Encounters:  10/26/21 124/76  07/29/21 125/78  07/26/21 126/71   Pulse Readings from Last 3 Encounters:  10/26/21 83  07/29/21 91  07/26/21 87   Wt Readings from Last 3 Encounters:  10/26/21 176 lb (79.8 kg)  07/29/21 175 lb 3.2 oz (79.5 kg)  07/26/21 175 lb 9.6 oz (79.7 kg)   BMI Readings from Last 3 Encounters:  10/26/21 26.07 kg/m  07/29/21 25.95 kg/m  07/26/21 26.01 kg/m    Assessment/Interventions: Review of patient past medical history, allergies, medications, health status, including review of consultants reports, laboratory and other test data, was performed as part of comprehensive evaluation and provision of chronic care management services.   SDOH:  (Social Determinants of Health) assessments and interventions performed: Yes SDOH Interventions    Flowsheet Row Chronic Care Management from 07/17/2022 in [place] Interventions   Transportation  Interventions Intervention Not Indicated  Financial Strain Interventions Other (Comment)  [PAP]      SDOH Screenings   Transportation Needs: No Transportation Needs (07/17/2022)  Depression (PHQ2-9): Low Risk  (10/26/2021)  Financial Resource Strain: High Risk (07/17/2022)  Tobacco Use: Medium Risk (11/15/2021)    CCM Care Plan  Allergies  Allergen  Reactions   Blood-Group Specific Substance    Haloperidol Other (See Comments)    Unknown   Sulfa Antibiotics Other (See Comments)    Unknown    Medications Reviewed Today     Reviewed by Lane Hacker, [facility] (Pharmacist) on 07/17/22 at 1459  Med List Status: <None>   Medication Order Taking? Sig Documenting Provider Last Dose Status Informant  albuterol (VENTOLIN HFA) 108 (90 Base) MCG/ACT inhaler [id number]  Inhale 2 puffs into the lungs every 6 (six) hours as needed for wheezing or shortness of breath. Charlynne Cousins, MD  Active   BD PEN NEEDLE NANO U/F 32G X 4 MM MISC [id number]  USE 1 TIME DAILY AS DIRECTED. Johnson, Megan P, DO  Active   benzonatate (TESSALON) 100 MG capsule [id number]  Take 1 capsule (100 mg total) by mouth 2 (two) times daily as needed for cough. Vigg, Avanti, MD  Active   Blood Glucose Monitoring Suppl (ONE TOUCH ULTRA SYSTEM KIT) w/Device KIT [id number] No 1 kit by Does not apply route once. Kathrine Haddock, NP Taking Active Self  carbidopa-levodopa (SINEMET IR) 25-100 MG tablet [id number]  TAKE ONE TABLET BY MOUTH THREE TIMES A DAY Johnson, Megan P, DO  Active   carvedilol (COREG) 6.25 MG tablet [id number] No Take 1 tablet (6.25 mg total) by mouth 2 (two) times daily with a meal. Wynetta Emery, Megan P, DO Taking Active   diclofenac sodium (VOLTAREN) 1 % GEL [id number] No Apply 2 g topically 4 (four) times daily.  Patient taking differently: Apply 2 g topically 4 (four) times daily as needed (lower back pain.).   Volney American, [place] Taking Active   enalapril (VASOTEC) 10 MG tablet [id number] No Take 1 tablet (10 mg  total) by mouth daily. Johnson, Megan P, DO Taking Active   fexofenadine (ALLEGRA ALLERGY) 180 MG tablet [id number]  Take 1 tablet (180 mg total) by mouth daily. Charlynne Cousins, MD  Active   fluticasone (FLONASE) 50 MCG/ACT nasal spray [id number] No PLACE 2 SPRAYS INTO BOTH NOSTRILS 2 TIMES DAILY Johnson, Megan P, DO Taking Active   gabapentin (NEURONTIN) 300 MG capsule [id number] No Take 2 capsules (600 mg total) by mouth 2 (two) times daily. Johnson, Megan P, DO Taking Active   hydrochlorothiazide (HYDRODIURIL) 12.5 MG tablet [id number]  TAKE 1 TABLET BY MOUTH DAILY. TAKE AS NEEDED. Johnson, Megan P, DO  Active   Insulin Glargine (BASAGLAR KWIKPEN) 100 UNIT/ML [id number] No Inject 15 Units into the skin daily. [place] Liter P, DO Taking Expired 02/09/22 2359   Insulin Pen Needle (ULTICARE PEN NEEDLES) 29G X 12MM MISC [id number] No USE AS DIRECTED [place] Liter P, DO Taking Active   Lancets [facility] ULTRASOFT) lancets [id number] No TEST BLOOD SUGAR THREE TIMES DAILY Wynetta Emery, Megan P, DO Taking Active   lovastatin (MEVACOR) 40 MG tablet [id number] No Take 1 tablet (40 mg total) by mouth daily. [place] Liter P, DO Taking Active   Menthol, Topical Analgesic, [facility] EX) [id number] No Apply 1 application topically 3 (three) times daily as needed (lower back pain.). [provider] Taking Active Self  metFORMIN (GLUCOPHAGE-XR) 500 MG 24 hr tablet [id number] No TAKE 2 TABLETS BY MOUTH IN THE MORNING AND AT BEDTIME [name], DO Taking Active   [facility] ULTRA test strip [id number] No TEST BLOOD SUGAR THREE TIMES DAILY. Park Liter P, DO Taking Active   traZODone (DESYREL) 100 MG tablet [id number]  TAKE 1 TABLET BY MOUTH AT BEDTIME Johnson, Megan P, DO  Active   valACYclovir (VALTREX)  1000 MG tablet [id number]  Take 1 tablet (1,000 mg total) by mouth daily as needed. Johnson, Megan P, DO  Active   VOLTAREN 1 % GEL [id number] No APPLY FOUR GRAMS TO AFFECTED AREA FOUR TIMES A DAY (PAIN AREA) [name], DO Taking Active             Patient Active Problem List   Diagnosis Date Noted   Viral upper respiratory tract infection 11/15/2021   Acute non-recurrent sinusitis 11/15/2021   Otitis externa of right ear 10/26/2021   Diabetes mellitus without complication ([place]) 15/72/6203   Tremor 05/28/2018   BPH (benign prostatic hyperplasia) 03/22/2018   Recurrent UTI 11/29/2017   Osteoarthritis of multiple joints 08/23/2017   PVD (peripheral vascular disease) ([place]) 05/14/2017   Hard of hearing 12/07/2016   History of tobacco abuse 12/07/2016   History of ulcerative colitis 12/07/2016   Ventral hernia 12/21/2015   History of colon cancer, no staging 12/21/2015   History of bladder cancer 08/05/2015   Benign fibroma of prostate 08/05/2015   History of alcoholism ([place]) 08/05/2015   History of migraine headaches 08/05/2015   Calculus of kidney 08/05/2015   Essential hypertension 05/11/2015   Hyperlipidemia 05/11/2015   Affective bipolar disorder ([place]) 03/24/2015   Diabetic peripheral neuropathy associated with type 2 diabetes mellitus ([place]) 03/24/2015   Insomnia 03/24/2015   History of abdominal hernia 02/10/2015   Anxiety and depression 02/10/2015   Generalized anxiety disorder 05/18/2014   Chronic prostatitis 06/12/2013   Herpesviral infection of penis 11/07/2012   Incomplete bladder emptying 11/07/2012   Incisional hernia 11/07/2012   Balanoposthitis 10/31/2012   Benign localized prostatic hyperplasia with lower urinary tract symptoms (LUTS) 10/31/2012   ED (erectile dysfunction) of organic origin 10/31/2012   History of cancer of ureter 10/31/2012    Immunization History  Administered Date(s) Administered   Influenza Split 05/18/2014   Influenza, High Dose Seasonal PF 05/11/2015   PFIZER(Purple Top)SARS-COV-2 Vaccination 10/12/2019, 11/03/2019   Td 09/30/2020    Conditions to be addressed/monitored:  Hypertension, Hyperlipidemia, and Diabetes  Care Plan : [place]  Updates made by Lane Hacker, [place] since 07/17/2022 12:00 AM     Problem: DM, HTN, HLD, BPH, tremor, osteoarthritis, depression, anxiety   Priority: High     Long-Range Goal: Disease Management   Start Date: 12/24/2020  Recent Progress: On track  Priority: High  Note:   Current Barriers:  Unable to independently afford treatment regimen Unable to independently monitor therapeutic efficacy Does not adhere to prescribed medication regimen Does not maintain contact with provider office  Pharmacist Clinical Goal(s):  Patient will verbalize ability to afford treatment regimen achieve adherence to monitoring guidelines and medication adherence to achieve therapeutic efficacy maintain control of diabetes as evidenced by lab values, SMBG readings  adhere to prescribed medication regimen as evidenced by fill dates through collaboration with PharmD and provider.   Interventions: 1:1 collaboration with [name], DO regarding development and update of comprehensive plan of care as evidenced by provider attestation and co-signature Inter-disciplinary care team collaboration (see longitudinal plan of care) Comprehensive medication review performed; medication list updated in electronic medical record  Hypertension (BP goal <130/80) BP Readings from Last 3 Encounters:  10/26/21 124/76  07/29/21 125/78  07/26/21 126/71  -Not ideally controlled -Current treatment: Carvedilol 6.25 mg bid Appropriate, Effective, Safe, Accessible Enalapril 10 mg qd Appropriate, Effective, Safe, Accessible HCTZ 12.35m Appropriate, Effective, Safe, Accessible -Medications previously tried: NA -Current home readings: 134/78, --  Denies hypotensive/hypertensive symptoms -Educated on BP goals and benefits of medications for prevention of heart attack, stroke and kidney damage; Daily salt intake goal < 2300 mg; Exercise goal of 150 minutes per week; Symptoms of hypotension and  importance of maintaining adequate hydration; -Counseled to monitor BP at home 3 times weekly, document, and provide log at future appointments December 2023: Non-compliant on meds. Unable to go over today because patient was, "On his way out the door."   Hyperlipidemia: (LDL goal < 70) The ASCVD Risk score (Arnett DK, et al., 2019) failed to calculate for the following reasons:   The 2019 ASCVD risk score is only valid for ages 51 to 34 Lab Results  Component Value Date   CHOL 179 07/26/2021   CHOL 149 02/01/2021   CHOL 138 05/27/2020   Lab Results  Component Value Date   HDL 45 07/26/2021   HDL 47 02/01/2021   HDL 50 05/27/2020   Lab Results  Component Value Date   LDLCALC 109 (H) 07/26/2021   LDLCALC 79 02/01/2021   LDLCALC 73 05/27/2020   Lab Results  Component Value Date   TRIG 142 07/26/2021   TRIG 127 02/01/2021   TRIG 77 05/27/2020   Lab Results  Component Value Date   CHOLHDL 2.7 05/07/2018   CHOLHDL 4.2 08/17/2015   CHOLHDL 3.1 05/11/2015  No results found for: "LDLDIRECT" Last vitamin D No results found for: "25OHVITD2", "25OHVITD3", "VD25OH" Lab Results  Component Value Date   TSH 4.020 07/26/2021  -Controlled -Current treatment: Lovastatin 40 mg qd Query Appropriate,  -Medications previously tried: NA  --Educated on Cholesterol goals;  Benefits of statin for ASCVD risk reduction; -Counseled on diet and exercise extensively December 2023: Recommend high intensity statin   Diabetes/ neuropathy (A1c goal <7%) Lab Results  Component Value Date   HGBA1C 6.2 (H) 10/26/2021   HGBA1C 7.1 (H) 07/26/2021   HGBA1C 6.2 02/01/2021   Lab Results  Component Value Date   MICROALBUR 80 (H) 02/01/2021   LDLCALC 109 (H) 07/26/2021   CREATININE 1.15 07/26/2021   Lab Results  Component Value Date   NA 137 07/26/2021   K 3.8 07/26/2021   CREATININE 1.15 07/26/2021   EGFR 65 07/26/2021   GFRNONAA >60 11/02/2020   GLUCOSE 151 (H) 07/26/2021   Lab  Results  Component Value Date   WBC 7.1 07/26/2021   HGB 14.8 07/26/2021   HCT 44.8 07/26/2021   MCV 86 07/26/2021   PLT 269 07/26/2021   Lab Results  Component Value Date   LABMICR See below: 06/10/2021   LABMICR See below: 05/02/2021   MICROALBUR 80 (H) 02/01/2021   MICROALBUR 10 12/23/2019  -Controlled -Current medications: Metformin xr 1000 mg bid Appropriate, Effective, Safe, Accessible Basaglar 15 u qd Appropriate, Effective, Safe, Accessible 2023: PAP Approved 2024: Working on PAP (December 2023) -Medications previously tried: NA -Current home glucose readings fasting glucose: 125-130, -Denies hypoglycemic/hyperglycemic symptoms  -Current exercise: very active, no structured exercise regimen -Educated on Complications of diabetes including kidney damage, retinal damage, and cardiovascular disease; Exercise goal of 150 minutes per week; Prevention and management of hypoglycemic episodes; Benefits of routine self-monitoring of blood sugar; -Counseled to check feet daily and get yearly eye exams completed eye exam 12/09/20 no retinopathy -Counseled on diet and exercise extensively December 2023: Will coordinate to make sure PAP renewed  Patient Goals/Self-Care Activities Patient will:  - take medications as prescribed focus on medication adherence by using pill box check glucose daily, document, and provide  at future appointments check blood pressure 3 times weekly, document, and provide at future appointments collaborate with provider on medication access solutions target a minimum of 150 minutes of moderate intensity exercise weekly  Follow Up Plan: Telephone follow up appointment with care management team member scheduled for: CPA 4-6 weeks PharmD 2 months.          Medication Assistance:   Insulin:  -PAP approved 2023 -PAP started December 2023 for 2024  Compliance/Adherence/Medication fill history: Care Gaps: Colonoscopy-NA Diabetic Foot  Exam-07/26/21 Ophthalmology-12/16/21 Dexa Scan - NA Annual Well Visit - NA Micro albumin-NA Hemoglobin A1c- 10/26/21   Star Rating Drugs: Enalapril 10 mg-last fill Lovastatin 40 mg-last fill 10/07/21 90 ds Metformin 500 mg-last fill 10/13/21 90 ds  Patient's preferred pharmacy is:  [place], [place] [address] [place] [address] Phone: [phone] Fax: [phone]    Care Plan and Follow Up Patient Decision:  Patient agrees to Care Plan and Follow-up.  Plan: The patient has been provided with contact information for the care management team and has been advised to call with any health related questions or concerns.   CPP F/U May 2024  [place] Constable, Sherian Rein.D. - [phone]

## 2022-07-17 NOTE — Patient Instructions (Signed)
Visit Information   Goals Addressed   None    Patient Care Plan: CCM Pharmacy Care Plan     Problem Identified: DM, HTN, HLD, BPH, tremor, osteoarthritis, depression, anxiety   Priority: High     Long-Range Goal: Disease Management   Start Date: 12/24/2020  Recent Progress: On track  Priority: High  Note:   Current Barriers:  Unable to independently afford treatment regimen Unable to independently monitor therapeutic efficacy Does not adhere to prescribed medication regimen Does not maintain contact with provider office  Pharmacist Clinical Goal(s):  Patient will verbalize ability to afford treatment regimen achieve adherence to monitoring guidelines and medication adherence to achieve therapeutic efficacy maintain control of diabetes as evidenced by lab values, SMBG readings  adhere to prescribed medication regimen as evidenced by fill dates through collaboration with PharmD and provider.   Interventions: 1:1 collaboration with [name], DO regarding development and update of comprehensive plan of care as evidenced by provider attestation and co-signature Inter-disciplinary care team collaboration (see longitudinal plan of care) Comprehensive medication review performed; medication list updated in electronic medical record  Hypertension (BP goal <130/80) BP Readings from Last 3 Encounters:  10/26/21 124/76  07/29/21 125/78  07/26/21 126/71  -Not ideally controlled -Current treatment: Carvedilol 6.25 mg bid Appropriate, Effective, Safe, Accessible Enalapril 10 mg qd Appropriate, Effective, Safe, Accessible HCTZ 12.74m Appropriate, Effective, Safe, Accessible -Medications previously tried: NA -Current home readings: 134/78, --Denies hypotensive/hypertensive symptoms -Educated on BP goals and benefits of medications for prevention of heart attack, stroke and kidney damage; Daily salt intake goal < 2300 mg; Exercise goal of 150 minutes per week; Symptoms of  hypotension and importance of maintaining adequate hydration; -Counseled to monitor BP at home 3 times weekly, document, and provide log at future appointments December 2023: Non-compliant on meds. Unable to go over today because patient was, "On his way out the door."   Hyperlipidemia: (LDL goal < 70) The ASCVD Risk score (Arnett DK, et al., 2019) failed to calculate for the following reasons:   The 2019 ASCVD risk score is only valid for ages 445to 729Lab Results  Component Value Date   CHOL 179 07/26/2021   CHOL 149 02/01/2021   CHOL 138 05/27/2020   Lab Results  Component Value Date   HDL 45 07/26/2021   HDL 47 02/01/2021   HDL 50 05/27/2020   Lab Results  Component Value Date   LDLCALC 109 (H) 07/26/2021   LDLCALC 79 02/01/2021   LDLCALC 73 05/27/2020   Lab Results  Component Value Date   TRIG 142 07/26/2021   TRIG 127 02/01/2021   TRIG 77 05/27/2020   Lab Results  Component Value Date   CHOLHDL 2.7 05/07/2018   CHOLHDL 4.2 08/17/2015   CHOLHDL 3.1 05/11/2015  No results found for: "LDLDIRECT" Last vitamin D No results found for: "25OHVITD2", "25OHVITD3", "VD25OH" Lab Results  Component Value Date   TSH 4.020 07/26/2021  -Controlled -Current treatment: Lovastatin 40 mg qd Query Appropriate,  -Medications previously tried: NA  --Educated on Cholesterol goals;  Benefits of statin for ASCVD risk reduction; -Counseled on diet and exercise extensively December 2023: Recommend high intensity statin   Diabetes/ neuropathy (A1c goal <7%) Lab Results  Component Value Date   HGBA1C 6.2 (H) 10/26/2021   HGBA1C 7.1 (H) 07/26/2021   HGBA1C 6.2 02/01/2021   Lab Results  Component Value Date   MICROALBUR 80 (H) 02/01/2021   LDLCALC 109 (H) 07/26/2021   CREATININE 1.15 07/26/2021  Lab Results  Component Value Date   NA 137 07/26/2021   K 3.8 07/26/2021   CREATININE 1.15 07/26/2021   EGFR 65 07/26/2021   GFRNONAA >60 11/02/2020   GLUCOSE 151 (H)  07/26/2021   Lab Results  Component Value Date   WBC 7.1 07/26/2021   HGB 14.8 07/26/2021   HCT 44.8 07/26/2021   MCV 86 07/26/2021   PLT 269 07/26/2021   Lab Results  Component Value Date   LABMICR See below: 06/10/2021   LABMICR See below: 05/02/2021   MICROALBUR 80 (H) 02/01/2021   MICROALBUR 10 12/23/2019  -Controlled -Current medications: Metformin xr 1000 mg bid Appropriate, Effective, Safe, Accessible Basaglar 15 u qd Appropriate, Effective, Safe, Accessible 2023: PAP Approved 2024: Working on PAP (December 2023) -Medications previously tried: NA -Current home glucose readings fasting glucose: 125-130, -Denies hypoglycemic/hyperglycemic symptoms  -Current exercise: very active, no structured exercise regimen -Educated on Complications of diabetes including kidney damage, retinal damage, and cardiovascular disease; Exercise goal of 150 minutes per week; Prevention and management of hypoglycemic episodes; Benefits of routine self-monitoring of blood sugar; -Counseled to check feet daily and get yearly eye exams completed eye exam 12/09/20 no retinopathy -Counseled on diet and exercise extensively December 2023: Will coordinate to make sure PAP renewed  Patient Goals/Self-Care Activities Patient will:  - take medications as prescribed focus on medication adherence by using pill box check glucose daily, document, and provide at future appointments check blood pressure 3 times weekly, document, and provide at future appointments collaborate with provider on medication access solutions target a minimum of 150 minutes of moderate intensity exercise weekly  Follow Up Plan: Telephone follow up appointment with care management team member scheduled for: CPA 4-6 weeks PharmD 2 months.         Rick [name] was given information about Chronic Care Management services today including:  CCM service includes personalized support from designated clinical staff supervised by  his physician, including individualized plan of care and coordination with other care providers 24/7 contact phone numbers for assistance for urgent and routine care needs. Standard insurance, coinsurance, copays and deductibles apply for chronic care management only during months in which we provide at least 20 minutes of these services. Most insurances cover these services at 100%, however patients may be responsible for any copay, coinsurance and/or deductible if applicable. This service may help you avoid the need for more expensive face-to-face services. Only one practitioner may furnish and bill the service in a calendar month. The patient may stop CCM services at any time (effective at the end of the month) by phone call to the office staff.  Patient agreed to services and verbal consent obtained.   The patient verbalized understanding of instructions, educational materials, and care plan provided today and DECLINED offer to receive copy of patient instructions, educational materials, and care plan.  The pharmacy team will reach out to the patient again over the next 45 days.   Rick [name], [place]

## 2022-07-17 NOTE — Telephone Encounter (Signed)
Patient spoke with Ovid Curd and requested refill. Patient has not been seen since April. Please find out if he is still being seen here and if he is, he needs a follow up before he gets refills

## 2022-07-18 ENCOUNTER — Telehealth: Payer: Self-pay | Admitting: Family Medicine

## 2022-07-18 NOTE — Telephone Encounter (Signed)
PT came in to get patient assistance form filled out but provider stated that he had to be seen.  Scheduled him an appointment for Thursday Dec 7th.  Paperwork is in Set designer.

## 2022-07-18 NOTE — Telephone Encounter (Signed)
Pt scheduled 12/7

## 2022-07-20 ENCOUNTER — Encounter: Payer: Self-pay | Admitting: Family Medicine

## 2022-07-20 ENCOUNTER — Ambulatory Visit (INDEPENDENT_AMBULATORY_CARE_PROVIDER_SITE_OTHER): Payer: Medicare Other | Admitting: Family Medicine

## 2022-07-20 DIAGNOSIS — Z538 Procedure and treatment not carried out for other reasons: Secondary | ICD-10-CM

## 2022-07-20 NOTE — Progress Notes (Signed)
Patient has established with a new PCP at [facility]. He would like to continue to see them. Appointment cancelled today. Patient continue to follow with new PCP

## 2022-07-21 NOTE — Telephone Encounter (Signed)
Patient assistance forms reviewed.  Patient has obtained a new PCP and will need to have documents completed by new provider. Patient acknowledged understanding.

## 2022-08-16 ENCOUNTER — Other Ambulatory Visit: Payer: Self-pay | Admitting: Family Medicine

## 2022-08-17 NOTE — Telephone Encounter (Signed)
Requested medications are due for refill today.  yes  Requested medications are on the active medications list.  yes  Last refill. 02/06/2022 #90 1 rf  Future visit scheduled.   no  Notes to clinic.   Entzminger, Mendel Corning, MD  listed as PCP.     Requested Prescriptions  Pending Prescriptions Disp Refills   hydrochlorothiazide (HYDRODIURIL) 12.5 MG tablet [Pharmacy Med Name: HYDROCHLOROTHIAZIDE 12.5 MG TAB] 90 tablet 1    Sig: TAKE 1 TABLET BY MOUTH DAILY. TAKE AS NEEDED.     Cardiovascular: Diuretics - Thiazide Failed - 08/16/2022  2:42 PM      Failed - Cr in normal range and within 180 days    Creatinine  Date Value Ref Range Status  11/28/2012 0.94 0.60 - 1.30 mg/dL Final   Creatinine, Ser  Date Value Ref Range Status  07/26/2021 1.15 0.76 - 1.27 mg/dL Final         Failed - K in normal range and within 180 days    Potassium  Date Value Ref Range Status  07/26/2021 3.8 3.5 - 5.2 mmol/L Final  11/28/2012 4.1 3.5 - 5.1 mmol/L Final         Failed - Na in normal range and within 180 days    Sodium  Date Value Ref Range Status  07/26/2021 137 134 - 144 mmol/L Final  11/28/2012 138 136 - 145 mmol/L Final         Failed - Valid encounter within last 6 months    Recent Outpatient Visits           4 weeks ago Appointment canceled by hospital   [facility], Megan P, DO   9 months ago Viral upper respiratory tract infection   [facility] Vigg, Avanti, MD   9 months ago Diabetes mellitus without complication ([place])   [facility] Vigg, Avanti, MD   1 year ago Diabetes mellitus without complication ([place])   [facility] Vigg, Avanti, MD   1 year ago Essential hypertension   [place], [place], DO       Future Appointments             In 4 days Ralene Bathe, MD [place] BP in normal range    BP Readings from Last 1 Encounters:  10/26/21  124/76

## 2022-08-21 ENCOUNTER — Telehealth: Payer: Self-pay

## 2022-08-21 ENCOUNTER — Ambulatory Visit: Payer: Medicare HMO | Admitting: Dermatology

## 2022-08-21 NOTE — Telephone Encounter (Signed)
Pt left VM. N/a. Unable to leave VM.

## 2022-08-21 NOTE — Telephone Encounter (Signed)
No answered. Will try later.
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
## 2022-08-25 ENCOUNTER — Telehealth: Payer: Self-pay
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
## 2022-12-06 ENCOUNTER — Other Ambulatory Visit: Payer: Self-pay | Admitting: Family Medicine

## 2022-12-06 DIAGNOSIS — I10 Essential (primary) hypertension: Secondary | ICD-10-CM

## 2022-12-07 NOTE — Telephone Encounter (Signed)
Requested medication (s) are due for refill today: routing for review  Requested medication (s) are on the active medication list: yes  Last refill:  07/26/21  Future visit scheduled: no  Notes to clinic:  Unable to refill per protocol, Rx request was lasted refilled 07/26/21, should patient continue to take. Routing for approval.      Requested Prescriptions  Pending Prescriptions Disp Refills   carvedilol (COREG) 6.25 MG tablet [Pharmacy Med Name: CARVEDILOL 6.25 MG TAB] 180 tablet 1    Sig: TAKE 1 TABLET BY MOUTH TWICE A DAY     Cardiovascular: Beta Blockers 3 Failed - 12/06/2022  4:30 PM      Failed - Cr in normal range and within 360 days    Creatinine  Date Value Ref Range Status  11/28/2012 0.94 0.60 - 1.30 mg/dL Final   Creatinine, Ser  Date Value Ref Range Status  07/26/2021 1.15 0.76 - 1.27 mg/dL Final         Failed - AST in normal range and within 360 days    AST  Date Value Ref Range Status  07/26/2021 24 0 - 40 IU/L Final         Failed - ALT in normal range and within 360 days    ALT  Date Value Ref Range Status  07/26/2021 19 0 - 44 IU/L Final         Failed - Valid encounter within last 6 months    Recent Outpatient Visits           4 months ago Appointment canceled by hospital   [facility] [place], Megan P, DO   1 year ago Viral upper respiratory tract infection   [place] [facility] Vigg, Avanti, MD   1 year ago Diabetes mellitus without complication (HCC)   [place] [facility] Vigg, Avanti, MD   1 year ago Diabetes mellitus without complication [facility])   [place] [facility] Vigg, Avanti, MD   1 year ago Essential hypertension   [place] [facility] [place], Megan P, DO              Passed - Last BP in normal range    BP Readings from Last 1 Encounters:  10/26/21 124/76         Passed - Last Heart Rate in normal range    Pulse Readings from  Last 1 Encounters:  10/26/21 83

## 2022-12-25 ENCOUNTER — Telehealth: Payer: Medicare Other

## 2023-02-19 ENCOUNTER — Other Ambulatory Visit: Payer: Self-pay | Admitting: Family Medicine

## 2023-02-19 NOTE — Telephone Encounter (Signed)
Requested medication (s) are due for refill today - yes  Requested medication (s) are on the active medication list -yes  Future visit scheduled -no  Last refill: 03/17/22 #270 1RF  Notes to clinic: fails lab protocol- over 1 year-07/26/21, patient has another provider listed as PCP  Requested Prescriptions  Pending Prescriptions Disp Refills   carbidopa-levodopa (SINEMET IR) 25-100 MG tablet [Pharmacy Med Name: CARBIDOPA-LEVODOPA 25-100 MG TAB] 270 tablet 1    Sig: TAKE ONE TABLET BY MOUTH THREE TIMES A DAY     Neurology:  Parkinsonian Agents 2 Failed - 02/19/2023  9:31 AM      Failed - WBC in normal range and within 360 days    WBC  Date Value Ref Range Status  07/26/2021 7.1 3.4 - 10.8 x10E3/uL Final  11/02/2020 9.2 4.0 - 10.5 K/uL Final         Failed - PLT in normal range and within 360 days    Platelets  Date Value Ref Range Status  07/26/2021 269 150 - 450 x10E3/uL Final         Failed - HGB in normal range and within 360 days    Hemoglobin  Date Value Ref Range Status  07/26/2021 14.8 13.0 - 17.7 g/dL Final         Failed - HCT in normal range and within 360 days    Hematocrit  Date Value Ref Range Status  07/26/2021 44.8 37.5 - 51.0 % Final         Failed - Cr in normal range and within 360 days    Creatinine  Date Value Ref Range Status  11/28/2012 0.94 0.60 - 1.30 mg/dL Final   Creatinine, Ser  Date Value Ref Range Status  07/26/2021 1.15 0.76 - 1.27 mg/dL Final         Failed - AST in normal range and within 360 days    AST  Date Value Ref Range Status  07/26/2021 24 0 - 40 IU/L Final         Failed - ALT in normal range and within 360 days    ALT  Date Value Ref Range Status  07/26/2021 19 0 - 44 IU/L Final         Failed - Valid encounter within last 12 months    Recent Outpatient Visits           7 months ago Appointment canceled by hospital   [place] [facility] [place], Megan P, DO   1 year ago Viral upper respiratory  tract infection   [place] [facility] Vigg, Avanti, MD   1 year ago Diabetes mellitus without complication (HCC)   [place] [facility] Vigg, Avanti, MD   1 year ago Diabetes mellitus without complication (HCC)   [facility] Vigg, Avanti, MD   1 year ago Essential hypertension   [place] [facility] [place], Megan P, DO              Passed - Last BP in normal range    BP Readings from Last 1 Encounters:  10/26/21 124/76            Requested Prescriptions  Pending Prescriptions Disp Refills   carbidopa-levodopa (SINEMET IR) 25-100 MG tablet [Pharmacy Med Name: CARBIDOPA-LEVODOPA 25-100 MG TAB] 270 tablet 1    Sig: TAKE ONE TABLET BY MOUTH THREE TIMES A DAY     Neurology:  Parkinsonian Agents 2 Failed - 02/19/2023  9:31 AM      Failed - WBC in normal range and within 360 days    WBC  Date Value Ref Range Status  07/26/2021 7.1 3.4 - 10.8 x10E3/uL Final  11/02/2020 9.2 4.0 - 10.5 K/uL Final         Failed - PLT in normal range and within 360 days    Platelets  Date Value Ref Range Status  07/26/2021 269 150 - 450 x10E3/uL Final         Failed - HGB in normal range and within 360 days    Hemoglobin  Date Value Ref Range Status  07/26/2021 14.8 13.0 - 17.7 g/dL Final         Failed - HCT in normal range and within 360 days    Hematocrit  Date Value Ref Range Status  07/26/2021 44.8 37.5 - 51.0 % Final         Failed - Cr in normal range and within 360 days    Creatinine  Date Value Ref Range Status  11/28/2012 0.94 0.60 - 1.30 mg/dL Final   Creatinine, Ser  Date Value Ref Range Status  07/26/2021 1.15 0.76 - 1.27 mg/dL Final         Failed - AST in normal range and within 360 days    AST  Date Value Ref Range Status  07/26/2021 24 0 - 40 IU/L Final         Failed - ALT in normal range and within 360 days    ALT  Date Value Ref Range Status  07/26/2021 19 0 - 44 IU/L Final          Failed - Valid encounter within last 12 months    Recent Outpatient Visits           7 months ago Appointment canceled by hospital   [place] [facility] [place], Megan P, DO   1 year ago Viral upper respiratory tract infection   [place] [facility] Vigg, Avanti, MD   1 year ago Diabetes mellitus without complication (HCC)   [place] [facility] Vigg, Avanti, MD   1 year ago Diabetes mellitus without complication [facility])   [place] [facility] Vigg, Avanti, MD   1 year ago Essential hypertension   [place] [facility] [place], Megan P, DO              Passed - Last BP in normal range    BP Readings from Last 1 Encounters:  10/26/21 124/76

## 2023-02-19 NOTE — Telephone Encounter (Signed)
Wrong office

## 2023-02-20 NOTE — Telephone Encounter (Signed)
PCP: Louis Matte, MD - listed will forward back to pharmacy   Requested Prescriptions  Refused Prescriptions Disp Refills   carbidopa-levodopa (SINEMET IR) 25-100 MG tablet [Pharmacy Med Name: CARBIDOPA-LEVODOPA 25-100 MG TAB] 270 tablet 1    Sig: TAKE ONE TABLET BY MOUTH THREE TIMES A DAY     Neurology:  Parkinsonian Agents 2 Failed - 02/19/2023  4:23 PM      Failed - WBC in normal range and within 360 days    WBC  Date Value Ref Range Status  07/26/2021 7.1 3.4 - 10.8 x10E3/uL Final  11/02/2020 9.2 4.0 - 10.5 K/uL Final         Failed - PLT in normal range and within 360 days    Platelets  Date Value Ref Range Status  07/26/2021 269 150 - 450 x10E3/uL Final         Failed - HGB in normal range and within 360 days    Hemoglobin  Date Value Ref Range Status  07/26/2021 14.8 13.0 - 17.7 g/dL Final         Failed - HCT in normal range and within 360 days    Hematocrit  Date Value Ref Range Status  07/26/2021 44.8 37.5 - 51.0 % Final         Failed - Cr in normal range and within 360 days    Creatinine  Date Value Ref Range Status  11/28/2012 0.94 0.60 - 1.30 mg/dL Final   Creatinine, Ser  Date Value Ref Range Status  07/26/2021 1.15 0.76 - 1.27 mg/dL Final         Failed - AST in normal range and within 360 days    AST  Date Value Ref Range Status  07/26/2021 24 0 - 40 IU/L Final         Failed - ALT in normal range and within 360 days    ALT  Date Value Ref Range Status  07/26/2021 19 0 - 44 IU/L Final         Failed - Valid encounter within last 12 months    Recent Outpatient Visits           7 months ago Appointment canceled by hospital   [place] [facility] [place], Megan P, DO   1 year ago Viral upper respiratory tract infection   [place] [facility] Vigg, Avanti, MD   1 year ago Diabetes mellitus without complication (HCC)   [place] [facility] Vigg, Avanti, MD   1 year ago Diabetes  mellitus without complication [facility])   [place] [facility] Vigg, Avanti, MD   1 year ago Essential hypertension   [place] [facility] [place], Megan P, DO              Passed - Last BP in normal range    BP Readings from Last 1 Encounters:  10/26/21 124/76

## 2023-04-10 NOTE — Progress Notes (Signed)
 Today the history is gathered from: 100% - patient  0% - patient alone today   RECORDS SUMMARY: No new outside records on file.   REFERRING PHYSICIAN: Lane Arthea Locus, MD PRIMARY CARE PHYSICIAN:  Vicci Duwaine SQUIBB, DO  IMPRESSION/PLAN  Rick [name] is a 82 y.o.male presenting for evaluation of  TREMORS/ DROOLING/ IMBALANCE/ - Stable. - Patient with no new changes. Reports ongoing tremor in left hand and right leg. Unchanged imbalance and gait difficulty. No recent falls.  - Symptoms may be consistent with medication induced parkinsonism given left hand tremor predominant when walking.  - Symptoms not consistent with Parkinson's, negative for hypophonia, one sided stiffness, bradykinesia, tremor worse with movement.  - Increase Sinemet  25/100 mg to 2 pill three times daily.  - Can consider focused US  at future visit. - Can consider restarting primidone for tremors. - Can consider PT for gait and balance at future visit. Patient deferred today. - Encouraged patient to stay physically active and exercise on regular basis (90 mins per week or 15 mins per day). Exercise can be very beneficial in many neurological conditions.   Follow up in 3-4 months with Dr. Lane   CHIEF COMPLAIN & HISTORY OF PRESENT ILLNESS:  [name] is a 82 y.o. male presenting for evaluation of: Chief Complaint  Patient presents with  . TREMORS/ MEMORY LOSS    TREMORS/ DROOLING/ IMBALANCE/  Patient is reporting with no changes in symptoms, ongoing tremor in left hand and right leg.  Ongoing difficulties with balance and gait, ambulates with a cane. Reports frequent falls. Endorses that he keeps going downhill. Taking Sinemet  25-100 mg 1 tablet three times daily. Denies any side effects.   DATA SUMMARY: No pertinent data.   VISIT SUMMARY: 04/27/21: Decrease Carbidopa  25/100 mg to one tablet twice daily.  11/27/2019: Patient with tremor and drooling, stable. New memory difficulty.  Decrease Sinemet  to  25/100 three times per day. 02/11/2019: Patient with tremor, improving. Continue Sinemet  25/100 four times daily.  09/16/2018: Patient with worsening tremors in the left arm, jaw, and voice. Start Sinemet  25/100 three daily. 06/04/18: Tremors worse. Symptoms most likely consistent with benign essential tremor. Start Primidone 50 mg, 1 tab nightly for one week then increase to 50 mg twice daily.  06/11/17: Tremors new to me. Symptoms most consistent with carpal tunnel or ulnar nerve impingement. Refer for EMG of upper extremity to evaluate for possible CTS. Recommend patient wear a left wrist splint for 1 month, then wear at night only.   MEDICATIONS Current Outpatient Medications  Medication Sig Dispense Refill  . carbidopa -levodopa  (SINEMET ) 25-100 mg tablet Take 1 tablet by mouth 3 (three) times daily 270 tablet 1  . enalapril  (VASOTEC ) 10 MG tablet Take 2 tablets (20 mg total) by mouth every morning. 180 tablet 3  . finasteride  (PROSCAR ) 5 mg tablet Take by mouth. Take 1 tablet (5 mg total) by mouth daily.    . gabapentin  (NEURONTIN ) 300 MG capsule Take by mouth. Take 1 capsule (300 mg total) by mouth 2 (two) times daily.    . glimepiride  (AMARYL ) 4 MG tablet Take by mouth. Take 1 tablet (4 mg total) by mouth daily before breakfast    . glipiZIDE  (GLUCOTROL ) 10 MG tablet Take 10 mg by mouth every morning before breakfast    . hydroCHLOROthiazide  (HYDRODIURIL ) 25 MG tablet Take by mouth. Take 1 tablet (25 mg total) by mouth daily.    . lovastatin  (MEVACOR ) 40 MG tablet Take 40 mg by mouth daily with  dinner    . metFORMIN  (GLUCOPHAGE ) 500 MG tablet Take 1,000 mg by mouth 2 (two) times daily with meals    . traZODone  (DESYREL ) 100 MG tablet TAKE ONE TABLET AT BEDTIME 30 tablet 11   No current facility-administered medications for this visit.    ALLERGIES Allergies  Allergen Reactions  . Blood-Group Specific Substance Unknown  . Haloperidol Unknown  . Sulfa (Sulfonamide Antibiotics)  Unknown    Other reaction(s): OTHER     EXAM   Vitals:   04/10/23 1432  BP: (!) 161/91  Pulse: 95  Weight: 77.1 kg (170 lb)  Height: 175.3 cm (5' 9)  PainSc: 0-No pain    Body mass index is 25.1 kg/m.  GENERAL: Pleasant male, NAD Normocephalic and atraumatic.   Baseline neurological exam below was obtained at prior office visit. Changes from today's visit appear in bold.   MUSCULOSKELETAL: Bulk - Normal Tone - Normal Pronator Drift - Absent bilaterally. Ambulation - Gait and station are mildly imbalanced. Ambulating without assistance.  No stooped posture, tremor with ambulation. Mild shuffling gait. Left hand tremor prominent when walking. Romberg - deferred  Tremor - moderate left arm tremor, moderate jaw tremor, vocal tremor. Tremor worse while walking. Left arm tremor improve when holding an object. 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  R/L 5/5    Shoulder abduction (deltoid/supraspinatus, axillary/suprascapular n, C5) 5/5    Elbow flexion (biceps brachii, musculoskeletal n, C5-6) 5/5    Elbow extension (triceps, radial n, C7) 5/5    Finger adduction (interossei, ulnar n, T1)  5/5    Hip flexion (iliopsoas, L1/L2) 5/5    Knee flexion (hamstrings, sciatic n, L5/S1)  5/5    Knee extension (quadriceps, femoral n, L3/4) 5/5    Ankle dorsiflexion (tibialis anterior, deep fibular n, L4/5) 5/5    Ankle plantarflexion (gastroc, tibial n, S1)   NEUROLOGICAL: MENTAL STATUS: Patient is oriented to person, place and time.   Short-term memory is appropriate for age Long-term memory is appropriate for age.   Attention span and concentration are intact.   Naming and repetition are intact. Comprehension is intact.   Expressive speech is intact.   Patient's fund of knowledge is within normal limits for educational level.  CRANIAL NERVES: Visual acuity and visual fields are intact         Extraocular muscles are intact                        Facial sensation is intact  bilaterally                Facial strength is intact bilaterally                   Hearing is moderately diminished bilaterally                              Palate elevates midline, normal phonation     Shoulder shrug strength is intact                    Tongue protrudes midline                       SENSATION: Decreased sensation to all modalities in a length dependent gradient in the lower extremities.  Otherwise, sensation is within normal limits to all modalities throughout.   REFLEXES: R/L 2+/2+    Biceps 2+/2+  Brachioradialis  2+/2+    Patellar 2+/2+    Achilles  COORDINATION/CEREBELLAR: Finger to nose testing is deferred      PAST MEDICAL HISTORY Past Medical History:  Diagnosis Date  . Anxiety and depression    severe  . Bladder cancer (CMS/HHS-HCC)    multiple surgeries  . Dysrhythmia   . Fracture, ankle 10/01/2015   closed trimalleolar, left   . Gangrenous appendicitis   . Herpes genitalis in men   . History of alcoholism (CMS/HHS-HCC)   . History of migraine   . History of urinary disorder 03/27/2013  . Hyperlipidemia   . Hypertension   . Insomnia   . Kidney stones   . Mastoiditis    post resection in distant past  . Nephrolithiasis   . Peripheral neuropathy   . Skin cancer   . Tremor   . Type 2 diabetes mellitus (CMS/HHS-HCC)   . UTI (urinary tract infection)     PAST SURGICAL HISTORY Past Surgical History:  Procedure Laterality Date  . ORIF of medial and lateral malleolar fractures, left ankle  Left 09/27/2015   Dr.Poggi   . HERNIA REPAIR  01/14/2016   [facility]  ---   Dr Unknown Sharps   . CYSTOSCOPY  03/22/2018  . transurethral resection of prostate  03/22/2018  . APPENDECTOMY    . CHOLECYSTECTOMY    . COLOSTOMY    . cyst on testicle    . multiple surgeries for bladder cancer    . skin cancer excision    . status post mastoid resection in distant past      FAMILY HISTORY Family History  Problem Relation Name Age of Onset  . Stroke  Mother    . Colon cancer Father    . Diabetes Father    . Lung cancer Sister    . Cancer Brother      SOCIAL HISTORY  Social History   Tobacco Use  . Smoking status: Former    Current packs/day: 0.00    Average packs/day: 1.5 packs/day for 15.0 years (22.5 ttl pk-yrs)    Types: Cigarettes    Start date: 08/14/1994    Quit date: 03/23/2005    Years since quitting: 18.0  . Smokeless tobacco: Former    Types: Snuff, [place]    Quit date: 03/23/2010  Substance Use Topics  . Alcohol use: No     REVIEW OF SYSTEMS:  13 system ROS was verbally reviewed with patient. Pertinent positives and negatives are mentioned above in the HPI and all other systems are negative.   DATA   No visits with results within 6 Month(s) from this visit.  Latest known visit with results is:  Office Visit on 05/18/2014  Component Date Value Ref Range Status  . Glucose 05/18/2014 124 (H)  70 - 110 mg/dL Final  . Sodium 89/94/7984 138  136 - 145 mmol/L Final  . Potassium 05/18/2014 4.1  3.6 - 5.1 mmol/L Final  . Chloride 05/18/2014 101  97 - 109 mmol/L Final  . Carbon Dioxide (CO2) 05/18/2014 28.0  22.0 - 32.0 mmol/L Final  . Calcium 05/18/2014 10.2  8.7 - 10.3 mg/dL Final  . Urea Nitrogen (BUN) 05/18/2014 14  7 - 25 mg/dL Final  . Creatinine 89/94/7984 1.0  0.7 - 1.3 mg/dL Final  . Glomerular Filtration Rate (eGFR) 05/18/2014 73  >60 mL/min/1.73sq m Final  . BUN/Crea Ratio 05/18/2014 14.0  6.0 - 20.0 Final  . Anion Gap w/K 05/18/2014 13.1  6.0 - 16.0 Final  .  Hemoglobin A1C 05/18/2014 6.5 (H)  4.2 - 5.6 % Final  . Average Blood Glucose (Calc) 05/18/2014 140  mg/dL Final  . Cholesterol, Total 05/18/2014 175  100 - 200 mg/dL Final  . Triglyceride 89/94/7984 178  35 - 199 mg/dL Final  . HDL (High Density Lipoprotein) Cho* 05/18/2014 44.6  29.0 - 71.0 mg/dL Final  . LDL Calculated 05/18/2014 95  0 - 130 mg/dL Final  . VLDL Cholesterol 05/18/2014 36  mg/dL Final  . Cholesterol/HDL Ratio 05/18/2014 3.9    Final  . Protein, Total 05/18/2014 7.5  6.1 - 7.9 g/dL Final  . Albumin 89/94/7984 4.7  3.5 - 4.8 g/dL Final  . Bilirubin, Total 05/18/2014 0.9  0.3 - 1.2 mg/dL Final  . Bilirubin, Conjugated 05/18/2014 0.20  0.00 - 0.20 mg/dL Final  . Alk Phos (alkaline Phosphatase) 05/18/2014 43  34 - 104 U/L Final  . AST  05/18/2014 19  8 - 39 U/L Final  . ALT  05/18/2014 21  6 - 57 U/L Final  . Urine Albumin, Random 05/18/2014 10  0 - 19 mg/L Final   No follow-ups on file.  Payor: [employer]ICARE ADVANTAGE PLANS / Plan: [employer] PPO CHOICE / Product Type: PPO /   This note is partially written by Greig Pouch, scribe, in the presence of and acting as the scribe of Dr. Arthea Farrow.   I have reviewed, edited and added to the note as needed to reflect my best personal medical judgment.    Dr. Arthea Farrow, MD [facility] [place], [place] Ph:  [phone] Fax:  [phone]

## 2024-03-10 ENCOUNTER — Emergency Department

## 2024-03-10 ENCOUNTER — Other Ambulatory Visit: Payer: Self-pay

## 2024-03-10 ENCOUNTER — Inpatient Hospital Stay
Admission: EM | Admit: 2024-03-10 | Discharge: 2024-03-19 | DRG: 689 | Disposition: A | Discharge status: Home Health | Attending: Internal Medicine | Admitting: Internal Medicine

## 2024-03-10 DIAGNOSIS — Z1612 Extended spectrum beta lactamase (ESBL) resistance: Secondary | ICD-10-CM | POA: Diagnosis present

## 2024-03-10 DIAGNOSIS — Z8659 Personal history of other mental and behavioral disorders: Secondary | ICD-10-CM | POA: Diagnosis not present

## 2024-03-10 DIAGNOSIS — Z8551 Personal history of malignant neoplasm of bladder: Secondary | ICD-10-CM | POA: Diagnosis not present

## 2024-03-10 DIAGNOSIS — Z833 Family history of diabetes mellitus: Secondary | ICD-10-CM

## 2024-03-10 DIAGNOSIS — G9341 Metabolic encephalopathy: Secondary | ICD-10-CM | POA: Diagnosis present

## 2024-03-10 DIAGNOSIS — I10 Essential (primary) hypertension: Secondary | ICD-10-CM | POA: Diagnosis present

## 2024-03-10 DIAGNOSIS — E1165 Type 2 diabetes mellitus with hyperglycemia: Secondary | ICD-10-CM | POA: Diagnosis present

## 2024-03-10 DIAGNOSIS — Z79899 Other long term (current) drug therapy: Secondary | ICD-10-CM | POA: Diagnosis not present

## 2024-03-10 DIAGNOSIS — H9190 Unspecified hearing loss, unspecified ear: Secondary | ICD-10-CM | POA: Diagnosis present

## 2024-03-10 DIAGNOSIS — I4891 Unspecified atrial fibrillation: Secondary | ICD-10-CM | POA: Diagnosis not present

## 2024-03-10 DIAGNOSIS — E785 Hyperlipidemia, unspecified: Secondary | ICD-10-CM | POA: Diagnosis present

## 2024-03-10 DIAGNOSIS — G9340 Encephalopathy, unspecified: Secondary | ICD-10-CM | POA: Diagnosis not present

## 2024-03-10 DIAGNOSIS — F05 Delirium due to known physiological condition: Secondary | ICD-10-CM | POA: Diagnosis present

## 2024-03-10 DIAGNOSIS — Z85828 Personal history of other malignant neoplasm of skin: Secondary | ICD-10-CM

## 2024-03-10 DIAGNOSIS — G2119 Other drug induced secondary parkinsonism: Secondary | ICD-10-CM | POA: Diagnosis present

## 2024-03-10 DIAGNOSIS — Z9049 Acquired absence of other specified parts of digestive tract: Secondary | ICD-10-CM

## 2024-03-10 DIAGNOSIS — E119 Type 2 diabetes mellitus without complications: Secondary | ICD-10-CM

## 2024-03-10 DIAGNOSIS — R000 Tachycardia, unspecified: Secondary | ICD-10-CM | POA: Diagnosis present

## 2024-03-10 DIAGNOSIS — F319 Bipolar disorder, unspecified: Secondary | ICD-10-CM | POA: Diagnosis present

## 2024-03-10 DIAGNOSIS — N390 Urinary tract infection, site not specified: Principal | ICD-10-CM | POA: Diagnosis present

## 2024-03-10 DIAGNOSIS — Z8719 Personal history of other diseases of the digestive system: Secondary | ICD-10-CM | POA: Diagnosis not present

## 2024-03-10 DIAGNOSIS — Z794 Long term (current) use of insulin: Secondary | ICD-10-CM

## 2024-03-10 DIAGNOSIS — E538 Deficiency of other specified B group vitamins: Secondary | ICD-10-CM | POA: Diagnosis not present

## 2024-03-10 DIAGNOSIS — J069 Acute upper respiratory infection, unspecified: Secondary | ICD-10-CM

## 2024-03-10 DIAGNOSIS — N179 Acute kidney failure, unspecified: Secondary | ICD-10-CM | POA: Diagnosis present

## 2024-03-10 DIAGNOSIS — B9629 Other Escherichia coli [E. coli] as the cause of diseases classified elsewhere: Secondary | ICD-10-CM | POA: Diagnosis present

## 2024-03-10 DIAGNOSIS — G20A1 Parkinson's disease without dyskinesia, without mention of fluctuations: Secondary | ICD-10-CM | POA: Diagnosis present

## 2024-03-10 DIAGNOSIS — R7989 Other specified abnormal findings of blood chemistry: Secondary | ICD-10-CM | POA: Diagnosis present

## 2024-03-10 DIAGNOSIS — B9620 Unspecified Escherichia coli [E. coli] as the cause of diseases classified elsewhere: Secondary | ICD-10-CM | POA: Diagnosis present

## 2024-03-10 DIAGNOSIS — R410 Disorientation, unspecified: Secondary | ICD-10-CM

## 2024-03-10 DIAGNOSIS — E876 Hypokalemia: Secondary | ICD-10-CM | POA: Diagnosis present

## 2024-03-10 DIAGNOSIS — E871 Hypo-osmolality and hyponatremia: Secondary | ICD-10-CM | POA: Diagnosis present

## 2024-03-10 DIAGNOSIS — N400 Enlarged prostate without lower urinary tract symptoms: Secondary | ICD-10-CM | POA: Diagnosis present

## 2024-03-10 DIAGNOSIS — Z87891 Personal history of nicotine dependence: Secondary | ICD-10-CM

## 2024-03-10 DIAGNOSIS — Z7984 Long term (current) use of oral hypoglycemic drugs: Secondary | ICD-10-CM

## 2024-03-10 DIAGNOSIS — Z882 Allergy status to sulfonamides status: Secondary | ICD-10-CM

## 2024-03-10 DIAGNOSIS — G20C Parkinsonism, unspecified: Secondary | ICD-10-CM | POA: Diagnosis not present

## 2024-03-10 DIAGNOSIS — Z888 Allergy status to other drugs, medicaments and biological substances status: Secondary | ICD-10-CM

## 2024-03-10 DIAGNOSIS — I480 Paroxysmal atrial fibrillation: Secondary | ICD-10-CM | POA: Diagnosis present

## 2024-03-10 DIAGNOSIS — Z046 Encounter for general psychiatric examination, requested by authority: Principal | ICD-10-CM

## 2024-03-10 DIAGNOSIS — Z9079 Acquired absence of other genital organ(s): Secondary | ICD-10-CM

## 2024-03-10 DIAGNOSIS — Z87442 Personal history of urinary calculi: Secondary | ICD-10-CM

## 2024-03-10 LAB — COMPREHENSIVE METABOLIC PANEL WITH GFR
ALT: 8 U/L (ref 0–44)
AST: 16 U/L (ref 15–41)
Albumin: 2.6 g/dL — ABNORMAL LOW (ref 3.5–5.0)
Alkaline Phosphatase: 51 U/L (ref 38–126)
Anion gap: 8 (ref 5–15)
BUN: 23 mg/dL (ref 8–23)
CO2: 23 mmol/L (ref 22–32)
Calcium: 8.7 mg/dL — ABNORMAL LOW (ref 8.9–10.3)
Chloride: 109 mmol/L (ref 98–111)
Creatinine, Ser: 0.98 mg/dL (ref 0.61–1.24)
GFR, Estimated: >60 mL/min (ref >60)
Glucose, Bld: 160 mg/dL — ABNORMAL HIGH (ref 70–99)
Potassium: 4.1 mmol/L (ref 3.5–5.1)
Sodium: 140 mmol/L (ref 135–145)
Total Bilirubin: 0.6 mg/dL (ref 0.0–1.2)
Total Protein: 5.6 g/dL — ABNORMAL LOW (ref 6.5–8.1)

## 2024-03-10 LAB — PHOSPHORUS: Phosphorus: 3.1 mg/dL (ref 2.5–4.6)

## 2024-03-10 LAB — CBC
HCT: 39.1 % (ref 39.0–52.0)
Hemoglobin: 12.9 g/dL — ABNORMAL LOW (ref 13.0–17.0)
MCH: 28.3 pg (ref 26.0–34.0)
MCHC: 33 g/dL (ref 30.0–36.0)
MCV: 85.7 fL (ref 80.0–100.0)
Platelets: 220 K/uL (ref 150–400)
RBC: 4.56 MIL/uL (ref 4.22–5.81)
RDW: 14.5 % (ref 11.5–15.5)
WBC: 6.3 K/uL (ref 4.0–10.5)
nRBC: 0 % (ref 0.0–0.2)

## 2024-03-10 LAB — CBC WITH DIFFERENTIAL/PLATELET
Abs Immature Granulocytes: 0.05 K/uL (ref 0.00–0.07)
Basophils Absolute: 0.1 K/uL (ref 0.0–0.1)
Basophils Relative: 1 %
Eosinophils Absolute: 0 K/uL (ref 0.0–0.5)
Eosinophils Relative: 1 %
HCT: 37.4 % — ABNORMAL LOW (ref 39.0–52.0)
Hemoglobin: 12.3 g/dL — ABNORMAL LOW (ref 13.0–17.0)
Immature Granulocytes: 1 %
Lymphocytes Relative: 22 %
Lymphs Abs: 1.5 K/uL (ref 0.7–4.0)
MCH: 28.4 pg (ref 26.0–34.0)
MCHC: 32.9 g/dL (ref 30.0–36.0)
MCV: 86.4 fL (ref 80.0–100.0)
Monocytes Absolute: 0.9 K/uL (ref 0.1–1.0)
Monocytes Relative: 14 %
Neutro Abs: 4.1 K/uL (ref 1.7–7.7)
Neutrophils Relative %: 61 %
Platelets: 198 K/uL (ref 150–400)
RBC: 4.33 MIL/uL (ref 4.22–5.81)
RDW: 14.5 % (ref 11.5–15.5)
WBC: 6.6 K/uL (ref 4.0–10.5)
nRBC: 0 % (ref 0.0–0.2)

## 2024-03-10 LAB — URINALYSIS, ROUTINE W REFLEX MICROSCOPIC
Bilirubin Urine: NEGATIVE
Glucose, UA: NEGATIVE mg/dL
Hgb urine dipstick: NEGATIVE
Ketones, ur: NEGATIVE mg/dL
Nitrite: NEGATIVE
Protein, ur: 30 mg/dL — AB
Specific Gravity, Urine: 1.008 (ref 1.005–1.030)
WBC, UA: >50 WBC/hpf (ref 0–5)
pH: 6 (ref 5.0–8.0)

## 2024-03-10 LAB — URINE DRUG SCREEN, QUALITATIVE (ARMC ONLY)
Amphetamines, Ur Screen: NOT DETECTED
Barbiturates, Ur Screen: NOT DETECTED
Benzodiazepine, Ur Scrn: NOT DETECTED
Cannabinoid 50 Ng, Ur ~~LOC~~: NOT DETECTED
Cocaine Metabolite,Ur ~~LOC~~: NOT DETECTED
MDMA (Ecstasy)Ur Screen: NOT DETECTED
Methadone Scn, Ur: NOT DETECTED
Opiate, Ur Screen: NOT DETECTED
Phencyclidine (PCP) Ur S: NOT DETECTED
Tricyclic, Ur Screen: NOT DETECTED

## 2024-03-10 LAB — MAGNESIUM: Magnesium: 1.8 mg/dL (ref 1.7–2.4)

## 2024-03-10 LAB — BASIC METABOLIC PANEL WITH GFR
Anion gap: 15 (ref 5–15)
BUN: 19 mg/dL (ref 8–23)
CO2: 19 mmol/L — ABNORMAL LOW (ref 22–32)
Calcium: 8.8 mg/dL — ABNORMAL LOW (ref 8.9–10.3)
Chloride: 105 mmol/L (ref 98–111)
Creatinine, Ser: 1.32 mg/dL — ABNORMAL HIGH (ref 0.61–1.24)
GFR, Estimated: 54 mL/min — ABNORMAL LOW (ref >60)
Glucose, Bld: 193 mg/dL — ABNORMAL HIGH (ref 70–99)
Potassium: 3 mmol/L — ABNORMAL LOW (ref 3.5–5.1)
Sodium: 139 mmol/L (ref 135–145)

## 2024-03-10 LAB — TROPONIN I (HIGH SENSITIVITY)
Troponin I (High Sensitivity): 10 ng/L (ref <18)
Troponin I (High Sensitivity): 12 ng/L (ref <18)

## 2024-03-10 LAB — AMMONIA: Ammonia: 18 umol/L (ref 9–35)

## 2024-03-10 LAB — ETHANOL: Alcohol, Ethyl (B): <15 mg/dL (ref <15)

## 2024-03-10 LAB — RAPID HIV SCREEN (HIV 1/2 AB+AG)
HIV 1/2 Antibodies: NONREACTIVE
HIV-1 P24 Antigen - HIV24: NONREACTIVE

## 2024-03-10 LAB — FOLATE: Folate: 25 ng/mL (ref >5.9)

## 2024-03-10 LAB — VITAMIN B12: Vitamin B-12: 137 pg/mL — ABNORMAL LOW (ref 180–914)

## 2024-03-10 LAB — TSH: TSH: 1.437 u[IU]/mL (ref 0.350–4.500)

## 2024-03-10 MED ORDER — THIAMINE HCL 100 MG/ML IJ SOLN
100.0000 mg | Freq: Once | INTRAMUSCULAR | Status: AC
  Administered 2024-03-10: 100 mg via INTRAVENOUS
  Filled 2024-03-10: qty 2

## 2024-03-10 MED ORDER — MIDAZOLAM HCL 2 MG/2ML IJ SOLN
2.0000 mg | Freq: Once | INTRAMUSCULAR | Status: AC
  Administered 2024-03-10: 2 mg via INTRAVENOUS
  Filled 2024-03-10: qty 2

## 2024-03-10 MED ORDER — SODIUM CHLORIDE 0.9 % IV SOLN
1.0000 g | INTRAVENOUS | Status: AC
  Administered 2024-03-10: 1 g via INTRAVENOUS
  Filled 2024-03-10: qty 10

## 2024-03-10 MED ORDER — MORPHINE SULFATE (PF) 2 MG/ML IV SOLN: 2.0000 mg | INTRAVENOUS | Status: DC | PRN

## 2024-03-10 MED ORDER — LORAZEPAM 2 MG/ML IJ SOLN: 2.0000 mg | INTRAMUSCULAR | Status: DC | PRN

## 2024-03-10 MED ORDER — CARBIDOPA-LEVODOPA 25-100 MG PO TABS
1.0000 | ORAL_TABLET | Freq: Three times a day (TID) | ORAL | Status: DC
  Administered 2024-03-10 – 2024-03-19 (×25): 1 via ORAL
  Filled 2024-03-10 (×26): qty 1

## 2024-03-10 MED ORDER — ACETAMINOPHEN 325 MG PO TABS: 650.0000 mg | ORAL_TABLET | Freq: Four times a day (QID) | ORAL | Status: DC | PRN

## 2024-03-10 MED ORDER — INSULIN ASPART 100 UNIT/ML IJ SOLN
0.0000 [IU] | Freq: Three times a day (TID) | INTRAMUSCULAR | Status: DC
  Administered 2024-03-11: 2 [IU] via SUBCUTANEOUS
  Administered 2024-03-11 – 2024-03-12 (×3): 1 [IU] via SUBCUTANEOUS
  Administered 2024-03-12: 2 [IU] via SUBCUTANEOUS
  Administered 2024-03-12: 1 [IU] via SUBCUTANEOUS
  Administered 2024-03-13 (×3): 2 [IU] via SUBCUTANEOUS
  Administered 2024-03-14: 1 [IU] via SUBCUTANEOUS
  Administered 2024-03-14: 3 [IU] via SUBCUTANEOUS
  Administered 2024-03-14 – 2024-03-15 (×2): 2 [IU] via SUBCUTANEOUS
  Administered 2024-03-15: 5 [IU] via SUBCUTANEOUS
  Administered 2024-03-15: 2 [IU] via SUBCUTANEOUS
  Administered 2024-03-16: 3 [IU] via SUBCUTANEOUS
  Administered 2024-03-16: 2 [IU] via SUBCUTANEOUS
  Administered 2024-03-17: 9 [IU] via SUBCUTANEOUS
  Administered 2024-03-17: 2 [IU] via SUBCUTANEOUS
  Administered 2024-03-17: 1 [IU] via SUBCUTANEOUS
  Administered 2024-03-18 (×3): 2 [IU] via SUBCUTANEOUS
  Administered 2024-03-19: 3 [IU] via SUBCUTANEOUS
  Administered 2024-03-19: 2 [IU] via SUBCUTANEOUS
  Filled 2024-03-10 (×24): qty 1

## 2024-03-10 MED ORDER — SODIUM CHLORIDE 0.9 % IV BOLUS
500.0000 mL | Freq: Once | INTRAVENOUS | Status: AC
  Administered 2024-03-10: 500 mL via INTRAVENOUS

## 2024-03-10 MED ORDER — CARVEDILOL 3.125 MG PO TABS
6.2500 mg | ORAL_TABLET | Freq: Two times a day (BID) | ORAL | Status: DC
  Administered 2024-03-10 – 2024-03-13 (×6): 6.25 mg via ORAL
  Filled 2024-03-10 (×2): qty 1
  Filled 2024-03-10 (×2): qty 2
  Filled 2024-03-10 (×2): qty 1

## 2024-03-10 MED ORDER — POTASSIUM CHLORIDE CRYS ER 20 MEQ PO TBCR
40.0000 meq | EXTENDED_RELEASE_TABLET | Freq: Once | ORAL | Status: AC
  Administered 2024-03-10: 40 meq via ORAL
  Filled 2024-03-10: qty 2

## 2024-03-10 MED ORDER — MIDAZOLAM HCL 2 MG/2ML IJ SOLN
2.0000 mg | Freq: Once | INTRAMUSCULAR | Status: AC
  Administered 2024-03-10: 2 mg via INTRAVENOUS
  Filled 2024-03-10 (×2): qty 2

## 2024-03-10 MED ORDER — HYDROCODONE-ACETAMINOPHEN 5-325 MG PO TABS
1.0000 | ORAL_TABLET | Freq: Four times a day (QID) | ORAL | Status: DC | PRN
  Administered 2024-03-12 (×2): 1 via ORAL
  Filled 2024-03-10 (×2): qty 1

## 2024-03-10 MED ORDER — HEPARIN SODIUM (PORCINE) 5000 UNIT/ML IJ SOLN
5000.0000 [IU] | Freq: Three times a day (TID) | INTRAMUSCULAR | Status: DC
  Administered 2024-03-10 – 2024-03-19 (×27): 5000 [IU] via SUBCUTANEOUS
  Filled 2024-03-10 (×27): qty 1

## 2024-03-10 MED ORDER — SODIUM CHLORIDE 0.9 % IV SOLN
1.0000 g | INTRAVENOUS | Status: DC
  Administered 2024-03-11: 1 g via INTRAVENOUS
  Filled 2024-03-10 (×2): qty 10

## 2024-03-10 NOTE — ED Notes (Signed)
 Pt yelling and cursing at staff that he wants to go home. Pt says he will not cooperate if we don't let him go home. Pt unable to recall the year or why he is here.

## 2024-03-10 NOTE — ED Notes (Signed)
 Pt cleaned up by this tech and tech [place] as well as Warehouse manager. Patient provided with new linen, gown, brief and wiped down. Patient was given a warm blanket .

## 2024-03-10 NOTE — H&P (Signed)
 History and Physical    Patient: Rick [name] FMW:[id number] DOB: [birth year] DOA: 03/10/2024 DOS: the patient was seen and examined on 03/10/2024 PCP: Rick [name] LABOR, MD  Patient coming from: EMS via Police  Chief Complaint:  Chief Complaint  Patient presents with   Shortness of Breath   HPI: Rick [name] is a 83 y.o. male with medical history significant of medication induced parkinsonism on carbidopa  levodopa , hypertension, diabetes.  Patient is acutely confused and very hard of hearing.  The following information was obtained from chart review and ER report: The patient was found at [name] today and police were called due to patient reportedly demonstrating bizarre behaviors.  Patient then got in car and attempted to drive and was stopped by 088.  While waiting in police car patient became short of breath which police report may have been secondary to heat.  EMS was called.  On arrival to the ED patient was agitated and confused.  He required multiple rounds of Versed .  He remains encephalopathic and requesting to discharge home.  ED physician placed IVC for patient. Labs are mostly unremarkable.  UA consistent with possible UTI.  Patient has been started on ceftriaxone . Reported rhythm on arrival is A-fib RVR.  Presently he is tremulous with significant artifact, but appears to be NSR in the 80s  When asking patient how he got here he has difficulty answering the question and continues to repeat that he would like to go home.  No further review of systems or medication history was able to be obtained.  I contacted his sister, Rick [name] and Rick [name] with no answer and no voicemail.  review of Systems: Unable to review all systems due to lack of cooperation from patient. Past Medical History:  Diagnosis Date   Anxiety    Bipolar disorder [facility])    Bladder cancer (HCC)    BPH (benign prostatic hypertrophy)    Chest pain, atypical 06/23/2015   Closed trimalleolar  fracture of left ankle 10/01/2015   Cystitis    hx of    Depression    Diabetes mellitus without complication (HCC)    type 2    Difficult or painful urination 10/31/2012   Dysrhythmia    Ear problem 03/24/2015   Gangrenous appendicitis    H/O urinary disorder 03/27/2013   Herpes genitalis in men    Hyperlipidemia    Hypertension    Insomnia    Kidney stones    Left hand pain 05/18/2015   Mass of arm 02/10/2015   Peripheral neuropathy    Skin cancer    UD (urethral discharge) 10/31/2012   Urinary tract infection    hx of    Past Surgical History:  Procedure Laterality Date   APPENDECTOMY     RUPTURED   BLADDER SURGERY     CHOLECYSTECTOMY     COLOSTOMY     AND LATER CLOSURE   CYSTOSCOPY/URETEROSCOPY/HOLMIUM LASER/STENT PLACEMENT Left 03/22/2018   Procedure: CYSTOSCOPY/LEFT URETEROSCOPY/LEFT RETROGRADE PYELOGRAM;  Surgeon: Rick Sherwood Rick Brydan, MD;  Location: [facility];  Service: Urology;  Laterality: Left;   EPIDIDYMECTOMY N/A 01/03/2019   Procedure: RIGHT EPIDIDYMAL CYST REMOVAL VERSES EPIDIDMECTOMY;  Surgeon: Rick Sherwood Rick Roniel, MD;  Location: [facility];  Service: Urology;  Laterality: N/A;   FRACTURE SURGERY     HERNIA REPAIR     INNER EAR SURGERY Left    ORIF ANKLE FRACTURE Left 09/27/2015   Procedure: OPEN REDUCTION INTERNAL FIXATION (ORIF) ANKLE FRACTURE;  Surgeon: Rick Rick  Poggi, MD;  Location: [facility] ORS;  Service: Orthopedics;  Laterality: Left;   SKIN CANCER EXCISION     TRANSURETHRAL RESECTION OF PROSTATE N/A 03/22/2018   Procedure: TRANSURETHRAL RESECTION OF THE PROSTATE (TURP);  Surgeon: Rick Sherwood Rick Anfernee, MD;  Location: [facility];  Service: Urology;  Laterality: N/A;   VENTRAL HERNIA REPAIR N/A 01/14/2016   Procedure: HERNIA REPAIR VENTRAL ADULT;  Surgeon: Rick Rick Sharps, MD;  Location: [facility] ORS;  Service: General;  Laterality: N/A;   Social History:  reports that he quit smoking about 18 years ago. His smoking use included cigarettes. He started smoking about 29 years ago. He has  a 22.5 pack-year smoking history. He quit smokeless tobacco use about 13 years ago.  His smokeless tobacco use included snuff and chew. He reports that he does not drink alcohol and does not use drugs.  Allergies  Allergen Reactions   Blood-Group Specific Substance    Haloperidol Other (See Comments)    Rick   Sulfa Antibiotics Other (See Comments)    Rick    Family History  Problem Relation Age of Onset   Transient ischemic attack Mother    Diabetes Father    Cancer Brother     Prior to Admission medications   Medication Sig Start Date End Date Taking? Authorizing Provider  carbidopa -levodopa  (SINEMET  IR) 25-100 MG tablet TAKE ONE TABLET BY MOUTH THREE TIMES A DAY 03/17/22  Yes Johnson, Rick P, DO  carvedilol  (COREG ) 6.25 MG tablet Take 1 tablet (6.25 mg total) by mouth 2 (two) times daily with a meal. 07/26/21  Yes Johnson, Rick P, DO  diclofenac  sodium (VOLTAREN ) 1 % GEL Apply 2 g topically 4 (four) times daily. Patient taking differently: Apply 2 g topically 4 (four) times daily as needed (lower back pain.). 08/20/17  Yes Rick Vernell Norris, PA-C  enalapril  (VASOTEC ) 10 MG tablet Take 1 tablet (10 mg total) by mouth daily. 07/26/21  Yes Johnson, Rick P, DO  gabapentin  (NEURONTIN ) 300 MG capsule Take 2 capsules (600 mg total) by mouth 2 (two) times daily. 07/26/21  Yes Johnson, Rick P, DO  glipiZIDE  (GLUCOTROL ) 10 MG tablet Take 10 mg by mouth daily. 01/01/24  Yes [provider]  hydrochlorothiazide  (HYDRODIURIL ) 12.5 MG tablet TAKE 1 TABLET BY MOUTH DAILY. TAKE AS NEEDED. 02/06/22  Yes Johnson, Rick P, DO  lovastatin  (MEVACOR ) 40 MG tablet Take 1 tablet (40 mg total) by mouth daily. 07/26/21  Yes Johnson, Rick P, DO  metFORMIN  (GLUCOPHAGE -XR) 500 MG 24 hr tablet TAKE 2 TABLETS BY MOUTH IN THE MORNING AND AT BEDTIME 07/26/21  Yes Johnson, Rick P, DO  traZODone  (DESYREL ) 100 MG tablet TAKE 1 TABLET BY MOUTH AT BEDTIME 03/15/22  Yes Johnson, Rick P, DO  VOLTAREN  1  % GEL APPLY FOUR GRAMS TO AFFECTED AREA FOUR TIMES A DAY (PAIN AREA) 08/02/21  Yes Johnson, Rick P, DO  albuterol  (VENTOLIN  HFA) 108 (90 Base) MCG/ACT inhaler Inhale 2 puffs into the lungs every 6 (six) hours as needed for wheezing or shortness of breath. 11/15/21   Vigg, Avanti, MD  BD PEN NEEDLE NANO U/F 32G X 4 MM MISC USE 1 TIME DAILY AS DIRECTED. 01/11/22   Johnson, Rick P, DO  benzonatate  (TESSALON ) 100 MG capsule Take 1 capsule (100 mg total) by mouth 2 (two) times daily as needed for cough. 11/15/21   Vigg, Avanti, MD  Blood Glucose Monitoring Suppl (ONE TOUCH ULTRA SYSTEM KIT) w/Device KIT 1 kit by Does not apply route once.  02/11/16   Daphane Rosella, NP  fexofenadine  (ALLEGRA  ALLERGY) 180 MG tablet Take 1 tablet (180 mg total) by mouth daily. 11/15/21   Vigg, Avanti, MD  fluticasone  (FLONASE ) 50 MCG/ACT nasal spray PLACE 2 SPRAYS INTO BOTH NOSTRILS 2 TIMES DAILY 11/09/21   Vicci Bouchard P, DO  Insulin  Glargine (BASAGLAR  KWIKPEN) 100 UNIT/ML Inject 15 Units into the skin daily. 11/11/21 02/09/22  Vicci Bouchard P, DO  Insulin  Pen Needle (ULTICARE PEN NEEDLES) 29G X MISC USE AS DIRECTED 08/05/19   Johnson, Rick P, DO  Lancets (ONETOUCH ULTRASOFT) lancets TEST BLOOD SUGAR THREE TIMES DAILY 04/22/19   Johnson, Rick P, DO  Menthol, Topical Analgesic, (BENGAY EX) Apply 1 application topically 3 (three) times daily as needed (lower back pain.).    [provider]  ONETOUCH ULTRA test strip TEST BLOOD SUGAR THREE TIMES DAILY. 11/02/21   Johnson, Rick P, DO  valACYclovir  (VALTREX ) 1000 MG tablet Take 1 tablet (1,000 mg total) by mouth daily as needed. 03/27/22   Vicci Bouchard SQUIBB, DO    Physical Exam: Vitals:   03/10/24 1339 03/10/24 1400 03/10/24 1500 03/10/24 1600  BP: 117/84 113/73 108/75 109/77  Pulse: (!) 112 (!) 114 100 (!) 103  Resp:      Temp:      TempSrc:      SpO2:  93% 97% 97%  Weight:      Height:       Constitutional:  Normal appearance. Non toxic-appearing.  HENT: Head  Normocephalic and atraumatic.  Mucous membranes are moist.  Eyes:  Extraocular intact. Conjunctivae normal. Pupils are equal, round, and reactive to light.  Cardiovascular: Rate and Rhythm: Normal rate and regular rhythm.  Pulmonary: Non labored, symmetric rise of chest wall.  Musculoskeletal:  Normal range of motion.  Skin: warm and dry. not jaundiced.  Neurological: alert, disoriented. Heard of hearing.  Does not answer questions consistently.  States  I am ready to go home notable times  Data Reviewed:     Latest Ref Rng & Units 03/10/2024    1:15 PM 07/26/2021    8:47 AM 02/01/2021    3:57 PM  CBC  WBC 4.0 - 10.5 K/uL 6.3  7.1  7.9   Hemoglobin 13.0 - 17.0 g/dL 87.0  85.1  86.1   Hematocrit 39.0 - 52.0 % 39.1  44.8  40.1   Platelets 150 - 400 K/uL 220  269  261       Latest Ref Rng & Units 03/10/2024    1:15 PM 07/26/2021    8:47 AM 02/01/2021    3:57 PM  BMP  Glucose 70 - 99 mg/dL 806  848  878   BUN 8 - 23 mg/dL 19  21  17    Creatinine 0.61 - 1.24 mg/dL 8.67  8.84  8.92   BUN/Creat Ratio 10 - 24  18  16    Sodium 135 - 145 mmol/L 139  137  138   Potassium 3.5 - 5.1 mmol/L 3.0  3.8  3.9   Chloride 98 - 111 mmol/L 105  97  103   CO2 22 - 32 mmol/L 19  23  18    Calcium 8.9 - 10.3 mg/dL 8.8  9.3  9.4    All images reviewed  Assessment and Plan: Acute encephalopathy - Etiology Rick.  Differential is broad. - TSH, B12, thiamine , RPR, HIV ordered and pending.  UA with large amount leukocytes.  Encephalopathy may be secondary to infection - Continue with ceftriaxone  for now,  follow urine cultures - UDS negative - Head CT without acute process - Ammonia within normal limits - As needed Ativan  for agitation.  Allergy to Haldol  Presumed UTI - Patient unable to respond if he is having symptoms.  UA is positive for leukocytes.  In lieu of other explanation for his encephalopathy we will proceed with ceftriaxone  for now  Hard of hearing - Unclear if patient wears hearing  aids at baseline.  Will attempt to receive from family if able - Delirium precautions  Medication induced parkinsonism - Follows with neurology outpatient - Resume home dose carbidopa  levodopa  - Tremors at baseline  Atrial fibrillation with rapid rate - Presently NSR but reportedly A-fib RVR on arrival.  I see no evidence of this diagnosis on chart review.  If accurate this would be a new diagnosis for this patient - Will obtain echocardiogram when patient is calm and able to tolerate this exam - Monitor on telemetry  Hypertension - Resume home meds  Diabetes - Sliding scale insulin  for now, titrate as tolerated.  Hypokalemia - Replace as needed  Elevated creatinine - Baseline creatinine appears to be 1.15 over 2 years ago.  No recent labs to review.  Creatinine 1.32 on arrival.  Unclear baseline.  Patient may have CKD - Repeat labs in AM.   Advance Care Planning:   Code Status: Prior full code for now until patient is more coherent or family can be reached   Consults: N/a  Family Communication: Attempted to reach both contacts in patient's chart.  Unsuccessful.  No voicemail available  Severity of Illness: The appropriate patient status for this patient is INPATIENT. Inpatient status is judged to be reasonable and necessary in order to provide the required intensity of service to ensure the patient's safety. The patient's presenting symptoms, physical exam findings, and initial radiographic and laboratory data in the context of their chronic comorbidities is felt to place them at high risk for further clinical deterioration. Furthermore, it is not anticipated that the patient will be medically stable for discharge from the hospital within 2 midnights of admission.   * I certify that at the point of admission it is my clinical judgment that the patient will require inpatient hospital care spanning beyond 2 midnights from the point of admission due to high intensity of service, high  risk for further deterioration and high frequency of surveillance required.*  Author: Consetta Cosner, DO 03/10/2024 5:21 PM  For on call review [email or web address].

## 2024-03-10 NOTE — ED Provider Notes (Signed)
 [facility] Provider Note    Event Date/Time   First MD Initiated Contact with Patient 03/10/24 1304     (approximate)   History   Confusion  EM caveat: History obtained by EMS.  Patient unable to give succinct medical history, exhibits confusional state.  Additionally attempted to call the patient's listed emergency contact, unable to reach  HPI  Rick [name] is a 83 y.o. male   report patient was in the police car when he started to feel short of breath.  Patient reports I feel like I am on a [place]  Patient unable to tell me where he was traveling to or what store he went to.  He does also report that he does not drive.  However EMS reports the patient went to a local store [employer], staff there observed him to be acting a bit erratically, evidently then he got in his car and the police were called because of concerns that he was not safe to drive.  The police spent 20 to 30 minutes or so with him and then called EMS after he had altered mental status  Patient is able to tell me his home address but reports the year to be 2023, he is not able to recall why he went to the store and evidently it is per EMS is not the holder of a driver license either.  Patient is also intermittently agitated, yelling, attempting to get up and leave but cannot voice a clear plan and appears to disoriented occasionally picking at things with evidence of confusion      Physical Exam   Triage Vital Signs: ED Triage Vitals  Encounter Vitals Group     BP 03/10/24 1311 110/76     Girls Systolic BP Percentile --      Girls Diastolic BP Percentile --      Boys Systolic BP Percentile --      Boys Diastolic BP Percentile --      Pulse Rate 03/10/24 1311 (!) 113     Resp 03/10/24 1311 20     Temp 03/10/24 1311 98.4 F (36.9 C)     Temp Source 03/10/24 1311 Oral     SpO2 03/10/24 1311 96 %     Weight 03/10/24 1309 154 lb 3.2 oz (69.9 kg)     Height 03/10/24 1309 5' 4  (1.626 m)     Head Circumference --      Peak Flow --      Pain Score --      Pain Loc --      Pain Education --      Exclude from Growth Chart --     Most recent vital signs: Vitals:   03/10/24 1400 03/10/24 1500  BP: 113/73 108/75  Pulse: (!) 114 100  Resp:    Temp:    SpO2: 93% 97%     General: Awake, intermittently calm than we, somewhat agitated, yelling occasionally at staff, attempting to get up.  He voices that he is leaving but cannot properly orient, does not recall exact events of what happened today.  He seems to have exhibiting some confusion.  His exact baseline is somewhat difficult to ascertain and I have made attempts to reach emergency contact without success CV:  Good peripheral perfusion.  Mild tachycardia with irregularity Resp:  Normal effort.  Clear bilateral normal oxygen saturation.  Patient intermittently yelling very loudly at staff without obvious distress or difficulty with regard to  respiratory status Abd:  No distention.  Soft nontender nondistended Other:  Was all extremities.  Mucous membranes appear quite dry.  His skin appears quite dry as well   ED Results / Procedures / Treatments   Labs (all labs ordered are listed, but only abnormal results are displayed) Labs Reviewed  BASIC METABOLIC PANEL WITH GFR - Abnormal; Notable for the following components:      Result Value   Potassium 3.0 (*)    CO2 19 (*)    Glucose, Bld 193 (*)    Creatinine, Ser 1.32 (*)    Calcium 8.8 (*)    GFR, Estimated 54 (*)    All other components within normal limits  CBC - Abnormal; Notable for the following components:   Hemoglobin 12.9 (*)    All other components within normal limits  URINALYSIS, ROUTINE W REFLEX MICROSCOPIC - Abnormal; Notable for the following components:   Color, Urine YELLOW (*)    APPearance HAZY (*)    Protein, ur 30 (*)    Leukocytes,Ua LARGE (*)    Bacteria, UA RARE (*)    All other components within normal limits  URINE CULTURE   VITAMIN B12  FOLATE  AMMONIA  ETHANOL  URINE DRUG SCREEN, QUALITATIVE ([facility] ONLY)  TROPONIN I (HIGH SENSITIVITY)  TROPONIN I (HIGH SENSITIVITY)     EKG  EKG inter by me is negative for obvious frank ischemia.  Tremor artifact is present.  Some irregularity, query potential atrial fibrillation.  Tremor makes it slightly difficult to discern if P waves but there is irregularity and suggestive of possible A-fib -no frank ischemia   RADIOLOGY CT head inter by me is negative for obvious gross pathology.  CT Head Wo Contrast Result Date: 03/10/2024 CLINICAL DATA:  Delirium. EXAM: CT HEAD WITHOUT CONTRAST TECHNIQUE: Contiguous axial images were obtained from the base of the skull through the vertex without intravenous contrast. RADIATION DOSE REDUCTION: This exam was performed according to the departmental dose-optimization program which includes automated exposure control, adjustment of the mA and/or kV according to patient size and/or use of iterative reconstruction technique. COMPARISON:  None Available. FINDINGS: Brain: Age-related atrophy and mild-to-moderate periventricular white matter disease. No evidence of hemorrhage, mass, acute cortical infarct or hydrocephalus. Vascular: Moderate vascular calcifications. Skull: Intact and unremarkable. Sinuses/Orbits: Clear paranasal sinuses.  Normal orbits. Other: None. IMPRESSION: 1. Age-related atrophy and mild to moderate cerebral white matter disease. No apparent acute process. Electronically Signed   By: Evalene Coho M.D.   On: 03/10/2024 15:19   DG Chest Port 1 View Result Date: 03/10/2024 CLINICAL DATA:  [id number] SOB (shortness of breath) [id number] EXAM: PORTABLE CHEST - 1 VIEW COMPARISON:  11/02/2020 FINDINGS: Right pleural effusion no longer evident. Improvement in bibasilar airspace opacities seen previously, minimal residual on the left. Low lung volumes. Heart size and mediastinal contours are within normal limits. Aortic Atherosclerosis  (ICD10-170.0). Visualized bones unremarkable. IMPRESSION: Improved bibasilar airspace disease and right pleural effusion. Electronically Signed   By: JONETTA Faes M.D.   On: 03/10/2024 13:59    PROCEDURES:  Critical Care performed: No  Procedures   MEDICATIONS ORDERED IN ED: Medications  cefTRIAXone  (ROCEPHIN ) 1 g in sodium chloride  0.9 % 100 mL IVPB (has no administration in time range)  thiamine  (VITAMIN B1) injection 100 mg (has no administration in time range)  midazolam  (VERSED ) injection 2 mg (has no administration in time range)  midazolam  (VERSED ) injection 2 mg (2 mg Intravenous Given 03/10/24 1341)  midazolam  (VERSED ) injection 2  mg (2 mg Intravenous Given 03/10/24 1416)  sodium chloride  0.9 % bolus 500 mL (500 mLs Intravenous New Bag/Given 03/10/24 1609)     IMPRESSION / MDM / ASSESSMENT AND PLAN / ED COURSE  I reviewed the triage vital signs and the nursing notes.                              Differential diagnosis includes, but is not limited to, acute encephalopathy, acute confusion, infection, intoxication substance abuse metabolic cause etc.  The patient does not demonstrate good orientation he does not demonstrate capacity.  He is yelling at staff occasionally redirectable sometimes making efforts to assist to get up from bed and will not redirect.  Ultimately he was able to be called with dosing of midazolam  and calms appropriately but not after too long will become once again yelling out.  At 1 point he is insistent that he be discharged but is not able to voice any sort of a clear plan he cannot fully understand why he was at the emergency department he does tell me he feels dry and dehydrated like he is on it does not hurt.  He does appear dry.  I am very suspicious of concomitant illness.  He does not demonstrate capacity to make clear conscientious safe medical decisions, and I have placed him under involuntary commitment.  Clinical history provided also suggest that he  was potentially confused while driving but information suggests by EMS that he does not have a driver's license etc. demonstrating pretty significant unsafe behavior suspicious.  Midazolam  given for altered mental status treatment of agitation during the course of his ER stay for therapeutic purposes related to alteration of mental status.     I think he definitely needs further assessment.  After workup and his labs he has mild hypokalemia mild acute renal sufficiency which we are hydrating.  Normal troponin.  Atrial fibrillation suspicious, attentionally new onset and evidence of urinary tract infection  Patient's presentation is most consistent with acute complicated illness / injury requiring diagnostic workup.  The patient is on the cardiac monitor to evaluate for evidence of arrhythmia and/or significant heart rate changes   Clinical Course as of 03/10/24 1609  Mon Mar 10, 2024  1523 Patient resting more comfortably.  Midazolam  has helped to calm him.  Currently awaiting admission appears to have evidence of urinary tract infection I suspect this is likely contributing to a picture of delirium confusion and encephalopathy.  Initiating Rocephin .  CT head grossly interpreted by me as negative for acute hemorrhage awaiting final read by radiology [MQ]  1524 Called Cathryne Garret (emergency contact / family) via listed number. Phone rings, no answer, and no voicemail available (doesn't go to voicemail) just rings multiple times [MQ]    Clinical Course User Index [MQ] Dicky Anes, MD   ----------------------------------------- 4:08 PM on 03/10/2024 ----------------------------------------- Admitted to hospitalist service under the care of Dr. Leesa.  Further testing including ethanol level, ammonia pending.  No tachypnea, not febrile, normal white count.  Urinary tract infection without sepsis criteria met at this time.  Initiating Rocephin   FINAL CLINICAL IMPRESSION(S) / ED DIAGNOSES    Final diagnoses:  Involuntary commitment  Urinary tract infection, acute  Delirium     Rx / DC Orders   ED Discharge Orders     None        Note:  This document was prepared using Dragon voice recognition software and  may include unintentional dictation errors.   Dicky Anes, MD 03/10/24 [phone]

## 2024-03-10 NOTE — ED Notes (Signed)
 Pt oriented to person, place, and situation. States year as 2023. Pleasant mood. Able to converse with staff.

## 2024-03-10 NOTE — ED Notes (Signed)
 Called pts sister Jyl [phone]) and updated on plan of care.

## 2024-03-10 NOTE — ED Triage Notes (Signed)
 Pt to ED [facility] for SOB that started while he was in the back of a police car with no AC. EMS found pt to be in a fib RVR, 110-140 PD was called because pt not supposed to be driving because of medical issues but was seen driving 81# L forearm, 200mL fluid given so far  EMS VS: 96/65 then 95/52, CBG 161, 96% RA, HR 110-140 a fib  Pt appears mildly altered, keeps demanding water  Respirations are unlabored. Skin dry. Pt is poor historian.

## 2024-03-10 NOTE — ED Notes (Signed)
 Pt incontinent of urine. Pt cleaned by this tech, Danajai, EDT, and pt sitter tech, [place]. Pt provided with new linen, brief and chux in place. Pt wiped down with warm washcloth. Soiled clothes placed in pt belonging bags and pt was placed in hospital gown. Warm blankets given and call light within reach. Sitter remains at bedside with pt. No further needs at this time.

## 2024-03-10 NOTE — ED Notes (Addendum)
 Sister [facility]) updated via telephone.

## 2024-03-11 ENCOUNTER — Inpatient Hospital Stay (HOSPITAL_COMMUNITY)
Admit: 2024-03-11 | Discharge: 2024-03-11 | Disposition: A | Discharge status: Home / Self Care | Attending: Family Medicine | Admitting: Family Medicine

## 2024-03-11 DIAGNOSIS — G20C Parkinsonism, unspecified: Secondary | ICD-10-CM | POA: Diagnosis not present

## 2024-03-11 DIAGNOSIS — F05 Delirium due to known physiological condition: Secondary | ICD-10-CM | POA: Diagnosis not present

## 2024-03-11 DIAGNOSIS — I4891 Unspecified atrial fibrillation: Secondary | ICD-10-CM

## 2024-03-11 DIAGNOSIS — N390 Urinary tract infection, site not specified: Secondary | ICD-10-CM | POA: Diagnosis not present

## 2024-03-11 DIAGNOSIS — G9340 Encephalopathy, unspecified: Secondary | ICD-10-CM | POA: Diagnosis not present

## 2024-03-11 LAB — ECHOCARDIOGRAM COMPLETE
AR max vel: 2.09 cm2
AV Area VTI: 2.15 cm2
AV Area mean vel: 2.04 cm2
AV Mean grad: 2.5 mmHg
AV Peak grad: 3.9 mmHg
Ao pk vel: 0.98 m/s
Area-P 1/2: 4.89 cm2
Height: 64 in
S' Lateral: 3 cm
Weight: 2467.2 [oz_av]

## 2024-03-11 LAB — GLUCOSE, CAPILLARY
Glucose-Capillary: 144 mg/dL — ABNORMAL HIGH (ref 70–99)
Glucose-Capillary: 199 mg/dL — ABNORMAL HIGH (ref 70–99)
Glucose-Capillary: 134 mg/dL — ABNORMAL HIGH (ref 70–99)
Glucose-Capillary: 180 mg/dL — ABNORMAL HIGH (ref 70–99)

## 2024-03-11 LAB — HEMOGLOBIN A1C
Hgb A1c MFr Bld: 7 % — ABNORMAL HIGH (ref 4.8–5.6)
Mean Plasma Glucose: 154.2 mg/dL

## 2024-03-11 MED ORDER — QUETIAPINE FUMARATE 25 MG PO TABS
25.0000 mg | ORAL_TABLET | Freq: Every day | ORAL | Status: DC
  Administered 2024-03-11 – 2024-03-18 (×8): 25 mg via ORAL
  Filled 2024-03-11 (×8): qty 1

## 2024-03-11 MED ORDER — QUETIAPINE FUMARATE 25 MG PO TABS
12.5000 mg | ORAL_TABLET | Freq: Three times a day (TID) | ORAL | Status: DC | PRN
  Administered 2024-03-17: 12.5 mg via ORAL
  Filled 2024-03-11: qty 1

## 2024-03-11 MED ORDER — VITAMIN B-12 1000 MCG PO TABS
500.0000 ug | ORAL_TABLET | Freq: Every day | ORAL | Status: DC
  Administered 2024-03-11 – 2024-03-14 (×4): 500 ug via ORAL
  Filled 2024-03-11 (×4): qty 1

## 2024-03-11 NOTE — ED Notes (Signed)
 Patient / remains under IVC/ admitted medically

## 2024-03-11 NOTE — Consult Note (Signed)
 [facility] Psychiatric Consult Initial  Patient Name: .Rick [name]  MRN: [id number]  DOB: September 04, 1940  Consult Order details:  Orders (From admission, onward)     Start     Ordered   03/10/24 1414  IP CONSULT TO PSYCHIATRY       Ordering Provider: Dicky Anes, MD  Provider:  (Not yet assigned)  Question Answer Comment  Place call to: psych md   Reason for Consult Consult   Diagnosis/Clinical Info for Consult: mental capacity, altered sensorium, IVC      03/10/24 1413             Mode of Visit: In person    Psychiatry Consult Evaluation  Service Date: March 11, 2024 LOS:  LOS: 1 day  Chief Complaint: consult for capacity, IVC status   Primary Psychiatric Diagnoses  Delirium due to multiple etiologies-UTI parkinsonism on Sinemet    Assessment  Rick [name] is a 83 y.o. male admitted: Medically      The patient is an 83 y.o. male with medical history significant of  parkinsonism on carbidopa  levodopa , hypertension, diabetes. Patient is acutely confused and very hard of hearing.  The following information was obtained from chart review and ER report: The patient was found at [facility] today and police were called due to patient reportedly demonstrating bizarre behaviors.  Patient then got in car and attempted to drive and was stopped by 088.  While waiting in police car patient became short of breath which police report may have been secondary to heat.  EMS was called.  On arrival to the ED patient was agitated and confused.  He required multiple rounds of Versed .  He remains encephalopathic and requesting to discharge home.  ED physician placed IVC for patient.Labs are mostly unremarkable.  UA consistent with possible UTI.  Patient has been started on ceftriaxone . Reported rhythm on arrival is A-fib RVR.  Presently he is tremulous with significant artifact, but appears to be NSR in the 80s. When asking patient how he got here he has difficulty answering the question and  continues to repeat that he would like to go home.  No further review of systems or medication history was able to be obtained.       Psychiatry consulted for assessment of capacity and IVC status.  Patient was not cooperative and did not allow us  to complete the assessment and was easily getting frustrated due to hard of hearing.  Will reach out to family members for a meaningful assessment and a comprehensive treatment plan.  Diagnoses:  Active Hospital problems: Principal Problem:   Encephalopathy    Plan   ## Psychiatric Medication Recommendations:  Patient continues to exhibit s/sx of delirium. Potential causes include infection (UTI), B12 deficiency, medication induced (Sinemet  appears to have been increased about 6 months ago). IVC will be maintained at this time, and we will reach out to family to better understand patient's baseline.  -Continue Ativan  prn for agitation; allergy to Haldol  -Continue Sinemet  as prescribed -Strict delirium precautions  -Will consider adding an anti-psychotic to stabilize mood (Seroquel -25 mg nightly and as needed 12.5 mg every 8 hours as needed agitation will also consider Depakote  -UTI treatment with Ceftriaxone  -B12 deficiency as potential cause for acute delirium? Decreased albumin, decreased total protein, and patient's appearance consistent with deficiency due to insufficient intake. Consider replacement with IM injection or PO therapy -Will reach out to family to better establish patient's baseline and acuity of symptoms.  ## Medical Decision  Making Capacity: Does not have full capacity at this time as he was very irritable and refused to answer the questions related to his understanding of medical problems and the reason for his current hospitalization will reach out to family  ## Further Work-up:   -- most recent EKG on 03/10/24 had QtC of 445 ms -- Pertinent labwork reviewed earlier this admission includes: hyperglycemia with A1C of 7%,  decreased albumin of 2.6, total protein 5.6, vitamin B12 deficiency of 137, Hb 12.3, Hct 37.4   ## Disposition:-- Plan Post Discharge/Psychiatric Care Follow-up resources will continue to follow-up until we understand his baseline.  No psychiatric indication for inpatient hospitalization at this time.  Will manage the behaviors on the medical floor  ## Behavioral / Environmental: -Delirium Precautions: Delirium Interventions for Nursing and Staff: - RN to open blinds every AM. - To Bedside: Glasses, hearing aide, and pt's own shoes. Make available to patients. when possible and encourage use. - Encourage po fluids when appropriate, keep fluids within reach. - OOB to chair with meals. - Passive ROM exercises to all extremities with AM & PM care. - RN to assess orientation to person, time and place QAM and PRN. - Recommend extended visitation hours with familiar family/friends as feasible. - Staff to minimize disturbances at night. Turn off television when pt asleep or when not in use., To minimize splitting of staff, assign one staff person to communicate all information from the team when feasible., or Utilize compassion and acknowledge the patient's experiences while setting clear and realistic expectations for care.    ## Safety and Observation Level:  - Based on my clinical evaluation, I estimate the patient to be at low risk of self harm in the current setting. - At this time, we recommend  routine. This decision is based on my review of the chart including patient's history and current presentation, interview of the patient, mental status examination, and consideration of suicide risk including evaluating suicidal ideation, plan, intent, suicidal or self-harm behaviors, risk factors, and protective factors. This judgment is based on our ability to directly address suicide risk, implement suicide prevention strategies, and develop a safety plan while the patient is in the clinical setting. Please  contact our team if there is a concern that risk level has changed.  CSSR Risk Category:C-SSRS RISK CATEGORY: No Risk  Suicide Risk Assessment: Patient has following modifiable risk factors for suicide: social isolation, which we are addressing by monitoring behaviors on unit and collaborating with family members. Patient has following non-modifiable or demographic risk factors for suicide: male gender Patient has the following protective factors against suicide: Supportive family  Thank you for this consult request. Recommendations have been communicated to the primary team.  We will continue to monitor at this time.   Recardo Poche, Student-PA       History of Present Illness  Relevant Aspects of Hospital   Patient was placed on IVC due to requests to discharge home despite medical instability and AMS. ED workup significant for possible UTI being treated with Ceftriaxone , B12 deficiency, decreased total protein and albumin, negative UDS. Head CT with age-related atrophy and no acute changes.  Patient Report:  Mr. Stonehocker was seen this afternoon. On arrival, the patient is laying in bed and eating with the help of a tech. Patient appears frail and a bit unkempt. He is very hard of hearing, which does make the interview difficult. He is alert, but continues to lack full orientation. He can state his name,  but cannot answer regarding the date or year. When asked where he is, the patient replies I'm in the [facility]. He is eventually able to tell me his address, and says he lives alone in [place]. When asked about his understanding regarding his admission, he explains that he was in front of [employer] and it got too hot. The patient then began to exhibit frustration regarding questions. He denied taking medication for depression or anxiety and then began to raise his voice and exhibit agitation, so the interview was concluded.    Psych ROS:  Depression: Denies Anxiety:  Denies Mania (lifetime  and current): Unable to ask Psychosis: (lifetime and current): Unable to ask  Collateral information:  Will reach out to his sister listed in the chart    Psychiatric and Social History  Psychiatric History:  Information collected from patient, chart review.  Prev Dx/Sx: Medication-induced parkinsonism on sinemet  IR, HTN, DM2, BPH. Does have diagnosis of anxiety, bipolar disorder, and depression in chart, but unable to corroborate with patient. Current Psych Provider: None noted on chart review Home Meds (current): Carbidopa -levodopa  25-100 mg, Carvedilol , gabapentin  600 mg PO bid, Metformin , Trazodone  100 mg, others as seen on med list Previous Med Trials: Chart review shows hx of Remeron , Lamictal. Unable to ask Therapy: Unable to ask  Prior Psych Hospitalization: Unable to ask  Prior Self Harm: Unable to ask Prior Violence: Unable to ask  Family Psych History: Unable to ask, but none seen on chart review Family Hx suicide: Unable to ask, but none seen on chart review  Social History:   Educational Hx: Unable to ask Occupational Hx: Unable to ask Legal Hx: Unable to ask Living Situation: Lives in [place] by himself Spiritual Hx: Unable to ask Access to weapons/lethal means: Unable to ask   Substance History Alcohol: Denies use  History of alcohol withdrawal seizures: n/a History of DT's: n/a Tobacco: Former smoker, quit in 2018 Illicit drugs: Denies Prescription drug abuse: Denies  Exam Findings  Physical Exam: Patient laying comfortably, NAD and eating soft diet. Appears frail, right leg tremor seen. Breathing is non-labored, and patient able to talk. Is alert but remains disoriented. Hard of hearing and does not consistently answer questions. Attention, concentration, and orientation are all impaired at this time. Behavior is agitated and withdrawn and thoughts are overall disorganized.   Vital Signs:  Temp:  [97.9 F (36.6 C)-98.5 F (36.9 C)] 98.5 F (36.9 C)  (07/29 1152) Pulse Rate:  [82-114] 98 (07/29 1300) Resp:  [14-24] 20 (07/29 1152) BP: (85-116)/(53-93) 112/53 (07/29 1300) SpO2:  [92 %-99 %] 96 % (07/29 1300) Blood pressure (!) 112/53, pulse 98, temperature 98.5 F (36.9 C), resp. rate 20, height 5' 4 (1.626 m), weight 69.9 kg, SpO2 96%. Body mass index is 26.47 kg/m.    Mental Status Exam: General Appearance: Disheveled, alert and in no acute distress  Orientation:  Other:  self and home address  Memory:  Immediate;   Poor  Concentration:  Concentration: Poor, possibly compounded by difficulty hearing  Recall:  Poor  Attention  Poor  Eye Contact:  Minimal  Speech:  Pressured  Language:  Good  Volume:  Increased  Mood: Agitated  Affect:  Blunt  Thought Process:  Disorganized  Thought Content:  Illogical at times  Suicidal Thoughts:  Unable to ask  Homicidal Thoughts:  Unable to ask  Judgement:  Impaired  Insight:  Lacking  Psychomotor Activity:  Tremor  Akathisia:  No  Fund of Knowledge:  Poor  Assets:  Housing Social Support  Cognition:  Impaired,  Moderate  ADL's:  Impaired  AIMS (if indicated):       Other History   These have been pulled in through the EMR, reviewed, and updated if appropriate.  Family History:  The patient's family history includes Cancer in his brother; Diabetes in his father; Transient ischemic attack in his mother.  Medical History: Past Medical History:  Diagnosis Date   Anxiety    Bipolar disorder (HCC)    Bladder cancer (HCC)    BPH (benign prostatic hypertrophy)    Chest pain, atypical 06/23/2015   Closed trimalleolar fracture of left ankle 10/01/2015   Cystitis    hx of    Depression    Diabetes mellitus without complication (HCC)    type 2    Difficult or painful urination 10/31/2012   Dysrhythmia    Ear problem 03/24/2015   Gangrenous appendicitis    H/O urinary disorder 03/27/2013   Herpes genitalis in men    Hyperlipidemia    Hypertension    Insomnia    Kidney  stones    Left hand pain 05/18/2015   Mass of arm 02/10/2015   Peripheral neuropathy    Skin cancer    UD (urethral discharge) 10/31/2012   Urinary tract infection    hx of     Surgical History: Past Surgical History:  Procedure Laterality Date   APPENDECTOMY     RUPTURED   BLADDER SURGERY     CHOLECYSTECTOMY     COLOSTOMY     AND LATER CLOSURE   CYSTOSCOPY/URETEROSCOPY/HOLMIUM LASER/STENT PLACEMENT Left 03/22/2018   Procedure: CYSTOSCOPY/LEFT URETEROSCOPY/LEFT RETROGRADE PYELOGRAM;  Surgeon: Carolee Sherwood JONETTA Bethany, MD;  Location: [facility];  Service: Urology;  Laterality: Left;   EPIDIDYMECTOMY N/A 01/03/2019   Procedure: RIGHT EPIDIDYMAL CYST REMOVAL VERSES EPIDIDMECTOMY;  Surgeon: Carolee Sherwood JONETTA Gorden, MD;  Location: [facility];  Service: Urology;  Laterality: N/A;   FRACTURE SURGERY     HERNIA REPAIR     INNER EAR SURGERY Left    ORIF ANKLE FRACTURE Left 09/27/2015   Procedure: OPEN REDUCTION INTERNAL FIXATION (ORIF) ANKLE FRACTURE;  Surgeon: Norleen JINNY Maltos, MD;  Location: [facility];  Service: Orthopedics;  Laterality: Left;   SKIN CANCER EXCISION     TRANSURETHRAL RESECTION OF PROSTATE N/A 03/22/2018   Procedure: TRANSURETHRAL RESECTION OF THE PROSTATE (TURP);  Surgeon: Carolee Sherwood JONETTA Arun, MD;  Location: [facility];  Service: Urology;  Laterality: N/A;   VENTRAL HERNIA REPAIR N/A 01/14/2016   Procedure: HERNIA REPAIR VENTRAL ADULT;  Surgeon: Larinda Unknown Sharps, MD;  Location: [facility];  Service: General;  Laterality: N/A;     Medications:   Current Facility-Administered Medications:    acetaminophen  (TYLENOL ) tablet 650 mg, 650 mg, Oral, Q6H PRN, Dezii, Alexandra, DO   carbidopa -levodopa  (SINEMET  IR) 25-100 MG per tablet immediate release 1 tablet, 1 tablet, Oral, TID, Dezii, Alexandra, DO, 1 tablet at 03/11/24 0915   carvedilol  (COREG ) tablet 6.25 mg, 6.25 mg, Oral, BID WC, Dezii, Alexandra, DO, 6.25 mg at 03/11/24 9161   cefTRIAXone  (ROCEPHIN ) 1 g in sodium chloride  0.9 % 100 mL IVPB, 1 g,  Intravenous, Q24H, Dezii, Alexandra, DO   heparin  injection 5,000 Units, 5,000 Units, Subcutaneous, Q8H, Dezii, Alexandra, DO, 5,000 Units at 03/11/24 9365   HYDROcodone -acetaminophen  (NORCO/VICODIN) 5-325 MG per tablet 1 tablet, 1 tablet, Oral, Q6H PRN, Dezii, Alexandra, DO   insulin  aspart (novoLOG ) injection 0-9 Units, 0-9 Units, Subcutaneous, TID WC, Dezii, Alexandra, DO,  2 Units at 03/11/24 1245   LORazepam  (ATIVAN ) injection 2 mg, 2 mg, Intravenous, Q4H PRN, Dezii, Alexandra, DO   morphine  (PF) 2 MG/ML injection 2 mg, 2 mg, Intravenous, Q4H PRN, Dezii, Alexandra, DO  Allergies: Allergies  Allergen Reactions   Blood-Group Specific Substance    Haloperidol Other (See Comments)    Unknown   Sulfa Antibiotics Other (See Comments)    Unknown    Recardo Poche, Student-PA

## 2024-03-11 NOTE — Progress Notes (Signed)
 PROGRESS NOTE    Rick [name]  FMW:[id number] DOB: July 29, 1941 DOA: 03/10/2024 PCP: Sampson Ethridge LABOR, MD  Chief Complaint  Patient presents with   Shortness of Breath    Hospital Course:  Rick [name] is an 83 year old male with medication induced parkinsonism on carbidopa  levodopa , hypertension, diabetes, hearing loss who presents via EMS and police for altered mental status.  Patient was found at the [facility] acting bizarre and attempted to get in a car.  Bystanders called 911.  While waiting in the police car patient became short of breath which please report may have been secondary to the heat.  EMS was called.  On arrival to the ED patient was agitated and confused requiring multiple rounds of Versed .  Initially was encephalopathic.  Labs were unremarkable.  UA consistent with possible UTI.  He was started on ceftriaxone .  Reported rhythm on arrival was A-fib RVR by the time of my evaluation he was in normal sinus rhythm. On repeat evaluation 7/29 patient is much improved.  He is now oriented x 4.  He is very hard of hearing and thus it is difficult to ascertain medical history and secure safe discharge.  Patient has requested we call his family members for further discussion.  Subjective: Today patient is oriented x 4.  He is requesting to discharge home.  Difficult to have conversation with him given hard of hearing, he cannot further expand on where he lives and who he lives with.  I was able to reach his sister Deanna who unfortunately does not live with him and has not seen him in many months.  She recommended I call his brother, Darina, but after multiple attempts I was unable to get Darina on the phone.   Objective: Vitals:   03/11/24 0747 03/11/24 1152 03/11/24 1300 03/11/24 1559  BP: 109/68 (!) 85/54 (!) 112/53 110/83  Pulse: 82 91 98 92  Resp: 20 20  20   Temp: 98.5 F (36.9 C) 98.5 F (36.9 C)  98.2 F (36.8 C)  TempSrc:      SpO2: 98% 92% 96% 93%  Weight:       Height:        Intake/Output Summary (Last 24 hours) at 03/11/2024 1642 Last data filed at 03/11/2024 1625 Gross per 24 hour  Intake 472 ml  Output 400 ml  Net 72 ml   Filed Weights   03/10/24 1309  Weight: 69.9 kg    Examination: General exam: Appears calm and comfortable, NAD  Respiratory system: No work of breathing, symmetric chest wall expansion Cardiovascular system: S1 & S2 heard, RRR.  Gastrointestinal system: Abdomen is nondistended, soft and nontender.  Neuro: Alert and oriented.  Very hard of hearing.. Extremities: Symmetric, expected ROM Skin: No rashes, lesions Psychiatry: Demonstrates appropriate judgement and insight. Mood & affect appropriate for situation.   Assessment & Plan:  Principal Problem:   Encephalopathy   Acute encephalopathy - Perhaps secondary to UTI - Workup has been mostly unrevealing - TSH, RPR, HIV within normal limits - B12 is low. - Encephalopathy may be secondary to UTI.  On treatment as below.   - UDS negative - Head CT without acute process - Ammonia within normal limits - As needed Ativan  for agitation.  Allergy to Haldol - PT/OT evals ordered and pending.  May need SNF   Presumed UTI - Patient unable to respond if he is having symptoms.  UA is positive for leukocytes.  In lieu of other explanation for his encephalopathy  we will proceed with ceftriaxone  for now - Follow urine culture   Hard of hearing - His sister was unable to expand upon this particular problem.  She does not believe he has hearing aids - Delirium precautions ordered - Continue to try and reach family for collateral information   Medication induced parkinsonism - Follows with neurology outpatient - Resume home dose carbidopa  levodopa  - Tremors at baseline   Atrial fibrillation with rapid rate - Appears to be paroxysmal on telemetry. - Rate controlled without meds. - Echocardiogram within normal limits today - Continue to monitor on telemetry -  Given his encephalopathy and unsteadiness I would not recommend initiation of anticoagulation at this time.  He is high fall risk. - Outpatient cardiology follow-up   hypertension - Resume home meds   Diabetes - Sliding scale insulin  for now, titrate as tolerated.   Hypokalemia - Replace as needed   AKI - Creatinine on arrival 1.32, has downtrended to 0.98 now - Unclear baseline - Possibility of CKD though at this time creatinine appears stable. - Continue to trend CMP - Renally dose with creatinine clearance of 48 when needed  B12 deficiency - Initiate supplementation  DVT prophylaxis: Heparin    Code Status: Full Code Disposition: Inpatient, currently pending physical therapy recommendations.  Have been unable to reach family today for collateral to establish safe discharge  Consultants:  Treatment Team:  Consulting Physician: Donnelly Mellow, MD  Procedures:    Antimicrobials:  Anti-infectives (From admission, onward)    Start     Dose/Rate Route Frequency Ordered Stop   03/11/24 1600  cefTRIAXone  (ROCEPHIN ) 1 g in sodium chloride  0.9 % 100 mL IVPB        1 g 200 mL/hr over 30 Minutes Intravenous Every 24 hours 03/10/24 1739 03/15/24 1559   03/10/24 1430  cefTRIAXone  (ROCEPHIN ) 1 g in sodium chloride  0.9 % 100 mL IVPB        1 g 200 mL/hr over 30 Minutes Intravenous STAT 03/10/24 1429 03/10/24 1642       Data Reviewed: I have personally reviewed following labs and imaging studies CBC: Recent Labs  Lab 03/10/24 1315 03/10/24 2303  WBC 6.3 6.6  NEUTROABS  --  4.1  HGB 12.9* 12.3*  HCT 39.1 37.4*  MCV 85.7 86.4  PLT 220 198   Basic Metabolic Panel: Recent Labs  Lab 03/10/24 1315 03/10/24 2303  NA 139 140  K 3.0* 4.1  CL 105 109  CO2 19* 23  GLUCOSE 193* 160*  BUN 19 23  CREATININE 1.32* 0.98  CALCIUM 8.8* 8.7*  MG  --  1.8  PHOS  --  3.1   GFR: Estimated Creatinine Clearance: 48.7 mL/min (by C-G formula based on SCr of 0.98 mg/dL). Liver  Function Tests: Recent Labs  Lab 03/10/24 2303  AST 16  ALT 8  ALKPHOS 51  BILITOT 0.6  PROT 5.6*  ALBUMIN 2.6*   CBG: Recent Labs  Lab 03/11/24 0749 03/11/24 1149 03/11/24 1600  GLUCAP 144* 199* 134*    No results found for this or any previous visit (from the past 240 hours).   Radiology Studies: ECHOCARDIOGRAM COMPLETE Result Date: 03/11/2024    ECHOCARDIOGRAM REPORT   Patient Name:   YUE GLASHEEN Date of Exam: 03/11/2024 Medical Rec #:  [id number]       Height:       64.0 in Accession #:    [id number]      Weight:       154.2 lb  Date of Birth:  Dec 28, 1940      BSA:          1.752 m Patient Age:    82 years        BP:           112/53 mmHg Patient Gender: M               HR:           98 bpm. Exam Location:  [facility] Procedure: 2D Echo, Cardiac Doppler and Color Doppler (Both Spectral and Color            Flow Doppler were utilized during procedure). Indications:     Atrial Fibrillation I48.91  History:         Patient has prior history of Echocardiogram examinations, most                  recent 09/28/2015. Risk Factors:Hypertension and Dyslipidemia.  Sonographer:     Christopher Furnace Referring Phys:  [id number] LORANE POLAND Diagnosing Phys: Deatrice Cage MD  Sonographer Comments: Image acquisition challenging due to patient body habitus. IMPRESSIONS  1. Left ventricular ejection fraction, by estimation, is 55 to 60%. The left ventricle has normal function. The left ventricle has no regional wall motion abnormalities. There is mild left ventricular hypertrophy. Left ventricular diastolic parameters are indeterminate.  2. Right ventricular systolic function is normal. The right ventricular size is normal. There is mildly elevated pulmonary artery systolic pressure.  3. Left atrial size was moderately dilated.  4. The mitral valve is normal in structure. No evidence of mitral valve regurgitation. No evidence of mitral stenosis.  5. The aortic valve is normal in structure. Aortic valve regurgitation  is not visualized. Aortic valve sclerosis/calcification is present, without any evidence of aortic stenosis. FINDINGS  Left Ventricle: Left ventricular ejection fraction, by estimation, is 55 to 60%. The left ventricle has normal function. The left ventricle has no regional wall motion abnormalities. The left ventricular internal cavity size was normal in size. There is  mild left ventricular hypertrophy. Left ventricular diastolic parameters are indeterminate. Right Ventricle: The right ventricular size is normal. No increase in right ventricular wall thickness. Right ventricular systolic function is normal. There is mildly elevated pulmonary artery systolic pressure. The tricuspid regurgitant velocity is 2.90  m/s, and with an assumed right atrial pressure of 5 mmHg, the estimated right ventricular systolic pressure is 38.6 mmHg. Left Atrium: Left atrial size was moderately dilated. Right Atrium: Right atrial size was normal in size. Pericardium: There is no evidence of pericardial effusion. Mitral Valve: The mitral valve is normal in structure. There is moderate thickening of the mitral valve leaflet(s). No evidence of mitral valve regurgitation. No evidence of mitral valve stenosis. Tricuspid Valve: The tricuspid valve is normal in structure. Tricuspid valve regurgitation is mild . No evidence of tricuspid stenosis. Aortic Valve: The aortic valve is normal in structure. Aortic valve regurgitation is not visualized. Aortic valve sclerosis/calcification is present, without any evidence of aortic stenosis. Aortic valve mean gradient measures 2.5 mmHg. Aortic valve peak  gradient measures 3.9 mmHg. Aortic valve area, by VTI measures 2.15 cm. Pulmonic Valve: The pulmonic valve was normal in structure. Pulmonic valve regurgitation is not visualized. No evidence of pulmonic stenosis. Aorta: The aortic root is normal in size and structure. Venous: The inferior vena cava was not well visualized. IAS/Shunts: No atrial  level shunt detected by color flow Doppler.  LEFT VENTRICLE PLAX 2D LVIDd:  4.60 cm LVIDs:         3.00 cm LV PW:         1.20 cm LV IVS:        1.10 cm LVOT diam:     2.00 cm LV SV:         35 LV SV Index:   20 LVOT Area:     3.14 cm  RIGHT VENTRICLE RV Basal diam:  2.80 cm RV Mid diam:    2.60 cm LEFT ATRIUM              Index        RIGHT ATRIUM           Index LA diam:        4.80 cm  2.74 cm/m   RA Area:     10.60 cm LA Vol (A2C):   94.5 ml  53.95 ml/m  RA Volume:   20.70 ml  11.82 ml/m LA Vol (A4C):   130.0 ml 74.22 ml/m LA Biplane Vol: 111.0 ml 63.37 ml/m  AORTIC VALVE AV Area (Vmax):    2.09 cm AV Area (Vmean):   2.04 cm AV Area (VTI):     2.15 cm AV Vmax:           98.30 cm/s AV Vmean:          69.700 cm/s AV VTI:            0.161 m AV Peak Grad:      3.9 mmHg AV Mean Grad:      2.5 mmHg LVOT Vmax:         65.50 cm/s LVOT Vmean:        45.300 cm/s LVOT VTI:          0.110 m LVOT/AV VTI ratio: 0.68  AORTA Ao Root diam: 3.30 cm MITRAL VALVE                TRICUSPID VALVE MV Area (PHT): 4.89 cm     TR Peak grad:   33.6 mmHg MV Decel Time: 155 msec     TR Vmax:        290.00 cm/s MV E velocity: 105.00 cm/s                             SHUNTS                             Systemic VTI:  0.11 m                             Systemic Diam: 2.00 cm Deatrice Cage MD Electronically signed by Deatrice Cage MD Signature Date/Time: 03/11/2024/3:21:32 PM    Final    CT Head Wo Contrast Result Date: 03/10/2024 CLINICAL DATA:  Delirium. EXAM: CT HEAD WITHOUT CONTRAST TECHNIQUE: Contiguous axial images were obtained from the base of the skull through the vertex without intravenous contrast. RADIATION DOSE REDUCTION: This exam was performed according to the departmental dose-optimization program which includes automated exposure control, adjustment of the mA and/or kV according to patient size and/or use of iterative reconstruction technique. COMPARISON:  None Available. FINDINGS: Brain: Age-related atrophy  and mild-to-moderate periventricular white matter disease. No evidence of hemorrhage, mass, acute cortical infarct or hydrocephalus. Vascular: Moderate vascular calcifications. Skull: Intact and unremarkable. Sinuses/Orbits: Clear paranasal sinuses.  Normal orbits.  Other: None. IMPRESSION: 1. Age-related atrophy and mild to moderate cerebral white matter disease. No apparent acute process. Electronically Signed   By: Evalene Coho M.D.   On: 03/10/2024 15:19   DG Chest Port 1 View Result Date: 03/10/2024 CLINICAL DATA:  [id number] SOB (shortness of breath) [id number] EXAM: PORTABLE CHEST - 1 VIEW COMPARISON:  11/02/2020 FINDINGS: Right pleural effusion no longer evident. Improvement in bibasilar airspace opacities seen previously, minimal residual on the left. Low lung volumes. Heart size and mediastinal contours are within normal limits. Aortic Atherosclerosis (ICD10-170.0). Visualized bones unremarkable. IMPRESSION: Improved bibasilar airspace disease and right pleural effusion. Electronically Signed   By: JONETTA Faes M.D.   On: 03/10/2024 13:59    Scheduled Meds:  carbidopa -levodopa   1 tablet Oral TID   carvedilol   6.25 mg Oral BID WC   heparin   5,000 Units Subcutaneous Q8H   insulin  aspart  0-9 Units Subcutaneous TID WC   Continuous Infusions:  cefTRIAXone  (ROCEPHIN )  IV 1 g (03/11/24 1525)     LOS: 1 day  MDM: Patient is high risk for one or more organ failure.  They necessitate ongoing hospitalization for continued IV therapies and subsequent lab monitoring. Total time spent interpreting labs and vitals, reviewing the medical record, coordinating care amongst consultants and care team members, directly assessing and discussing care with the patient and/or family: 55 min  Dianah Pruett, DO [facility]  To contact the attending physician between 7A-7P please use Epic Chat. To contact the covering physician during after hours 7P-7A, please review Amion.  03/11/2024, 4:42 PM   *This  document has been created with the assistance of dictation software. Please excuse typographical errors. *

## 2024-03-11 NOTE — Progress Notes (Signed)
*  PRELIMINARY RESULTS* Echocardiogram 2D Echocardiogram has been performed.  Rick [name] 03/11/2024, 2:42 PM

## 2024-03-11 NOTE — Plan of Care (Signed)

## 2024-03-11 NOTE — ED Notes (Signed)
 Updated pts sister on plan of care and admission to room 254.

## 2024-03-11 NOTE — Plan of Care (Signed)
   Problem: Activity: Goal: Risk for activity intolerance will decrease Outcome: Progressing   Problem: Nutrition: Goal: Adequate nutrition will be maintained Outcome: Progressing

## 2024-03-11 NOTE — ED Notes (Signed)
 Custody order received/waiting on [place] pd to sign

## 2024-03-12 DIAGNOSIS — B9629 Other Escherichia coli [E. coli] as the cause of diseases classified elsewhere: Secondary | ICD-10-CM

## 2024-03-12 DIAGNOSIS — F05 Delirium due to known physiological condition: Secondary | ICD-10-CM | POA: Diagnosis not present

## 2024-03-12 DIAGNOSIS — N390 Urinary tract infection, site not specified: Secondary | ICD-10-CM

## 2024-03-12 DIAGNOSIS — E119 Type 2 diabetes mellitus without complications: Secondary | ICD-10-CM

## 2024-03-12 DIAGNOSIS — H9190 Unspecified hearing loss, unspecified ear: Secondary | ICD-10-CM

## 2024-03-12 DIAGNOSIS — Z8659 Personal history of other mental and behavioral disorders: Secondary | ICD-10-CM

## 2024-03-12 DIAGNOSIS — G20C Parkinsonism, unspecified: Secondary | ICD-10-CM

## 2024-03-12 DIAGNOSIS — R410 Disorientation, unspecified: Secondary | ICD-10-CM | POA: Diagnosis not present

## 2024-03-12 DIAGNOSIS — N179 Acute kidney failure, unspecified: Secondary | ICD-10-CM

## 2024-03-12 DIAGNOSIS — Z1612 Extended spectrum beta lactamase (ESBL) resistance: Secondary | ICD-10-CM

## 2024-03-12 DIAGNOSIS — I480 Paroxysmal atrial fibrillation: Secondary | ICD-10-CM

## 2024-03-12 LAB — COMPREHENSIVE METABOLIC PANEL WITH GFR
ALT: <5 U/L (ref 0–44)
AST: 12 U/L — ABNORMAL LOW (ref 15–41)
Albumin: 2.6 g/dL — ABNORMAL LOW (ref 3.5–5.0)
Alkaline Phosphatase: 46 U/L (ref 38–126)
Anion gap: 8 (ref 5–15)
BUN: 22 mg/dL (ref 8–23)
CO2: 26 mmol/L (ref 22–32)
Calcium: 8.7 mg/dL — ABNORMAL LOW (ref 8.9–10.3)
Chloride: 105 mmol/L (ref 98–111)
Creatinine, Ser: 0.93 mg/dL (ref 0.61–1.24)
GFR, Estimated: >60 mL/min (ref >60)
Glucose, Bld: 165 mg/dL — ABNORMAL HIGH (ref 70–99)
Potassium: 3.7 mmol/L (ref 3.5–5.1)
Sodium: 139 mmol/L (ref 135–145)
Total Bilirubin: 0.9 mg/dL (ref 0.0–1.2)
Total Protein: 6 g/dL — ABNORMAL LOW (ref 6.5–8.1)

## 2024-03-12 LAB — URINE CULTURE: Culture: >=100000 — AB

## 2024-03-12 LAB — CBC WITH DIFFERENTIAL/PLATELET
Abs Immature Granulocytes: 0.06 K/uL (ref 0.00–0.07)
Basophils Absolute: 0 K/uL (ref 0.0–0.1)
Basophils Relative: 0 %
Eosinophils Absolute: 0.1 K/uL (ref 0.0–0.5)
Eosinophils Relative: 1 %
HCT: 36.6 % — ABNORMAL LOW (ref 39.0–52.0)
Hemoglobin: 11.9 g/dL — ABNORMAL LOW (ref 13.0–17.0)
Immature Granulocytes: 1 %
Lymphocytes Relative: 23 %
Lymphs Abs: 1.6 K/uL (ref 0.7–4.0)
MCH: 28.3 pg (ref 26.0–34.0)
MCHC: 32.5 g/dL (ref 30.0–36.0)
MCV: 87.1 fL (ref 80.0–100.0)
Monocytes Absolute: 1 K/uL (ref 0.1–1.0)
Monocytes Relative: 15 %
Neutro Abs: 4.2 K/uL (ref 1.7–7.7)
Neutrophils Relative %: 60 %
Platelets: 204 K/uL (ref 150–400)
RBC: 4.2 MIL/uL — ABNORMAL LOW (ref 4.22–5.81)
RDW: 14.5 % (ref 11.5–15.5)
WBC: 7 K/uL (ref 4.0–10.5)
nRBC: 0 % (ref 0.0–0.2)

## 2024-03-12 LAB — GLUCOSE, CAPILLARY
Glucose-Capillary: 148 mg/dL — ABNORMAL HIGH (ref 70–99)
Glucose-Capillary: 187 mg/dL — ABNORMAL HIGH (ref 70–99)
Glucose-Capillary: 128 mg/dL — ABNORMAL HIGH (ref 70–99)
Glucose-Capillary: 155 mg/dL — ABNORMAL HIGH (ref 70–99)

## 2024-03-12 LAB — PHOSPHORUS: Phosphorus: 2.8 mg/dL (ref 2.5–4.6)

## 2024-03-12 LAB — MAGNESIUM: Magnesium: 1.8 mg/dL (ref 1.7–2.4)

## 2024-03-12 MED ORDER — DICLOFENAC SODIUM 1 % EX GEL
2.0000 g | Freq: Four times a day (QID) | CUTANEOUS | Status: DC
  Administered 2024-03-14 – 2024-03-18 (×5): 2 g via TOPICAL
  Filled 2024-03-12 (×2): qty 100

## 2024-03-12 MED ORDER — PIPERACILLIN-TAZOBACTAM 3.375 G IVPB
3.3750 g | Freq: Three times a day (TID) | INTRAVENOUS | Status: DC
  Administered 2024-03-12 – 2024-03-18 (×18): 3.375 g via INTRAVENOUS
  Filled 2024-03-12 (×18): qty 50

## 2024-03-12 NOTE — Assessment & Plan Note (Addendum)
 Secondary to urinary infection.  Patient on Seroquel  and Depakote .  Psychiatry team following.  Patient still under IVC commitment as per psychiatry.  Patient today again demanding to go home.

## 2024-03-12 NOTE — Evaluation (Signed)
 Physical Therapy Evaluation Patient Details Name: Rick [name] MRN: [id number] DOB: [birth year] Today's Date: 03/12/2024  History of Present Illness  The patient is an 83 y.o. male with medical history significant of  parkinsonism on carbidopa  levodopa , hypertension, diabetes. Patient is acutely confused and very hard of hearing.  The following information was obtained from chart review and ER report: The patient was found at [facility] today and police were called due to patient reportedly demonstrating bizarre behaviors.   Clinical Impression  Patient alert, oriented to self. Very HOH, whiteboard used as able. Pt very labile during session, somewhat appropriate and then intermittently yelling, demanding, agitated. Re-directed as able. Pt unable to provide PLOF but did endorse living alone in a home he owns, and using a RW. Pt demanded assistance for bed mobility and transfers, anticipate assistance is not truly needed. Able to ambulate with CGA and RW ~11ft, no LOB. Pt had 3 freezing episodes that he referenced, did appear similar to festination of gait. The patient would benefit from skilled PT intervention to ensure return to PLOF and continued assessment of appropriateness to participate with therapy.         If plan is discharge home, recommend the following: Assistance with cooking/housework;Supervision due to cognitive status;Assist for transportation;Help with stairs or ramp for entrance;A little help with walking and/or transfers   Can travel by private vehicle        Equipment Recommendations Other (comment) (TBD)  Recommendations for Other Services       Functional Status Assessment Patient has had a recent decline in their functional status and demonstrates the ability to make significant improvements in function in a reasonable and predictable amount of time.     Precautions / Restrictions Precautions Precautions: Fall Recall of Precautions/Restrictions:  Impaired Restrictions Weight Bearing Restrictions Per Provider Order: No      Mobility  Bed Mobility Overal bed mobility: Needs Assistance Bed Mobility: Supine to Sit, Sit to Supine     Supine to sit: HOB elevated, Used rails, Min assist Sit to supine: Contact guard assist   General bed mobility comments: pt demanded assistance, but do not anticipate truly needing assistance. CGA to ensure safety when pt threw himself back in bed to avoid hitting his head on bed rail    Transfers Overall transfer level: Needs assistance Equipment used: Rolling walker (2 wheels) Transfers: Sit to/from Stand Sit to Stand: Min assist, +2 safety/equipment           General transfer comment: pt demanded assistance, provided but did not appear to be required    Ambulation/Gait Ambulation/Gait assistance: Contact guard assist, +2 safety/equipment Gait Distance (Feet): 95 Feet Assistive device: Rolling walker (2 wheels)         General Gait Details: 3 bouts of freezing per pt. did appear somewhat like festination gait. no LOB  Stairs            Wheelchair Mobility     Tilt Bed    Modified Rankin (Stroke Patients Only)       Balance Overall balance assessment: Needs assistance Sitting-balance support: Feet supported Sitting balance-Leahy Scale: Good     Standing balance support: Bilateral upper extremity supported, During functional activity Standing balance-Leahy Scale: Fair                               Pertinent Vitals/Pain Pain Assessment Pain Assessment: Faces Faces Pain Scale: No hurt    Home  Living Family/patient expects to be discharged to:: Private residence Living Arrangements: Alone                 Additional Comments: pt unable to provide PLOF. did endorse living alone, owning his house, and using a walker    Prior Function                       Extremity/Trunk Assessment   Upper Extremity Assessment Upper Extremity  Assessment: Overall WFL for tasks assessed    Lower Extremity Assessment Lower Extremity Assessment: Overall WFL for tasks assessed (able to lift and move BLE against gravity)       Communication        Cognition Arousal: Alert Behavior During Therapy: Restless, Agitated, Anxious, Impulsive, Lability   PT - Cognitive impairments: No family/caregiver present to determine baseline                       PT - Cognition Comments: pt oriented to self, difficulty assessing AMS, behavior, or HOH as limiting factors Following commands: Impaired Following commands impaired: Follows one step commands inconsistently     Cueing Cueing Techniques: Verbal cues, Gestural cues, Tactile cues, Visual cues     General Comments      Exercises     Assessment/Plan    PT Assessment Patient needs continued PT services  PT Problem List Decreased activity tolerance;Decreased balance;Decreased mobility;Decreased safety awareness       PT Treatment Interventions Balance training;Gait training;Neuromuscular re-education;DME instruction;Stair training;Functional mobility training;Patient/family education;Therapeutic activities;Therapeutic exercise    PT Goals (Current goals can be found in the Care Plan section)  Acute Rehab PT Goals Patient Stated Goal: to be left alone PT Goal Formulation: With patient Time For Goal Achievement: 03/26/24 Potential to Achieve Goals: Poor    Frequency Min 2X/week     Co-evaluation PT/OT/SLP Co-Evaluation/Treatment: Yes   PT goals addressed during session: Mobility/safety with mobility;Balance;Proper use of DME OT goals addressed during session: ADL's and self-care       AM-PAC PT 6 Clicks Mobility  Outcome Measure Help needed turning from your back to your side while in a flat bed without using bedrails?: None Help needed moving from lying on your back to sitting on the side of a flat bed without using bedrails?: None Help needed moving to  and from a bed to a chair (including a wheelchair)?: None Help needed standing up from a chair using your arms (e.g., wheelchair or bedside chair)?: None Help needed to walk in hospital room?: A Little Help needed climbing 3-5 steps with a railing? : A Little 6 Click Score: 22    End of Session   Activity Tolerance: Other (comment) (limited by cognition, agitation) Patient left: in bed;with call bell/phone within reach;with bed alarm set;with nursing/sitter in room Nurse Communication: Mobility status PT Visit Diagnosis: Difficulty in walking, not elsewhere classified (R26.2)    Time: 9147-9092 PT Time Calculation (min) (ACUTE ONLY): 15 min   Charges:   PT Evaluation $PT Eval Moderate Complexity: 1 Mod   PT General Charges $$ ACUTE PT VISIT: 1 Visit       Doyal Shams PT, DPT 10:43 AM,03/12/24

## 2024-03-12 NOTE — Evaluation (Signed)
 Occupational Therapy Evaluation Patient Details Name: Rick [name] MRN: [id number] DOB: [birth year] Today's Date: 03/12/2024   History of Present Illness   Pt is an 83 y.o. male admitted with AMS. Per chart, pt found at [facility] with bizzare behaviors and police were called, brought to [facility] ED due to EOB. PMH significant for parkinsonism on carbidopa  levodopa , HTN, diabetes. Patient is acutely confused and very hard of hearing.     Clinical Impressions Pt admitted with above diagnosis. PT/OT co-eval due to behaviors and alerted mental status. Pt extremely HOH, used whiteboard to communicate when able. Pt labile, alert to self only, conversational but intermittently yelling, demanding and agitated when asked to independently perform tasks. Anticipate pt physically able to perform self-care and mobility tasks with standby assist, but due to behaviors pt required MAX A to don socks bed level and MIN A to perform STS with RW. Pt completes functional mobility with RW + CGA ~90 ft in hallway, pt with freezing episodes but no LOB. Pt would benefit from skilled OT services to address noted impairments and functional limitations (see below for any additional details) in order to maximize safety and independence while minimizing falls risk and caregiver burden. Anticipate the need for follow up OT services upon acute hospital DC, will require 24/7 supervision and assist due to cognitive impairments.      If plan is discharge home, recommend the following:   A lot of help with walking and/or transfers;A lot of help with bathing/dressing/bathroom;Assistance with cooking/housework;Direct supervision/assist for medications management;Assist for transportation;Supervision due to cognitive status;Help with stairs or ramp for entrance;Direct supervision/assist for financial management     Functional Status Assessment   Patient has had a recent decline in their functional status and demonstrates the  ability to make significant improvements in function in a reasonable and predictable amount of time.     Equipment Recommendations   None recommended by OT      Precautions/Restrictions   Precautions Precautions: Fall Recall of Precautions/Restrictions: Impaired Restrictions Weight Bearing Restrictions Per Provider Order: No     Mobility Bed Mobility Overal bed mobility: Needs Assistance Bed Mobility: Supine to Sit, Sit to Supine     Supine to sit: HOB elevated, Used rails, Min assist Sit to supine: Contact guard assist   General bed mobility comments: pt demanding physical assist, yelling but does not need physical assist to perform tasks. pt threw himself back ito bed, CGA to avoid hitting head on rail    Transfers Overall transfer level: Needs assistance Equipment used: Rolling walker (2 wheels) Transfers: Sit to/from Stand Sit to Stand: Min assist, +2 safety/equipment           General transfer comment: pt demanding assistance, screaming when he did not recieve it. Pt redirected with firm correction. MIN A ultimately provided      Balance Overall balance assessment: Needs assistance Sitting-balance support: Feet supported Sitting balance-Leahy Scale: Good     Standing balance support: Bilateral upper extremity supported, During functional activity Standing balance-Leahy Scale: Fair                             ADL either performed or assessed with clinical judgement   ADL Overall ADL's : Needs assistance/impaired                     Lower Body Dressing: Maximal assistance;Bed level Lower Body Dressing Details (indicate cue type and reason): maxA to  don socks bed level 2/2 behaviors vs physical ability. pt able to doff socks when returned to bed. pt does not want to perform self-care tasks on own. Toilet Transfer: Electronics engineer Details (indicate cue type and reason): pt demonstrates physical ability  to complete mobility with RW, anticipate behaviors / mentation status / intrinsic motivation impacting actual performance         Functional mobility during ADLs: Rolling walker (2 wheels);Contact guard assist General ADL Comments: anticipate pt's current AMS contributing to limited initiation and performance of independent ADL performance. On eval, pt demonstrated physical ability but was difficult to motivate to perform any tasks independently, pt demanding and yelling when asked to do things himself. pt easily moves around in bed, can demo figure four lying in bed but refuses and is agitated when therapy team asked him to don socks.      Pertinent Vitals/Pain Pain Assessment Pain Assessment: Faces Faces Pain Scale: No hurt     Extremity/Trunk Assessment Upper Extremity Assessment Upper Extremity Assessment: Overall WFL for tasks assessed   Lower Extremity Assessment Lower Extremity Assessment: Overall WFL for tasks assessed   Cervical / Trunk Assessment Cervical / Trunk Assessment: Normal   Communication Communication Communication: Impaired Factors Affecting Communication: Hearing impaired;Other (comment) (whiteboard used. pt extremely HOH)   Cognition Arousal: Alert Behavior During Therapy: Restless, Agitated, Anxious, Impulsive, Lability Cognition: Cognition impaired   Orientation impairments: Place, Time, Situation Awareness: Intellectual awareness impaired Memory impairment (select all impairments): Short-term memory, Working [employer] fast streamer, Non-declarative long-term memory, Geneticist, molecular long-term memory Attention impairment (select first level of impairment): Focused attention Executive functioning impairment (select all impairments): Initiation, Organization, Sequencing, Reasoning, Problem solving OT - Cognition Comments: Pt labile, pleasant and conversational then begins and yelling in agitation with demanding behaviors/outbursts of cursing. Whiteboard used as able for  communication as pt VERY HOH                 Following commands: Impaired Following commands impaired: Follows one step commands inconsistently     Cueing  General Comments   Cueing Techniques: Verbal cues;Gestural cues;Tactile cues;Visual cues  Sitter in room start and end of session           Home Living Family/patient expects to be discharged to:: Private residence Living Arrangements: Alone                               Additional Comments: pt unable to provide PLOF. did endorse living alone, owning his house, and using a walker      Prior Functioning/Environment Prior Level of Function : Patient poor historian/Family not available                    OT Problem List: Decreased cognition;Decreased knowledge of precautions;Decreased knowledge of use of DME or AE;Decreased coordination;Decreased activity tolerance;Decreased safety awareness;Impaired balance (sitting and/or standing)   OT Treatment/Interventions: Self-care/ADL training;DME and/or AE instruction;Therapeutic activities;Balance training;Therapeutic exercise;Cognitive remediation/compensation;Neuromuscular education;Energy conservation;Patient/family education      OT Goals(Current goals can be found in the care plan section)   Acute Rehab OT Goals OT Goal Formulation: Patient unable to participate in goal setting Time For Goal Achievement: 03/26/24 Potential to Achieve Goals: Fair   OT Frequency:  Min 2X/week    Co-evaluation PT/OT/SLP Co-Evaluation/Treatment: Yes   PT goals addressed during session: Mobility/safety with mobility;Balance;Proper use of DME OT goals addressed during session: ADL's and self-care  AM-PAC OT 6 Clicks Daily Activity     Outcome Measure Help from another person eating meals?: None Help from another person taking care of personal grooming?: A Little Help from another person toileting, which includes using toliet, bedpan, or urinal?: A  Lot Help from another person bathing (including washing, rinsing, drying)?: A Lot Help from another person to put on and taking off regular upper body clothing?: A Lot Help from another person to put on and taking off regular lower body clothing?: A Lot 6 Click Score: 15   End of Session Equipment Utilized During Treatment: Rolling walker (2 wheels) Nurse Communication: Mobility status  Activity Tolerance: Patient tolerated treatment well Patient left: in bed;with call bell/phone within reach;with bed alarm set;with nursing/sitter in room  OT Visit Diagnosis: Unsteadiness on feet (R26.81);Other abnormalities of gait and mobility (R26.89);Other symptoms and signs involving cognitive function                Time: 9147-9092 OT Time Calculation (min): 15 min Charges:  OT General Charges $OT Visit: 1 Visit OT Evaluation $OT Eval Moderate Complexity: 1 Mod  Jerome Otter L. Aranza Geddes, OTR/L  03/12/24, 11:44 AM

## 2024-03-12 NOTE — Progress Notes (Signed)
 Progress Note   Patient: Rick [name] FMW:[id number] DOB: 04/27/1941 DOA: 03/10/2024     2 DOS: the patient was seen and examined on 03/12/2024   Brief hospital course: 83 y.o. male with medical history significant of medication induced parkinsonism on carbidopa  levodopa , hypertension, diabetes.  Patient is acutely confused and very hard of hearing.  The following information was obtained from chart review and ER report: The patient was found at [facility] today and police were called due to patient reportedly demonstrating bizarre behaviors.  Patient then got in car and attempted to drive and was stopped by 088.  While waiting in police car patient became short of breath which police report may have been secondary to heat.  EMS was called.  On arrival to the ED patient was agitated and confused.  He required multiple rounds of Versed .  He remains encephalopathic and requesting to discharge home.  ED physician placed IVC for patient. Labs are mostly unremarkable.  UA consistent with possible UTI.  Patient has been started on ceftriaxone . Reported rhythm on arrival is A-fib RVR.  Presently he is tremulous with significant artifact, but appears to be NSR in the 80s   When asking patient how he got here he has difficulty answering the question and continues to repeat that he would like to go home.   7/30.  Patient asking to go home.  Patient under involuntary commitment.  Brought in with acute metabolic encephalopathy.  ESBL E. coli growing out of urine culture.  Antibiotics switched over to Zosyn .    Assessment and Plan: Acute delirium Secondary to urinary infection.  Patient on Seroquel   Urinary tract infection due to extended-spectrum beta lactamase (ESBL) producing Escherichia coli Rocephin  switched over to Zosyn .  Await sensitivities.  AKI (acute kidney injury) (HCC) Proved from 1.32 down to 0.93.  History of bipolar disorder Psychiatry following  Paroxysmal atrial fibrillation  (HCC) Started on Coreg .  With his altered mental status they held off on any anticoagulation  Parkinsonism (HCC) Continue Sinemet   Hard of hearing Have to yell in his ear in order for him to hear.  Controlled type 2 diabetes mellitus without complication, without long-term current use of insulin  (HCC) Hemoglobin A1c 7.0.        Subjective: Patient asked to go home.  Patient just under involuntary commitment.  Brought in by police because he was acting strange.  Physical Exam: Vitals:   03/12/24 0300 03/12/24 0401 03/12/24 0732 03/12/24 1213  BP:  106/79 126/83 102/60  Pulse:  (!) 101 90 99  Resp: 20 18 20 18   Temp:  97.7 F (36.5 C) 98.3 F (36.8 C) 98.2 F (36.8 C)  TempSrc:    Oral  SpO2:  99% 96% 96%  Weight:      Height:       Physical Exam HENT:     Head: Normocephalic.  Eyes:     General: Lids are normal.     Conjunctiva/sclera: Conjunctivae normal.  Cardiovascular:     Rate and Rhythm: Normal rate. Rhythm irregularly irregular.     Heart sounds: Normal heart sounds, S1 normal and S2 normal.  Pulmonary:     Breath sounds: No decreased breath sounds, wheezing, rhonchi or rales.  Abdominal:     Palpations: Abdomen is soft.     Tenderness: There is no abdominal tenderness.  Musculoskeletal:     Right lower leg: No swelling.     Left lower leg: No swelling.  Skin:    General: Skin  is warm.     Findings: No rash.  Neurological:     Mental Status: He is alert and oriented to person, place, and time.     Data Reviewed: Hemoglobin A1c 7.0, urine culture growing ESBL E. coli, creatinine 0.93, white blood cell count 7.0, hemoglobin 11.9, platelet count 204  Family Communication: Spoke with sister on the phone  Disposition: Status is: Inpatient Remains inpatient appropriate because: ESBL E. coli growing out of urine culture changing antibiotics over to Zosyn .  Patient under involuntary commitment.  Planned Discharge Destination: Patient did well with  physical therapy.  Concerned about him living by himself.    Time spent: 28 minutes  Author: [name], MD 03/12/2024 1:00 PM  For on call review [email or web address].

## 2024-03-12 NOTE — Assessment & Plan Note (Addendum)
 Patient now with hyperglycemia.  Hemoglobin A1c 7.0.  Patient on sliding scale insulin  here.  Added Glucotrol  XL low-dose

## 2024-03-12 NOTE — Assessment & Plan Note (Addendum)
 Proved from 1.32 down to 0.95.

## 2024-03-12 NOTE — Assessment & Plan Note (Addendum)
 Rocephin  switched over to Zosyn  on 7/30.  Patient denies that he even has a urinary tract infection.  Zosyn  stopped on 8/5.  Dose of oral fosfomycin given today.

## 2024-03-12 NOTE — Hospital Course (Addendum)
 83 y.o. male with medical history significant of medication induced parkinsonism on carbidopa  levodopa , hypertension, diabetes.  Patient is acutely confused and very hard of hearing.  The following information was obtained from chart review and ER report: The patient was found at [facility] today and police were called due to patient reportedly demonstrating bizarre behaviors.  Patient then got in car and attempted to drive and was stopped by 088.  While waiting in police car patient became short of breath which police report may have been secondary to heat.  EMS was called.  On arrival to the ED patient was agitated and confused.  He required multiple rounds of Versed .  He remains encephalopathic and requesting to discharge home.  ED physician placed IVC for patient. Labs are mostly unremarkable.  UA consistent with possible UTI.  Patient has been started on ceftriaxone . Reported rhythm on arrival is A-fib RVR.  Presently he is tremulous with significant artifact, but appears to be NSR in the 80s   When asking patient how he got here he has difficulty answering the question and continues to repeat that he would like to go home.   7/30.  Patient asking to go home.  Patient under involuntary commitment.  Brought in with acute metabolic encephalopathy.  ESBL E. coli growing out of urine culture.  Antibiotics switched over to Zosyn . 7/31.  Continue Zosyn .  Patient tried to kick me because he was agitated that I would not let him go home 8/1.  Continue Zosyn .  Patient calmer today and able to have conversation. 8/2.  Patient calm today and answers questions.  Blood pressure little on the lower side will switch Toprol  over to amiodarone .  Still under IVC commitment as per psychiatry. 8/3.  Patient again demanding to go home today.  Still under IVC for commitment.  Agreeable to IM B12 shot. 8/4.  Patient a little more agitated. 8/5.  Patient happy about IVC rescinded and he will go home tomorrow.  Heart rate  went up to 144 with physical therapy today. 8/6.  Discharge home with home health.  [facility] referral.

## 2024-03-12 NOTE — Assessment & Plan Note (Signed)
-

## 2024-03-12 NOTE — Assessment & Plan Note (Signed)
 Have to yell in his ear in order for him to hear.

## 2024-03-12 NOTE — Assessment & Plan Note (Addendum)
 With heart rate going up to 144 with ambulation yesterday with physical therapy will restart low-dose Toprol -XL 12.5 mg daily and increase amiodarone  to 400 mg twice daily for 6 days then 200 mg twice a day for 14 days then 200 mg daily after that.  Unable to talk to him about blood thinner at this time.  Patient is very hard of hearing and I am concerned about starting a blood thinner if he is unable to understand what I am talking to him about.  Patient also has unsteady gait and high risk of falls I will hold off on blood thinner at this time.

## 2024-03-12 NOTE — Plan of Care (Signed)
  Problem: Education: Goal: Ability to describe self-care measures that may prevent or decrease complications (Diabetes Survival Skills Education) will improve Outcome: Not Progressing   Problem: Education: Goal: Knowledge of General Education information will improve Description: Including pain rating scale, medication(s)/side effects and non-pharmacologic comfort measures Outcome: Not Progressing

## 2024-03-12 NOTE — Consult Note (Signed)
 [facility] Psychiatric Consult Initial  Patient Name: .Rick [name]  MRN: [id number]  DOB: [birth year]  Consult Order details:  Orders (From admission, onward)     Start     Ordered   03/10/24 1414  IP CONSULT TO PSYCHIATRY       Ordering Provider: Dicky Anes, MD  Provider:  (Not yet assigned)  Question Answer Comment  Place call to: psych md   Reason for Consult Consult   Diagnosis/Clinical Info for Consult: mental capacity, altered sensorium, IVC      03/10/24 1413             Mode of Visit: In person    Psychiatry Consult Evaluation  Service Date: March 12, 2024 LOS:  LOS: 2 days  Chief Complaint: consult for capacity, IVC status   Primary Psychiatric Diagnoses  Delirium due to multiple etiologies-UTI parkinsonism on Sinemet    Assessment  Rick [name] is a 83 y.o. male admitted: Medically      The patient is an 83 y.o. male with medical history significant of  parkinsonism on carbidopa  levodopa , hypertension, diabetes. Patient is acutely confused and very hard of hearing.  The following information was obtained from chart review and ER report: The patient was found at [facility] today and police were called due to patient reportedly demonstrating bizarre behaviors.  Patient then got in car and attempted to drive and was stopped by 088.  While waiting in police car patient became short of breath which police report may have been secondary to heat.  EMS was called.  On arrival to the ED patient was agitated and confused.  He required multiple rounds of Versed .  He remains encephalopathic and requesting to discharge home.  ED physician placed IVC for patient.Labs are mostly unremarkable.  UA consistent with possible UTI.  Patient has been started on ceftriaxone . Reported rhythm on arrival is A-fib RVR.  Presently he is tremulous with significant artifact, but appears to be NSR in the 80s. When asking patient how he got here he has difficulty answering the question and  continues to repeat that he would like to go home.  No further review of systems or medication history was able to be obtained.       Psychiatry consulted for assessment of capacity and IVC status.  03/12/2024: Patient mental status has improved and per sitter no behavioral outbursts.  He is working with PT and is taking his medications.  He is not displaying any unsafe behaviors.  Will continue to evaluate the need for IVC tomorrow after we reach out to his daughter.  We reached out to sister who is unable to give that much details about his baseline but did acknowledge that patient has cranky mood and gets upset very easily.  No indication of inpatient psychiatric hospitalization.  Will reassess patient tomorrow for the need of IVC.  Diagnoses:  Active Hospital problems: Active Problems:   Controlled type 2 diabetes mellitus without complication, without long-term current use of insulin  (HCC)   Hard of hearing   Urinary tract infection due to extended-spectrum beta lactamase (ESBL) producing Escherichia coli   Acute delirium   Parkinsonism (HCC)   Paroxysmal atrial fibrillation (HCC)   History of bipolar disorder   AKI (acute kidney injury) (HCC)    Plan   ## Psychiatric Medication Recommendations:  Patient continues to exhibit s/sx of delirium. Potential causes include infection (UTI), B12 deficiency, medication induced (Sinemet  appears to have been increased about 6 months ago). IVC  will be maintained at this time, and we will reach out to family to better understand patient's baseline.  -Continue Ativan  prn for agitation; allergy to Haldol  -Continue Sinemet  as prescribed -Strict delirium precautions  -Will consider adding an anti-psychotic to stabilize mood (Seroquel -25 mg nightly and as needed 12.5 mg every 8 hours as needed agitation will also consider Depakote  -UTI treatment with Ceftriaxone  -B12 deficiency as potential cause for acute delirium? Decreased albumin, decreased total  protein, and patient's appearance consistent with deficiency due to insufficient intake. Consider replacement with IM injection or PO therapy   ## Medical Decision Making Capacity: Patient's mental status improving and is awake, alert, oriented x 3.  He is agreeable to the treatment and medications  ## Further Work-up:   -- most recent EKG on 03/10/24 had QtC of 445 ms -- Pertinent labwork reviewed earlier this admission includes: hyperglycemia with A1C of 7%, decreased albumin of 2.6, total protein 5.6, vitamin B12 deficiency of 137, Hb 12.3, Hct 37.4   ## Disposition:-- Plan Post Discharge/Psychiatric Care Follow-up resources will continue to follow-up until we understand his baseline.  No psychiatric indication for inpatient hospitalization at this time.  Will manage the behaviors on the medical floor  ## Behavioral / Environmental: -Delirium Precautions: Delirium Interventions for Nursing and Staff: - RN to open blinds every AM. - To Bedside: Glasses, hearing aide, and pt's own shoes. Make available to patients. when possible and encourage use. - Encourage po fluids when appropriate, keep fluids within reach. - OOB to chair with meals. - Passive ROM exercises to all extremities with AM & PM care. - RN to assess orientation to person, time and place QAM and PRN. - Recommend extended visitation hours with familiar family/friends as feasible. - Staff to minimize disturbances at night. Turn off television when pt asleep or when not in use., To minimize splitting of staff, assign one staff person to communicate all information from the team when feasible., or Utilize compassion and acknowledge the patient's experiences while setting clear and realistic expectations for care.    ## Safety and Observation Level:  - Based on my clinical evaluation, I estimate the patient to be at low risk of self harm in the current setting. - At this time, we recommend  routine. This decision is based on my review of  the chart including patient's history and current presentation, interview of the patient, mental status examination, and consideration of suicide risk including evaluating suicidal ideation, plan, intent, suicidal or self-harm behaviors, risk factors, and protective factors. This judgment is based on our ability to directly address suicide risk, implement suicide prevention strategies, and develop a safety plan while the patient is in the clinical setting. Please contact our team if there is a concern that risk level has changed.  CSSR Risk Category:C-SSRS RISK CATEGORY: No Risk  Suicide Risk Assessment: Patient has following modifiable risk factors for suicide: social isolation, which we are addressing by monitoring behaviors on unit and collaborating with family members. Patient has following non-modifiable or demographic risk factors for suicide: male gender Patient has the following protective factors against suicide: Supportive family  Thank you for this consult request. Recommendations have been communicated to the primary team.  We will continue to monitor at this time.   Rick Osborn, MD       History of Present Illness  Relevant Aspects of Hospital   03/12/2024: Patient is resting in bed and sitter was present room.  Per sitter patient is doing very well  and is very pleasant.  On interview patient is awake and alert oriented to self, location and situation.  He was able to tell the month as July and the year is 2025 and he was asked about the president he got very upset and cussed at the provider.  He immediately jokes around and calms down.  He reports living by himself in the house but reports that his son visits him every day.  He denies feeling depressed or anxious.  He reports that he is doing well he is having fair appetite and sleep.  He denies having any complaints and is not endorsing any SI/HI/plan.  He is not responding to internal stimuli.  Collateral information:  Called  sister listed in the chart who informed patient having daughter and son    Psychiatric and Social History  Psychiatric History:  Information collected from patient, chart review.  Prev Dx/Sx: Medication-induced parkinsonism on sinemet  IR, HTN, DM2, BPH. Does have diagnosis of anxiety, bipolar disorder, and depression in chart, but unable to corroborate with patient. Current Psych Provider: None noted on chart review Home Meds (current): Carbidopa -levodopa  25-100 mg, Carvedilol , gabapentin  600 mg PO bid, Metformin , Trazodone  100 mg, others as seen on med list Previous Med Trials: Chart review shows hx of Remeron , Lamictal. Unable to ask Therapy: Unable to ask  Prior Psych Hospitalization: Unable to ask  Prior Self Harm: Unable to ask Prior Violence: Unable to ask  Family Psych History: Unable to ask, but none seen on chart review Family Hx suicide: Unable to ask, but none seen on chart review  Social History:   Educational Hx: Unable to ask Occupational Hx: Unable to ask Legal Hx: Unable to ask Living Situation: Lives in [place] by himself Spiritual Hx: Unable to ask Access to weapons/lethal means: Unable to ask   Substance History Alcohol: Denies use  History of alcohol withdrawal seizures: n/a History of DT's: n/a Tobacco: Former smoker, quit in 2018 Illicit drugs: Denies Prescription drug abuse: Denies  Exam Findings  Physical Exam: Patient laying comfortably, NAD and eating soft diet. Appears frail, right leg tremor seen. Breathing is non-labored, and patient able to talk. Is alert ,oriented to self, location and situation. Hard of hearing and does not consistently answer questions. Attention, concentration, and orientation are all improving at this time.   Vital Signs:  Temp:  [97.7 F (36.5 C)-100.7 F (38.2 C)] 100.7 F (38.2 C) (07/30 2107) Pulse Rate:  [90-101] 94 (07/30 2107) Resp:  [17-21] 17 (07/30 2107) BP: (101-133)/(60-83) 101/81 (07/30 2107) SpO2:   [95 %-99 %] 95 % (07/30 2107) Blood pressure 101/81, pulse 94, temperature (!) 100.7 F (38.2 C), temperature source Oral, resp. rate 17, height 5' 4 (1.626 m), weight 69.9 kg, SpO2 95%. Body mass index is 26.47 kg/m.    Mental Status Exam: General Appearance: stated age  Orientation:  Other:  self and home address  Memory:  Immediate;   Poor  Concentration:  Concentration: Poor, possibly compounded by difficulty hearing  Recall:  Poor  Attention  Poor  Eye Contact:  fair  Speech:  Pressured  Language:  Good  Volume:  Increased  Mood: fine  Affect:  animated  Thought Process:  linear  Thought Content:  Illogical at times  Suicidal Thoughts:  not endorsing  Homicidal Thoughts:  not endorsing  Judgement:  Impaired  Insight:  Lacking  Psychomotor Activity:  Tremor  Akathisia:  No  Fund of Knowledge:  Poor      Assets:  Housing  Social Support  Cognition:  Impaired,  Moderate  ADL's:  Impaired  AIMS (if indicated):       Other History   These have been pulled in through the EMR, reviewed, and updated if appropriate.  Family History:  The patient's family history includes Cancer in his brother; Diabetes in his father; Transient ischemic attack in his mother.  Medical History: Past Medical History:  Diagnosis Date   Anxiety    Bipolar disorder (HCC)    Bladder cancer (HCC)    BPH (benign prostatic hypertrophy)    Chest pain, atypical 06/23/2015   Closed trimalleolar fracture of left ankle 10/01/2015   Cystitis    hx of    Depression    Diabetes mellitus without complication (HCC)    type 2    Difficult or painful urination 10/31/2012   Dysrhythmia    Ear problem 03/24/2015   Gangrenous appendicitis    H/O urinary disorder 03/27/2013   Herpes genitalis in men    Hyperlipidemia    Hypertension    Insomnia    Kidney stones    Left hand pain 05/18/2015   Mass of arm 02/10/2015   Peripheral neuropathy    Skin cancer    UD (urethral discharge) 10/31/2012   Urinary  tract infection    hx of     Surgical History: Past Surgical History:  Procedure Laterality Date   APPENDECTOMY     RUPTURED   BLADDER SURGERY     CHOLECYSTECTOMY     COLOSTOMY     AND LATER CLOSURE   CYSTOSCOPY/URETEROSCOPY/HOLMIUM LASER/STENT PLACEMENT Left 03/22/2018   Procedure: CYSTOSCOPY/LEFT URETEROSCOPY/LEFT RETROGRADE PYELOGRAM;  Surgeon: Carolee Sherwood JONETTA Wayne, MD;  Location: [facility];  Service: Urology;  Laterality: Left;   EPIDIDYMECTOMY N/A 01/03/2019   Procedure: RIGHT EPIDIDYMAL CYST REMOVAL VERSES EPIDIDMECTOMY;  Surgeon: Carolee Sherwood JONETTA Clennon, MD;  Location: [facility];  Service: Urology;  Laterality: N/A;   FRACTURE SURGERY     HERNIA REPAIR     INNER EAR SURGERY Left    ORIF ANKLE FRACTURE Left 09/27/2015   Procedure: OPEN REDUCTION INTERNAL FIXATION (ORIF) ANKLE FRACTURE;  Surgeon: Norleen JINNY Maltos, MD;  Location: [facility];  Service: Orthopedics;  Laterality: Left;   SKIN CANCER EXCISION     TRANSURETHRAL RESECTION OF PROSTATE N/A 03/22/2018   Procedure: TRANSURETHRAL RESECTION OF THE PROSTATE (TURP);  Surgeon: Carolee Sherwood JONETTA Chaden, MD;  Location: [facility];  Service: Urology;  Laterality: N/A;   VENTRAL HERNIA REPAIR N/A 01/14/2016   Procedure: HERNIA REPAIR VENTRAL ADULT;  Surgeon: Larinda Unknown Sharps, MD;  Location: [facility];  Service: General;  Laterality: N/A;     Medications:   Current Facility-Administered Medications:    acetaminophen  (TYLENOL ) tablet 650 mg, 650 mg, Oral, Q6H PRN, Dezii, Alexandra, DO   carbidopa -levodopa  (SINEMET  IR) 25-100 MG per tablet immediate release 1 tablet, 1 tablet, Oral, TID, Dezii, Alexandra, DO, 1 tablet at 03/12/24 2104   carvedilol  (COREG ) tablet 6.25 mg, 6.25 mg, Oral, BID WC, Dezii, Alexandra, DO, 6.25 mg at 03/12/24 1659   cyanocobalamin  (VITAMIN B12) tablet 500 mcg, 500 mcg, Oral, Daily, Dezii, Alexandra, DO, 500 mcg at 03/12/24 9065   diclofenac  Sodium (VOLTAREN ) 1 % topical gel 2 g, 2 g, Topical, QID, Wieting, Richard, MD   heparin  injection  5,000 Units, 5,000 Units, Subcutaneous, Q8H, Dezii, Alexandra, DO, 5,000 Units at 03/12/24 2105   HYDROcodone -acetaminophen  (NORCO/VICODIN) 5-325 MG per tablet 1 tablet, 1 tablet, Oral, Q6H PRN, Dezii, Alexandra, DO, 1 tablet  at 03/12/24 2109   insulin  aspart (novoLOG ) injection 0-9 Units, 0-9 Units, Subcutaneous, TID WC, Dezii, Alexandra, DO, 1 Units at 03/12/24 1659   LORazepam  (ATIVAN ) injection 2 mg, 2 mg, Intravenous, Q4H PRN, Dezii, Alexandra, DO   morphine  (PF) 2 MG/ML injection 2 mg, 2 mg, Intravenous, Q4H PRN, Dezii, Alexandra, DO   piperacillin -tazobactam (ZOSYN ) IVPB 3.375 g, 3.375 g, Intravenous, Q8H, Wieting, Richard, MD, Last Rate: 12.5 mL/hr at 03/12/24 2106, 3.375 g at 03/12/24 2106   QUEtiapine  (SEROQUEL ) tablet 12.5 mg, 12.5 mg, Oral, Q8H PRN, Bethannie Iglehart, MD   QUEtiapine  (SEROQUEL ) tablet 25 mg, 25 mg, Oral, QHS, Sindia Kowalczyk, MD, 25 mg at 03/12/24 2105  Allergies: Allergies  Allergen Reactions   Blood-Group Specific Substance    Haloperidol Other (See Comments)    Unknown   Sulfa Antibiotics Other (See Comments)    Unknown    Allyn Foil, MD

## 2024-03-12 NOTE — Assessment & Plan Note (Signed)
 Psychiatry following.  Patient on Seroquel  and Depakote .

## 2024-03-12 NOTE — Plan of Care (Signed)

## 2024-03-13 DIAGNOSIS — N390 Urinary tract infection, site not specified: Secondary | ICD-10-CM | POA: Diagnosis not present

## 2024-03-13 DIAGNOSIS — R410 Disorientation, unspecified: Secondary | ICD-10-CM | POA: Diagnosis not present

## 2024-03-13 DIAGNOSIS — F05 Delirium due to known physiological condition: Secondary | ICD-10-CM | POA: Diagnosis not present

## 2024-03-13 DIAGNOSIS — N179 Acute kidney failure, unspecified: Secondary | ICD-10-CM | POA: Diagnosis not present

## 2024-03-13 DIAGNOSIS — G20C Parkinsonism, unspecified: Secondary | ICD-10-CM | POA: Diagnosis not present

## 2024-03-13 DIAGNOSIS — Z8659 Personal history of other mental and behavioral disorders: Secondary | ICD-10-CM | POA: Diagnosis not present

## 2024-03-13 LAB — COMPREHENSIVE METABOLIC PANEL WITH GFR
ALT: <5 U/L (ref 0–44)
AST: 14 U/L — ABNORMAL LOW (ref 15–41)
Albumin: 2.9 g/dL — ABNORMAL LOW (ref 3.5–5.0)
Alkaline Phosphatase: 56 U/L (ref 38–126)
Anion gap: 9 (ref 5–15)
BUN: 24 mg/dL — ABNORMAL HIGH (ref 8–23)
CO2: 25 mmol/L (ref 22–32)
Calcium: 8.9 mg/dL (ref 8.9–10.3)
Chloride: 103 mmol/L (ref 98–111)
Creatinine, Ser: 0.87 mg/dL (ref 0.61–1.24)
GFR, Estimated: >60 mL/min (ref >60)
Glucose, Bld: 154 mg/dL — ABNORMAL HIGH (ref 70–99)
Potassium: 3.9 mmol/L (ref 3.5–5.1)
Sodium: 137 mmol/L (ref 135–145)
Total Bilirubin: 1.1 mg/dL (ref 0.0–1.2)
Total Protein: 7 g/dL (ref 6.5–8.1)

## 2024-03-13 LAB — GLUCOSE, CAPILLARY
Glucose-Capillary: 153 mg/dL — ABNORMAL HIGH (ref 70–99)
Glucose-Capillary: 195 mg/dL — ABNORMAL HIGH (ref 70–99)
Glucose-Capillary: 183 mg/dL — ABNORMAL HIGH (ref 70–99)
Glucose-Capillary: 242 mg/dL — ABNORMAL HIGH (ref 70–99)

## 2024-03-13 LAB — CBC WITH DIFFERENTIAL/PLATELET
Abs Immature Granulocytes: 0.06 K/uL (ref 0.00–0.07)
Basophils Absolute: 0 K/uL (ref 0.0–0.1)
Basophils Relative: 1 %
Eosinophils Absolute: 0.1 K/uL (ref 0.0–0.5)
Eosinophils Relative: 1 %
HCT: 40.3 % (ref 39.0–52.0)
Hemoglobin: 13.3 g/dL (ref 13.0–17.0)
Immature Granulocytes: 1 %
Lymphocytes Relative: 19 %
Lymphs Abs: 1.2 K/uL (ref 0.7–4.0)
MCH: 28.7 pg (ref 26.0–34.0)
MCHC: 33 g/dL (ref 30.0–36.0)
MCV: 86.9 fL (ref 80.0–100.0)
Monocytes Absolute: 1 K/uL (ref 0.1–1.0)
Monocytes Relative: 14 %
Neutro Abs: 4.3 K/uL (ref 1.7–7.7)
Neutrophils Relative %: 64 %
Platelets: 217 K/uL (ref 150–400)
RBC: 4.64 MIL/uL (ref 4.22–5.81)
RDW: 14.4 % (ref 11.5–15.5)
WBC: 6.6 K/uL (ref 4.0–10.5)
nRBC: 0 % (ref 0.0–0.2)

## 2024-03-13 LAB — MAGNESIUM: Magnesium: 1.8 mg/dL (ref 1.7–2.4)

## 2024-03-13 LAB — PHOSPHORUS: Phosphorus: 3.3 mg/dL (ref 2.5–4.6)

## 2024-03-13 MED ORDER — CARVEDILOL 3.125 MG PO TABS
3.1250 mg | ORAL_TABLET | Freq: Two times a day (BID) | ORAL | Status: DC
  Administered 2024-03-13: 3.125 mg via ORAL
  Filled 2024-03-13: qty 1

## 2024-03-13 MED ORDER — DIVALPROEX SODIUM ER 250 MG PO TB24
500.0000 mg | ORAL_TABLET | Freq: Every day | ORAL | Status: DC
  Administered 2024-03-13 – 2024-03-19 (×7): 500 mg via ORAL
  Filled 2024-03-13 (×7): qty 2

## 2024-03-13 NOTE — Care Management Important Message (Signed)
 Important Message  Patient Details  Name: Rick [name] MRN: [id number] Date of Birth: [birth year]   Important Message Given:  Yes - Medicare IM     Halil Rentz W, CMA 03/13/2024, 12:06 PM

## 2024-03-13 NOTE — Plan of Care (Signed)

## 2024-03-13 NOTE — Consult Note (Signed)
 [facility] Psychiatric Consult Follow up  Patient Name: .Rick [name]  MRN: [id number]  DOB: 1941/04/05  Consult Order details:  Orders (From admission, onward)     Start     Ordered   03/10/24 1414  IP CONSULT TO PSYCHIATRY       Ordering Provider: Dicky Anes, MD  Provider:  (Not yet assigned)  Question Answer Comment  Place call to: psych md   Reason for Consult Consult   Diagnosis/Clinical Info for Consult: mental capacity, altered sensorium, IVC      03/10/24 1413             Mode of Visit: In person    Psychiatry Consult Evaluation  Service Date: March 13, 2024 LOS:  LOS: 3 days  Chief Complaint: consult for capacity, IVC status   Primary Psychiatric Diagnoses  Delirium due to multiple etiologies-UTI parkinsonism on Sinemet    Assessment  Rick [name] is a 83 y.o. male admitted: Medically      The patient is an 83 y.o. male with medical history significant of  parkinsonism on carbidopa  levodopa , hypertension, diabetes. Patient is acutely confused and very hard of hearing.  The following information was obtained from chart review and ER report: The patient was found at [facility] today and police were called due to patient reportedly demonstrating bizarre behaviors.  Patient then got in car and attempted to drive and was stopped by 088.  While waiting in police car patient became short of breath which police report may have been secondary to heat.  EMS was called.  On arrival to the ED patient was agitated and confused.  He required multiple rounds of Versed .  He remains encephalopathic and requesting to discharge home.  ED physician placed IVC for patient.Labs are mostly unremarkable.  UA consistent with possible UTI.  Patient has been started on ceftriaxone . Reported rhythm on arrival is A-fib RVR.  Presently he is tremulous with significant artifact, but appears to be NSR in the 80s. When asking patient how he got here he has difficulty answering the question and  continues to repeat that he would like to go home.  No further review of systems or medication history was able to be obtained.       Psychiatry consulted for assessment of capacity and IVC status.  03/13/24:  Diagnoses:  Active Hospital problems: Active Problems:   Controlled type 2 diabetes mellitus without complication, without long-term current use of insulin  (HCC)   Hard of hearing   Urinary tract infection due to extended-spectrum beta lactamase (ESBL) producing Escherichia coli   Acute delirium   Parkinsonism (HCC)   Paroxysmal atrial fibrillation (HCC)   History of bipolar disorder   AKI (acute kidney injury) (HCC)    Plan   ## Psychiatric Medication Recommendations:  Patient continues to exhibit s/sx of delirium. Potential causes include infection (UTI), B12 deficiency, medication induced (Sinemet  appears to have been increased about 6 months ago). IVC will be maintained at this time, and we will reach out to family to better understand patient's baseline.  -Continue Ativan  prn for agitation; allergy to Haldol  -Continue Sinemet  as prescribed -Strict delirium precautions  -Will consider adding an anti-psychotic to stabilize mood (Seroquel -25 mg nightly and as needed 12.5 mg every 8 hours as needed agitation will also consider Depakote  -UTI treatment with Ceftriaxone  -B12 deficiency as potential cause for acute delirium? Decreased albumin, decreased total protein, and patient's appearance consistent with deficiency due to insufficient intake. Consider replacement with IM  injection or PO therapy   ## Medical Decision Making Capacity: Patient's mental status improving and is awake, alert, oriented x 3.  He is agreeable to the treatment and medications  ## Further Work-up:   -- most recent EKG on 03/10/24 had QtC of 445 ms -- Pertinent labwork reviewed earlier this admission includes: hyperglycemia with A1C of 7%, decreased albumin of 2.6, total protein 5.6, vitamin B12  deficiency of 137, Hb 12.3, Hct 37.4   ## Disposition:-- Plan Post Discharge/Psychiatric Care Follow-up resources will continue to follow-up until we understand his baseline.  No psychiatric indication for inpatient hospitalization at this time.  Will manage the behaviors on the medical floor  ## Behavioral / Environmental: -Delirium Precautions: Delirium Interventions for Nursing and Staff: - RN to open blinds every AM. - To Bedside: Glasses, hearing aide, and pt's own shoes. Make available to patients. when possible and encourage use. - Encourage po fluids when appropriate, keep fluids within reach. - OOB to chair with meals. - Passive ROM exercises to all extremities with AM & PM care. - RN to assess orientation to person, time and place QAM and PRN. - Recommend extended visitation hours with familiar family/friends as feasible. - Staff to minimize disturbances at night. Turn off television when pt asleep or when not in use., To minimize splitting of staff, assign one staff person to communicate all information from the team when feasible., or Utilize compassion and acknowledge the patient's experiences while setting clear and realistic expectations for care.    ## Safety and Observation Level:  - Based on my clinical evaluation, I estimate the patient to be at low risk of self harm in the current setting. - At this time, we recommend  routine. This decision is based on my review of the chart including patient's history and current presentation, interview of the patient, mental status examination, and consideration of suicide risk including evaluating suicidal ideation, plan, intent, suicidal or self-harm behaviors, risk factors, and protective factors. This judgment is based on our ability to directly address suicide risk, implement suicide prevention strategies, and develop a safety plan while the patient is in the clinical setting. Please contact our team if there is a concern that risk level has  changed.  CSSR Risk Category:C-SSRS RISK CATEGORY: No Risk  Suicide Risk Assessment: Patient has following modifiable risk factors for suicide: social isolation, which we are addressing by monitoring behaviors on unit and collaborating with family members. Patient has following non-modifiable or demographic risk factors for suicide: male gender Patient has the following protective factors against suicide: Supportive family  Thank you for this consult request. Recommendations have been communicated to the primary team.  We will continue to monitor at this time.   Allyn Foil, MD       History of Present Illness  Relevant Aspects of Hospital   03/13/24:  Collateral information:  Called sister listed in the chart who informed patient having daughter and son    Psychiatric and Social History  Psychiatric History:  Information collected from patient, chart review.  Prev Dx/Sx: Medication-induced parkinsonism on sinemet  IR, HTN, DM2, BPH. Does have diagnosis of anxiety, bipolar disorder, and depression in chart, but unable to corroborate with patient. Current Psych Provider: None noted on chart review Home Meds (current): Carbidopa -levodopa  25-100 mg, Carvedilol , gabapentin  600 mg PO bid, Metformin , Trazodone  100 mg, others as seen on med list Previous Med Trials: Chart review shows hx of Remeron , Lamictal. Unable to ask Therapy: Unable to ask  Prior  Psych Hospitalization: Unable to ask  Prior Self Harm: Unable to ask Prior Violence: Unable to ask  Family Psych History: Unable to ask, but none seen on chart review Family Hx suicide: Unable to ask, but none seen on chart review  Social History:   Educational Hx: Unable to ask Occupational Hx: Unable to ask Legal Hx: Unable to ask Living Situation: Lives in [place] by himself Spiritual Hx: Unable to ask Access to weapons/lethal means: Unable to ask   Substance History Alcohol: Denies use  History of alcohol withdrawal  seizures: n/a History of DT's: n/a Tobacco: Former smoker, quit in 2018 Illicit drugs: Denies Prescription drug abuse: Denies  Exam Findings  Physical Exam: Patient laying comfortably, NAD and eating soft diet. Appears frail, right leg tremor seen. Breathing is non-labored, and patient able to talk. Is alert ,oriented to self, location and situation. Hard of hearing and does not consistently answer questions. Attention, concentration, and orientation are all improving at this time.   Vital Signs:  Temp:  [97.8 F (36.6 C)-100.7 F (38.2 C)] 99.5 F (37.5 C) (07/31 1514) Pulse Rate:  [90-99] 99 (07/31 1514) Resp:  [16-18] 18 (07/31 1514) BP: (97-118)/(67-81) 100/67 (07/31 1514) SpO2:  [95 %-96 %] 95 % (07/31 1514) Blood pressure 100/67, pulse 99, temperature 99.5 F (37.5 C), temperature source Oral, resp. rate 18, height 5' 4 (1.626 m), weight 69.9 kg, SpO2 95%. Body mass index is 26.47 kg/m.    Mental Status Exam: General Appearance: stated age  Orientation:  Other:  self and home address  Memory:  Immediate;   Poor  Concentration:  Concentration: Poor, possibly compounded by difficulty hearing  Recall:  Poor  Attention  Poor  Eye Contact:  fair  Speech:  Pressured  Language:  Good  Volume:  Increased  Mood: fine  Affect:  animated  Thought Process:  linear  Thought Content:  Illogical at times  Suicidal Thoughts:  not endorsing  Homicidal Thoughts:  not endorsing  Judgement:  Impaired  Insight:  Lacking  Psychomotor Activity:  Tremor  Akathisia:  No  Fund of Knowledge:  Poor      Assets:  Housing Social Support  Cognition:  Impaired,  Moderate  ADL's:  Impaired  AIMS (if indicated):       Other History   These have been pulled in through the EMR, reviewed, and updated if appropriate.  Family History:  The patient's family history includes Cancer in his brother; Diabetes in his father; Transient ischemic attack in his mother.  Medical History: Past  Medical History:  Diagnosis Date   Anxiety    Bipolar disorder (HCC)    Bladder cancer (HCC)    BPH (benign prostatic hypertrophy)    Chest pain, atypical 06/23/2015   Closed trimalleolar fracture of left ankle 10/01/2015   Cystitis    hx of    Depression    Diabetes mellitus without complication (HCC)    type 2    Difficult or painful urination 10/31/2012   Dysrhythmia    Ear problem 03/24/2015   Gangrenous appendicitis    H/O urinary disorder 03/27/2013   Herpes genitalis in men    Hyperlipidemia    Hypertension    Insomnia    Kidney stones    Left hand pain 05/18/2015   Mass of arm 02/10/2015   Peripheral neuropathy    Skin cancer    UD (urethral discharge) 10/31/2012   Urinary tract infection    hx of     Surgical  History: Past Surgical History:  Procedure Laterality Date   APPENDECTOMY     RUPTURED   BLADDER SURGERY     CHOLECYSTECTOMY     COLOSTOMY     AND LATER CLOSURE   CYSTOSCOPY/URETEROSCOPY/HOLMIUM LASER/STENT PLACEMENT Left 03/22/2018   Procedure: CYSTOSCOPY/LEFT URETEROSCOPY/LEFT RETROGRADE PYELOGRAM;  Surgeon: Carolee Sherwood JONETTA Vyom, MD;  Location: [facility];  Service: Urology;  Laterality: Left;   EPIDIDYMECTOMY N/A 01/03/2019   Procedure: RIGHT EPIDIDYMAL CYST REMOVAL VERSES EPIDIDMECTOMY;  Surgeon: Carolee Sherwood JONETTA Smokey, MD;  Location: [facility];  Service: Urology;  Laterality: N/A;   FRACTURE SURGERY     HERNIA REPAIR     INNER EAR SURGERY Left    ORIF ANKLE FRACTURE Left 09/27/2015   Procedure: OPEN REDUCTION INTERNAL FIXATION (ORIF) ANKLE FRACTURE;  Surgeon: Norleen JINNY Maltos, MD;  Location: [facility];  Service: Orthopedics;  Laterality: Left;   SKIN CANCER EXCISION     TRANSURETHRAL RESECTION OF PROSTATE N/A 03/22/2018   Procedure: TRANSURETHRAL RESECTION OF THE PROSTATE (TURP);  Surgeon: Carolee Sherwood JONETTA Strother, MD;  Location: [facility];  Service: Urology;  Laterality: N/A;   VENTRAL HERNIA REPAIR N/A 01/14/2016   Procedure: HERNIA REPAIR VENTRAL ADULT;  Surgeon: Larinda Unknown Sharps,  MD;  Location: [facility];  Service: General;  Laterality: N/A;     Medications:   Current Facility-Administered Medications:    acetaminophen  (TYLENOL ) tablet 650 mg, 650 mg, Oral, Q6H PRN, Dezii, Alexandra, DO   carbidopa -levodopa  (SINEMET  IR) 25-100 MG per tablet immediate release 1 tablet, 1 tablet, Oral, TID, Dezii, Alexandra, DO, 1 tablet at 03/13/24 1514   carvedilol  (COREG ) tablet 6.25 mg, 6.25 mg, Oral, BID WC, Dezii, Alexandra, DO, 6.25 mg at 03/13/24 9055   cyanocobalamin  (VITAMIN B12) tablet 500 mcg, 500 mcg, Oral, Daily, Dezii, Alexandra, DO, 500 mcg at 03/13/24 9055   diclofenac  Sodium (VOLTAREN ) 1 % topical gel 2 g, 2 g, Topical, QID, Wieting, Richard, MD   divalproex  (DEPAKOTE  ER) 24 hr tablet 500 mg, 500 mg, Oral, Daily, Mirko Tailor, MD, 500 mg at 03/13/24 1305   heparin  injection 5,000 Units, 5,000 Units, Subcutaneous, Q8H, Dezii, Alexandra, DO, 5,000 Units at 03/13/24 1305   HYDROcodone -acetaminophen  (NORCO/VICODIN) 5-325 MG per tablet 1 tablet, 1 tablet, Oral, Q6H PRN, Dezii, Alexandra, DO, 1 tablet at 03/12/24 2109   insulin  aspart (novoLOG ) injection 0-9 Units, 0-9 Units, Subcutaneous, TID WC, Dezii, Alexandra, DO, 2 Units at 03/13/24 1306   LORazepam  (ATIVAN ) injection 2 mg, 2 mg, Intravenous, Q4H PRN, Dezii, Alexandra, DO   morphine  (PF) 2 MG/ML injection 2 mg, 2 mg, Intravenous, Q4H PRN, Dezii, Alexandra, DO   piperacillin -tazobactam (ZOSYN ) IVPB 3.375 g, 3.375 g, Intravenous, Q8H, Wieting, Richard, MD, Last Rate: 12.5 mL/hr at 03/13/24 1305, 3.375 g at 03/13/24 1305   QUEtiapine  (SEROQUEL ) tablet 12.5 mg, 12.5 mg, Oral, Q8H PRN, Dejanira Pamintuan, MD   QUEtiapine  (SEROQUEL ) tablet 25 mg, 25 mg, Oral, QHS, Kameelah Minish, MD, 25 mg at 03/12/24 2105  Allergies: Allergies  Allergen Reactions   Blood-Group Specific Substance    Haloperidol Other (See Comments)    Unknown   Sulfa Antibiotics Other (See Comments)    Unknown    Allyn Foil, MD

## 2024-03-13 NOTE — Progress Notes (Signed)
 Progress Note   Patient: Rick [name] FMW:[id number] DOB: [birth year] DOA: 03/10/2024     3 DOS: the patient was seen and examined on 03/13/2024   Brief hospital course: 83 y.o. male with medical history significant of medication induced parkinsonism on carbidopa  levodopa , hypertension, diabetes.  Patient is acutely confused and very hard of hearing.  The following information was obtained from chart review and ER report: The patient was found at [facility] today and police were called due to patient reportedly demonstrating bizarre behaviors.  Patient then got in car and attempted to drive and was stopped by 088.  While waiting in police car patient became short of breath which police report may have been secondary to heat.  EMS was called.  On arrival to the ED patient was agitated and confused.  He required multiple rounds of Versed .  He remains encephalopathic and requesting to discharge home.  ED physician placed IVC for patient. Labs are mostly unremarkable.  UA consistent with possible UTI.  Patient has been started on ceftriaxone . Reported rhythm on arrival is A-fib RVR.  Presently he is tremulous with significant artifact, but appears to be NSR in the 80s   When asking patient how he got here he has difficulty answering the question and continues to repeat that he would like to go home.   7/30.  Patient asking to go home.  Patient under involuntary commitment.  Brought in with acute metabolic encephalopathy.  ESBL E. coli growing out of urine culture.  Antibiotics switched over to Zosyn .    Assessment and Plan: Acute delirium Secondary to urinary infection.  Patient on Seroquel .  Psychiatry team following.  Patient still under IVC commitment.  Urinary tract infection due to extended-spectrum beta lactamase (ESBL) producing Escherichia coli Rocephin  switched over to Zosyn  on 7/30.  Patient denies that he even has a urinary tract infection.  AKI (acute kidney injury) (HCC) Proved  from 1.32 down to 0.87.  History of bipolar disorder Psychiatry following  Paroxysmal atrial fibrillation (HCC) Started on Coreg .  With his altered mental status they held off on any anticoagulation.   Parkinsonism (HCC) Continue Sinemet   Hard of hearing Have to yell in his ear in order for him to hear.  Controlled type 2 diabetes mellitus without complication, without long-term current use of insulin  (HCC) Hemoglobin A1c 7.0.      Subjective: Patient yelling at me that he wants to go home.  Hard to get into words edgewise.  Patient very difficult with hearing and I have to yell in his ear.  Patient tried to kick me.  Physical Exam: Vitals:   03/12/24 1502 03/12/24 2107 03/13/24 0432 03/13/24 0823  BP: 118/75 101/81 97/79 118/72  Pulse: 94 94 97 90  Resp: 17 17 18 16   Temp: 98.7 F (37.1 C) (!) 100.7 F (38.2 C) 99.1 F (37.3 C) 97.8 F (36.6 C)  TempSrc: Oral Oral Oral Oral  SpO2: 98% 95% 95% 96%  Weight:      Height:       Physical Exam HENT:     Head: Normocephalic.  Eyes:     General: Lids are normal.     Conjunctiva/sclera: Conjunctivae normal.  Cardiovascular:     Rate and Rhythm: Normal rate. Rhythm irregularly irregular.     Heart sounds: Normal heart sounds, S1 normal and S2 normal.  Pulmonary:     Breath sounds: No decreased breath sounds, wheezing, rhonchi or rales.  Abdominal:     Palpations: Abdomen is  soft.     Tenderness: There is no abdominal tenderness.  Musculoskeletal:     Right lower leg: No swelling.     Left lower leg: No swelling.  Skin:    General: Skin is warm.     Findings: No rash.  Neurological:     Mental Status: He is alert.     Comments: Patient tried to kick me.     Data Reviewed: ESBL E. coli growing out of urine culture, creatinine 0.87, white blood count 6.6, hemoglobin 13.3, platelet count 217  Family Communication: Spoke with patient's sister on the phone  Disposition: Status is: Inpatient Remains inpatient  appropriate because: With ESBL E. coli growing out of urine culture antibiotics changed to IV Zosyn  yesterday.  Still under IVC commitment.  Planned Discharge Destination: To be determined.  Concerned about his living alone.  Patient's sister unable to take him in.    Time spent: 28 minutes  Author: [name], MD 03/13/2024 1:49 PM  For on call review [email or web address].

## 2024-03-14 DIAGNOSIS — E538 Deficiency of other specified B group vitamins: Secondary | ICD-10-CM | POA: Diagnosis not present

## 2024-03-14 DIAGNOSIS — R410 Disorientation, unspecified: Secondary | ICD-10-CM | POA: Diagnosis not present

## 2024-03-14 DIAGNOSIS — G20C Parkinsonism, unspecified: Secondary | ICD-10-CM | POA: Diagnosis not present

## 2024-03-14 DIAGNOSIS — N390 Urinary tract infection, site not specified: Secondary | ICD-10-CM | POA: Diagnosis not present

## 2024-03-14 DIAGNOSIS — F05 Delirium due to known physiological condition: Secondary | ICD-10-CM | POA: Diagnosis not present

## 2024-03-14 DIAGNOSIS — E871 Hypo-osmolality and hyponatremia: Secondary | ICD-10-CM

## 2024-03-14 DIAGNOSIS — N179 Acute kidney failure, unspecified: Secondary | ICD-10-CM | POA: Diagnosis not present

## 2024-03-14 LAB — GLUCOSE, CAPILLARY
Glucose-Capillary: 158 mg/dL — ABNORMAL HIGH (ref 70–99)
Glucose-Capillary: 237 mg/dL — ABNORMAL HIGH (ref 70–99)
Glucose-Capillary: 132 mg/dL — ABNORMAL HIGH (ref 70–99)
Glucose-Capillary: 171 mg/dL — ABNORMAL HIGH (ref 70–99)

## 2024-03-14 LAB — COMPREHENSIVE METABOLIC PANEL WITH GFR
ALT: 5 U/L (ref 0–44)
AST: 17 U/L (ref 15–41)
Albumin: 2.8 g/dL — ABNORMAL LOW (ref 3.5–5.0)
Alkaline Phosphatase: 55 U/L (ref 38–126)
Anion gap: 12 (ref 5–15)
BUN: 22 mg/dL (ref 8–23)
CO2: 20 mmol/L — ABNORMAL LOW (ref 22–32)
Calcium: 8.7 mg/dL — ABNORMAL LOW (ref 8.9–10.3)
Chloride: 102 mmol/L (ref 98–111)
Creatinine, Ser: 0.9 mg/dL (ref 0.61–1.24)
GFR, Estimated: >60 mL/min (ref >60)
Glucose, Bld: 177 mg/dL — ABNORMAL HIGH (ref 70–99)
Potassium: 4 mmol/L (ref 3.5–5.1)
Sodium: 134 mmol/L — ABNORMAL LOW (ref 135–145)
Total Bilirubin: 0.8 mg/dL (ref 0.0–1.2)
Total Protein: 6.8 g/dL (ref 6.5–8.1)

## 2024-03-14 LAB — CBC WITH DIFFERENTIAL/PLATELET
Abs Immature Granulocytes: 0.08 K/uL — ABNORMAL HIGH (ref 0.00–0.07)
Basophils Absolute: 0 K/uL (ref 0.0–0.1)
Basophils Relative: 0 %
Eosinophils Absolute: 0 K/uL (ref 0.0–0.5)
Eosinophils Relative: 0 %
HCT: 41.3 % (ref 39.0–52.0)
Hemoglobin: 13.4 g/dL (ref 13.0–17.0)
Immature Granulocytes: 1 %
Lymphocytes Relative: 20 %
Lymphs Abs: 1.6 K/uL (ref 0.7–4.0)
MCH: 28.1 pg (ref 26.0–34.0)
MCHC: 32.4 g/dL (ref 30.0–36.0)
MCV: 86.6 fL (ref 80.0–100.0)
Monocytes Absolute: 1.1 K/uL — ABNORMAL HIGH (ref 0.1–1.0)
Monocytes Relative: 14 %
Neutro Abs: 5.2 K/uL (ref 1.7–7.7)
Neutrophils Relative %: 65 %
Platelets: 204 K/uL (ref 150–400)
RBC: 4.77 MIL/uL (ref 4.22–5.81)
RDW: 14.1 % (ref 11.5–15.5)
WBC: 8 K/uL (ref 4.0–10.5)
nRBC: 0 % (ref 0.0–0.2)

## 2024-03-14 LAB — MAGNESIUM: Magnesium: 1.7 mg/dL (ref 1.7–2.4)

## 2024-03-14 LAB — VITAMIN B1: Vitamin B1 (Thiamine): 232.4 nmol/L — ABNORMAL HIGH (ref 66.5–200.0)

## 2024-03-14 LAB — PHOSPHORUS: Phosphorus: 2.6 mg/dL (ref 2.5–4.6)

## 2024-03-14 MED ORDER — METOPROLOL SUCCINATE ER 25 MG PO TB24
25.0000 mg | ORAL_TABLET | Freq: Every day | ORAL | Status: DC
  Administered 2024-03-14 – 2024-03-15 (×2): 25 mg via ORAL
  Filled 2024-03-14 (×2): qty 1

## 2024-03-14 MED ORDER — VITAMIN B-12 1000 MCG PO TABS
1000.0000 ug | ORAL_TABLET | Freq: Every day | ORAL | Status: DC
  Administered 2024-03-15 – 2024-03-19 (×5): 1000 ug via ORAL
  Filled 2024-03-14 (×5): qty 1

## 2024-03-14 NOTE — Assessment & Plan Note (Signed)
 Sodium 1 point less than the normal range.

## 2024-03-14 NOTE — Progress Notes (Signed)
 Progress Note   Patient: Rick [name] FMW:[id number] DOB: 1941/02/20 DOA: 03/10/2024     4 DOS: the patient was seen and examined on 03/14/2024   Brief hospital course: 83 y.o. male with medical history significant of medication induced parkinsonism on carbidopa  levodopa , hypertension, diabetes.  Patient is acutely confused and very hard of hearing.  The following information was obtained from chart review and ER report: The patient was found at [facility] today and police were called due to patient reportedly demonstrating bizarre behaviors.  Patient then got in car and attempted to drive and was stopped by 088.  While waiting in police car patient became short of breath which police report may have been secondary to heat.  EMS was called.  On arrival to the ED patient was agitated and confused.  He required multiple rounds of Versed .  He remains encephalopathic and requesting to discharge home.  ED physician placed IVC for patient. Labs are mostly unremarkable.  UA consistent with possible UTI.  Patient has been started on ceftriaxone . Reported rhythm on arrival is A-fib RVR.  Presently he is tremulous with significant artifact, but appears to be NSR in the 80s   When asking patient how he got here he has difficulty answering the question and continues to repeat that he would like to go home.   7/30.  Patient asking to go home.  Patient under involuntary commitment.  Brought in with acute metabolic encephalopathy.  ESBL E. coli growing out of urine culture.  Antibiotics switched over to Zosyn . 7/31.  Continue Zosyn .  Patient tried to kick me because he was agitated that I would not let him go home 8/1.  Continue Zosyn .  Patient calmer today and able to have conversation.     Assessment and Plan: Acute delirium Secondary to urinary infection.  Patient on Seroquel  and Depakote .  Psychiatry team following.  Patient still under IVC commitment.  Patient seems calmer today than  yesterday.  Urinary tract infection due to extended-spectrum beta lactamase (ESBL) producing Escherichia coli Rocephin  switched over to Zosyn  on 7/30.  Patient denies that he even has a urinary tract infection.  Continue Zosyn  while here.  Likely will give a dose of fosfomycin prior to discharge.  Paroxysmal atrial fibrillation (HCC) Started on Coreg .  With his altered mental status they held off on any anticoagulation.  When trying to talk to him about anticoagulation he is not understanding the secondary to his hearing loss.  Hesitant on starting anticoagulation if I cannot communicate with him well.  AKI (acute kidney injury) (HCC) Proved from 1.32 down to 0.90.  Vitamin B12 deficiency Supplementing B12  Hard of hearing Have to yell in his ear in order for him to hear.  Controlled type 2 diabetes mellitus without complication, without long-term current use of insulin  (HCC) Hemoglobin A1c 7.0.  Patient on sliding scale insulin  here.  History of bipolar disorder Psychiatry following.  Patient on Seroquel  and Depakote .  Parkinsonism (HCC) Continue Sinemet   Hyponatremia Sodium 1 point less than the normal range.     Subjective: Patient calmer today and now we are able to have a conversation.  He was trying to reason with me to go home.  Less yelling today.  Admitted with acute metabolic encephalopathy secondary to ESBL E. coli  Physical Exam: Vitals:   03/13/24 1934 03/14/24 0413 03/14/24 0801 03/14/24 1407  BP: 106/70 101/67 114/71 97/65  Pulse: 90 90 (!) 107 98  Resp: 17 17 18 16   Temp: 99  F (37.2 C) 97.6 F (36.4 C) 98.3 F (36.8 C) 98.3 F (36.8 C)  TempSrc: Oral Oral    SpO2: 96% 96% 100% 95%  Weight:      Height:       Physical Exam HENT:     Head: Normocephalic.  Eyes:     General: Lids are normal.     Conjunctiva/sclera: Conjunctivae normal.  Cardiovascular:     Rate and Rhythm: Normal rate. Rhythm irregularly irregular.     Heart sounds: Normal heart  sounds, S1 normal and S2 normal.  Pulmonary:     Breath sounds: No decreased breath sounds, wheezing, rhonchi or rales.  Abdominal:     Palpations: Abdomen is soft.     Tenderness: There is no abdominal tenderness.  Musculoskeletal:     Right lower leg: No swelling.     Left lower leg: No swelling.  Skin:    General: Skin is warm.     Findings: No rash.  Neurological:     Mental Status: He is alert.     Comments: Patient is still hard of hearing.  Patient trying to reason with me to go home rather than getting agitated like he did yesterday.     Data Reviewed: Sodium 134, creatinine 0.9, white blood cell count 8.0, hemoglobin 13.4, platelet count 204  Family Communication: Spoke with sister on the phone  Disposition: Status is: Inpatient Remains inpatient appropriate because: Still under IVC commitment.  Patient calmer today and able to talk with me better today.  Planned Discharge Destination: Home with Home Health    Time spent: 28 minutes  Author: [name], MD 03/14/2024 4:08 PM  For on call review [email or web address].

## 2024-03-14 NOTE — Consult Note (Incomplete)
 [facility] Psychiatric Consult Follow up  Patient Name: .Rick [name]  MRN: [id number]  DOB: 08/06/1941  Consult Order details:  Orders (From admission, onward)     Start     Ordered   03/10/24 1414  IP CONSULT TO PSYCHIATRY       Ordering Provider: Dicky Anes, MD  Provider:  (Not yet assigned)  Question Answer Comment  Place call to: psych md   Reason for Consult Consult   Diagnosis/Clinical Info for Consult: mental capacity, altered sensorium, IVC      03/10/24 1413             Mode of Visit: In person    Psychiatry Consult Evaluation  Service Date: March 13, 2024 LOS:  LOS: 3 days  Chief Complaint: consult for capacity, IVC status   Primary Psychiatric Diagnoses  Delirium due to multiple etiologies-UTI parkinsonism on Sinemet    Assessment  Rick [name] is a 83 y.o. male admitted: Medically      The patient is an 83 y.o. male with medical history significant of  parkinsonism on carbidopa  levodopa , hypertension, diabetes. Patient is acutely confused and very hard of hearing.  The following information was obtained from chart review and ER report: The patient was found at [facility] today and police were called due to patient reportedly demonstrating bizarre behaviors.  Patient then got in car and attempted to drive and was stopped by 088.  While waiting in police car patient became short of breath which police report may have been secondary to heat.  EMS was called.  On arrival to the ED patient was agitated and confused.  He required multiple rounds of Versed .  He remains encephalopathic and requesting to discharge home.  ED physician placed IVC for patient.Labs are mostly unremarkable.  UA consistent with possible UTI.  Patient has been started on ceftriaxone . Reported rhythm on arrival is A-fib RVR.  Presently he is tremulous with significant artifact, but appears to be NSR in the 80s. When asking patient how he got here he has difficulty answering the question and  continues to repeat that he would like to go home.  No further review of systems or medication history was able to be obtained.       Psychiatry consulted for assessment of capacity and IVC status.  03/13/24:  Diagnoses:  Active Hospital problems: Active Problems:   Controlled type 2 diabetes mellitus without complication, without long-term current use of insulin  (HCC)   Hard of hearing   Urinary tract infection due to extended-spectrum beta lactamase (ESBL) producing Escherichia coli   Acute delirium   Parkinsonism (HCC)   Paroxysmal atrial fibrillation (HCC)   History of bipolar disorder   AKI (acute kidney injury) (HCC)    Plan   ## Psychiatric Medication Recommendations:  Patient continues to exhibit s/sx of delirium. Potential causes include infection (UTI), B12 deficiency, medication induced (Sinemet  appears to have been increased about 6 months ago). IVC will be maintained at this time, and we will reach out to family to better understand patient's baseline.  -Continue Ativan  prn for agitation; allergy to Haldol  -Continue Sinemet  as prescribed -Strict delirium precautions  -Will consider adding an anti-psychotic to stabilize mood (Seroquel -25 mg nightly and as needed 12.5 mg every 8 hours as needed agitation will also consider Depakote  -UTI treatment with Ceftriaxone  -B12 deficiency as potential cause for acute delirium? Decreased albumin, decreased total protein, and patient's appearance consistent with deficiency due to insufficient intake. Consider replacement with IM  injection or PO therapy   ## Medical Decision Making Capacity: Patient's mental status improving and is awake, alert, oriented x 3.  He is agreeable to the treatment and medications  ## Further Work-up:   -- most recent EKG on 03/10/24 had QtC of 445 ms -- Pertinent labwork reviewed earlier this admission includes: hyperglycemia with A1C of 7%, decreased albumin of 2.6, total protein 5.6, vitamin B12  deficiency of 137, Hb 12.3, Hct 37.4   ## Disposition:-- Plan Post Discharge/Psychiatric Care Follow-up resources will continue to follow-up until we understand his baseline.  No psychiatric indication for inpatient hospitalization at this time.  Will manage the behaviors on the medical floor  ## Behavioral / Environmental: -Delirium Precautions: Delirium Interventions for Nursing and Staff: - RN to open blinds every AM. - To Bedside: Glasses, hearing aide, and pt's own shoes. Make available to patients. when possible and encourage use. - Encourage po fluids when appropriate, keep fluids within reach. - OOB to chair with meals. - Passive ROM exercises to all extremities with AM & PM care. - RN to assess orientation to person, time and place QAM and PRN. - Recommend extended visitation hours with familiar family/friends as feasible. - Staff to minimize disturbances at night. Turn off television when pt asleep or when not in use., To minimize splitting of staff, assign one staff person to communicate all information from the team when feasible., or Utilize compassion and acknowledge the patient's experiences while setting clear and realistic expectations for care.    ## Safety and Observation Level:  - Based on my clinical evaluation, I estimate the patient to be at low risk of self harm in the current setting. - At this time, we recommend  routine. This decision is based on my review of the chart including patient's history and current presentation, interview of the patient, mental status examination, and consideration of suicide risk including evaluating suicidal ideation, plan, intent, suicidal or self-harm behaviors, risk factors, and protective factors. This judgment is based on our ability to directly address suicide risk, implement suicide prevention strategies, and develop a safety plan while the patient is in the clinical setting. Please contact our team if there is a concern that risk level has  changed.  CSSR Risk Category:C-SSRS RISK CATEGORY: No Risk  Suicide Risk Assessment: Patient has following modifiable risk factors for suicide: social isolation, which we are addressing by monitoring behaviors on unit and collaborating with family members. Patient has following non-modifiable or demographic risk factors for suicide: male gender Patient has the following protective factors against suicide: Supportive family  Thank you for this consult request. Recommendations have been communicated to the primary team.  We will continue to monitor at this time.   Allyn Foil, MD       History of Present Illness  Relevant Aspects of Hospital   03/13/24:  Collateral information:  Called sister listed in the chart who informed patient having daughter and son    Psychiatric and Social History  Psychiatric History:  Information collected from patient, chart review.  Prev Dx/Sx: Medication-induced parkinsonism on sinemet  IR, HTN, DM2, BPH. Does have diagnosis of anxiety, bipolar disorder, and depression in chart, but unable to corroborate with patient. Current Psych Provider: None noted on chart review Home Meds (current): Carbidopa -levodopa  25-100 mg, Carvedilol , gabapentin  600 mg PO bid, Metformin , Trazodone  100 mg, others as seen on med list Previous Med Trials: Chart review shows hx of Remeron , Lamictal. Unable to ask Therapy: Unable to ask  Prior  Psych Hospitalization: Unable to ask  Prior Self Harm: Unable to ask Prior Violence: Unable to ask  Family Psych History: Unable to ask, but none seen on chart review Family Hx suicide: Unable to ask, but none seen on chart review  Social History:   Educational Hx: Unable to ask Occupational Hx: Unable to ask Legal Hx: Unable to ask Living Situation: Lives in [place] by himself Spiritual Hx: Unable to ask Access to weapons/lethal means: Unable to ask   Substance History Alcohol: Denies use  History of alcohol withdrawal  seizures: n/a History of DT's: n/a Tobacco: Former smoker, quit in 2018 Illicit drugs: Denies Prescription drug abuse: Denies  Exam Findings  Physical Exam: Patient laying comfortably, NAD and eating soft diet. Appears frail, right leg tremor seen. Breathing is non-labored, and patient able to talk. Is alert ,oriented to self, location and situation. Hard of hearing and does not consistently answer questions. Attention, concentration, and orientation are all improving at this time.   Vital Signs:  Temp:  [97.8 F (36.6 C)-100.7 F (38.2 C)] 99.5 F (37.5 C) (07/31 1514) Pulse Rate:  [90-99] 99 (07/31 1514) Resp:  [16-18] 18 (07/31 1514) BP: (97-118)/(67-81) 100/67 (07/31 1514) SpO2:  [95 %-96 %] 95 % (07/31 1514) Blood pressure 100/67, pulse 99, temperature 99.5 F (37.5 C), temperature source Oral, resp. rate 18, height 5' 4 (1.626 m), weight 69.9 kg, SpO2 95%. Body mass index is 26.47 kg/m.    Mental Status Exam: General Appearance: stated age  Orientation:  Other:  self and home address  Memory:  Immediate;   Poor  Concentration:  Concentration: Poor, possibly compounded by difficulty hearing  Recall:  Poor  Attention  Poor  Eye Contact:  fair  Speech:  Pressured  Language:  Good  Volume:  Increased  Mood: fine  Affect:  animated  Thought Process:  linear  Thought Content:  Illogical at times  Suicidal Thoughts:  not endorsing  Homicidal Thoughts:  not endorsing  Judgement:  Impaired  Insight:  Lacking  Psychomotor Activity:  Tremor  Akathisia:  No  Fund of Knowledge:  Poor      Assets:  Housing Social Support  Cognition:  Impaired,  Moderate  ADL's:  Impaired  AIMS (if indicated):       Other History   These have been pulled in through the EMR, reviewed, and updated if appropriate.  Family History:  The patient's family history includes Cancer in his brother; Diabetes in his father; Transient ischemic attack in his mother.  Medical History: Past  Medical History:  Diagnosis Date  . Anxiety   . Bipolar disorder (HCC)   . Bladder cancer (HCC)   . BPH (benign prostatic hypertrophy)   . Chest pain, atypical 06/23/2015  . Closed trimalleolar fracture of left ankle 10/01/2015  . Cystitis    hx of   . Depression   . Diabetes mellitus without complication (HCC)    type 2   . Difficult or painful urination 10/31/2012  . Dysrhythmia   . Ear problem 03/24/2015  . Gangrenous appendicitis   . H/O urinary disorder 03/27/2013  . Herpes genitalis in men   . Hyperlipidemia   . Hypertension   . Insomnia   . Kidney stones   . Left hand pain 05/18/2015  . Mass of arm 02/10/2015  . Peripheral neuropathy   . Skin cancer   . UD (urethral discharge) 10/31/2012  . Urinary tract infection    hx of     Surgical  History: Past Surgical History:  Procedure Laterality Date  . APPENDECTOMY     RUPTURED  . BLADDER SURGERY    . CHOLECYSTECTOMY    . COLOSTOMY     AND LATER CLOSURE  . CYSTOSCOPY/URETEROSCOPY/HOLMIUM LASER/STENT PLACEMENT Left 03/22/2018   Procedure: CYSTOSCOPY/LEFT URETEROSCOPY/LEFT RETROGRADE PYELOGRAM;  Surgeon: Carolee Sherwood JONETTA Geoff, MD;  Location: [facility];  Service: Urology;  Laterality: Left;  . EPIDIDYMECTOMY N/A 01/03/2019   Procedure: RIGHT EPIDIDYMAL CYST REMOVAL VERSES EPIDIDMECTOMY;  Surgeon: Carolee Sherwood JONETTA Xadrian, MD;  Location: [facility];  Service: Urology;  Laterality: N/A;  . FRACTURE SURGERY    . HERNIA REPAIR    . INNER EAR SURGERY Left   . ORIF ANKLE FRACTURE Left 09/27/2015   Procedure: OPEN REDUCTION INTERNAL FIXATION (ORIF) ANKLE FRACTURE;  Surgeon: Norleen JINNY Maltos, MD;  Location: [facility];  Service: Orthopedics;  Laterality: Left;  . SKIN CANCER EXCISION    . TRANSURETHRAL RESECTION OF PROSTATE N/A 03/22/2018   Procedure: TRANSURETHRAL RESECTION OF THE PROSTATE (TURP);  Surgeon: Carolee Sherwood JONETTA Ashley, MD;  Location: [facility];  Service: Urology;  Laterality: N/A;  . VENTRAL HERNIA REPAIR N/A 01/14/2016   Procedure: HERNIA REPAIR VENTRAL  ADULT;  Surgeon: Larinda Unknown Sharps, MD;  Location: [facility];  Service: General;  Laterality: N/A;     Medications:   Current Facility-Administered Medications:  .  acetaminophen  (TYLENOL ) tablet 650 mg, 650 mg, Oral, Q6H PRN, Dezii, Alexandra, DO .  carbidopa -levodopa  (SINEMET  IR) 25-100 MG per tablet immediate release 1 tablet, 1 tablet, Oral, TID, Dezii, Alexandra, DO, 1 tablet at 03/13/24 1514 .  carvedilol  (COREG ) tablet 6.25 mg, 6.25 mg, Oral, BID WC, Dezii, Alexandra, DO, 6.25 mg at 03/13/24 0944 .  cyanocobalamin  (VITAMIN B12) tablet 500 mcg, 500 mcg, Oral, Daily, Dezii, Alexandra, DO, 500 mcg at 03/13/24 0944 .  diclofenac  Sodium (VOLTAREN ) 1 % topical gel 2 g, 2 g, Topical, QID, Wieting, Richard, MD .  divalproex  (DEPAKOTE  ER) 24 hr tablet 500 mg, 500 mg, Oral, Daily, Olukemi Panchal, MD, 500 mg at 03/13/24 1305 .  heparin  injection 5,000 Units, 5,000 Units, Subcutaneous, Q8H, Dezii, Alexandra, DO, 5,000 Units at 03/13/24 1305 .  HYDROcodone -acetaminophen  (NORCO/VICODIN) 5-325 MG per tablet 1 tablet, 1 tablet, Oral, Q6H PRN, Dezii, Alexandra, DO, 1 tablet at 03/12/24 2109 .  insulin  aspart (novoLOG ) injection 0-9 Units, 0-9 Units, Subcutaneous, TID WC, Dezii, Alexandra, DO, 2 Units at 03/13/24 1306 .  LORazepam  (ATIVAN ) injection 2 mg, 2 mg, Intravenous, Q4H PRN, Dezii, Alexandra, DO .  morphine  (PF) 2 MG/ML injection 2 mg, 2 mg, Intravenous, Q4H PRN, Dezii, Alexandra, DO .  piperacillin -tazobactam (ZOSYN ) IVPB 3.375 g, 3.375 g, Intravenous, Q8H, Wieting, Richard, MD, Last Rate: 12.5 mL/hr at 03/13/24 1305, 3.375 g at 03/13/24 1305 .  QUEtiapine  (SEROQUEL ) tablet 12.5 mg, 12.5 mg, Oral, Q8H PRN, Makhya Arave, MD .  QUEtiapine  (SEROQUEL ) tablet 25 mg, 25 mg, Oral, QHS, Rivan Siordia, MD, 25 mg at 03/12/24 2105  Allergies: Allergies  Allergen Reactions  . Blood-Group Specific Substance   . Haloperidol Other (See Comments)    Unknown  . Sulfa Antibiotics Other (See Comments)     Unknown    Allyn Foil, MD

## 2024-03-14 NOTE — Assessment & Plan Note (Addendum)
 B12 level 137.  Supplementing oral B12.  Patient agreeable to B12 injection on 8/3.

## 2024-03-14 NOTE — Progress Notes (Signed)
 Occupational Therapy Treatment Patient Details Name: Rick [name] MRN: [id number] DOB: Jan 07, 1941 Today's Date: 03/14/2024   History of present illness Pt is an 83 y.o. male admitted with AMS. Per chart, pt found at [facility] with bizzare behaviors and police were called, brought to [facility] ED due to EOB. PMH significant for parkinsonism on carbidopa  levodopa , HTN, diabetes. Patient is acutely confused and very hard of hearing.   OT comments  Pt received with sitter present. Pt intermittently agitated, and participates very minimally in session. Anticipate pt is able to perform tasks physically but ultimately chooses not to. With max encouragement and multimodal cuing, pt begins transitioning towards EOB, demanding physical assist and stopping initiation of movement to close eyes, refusing to attempt again. MAX A to doff and don gown bed level. OT will follow acutely to continue to progress towards functional gains, with continued assessment of appropriateness to participated in skilled services due to mentation and behaviors. Will require 24/7 supervision and support at discharge.       If plan is discharge home, recommend the following:  A lot of help with walking and/or transfers;A lot of help with bathing/dressing/bathroom;Assistance with cooking/housework;Direct supervision/assist for medications management;Assist for transportation;Supervision due to cognitive status;Help with stairs or ramp for entrance;Direct supervision/assist for financial management   Equipment Recommendations  None recommended by OT    Recommendations for Other Services      Precautions / Restrictions Precautions Precautions: Fall Restrictions Weight Bearing Restrictions Per Provider Order: No       Mobility Bed Mobility Overal bed mobility: Needs Assistance Bed Mobility: Rolling           General bed mobility comments: pt demanding physical assist, then refusing to perform. pt rolls and begins  initiation of bed mobility to transition to a seated position, gets halfway there before refusing to further participate. yells in outbursts    Transfers Overall transfer level: Needs assistance                 General transfer comment: NT     Balance Overall balance assessment: Needs assistance     Sitting balance - Comments: NT                                   ADL either performed or assessed with clinical judgement   ADL Overall ADL's : Needs assistance/impaired                 Upper Body Dressing : Bed level;Maximal assistance Upper Body Dressing Details (indicate cue type and reason): maxA to doff and don clean gown. pt non-participatory, seemingly due to behavior issues vs physical ability. Pt does not seem to understand and process.                   General ADL Comments: Pt easily moves around in bed, but agitation increases when pt prompted to perform any BADLs     Communication Communication Communication: Impaired Factors Affecting Communication: Hearing impaired;Other (comment) (extremely HOH)   Cognition Arousal: Lethargic Behavior During Therapy: Agitated, Flat affect Cognition: Cognition impaired   Orientation impairments: Place, Time, Situation Awareness: Intellectual awareness impaired Memory impairment (select all impairments): Short-term memory, Working [employer] fast streamer, Non-declarative long-term memory, Geneticist, molecular long-term memory Attention impairment (select first level of impairment): Focused attention Executive functioning impairment (select all impairments): Initiation, Organization, Sequencing, Reasoning, Problem solving OT - Cognition Comments: Pt extremely HOH. Outbursts of yelling  when he does not want to do something.                 Following commands: Impaired Following commands impaired: Follows one step commands inconsistently      Cueing   Cueing Techniques: Verbal cues, Gestural cues, Tactile cues,  Visual cues        General Comments Sitter present throughout session.    Pertinent Vitals/ Pain       Pain Assessment Pain Assessment: PAINAD Breathing: normal Negative Vocalization: occasional moan/groan, low speech, negative/disapproving quality Facial Expression: smiling or inexpressive Body Language: relaxed Consolability: no need to console PAINAD Score: 1 Pain Intervention(s): Limited activity within patient's tolerance, Repositioned, Other (comment) (does not appear to be in pain, outbursts seem to be behavioral)   Frequency  Min 2X/week        Progress Toward Goals  OT Goals(current goals can now be found in the care plan section)  Progress towards OT goals: Progressing toward goals;OT to reassess next treatment  Acute Rehab OT Goals OT Goal Formulation: Patient unable to participate in goal setting Time For Goal Achievement: 03/26/24 Potential to Achieve Goals: Fair ADL Goals Pt Will Perform Grooming: with modified independence;standing Pt Will Perform Upper Body Dressing: with modified independence;sitting Pt Will Perform Lower Body Dressing: with modified independence;sitting/lateral leans;sit to/from stand Pt Will Transfer to Toilet: with modified independence;ambulating;grab bars Pt Will Perform Toileting - Clothing Manipulation and hygiene: with modified independence;sit to/from stand;sitting/lateral leans  Plan         AM-PAC OT 6 Clicks Daily Activity     Outcome Measure   Help from another person eating meals?: None Help from another person taking care of personal grooming?: A Little Help from another person toileting, which includes using toliet, bedpan, or urinal?: A Lot Help from another person bathing (including washing, rinsing, drying)?: A Lot Help from another person to put on and taking off regular upper body clothing?: A Lot Help from another person to put on and taking off regular lower body clothing?: A Lot 6 Click Score: 15    End  of Session    OT Visit Diagnosis: Unsteadiness on feet (R26.81);Other abnormalities of gait and mobility (R26.89);Other symptoms and signs involving cognitive function   Activity Tolerance Treatment limited secondary to agitation   Patient Left in bed;with call bell/phone within reach;with bed alarm set;with nursing/sitter in room   Nurse Communication Mobility status        Time: 1340-1355 OT Time Calculation (min): 15 min  Charges: OT General Charges $OT Visit: 1 Visit OT Treatments $Self Care/Home Management : 8-22 mins  Sherina Stammer L. Venesa Semidey, OTR/L  03/14/24, 2:08 PM

## 2024-03-14 NOTE — Consult Note (Signed)
 [facility] Psychiatric Consult Follow up  Patient Name: .Rick [name]  MRN: [id number]  DOB: [birth year]  Consult Order details:  Orders (From admission, onward)     Start     Ordered   03/10/24 1414  IP CONSULT TO PSYCHIATRY       Ordering Provider: Dicky Anes, MD  Provider:  (Not yet assigned)  Question Answer Comment  Place call to: psych md   Reason for Consult Consult   Diagnosis/Clinical Info for Consult: mental capacity, altered sensorium, IVC      03/10/24 1413             Mode of Visit: In person    Psychiatry Consult Evaluation  Service Date: March 14, 2024 LOS:  LOS: 4 days  Chief Complaint: consult for capacity, IVC status   Primary Psychiatric Diagnoses  Delirium due to multiple etiologies-UTI parkinsonism on Sinemet    Assessment  Rick [name] is a 83 y.o. male admitted: Medically      The patient is an 83 y.o. male with medical history significant of  parkinsonism on carbidopa  levodopa , hypertension, diabetes. Patient is acutely confused and very hard of hearing.  The following information was obtained from chart review and ER report: The patient was found at [facility] today and police were called due to patient reportedly demonstrating bizarre behaviors.  Patient then got in car and attempted to drive and was stopped by 088.  While waiting in police car patient became short of breath which police report may have been secondary to heat.  EMS was called.  On arrival to the ED patient was agitated and confused.  He required multiple rounds of Versed .  He remains encephalopathic and requesting to discharge home.  ED physician placed IVC for patient.Labs are mostly unremarkable.  UA consistent with possible UTI.  Patient has been started on ceftriaxone . Reported rhythm on arrival is A-fib RVR.  Presently he is tremulous with significant artifact, but appears to be NSR in the 80s. When asking patient how he got here he has difficulty answering the question  and continues to repeat that he would like to go home.  No further review of systems or medication history was able to be obtained.       Psychiatry consulted for assessment of capacity and IVC status. On chart review in Care Everywhere there is documentation since 2019 from neurology about his diagnosis of Parkinson's in the context of tremors and memory problems and there was also mention of possible dementia which is also reflected with the brain imaging that shows diffuse cerebral atrophy appropriate for age.  Currently patient is not engaging in any cognitive assessment.  Will maintain the IVC at this point and continue to reach out to family members for surrogate decision making.  03/14/24: Main patient's mental status is improving with the current medication regimen of Depakote  and Seroquel .  Will continue to monitor the situation.  Will continue to evaluate the need for IVC on an ongoing basis as patient's mental status is improving  Diagnoses:  Active Hospital problems: Active Problems:   Controlled type 2 diabetes mellitus without complication, without long-term current use of insulin  (HCC)   Hard of hearing   Urinary tract infection due to extended-spectrum beta lactamase (ESBL) producing Escherichia coli   Acute delirium   Parkinsonism (HCC)   Paroxysmal atrial fibrillation (HCC)   History of bipolar disorder   AKI (acute kidney injury) (HCC)    Plan   ## Psychiatric Medication Recommendations:  Patient continues to exhibit s/sx of delirium. Potential causes include infection (UTI), B12 deficiency, medication induced (Sinemet  appears to have been increased about 6 months ago). IVC will be maintained at this time, and we will reach out to family to better understand patient's baseline.  -Continue Ativan  prn for agitation; allergy to Haldol  -Continue Sinemet  as prescribed -Strict delirium precautions  -Will consider adding an anti-psychotic to stabilize mood (Seroquel -25 mg nightly  and as needed 12.5 mg every 8 hours as needed agitation Added Depakote  ER 500 mg daily -UTI treatment with Ceftriaxone  -B12 deficiency as potential cause for acute delirium? Decreased albumin, decreased total protein, and patient's appearance consistent with deficiency due to insufficient intake. Consider replacement with IM injection or PO therapy    ## Further Work-up:   -- most recent EKG on 03/10/24 had QtC of 445 ms -- Pertinent labwork reviewed earlier this admission includes: hyperglycemia with A1C of 7%, decreased albumin of 2.6, total protein 5.6, vitamin B12 deficiency of 137, Hb 12.3, Hct 37.4   ## Disposition:-- Plan Post Discharge/Psychiatric Care Follow-up resources will continue to follow-up until we understand his baseline.  No psychiatric indication for inpatient hospitalization at this time.  Will manage the behaviors on the medical floor  ## Behavioral / Environmental: -Delirium Precautions: Delirium Interventions for Nursing and Staff: - RN to open blinds every AM. - To Bedside: Glasses, hearing aide, and pt's own shoes. Make available to patients. when possible and encourage use. - Encourage po fluids when appropriate, keep fluids within reach. - OOB to chair with meals. - Passive ROM exercises to all extremities with AM & PM care. - RN to assess orientation to person, time and place QAM and PRN. - Recommend extended visitation hours with familiar family/friends as feasible. - Staff to minimize disturbances at night. Turn off television when pt asleep or when not in use., To minimize splitting of staff, assign one staff person to communicate all information from the team when feasible., or Utilize compassion and acknowledge the patient's experiences while setting clear and realistic expectations for care.    ## Safety and Observation Level:  - Based on my clinical evaluation, I estimate the patient to be at low risk of self harm in the current setting. - At this time, we  recommend  routine. This decision is based on my review of the chart including patient's history and current presentation, interview of the patient, mental status examination, and consideration of suicide risk including evaluating suicidal ideation, plan, intent, suicidal or self-harm behaviors, risk factors, and protective factors. This judgment is based on our ability to directly address suicide risk, implement suicide prevention strategies, and develop a safety plan while the patient is in the clinical setting. Please contact our team if there is a concern that risk level has changed.  CSSR Risk Category:C-SSRS RISK CATEGORY: No Risk  Suicide Risk Assessment: Patient has following modifiable risk factors for suicide: social isolation, which we are addressing by monitoring behaviors on unit and collaborating with family members. Patient has following non-modifiable or demographic risk factors for suicide: male gender Patient has the following protective factors against suicide: Supportive family  Thank you for this consult request. Recommendations have been communicated to the primary team.  We will continue to monitor at this time.   Rick Italiano, MD       History of Present Illness  Relevant Aspects of Hospital   03/14/24: To be on interview patient is noted to be resting in bed.  He  states that he moved from bed that he threw up after eating.  Patient is noted to be answering all the questions appropriately.  The portions of the temporal.  He is able to read the questions and give you any advanced orders.  He is able to answer the orientation questions appropriately.  He wanted to go home.  Provider educated him on his urinary infection.  He initially declined agree that he has infection but eventually acknowledged the urinary infection.  He continues to report feeling safe on the unit and is not endorsing any confusion or hallucinations at this time.  He reports taking all his medications.   Patient wants to make sure that he will be discharged home after completing the antibiotics.  Provider assured him that the team is working with his family and everybody will make sure he goes home eventually.    Psychiatric and Social History  Psychiatric History:  Information collected from patient, chart review.  Prev Dx/Sx: Medication-induced parkinsonism on sinemet  IR, HTN, DM2, BPH. Does have diagnosis of anxiety, bipolar disorder, and depression in chart, but unable to corroborate with patient. Current Psych Provider: None noted on chart review Home Meds (current): Carbidopa -levodopa  25-100 mg, Carvedilol , gabapentin  600 mg PO bid, Metformin , Trazodone  100 mg, others as seen on med list Previous Med Trials: Chart review shows hx of Remeron , Lamictal. Unable to ask Therapy: Unable to ask  Prior Psych Hospitalization: Unable to ask  Prior Self Harm: Unable to ask Prior Violence: Unable to ask  Family Psych History: Unable to ask, but none seen on chart review Family Hx suicide: Unable to ask, but none seen on chart review  Social History:   Educational Hx: Unable to ask Occupational Hx: Unable to ask Legal Hx: Unable to ask Living Situation: Lives in [place] by himself Spiritual Hx: Unable to ask Access to weapons/lethal means: Unable to ask   Substance History Alcohol: Denies use  History of alcohol withdrawal seizures: n/a History of DT's: n/a Tobacco: Former smoker, quit in 2018 Illicit drugs: Denies Prescription drug abuse: Denies  Exam Findings  Physical Exam: Patient laying comfortably, NAD and eating soft diet. Appears frail, right leg tremor seen. Breathing is non-labored, and patient able to talk. Is alert ,oriented to self, location and situation. Hard of hearing and does not consistently answer questions. Attention, concentration, and orientation are all improving at this time.   Vital Signs:  Temp:  [97.6 F (36.4 C)-99 F (37.2 C)] 98.3 F (36.8 C)  (08/01 1407) Pulse Rate:  [90-107] 98 (08/01 1407) Resp:  [16-18] 16 (08/01 1407) BP: (97-114)/(65-71) 97/65 (08/01 1407) SpO2:  [95 %-100 %] 95 % (08/01 1407) Blood pressure 97/65, pulse 98, temperature 98.3 F (36.8 C), resp. rate 16, height 5' 4 (1.626 m), weight 69.9 kg, SpO2 95%. Body mass index is 26.47 kg/m.    Mental Status Exam: General Appearance: stated age  Orientation:  Other:  self and home address  Memory:  Immediate;   Poor  Concentration:  Concentration: Poor, possibly compounded by difficulty hearing  Recall:  Poor  Attention  Poor  Eye Contact:  fair  Speech:  Pressured  Language:  Good  Volume:  Increased  Mood: fine  Affect:  animated  Thought Process:  linear  Thought Content:  coherent  Suicidal Thoughts:  not endorsing  Homicidal Thoughts:  not endorsing  Judgement:  Impaired  Insight:  Lacking  Psychomotor Activity:  Tremor  Akathisia:  No  Fund of Knowledge:  Poor  Assets:  Housing Social Support  Cognition:  Impaired,  Moderate  ADL's:  Impaired  AIMS (if indicated):       Other History   These have been pulled in through the EMR, reviewed, and updated if appropriate.  Family History:  The patient's family history includes Cancer in his brother; Diabetes in his father; Transient ischemic attack in his mother.  Medical History: Past Medical History:  Diagnosis Date   Anxiety    Bipolar disorder (HCC)    Bladder cancer (HCC)    BPH (benign prostatic hypertrophy)    Chest pain, atypical 06/23/2015   Closed trimalleolar fracture of left ankle 10/01/2015   Cystitis    hx of    Depression    Diabetes mellitus without complication (HCC)    type 2    Difficult or painful urination 10/31/2012   Dysrhythmia    Ear problem 03/24/2015   Gangrenous appendicitis    H/O urinary disorder 03/27/2013   Herpes genitalis in men    Hyperlipidemia    Hypertension    Insomnia    Kidney stones    Left hand pain 05/18/2015   Mass of arm  02/10/2015   Peripheral neuropathy    Skin cancer    UD (urethral discharge) 10/31/2012   Urinary tract infection    hx of     Surgical History: Past Surgical History:  Procedure Laterality Date   APPENDECTOMY     RUPTURED   BLADDER SURGERY     CHOLECYSTECTOMY     COLOSTOMY     AND LATER CLOSURE   CYSTOSCOPY/URETEROSCOPY/HOLMIUM LASER/STENT PLACEMENT Left 03/22/2018   Procedure: CYSTOSCOPY/LEFT URETEROSCOPY/LEFT RETROGRADE PYELOGRAM;  Surgeon: Carolee Sherwood JONETTA Wissam, MD;  Location: [facility];  Service: Urology;  Laterality: Left;   EPIDIDYMECTOMY N/A 01/03/2019   Procedure: RIGHT EPIDIDYMAL CYST REMOVAL VERSES EPIDIDMECTOMY;  Surgeon: Carolee Sherwood JONETTA Xzavien, MD;  Location: [facility];  Service: Urology;  Laterality: N/A;   FRACTURE SURGERY     HERNIA REPAIR     INNER EAR SURGERY Left    ORIF ANKLE FRACTURE Left 09/27/2015   Procedure: OPEN REDUCTION INTERNAL FIXATION (ORIF) ANKLE FRACTURE;  Surgeon: Norleen JINNY Maltos, MD;  Location: [facility];  Service: Orthopedics;  Laterality: Left;   SKIN CANCER EXCISION     TRANSURETHRAL RESECTION OF PROSTATE N/A 03/22/2018   Procedure: TRANSURETHRAL RESECTION OF THE PROSTATE (TURP);  Surgeon: Carolee Sherwood JONETTA Humzah, MD;  Location: [facility];  Service: Urology;  Laterality: N/A;   VENTRAL HERNIA REPAIR N/A 01/14/2016   Procedure: HERNIA REPAIR VENTRAL ADULT;  Surgeon: Larinda Unknown Sharps, MD;  Location: [facility];  Service: General;  Laterality: N/A;     Medications:   Current Facility-Administered Medications:    acetaminophen  (TYLENOL ) tablet 650 mg, 650 mg, Oral, Q6H PRN, Dezii, Alexandra, DO   carbidopa -levodopa  (SINEMET  IR) 25-100 MG per tablet immediate release 1 tablet, 1 tablet, Oral, TID, Dezii, Alexandra, DO, 1 tablet at 03/14/24 0903   cyanocobalamin  (VITAMIN B12) tablet 500 mcg, 500 mcg, Oral, Daily, Dezii, Alexandra, DO, 500 mcg at 03/14/24 0907   diclofenac  Sodium (VOLTAREN ) 1 % topical gel 2 g, 2 g, Topical, QID, Wieting, Richard, MD   divalproex  (DEPAKOTE  ER)  24 hr tablet 500 mg, 500 mg, Oral, Daily, Kayshawn Ozburn, MD, 500 mg at 03/14/24 0907   heparin  injection 5,000 Units, 5,000 Units, Subcutaneous, Q8H, Dezii, Alexandra, DO, 5,000 Units at 03/14/24 1416   HYDROcodone -acetaminophen  (NORCO/VICODIN) 5-325 MG per tablet 1 tablet, 1 tablet, Oral, Q6H PRN,  Dezii, Alexandra, DO, 1 tablet at 03/12/24 2109   insulin  aspart (novoLOG ) injection 0-9 Units, 0-9 Units, Subcutaneous, TID WC, Dezii, Alexandra, DO, 3 Units at 03/14/24 1144   LORazepam  (ATIVAN ) injection 2 mg, 2 mg, Intravenous, Q4H PRN, Dezii, Alexandra, DO   metoprolol  succinate (TOPROL -XL) 24 hr tablet 25 mg, 25 mg, Oral, Daily, Wieting, Richard, MD, 25 mg at 03/14/24 9092   morphine  (PF) 2 MG/ML injection 2 mg, 2 mg, Intravenous, Q4H PRN, Dezii, Alexandra, DO   piperacillin -tazobactam (ZOSYN ) IVPB 3.375 g, 3.375 g, Intravenous, Q8H, Wieting, Richard, MD, Last Rate: 12.5 mL/hr at 03/14/24 1417, 3.375 g at 03/14/24 1417   QUEtiapine  (SEROQUEL ) tablet 12.5 mg, 12.5 mg, Oral, Q8H PRN, Jordanne Elsbury, MD   QUEtiapine  (SEROQUEL ) tablet 25 mg, 25 mg, Oral, QHS, Ogle Hoeffner, MD, 25 mg at 03/13/24 2119  Allergies: Allergies  Allergen Reactions   Blood-Group Specific Substance    Haloperidol Other (See Comments)    Unknown   Sulfa Antibiotics Other (See Comments)    Unknown    Allyn Foil, MD

## 2024-03-15 DIAGNOSIS — N390 Urinary tract infection, site not specified: Secondary | ICD-10-CM | POA: Diagnosis not present

## 2024-03-15 DIAGNOSIS — R410 Disorientation, unspecified: Secondary | ICD-10-CM | POA: Diagnosis not present

## 2024-03-15 DIAGNOSIS — I480 Paroxysmal atrial fibrillation: Secondary | ICD-10-CM | POA: Diagnosis not present

## 2024-03-15 DIAGNOSIS — N179 Acute kidney failure, unspecified: Secondary | ICD-10-CM | POA: Diagnosis not present

## 2024-03-15 LAB — CBC WITH DIFFERENTIAL/PLATELET
Abs Immature Granulocytes: 0.09 K/uL — ABNORMAL HIGH (ref 0.00–0.07)
Basophils Absolute: 0.1 K/uL (ref 0.0–0.1)
Basophils Relative: 1 %
Eosinophils Absolute: 0 K/uL (ref 0.0–0.5)
Eosinophils Relative: 0 %
HCT: 41.2 % (ref 39.0–52.0)
Hemoglobin: 13.4 g/dL (ref 13.0–17.0)
Immature Granulocytes: 1 %
Lymphocytes Relative: 28 %
Lymphs Abs: 2.2 K/uL (ref 0.7–4.0)
MCH: 27.8 pg (ref 26.0–34.0)
MCHC: 32.5 g/dL (ref 30.0–36.0)
MCV: 85.5 fL (ref 80.0–100.0)
Monocytes Absolute: 1.1 K/uL — ABNORMAL HIGH (ref 0.1–1.0)
Monocytes Relative: 14 %
Neutro Abs: 4.4 K/uL (ref 1.7–7.7)
Neutrophils Relative %: 56 %
Platelets: 213 K/uL (ref 150–400)
RBC: 4.82 MIL/uL (ref 4.22–5.81)
RDW: 14.2 % (ref 11.5–15.5)
WBC: 7.9 K/uL (ref 4.0–10.5)
nRBC: 0 % (ref 0.0–0.2)

## 2024-03-15 LAB — COMPREHENSIVE METABOLIC PANEL WITH GFR
ALT: <5 U/L (ref 0–44)
AST: 17 U/L (ref 15–41)
Albumin: 2.8 g/dL — ABNORMAL LOW (ref 3.5–5.0)
Alkaline Phosphatase: 48 U/L (ref 38–126)
Anion gap: 8 (ref 5–15)
BUN: 24 mg/dL — ABNORMAL HIGH (ref 8–23)
CO2: 24 mmol/L (ref 22–32)
Calcium: 9 mg/dL (ref 8.9–10.3)
Chloride: 102 mmol/L (ref 98–111)
Creatinine, Ser: 0.95 mg/dL (ref 0.61–1.24)
GFR, Estimated: >60 mL/min (ref >60)
Glucose, Bld: 149 mg/dL — ABNORMAL HIGH (ref 70–99)
Potassium: 3.9 mmol/L (ref 3.5–5.1)
Sodium: 134 mmol/L — ABNORMAL LOW (ref 135–145)
Total Bilirubin: 0.6 mg/dL (ref 0.0–1.2)
Total Protein: 6.8 g/dL (ref 6.5–8.1)

## 2024-03-15 LAB — GLUCOSE, CAPILLARY
Glucose-Capillary: 154 mg/dL — ABNORMAL HIGH (ref 70–99)
Glucose-Capillary: 265 mg/dL — ABNORMAL HIGH (ref 70–99)
Glucose-Capillary: 169 mg/dL — ABNORMAL HIGH (ref 70–99)
Glucose-Capillary: 238 mg/dL — ABNORMAL HIGH (ref 70–99)

## 2024-03-15 LAB — PHOSPHORUS: Phosphorus: 3.4 mg/dL (ref 2.5–4.6)

## 2024-03-15 LAB — MAGNESIUM: Magnesium: 1.8 mg/dL (ref 1.7–2.4)

## 2024-03-15 MED ORDER — AMIODARONE HCL 200 MG PO TABS
200.0000 mg | ORAL_TABLET | Freq: Two times a day (BID) | ORAL | Status: DC
  Administered 2024-03-15 – 2024-03-18 (×7): 200 mg via ORAL
  Filled 2024-03-15 (×7): qty 1

## 2024-03-15 NOTE — Consult Note (Signed)
 [facility] Consult Follow-up  Patient Name: .Rick [name]  MRN: [id number]  DOB: 09-26-1940  Consult Order details:  Orders (From admission, onward)     Start     Ordered   03/10/24 1414  IP CONSULT TO PSYCHIATRY       Ordering Provider: Dicky Anes, MD  Provider:  (Not yet assigned)  Question Answer Comment  Place call to: psych md   Reason for Consult Consult   Diagnosis/Clinical Info for Consult: mental capacity, altered sensorium, IVC      03/10/24 1413             Mode of Visit: In person    Psychiatry Consult Evaluation  Service Date: March 15, 2024 LOS:  LOS: 5 days  Chief Complaint follow up consult  Primary Psychiatric Diagnoses  Delirium due to multiple etiologies--UTI Parkinsonism on Sinemet    Assessment  Rick [name] is a 83 y.o. male admitted: Medically    The patient is an 83 y.o. male with medical history significant of  parkinsonism on carbidopa  levodopa , hypertension, diabetes. Patient is acutely confused and very hard of hearing.  The following information was obtained from chart review and ER report: The patient was found at [facility] today and police were called due to patient reportedly demonstrating bizarre behaviors.  Patient then got in car and attempted to drive and was stopped by 088.  While waiting in police car patient became short of breath which police report may have been secondary to heat.  EMS was called.  On arrival to the ED patient was agitated and confused.  He required multiple rounds of Versed .  He remains encephalopathic and requesting to discharge home.  ED physician placed IVC for patient.Labs are mostly unremarkable.  UA consistent with possible UTI.  Patient has been started on ceftriaxone . Reported rhythm on arrival is A-fib RVR.  Presently he is tremulous with significant artifact, but appears to be NSR in the 80s. When asking patient how he got here he has difficulty answering the question and continues to  repeat that he would like to go home.  No further review of systems or medication history was able to be obtained.       Psychiatry consulted for assessment of capacity and IVC status. On chart review in Care Everywhere there is documentation since 2019 from neurology about his diagnosis of Parkinson's in the context of tremors and memory problems and there was also mention of possible dementia which is also reflected with the brain imaging that shows diffuse cerebral atrophy appropriate for age.  Currently patient is not engaging in any cognitive assessment.  Will maintain the IVC at this point and continue to reach out to family members for surrogate decision making.   03/14/24: Main patient's mental status is improving with the current medication regimen of Depakote  and Seroquel .  Will continue to monitor the situation.  Will continue to evaluate the need for IVC on an ongoing basis as patient's mental status is improving  03/15/2024: Patient's mental status is improving. He is alert and oriented x3. He has been compliant with medication administrations. He does request to return home. Sitter at bedside states that he only becomes agitated when he needs to be cleaned. States that he lives alone and use life alert when needed.   Diagnoses:  Active Hospital problems: Active Problems:   Controlled type 2 diabetes mellitus without complication, without long-term current use of insulin  (HCC)   Hard of hearing   Urinary  tract infection due to extended-spectrum beta lactamase (ESBL) producing Escherichia coli   Acute delirium   Parkinsonism (HCC)   Paroxysmal atrial fibrillation (HCC)   History of bipolar disorder   AKI (acute kidney injury) (HCC)   Vitamin B12 deficiency   Hyponatremia    Plan   ## Psychiatric Medication Recommendations:  Will continue with current medications. Delirium symptoms are improving. Seroquel  25 mg nightly and 12. 5 mg as needed every 8 hours for agitation. Depakote   500 mg daily.   ## Medical Decision Making Capacity:  Mental status improving and is awake, alert, oriented x 3.  He has been compliant with treatment and medications. He is able to understand to a degree his current condition however he is does not understand the gravity of cessation of treatment at this time.  Patient lacks capacity to make medical decisions at this time.  IVC will be maintained at this time in the context of ongoing delirium with possible underlying dementia documented in some of the charts by neurology.  Capacity can fluctuate depending on the mental status.   ## Further Work-up:   -- most recent EKG on 03/10/2024 had QtC of 445 -- Pertinent labwork reviewed earlier this admission includes: see previous notes   ## Disposition:-- Plan Post Discharge/Psychiatric Care Follow-up resources will continue to follow-up until we understand his baseline.  No psychiatric indication for inpatient hospitalization at this time.  Will manage the behaviors on the medical floor   ## Behavioral / Environmental: -Delirium Precautions: Delirium Interventions for Nursing and Staff: - RN to open blinds every AM. - To Bedside: Glasses, hearing aide, and pt's own shoes. Make available to patients. when possible and encourage use. - Encourage po fluids when appropriate, keep fluids within reach. - OOB to chair with meals. - Passive ROM exercises to all extremities with AM & PM care. - RN to assess orientation to person, time and place QAM and PRN. - Recommend extended visitation hours with familiar family/friends as feasible. - Staff to minimize disturbances at night. Turn off television when pt asleep or when not in use.    ## Safety and Observation Level:  - Based on my clinical evaluation, I estimate the patient to be at low risk of self harm in the current setting. - At this time, we recommend  routine. This decision is based on my review of the chart including patient's history and current  presentation, interview of the patient, mental status examination, and consideration of suicide risk including evaluating suicidal ideation, plan, intent, suicidal or self-harm behaviors, risk factors, and protective factors. This judgment is based on our ability to directly address suicide risk, implement suicide prevention strategies, and develop a safety plan while the patient is in the clinical setting. Please contact our team if there is a concern that risk level has changed.  CSSR Risk Category:C-SSRS RISK CATEGORY: No Risk  Suicide Risk Assessment: Patient has following modifiable risk factors for suicide: social isolation, which we are addressing by collaborating with family members. Patient has following non-modifiable or demographic risk factors for suicide: male gender Patient has the following protective factors against suicide: Supportive family  Thank you for this consult request. Recommendations have been communicated to the primary team.  We will continue to monitor at this time.   Rick [name] B Rick Kroh, NP       History of Present Illness  Relevant Aspects of [facility]   Patient Report:  03/15/2024: Patient's mental status is improving. He is alert and  oriented x3. He has been compliant with medication administrations. He does request to return home. Sitter at bedside states that he only becomes agitated when he needs to be cleaned. States that he lives alone and use life alert when needed.   Psych ROS:  Depression: unknown Anxiety:  unknown Mania (lifetime and current): unknown Psychosis: (lifetime and current): unknown    Psychiatric and Social History  Psychiatric History:  Information collected from chart and patient  Prev Dx/Sx: unknown Current Psych Provider: unknown Home Meds (current): unknown Previous Med Trials: unknown Therapy: unknown  Prior Psych Hospitalization: unknown  Prior Self Harm: [email or web address] Prior Violence: unknown  Family Psych  History: unknown Family Hx suicide: unknown  Social History:   Educational Hx: unknown Occupational Hx: unknown Legal Hx: unknownunknown Living Situation: unknown Spiritual Hx: unknown Access to weapons/lethal means: unknown   Substance History Alcohol: unknown  Type of alcohol unknown Last Drink unknown Number of drinks per day unknown History of alcohol withdrawal seizures unknown History of DT's unknown Tobacco: unknown Illicit drugs: unknown Prescription drug abuse: unknown Rehab hx: unknown  Exam Findings  Physical Exam: Patient laying comfortably, Easily awakened by calling his name loudly Breathing is non-labored, and patient able to talk. Is alert ,oriented to self, location and situation. Hard of hearing and answers questions appropriately. Attention, concentration, and orientation are all improving at this time.  Vital Signs:  Temp:  [98 F (36.7 C)] 98 F (36.7 C) (08/02 0720) Pulse Rate:  [84-99] 99 (08/02 0720) Resp:  [14-18] 14 (08/02 0720) BP: (88-93)/(56-72) 89/72 (08/02 0720) SpO2:  [92 %-97 %] 97 % (08/02 0720) Blood pressure (!) 89/72, pulse 99, temperature 98 F (36.7 C), temperature source Oral, resp. rate 14, height 5' 4 (1.626 m), weight 69.9 kg, SpO2 97%. Body mass index is 26.47 kg/m.    Mental Status Exam: General Appearance: Disheveled  Orientation:  Full (Time, Place, and Person)  Memory:  Immediate;   Fair Recent;   Fair Remote;   Fair  Concentration:  Concentration: Fair and Attention Span: Fair  Recall:  Fair  Attention  Fair  Eye Contact:  Fair  Speech:  Normal Rate  Language:  Good  Volume:  Increased  Mood: euthymic  Affect:  Appropriate  Thought Process:  Goal Directed  Thought Content:  WDL  Suicidal Thoughts:  No  Homicidal Thoughts:  No  Judgement:  Fair  Insight:  Fair  Psychomotor Activity:  Normal  Akathisia:  No  Fund of Knowledge:  Fair      Assets:  Desire for Improvement  Cognition:  Impaired,  Mild   ADL's:  Impaired  AIMS (if indicated):        Other History   These have been pulled in through the EMR, reviewed, and updated if appropriate.  Family History:  The patient's family history includes Cancer in his brother; Diabetes in his father; Transient ischemic attack in his mother.  Medical History: Past Medical History:  Diagnosis Date   Anxiety    Bipolar disorder [facility])    Bladder cancer (HCC)    BPH (benign prostatic hypertrophy)    Chest pain, atypical 06/23/2015   Closed trimalleolar fracture of left ankle 10/01/2015   Cystitis    hx of    Depression    Diabetes mellitus without complication (HCC)    type 2    Difficult or painful urination 10/31/2012   Dysrhythmia    Ear problem 03/24/2015   Gangrenous appendicitis    H/O urinary disorder  03/27/2013   Herpes genitalis in men    Hyperlipidemia    Hypertension    Insomnia    Kidney stones    Left hand pain 05/18/2015   Mass of arm 02/10/2015   Peripheral neuropathy    Skin cancer    UD (urethral discharge) 10/31/2012   Urinary tract infection    hx of     Surgical History: Past Surgical History:  Procedure Laterality Date   APPENDECTOMY     RUPTURED   BLADDER SURGERY     CHOLECYSTECTOMY     COLOSTOMY     AND LATER CLOSURE   CYSTOSCOPY/URETEROSCOPY/HOLMIUM LASER/STENT PLACEMENT Left 03/22/2018   Procedure: CYSTOSCOPY/LEFT URETEROSCOPY/LEFT RETROGRADE PYELOGRAM;  Surgeon: Carolee Sherwood JONETTA Dameon, MD;  Location: [facility];  Service: Urology;  Laterality: Left;   EPIDIDYMECTOMY N/A 01/03/2019   Procedure: RIGHT EPIDIDYMAL CYST REMOVAL VERSES EPIDIDMECTOMY;  Surgeon: Carolee Sherwood JONETTA Yunis, MD;  Location: [facility];  Service: Urology;  Laterality: N/A;   FRACTURE SURGERY     HERNIA REPAIR     INNER EAR SURGERY Left    ORIF ANKLE FRACTURE Left 09/27/2015   Procedure: OPEN REDUCTION INTERNAL FIXATION (ORIF) ANKLE FRACTURE;  Surgeon: Norleen JINNY Maltos, MD;  Location: [facility];  Service: Orthopedics;  Laterality: Left;   SKIN CANCER  EXCISION     TRANSURETHRAL RESECTION OF PROSTATE N/A 03/22/2018   Procedure: TRANSURETHRAL RESECTION OF THE PROSTATE (TURP);  Surgeon: Carolee Sherwood JONETTA Eliot, MD;  Location: [facility];  Service: Urology;  Laterality: N/A;   VENTRAL HERNIA REPAIR N/A 01/14/2016   Procedure: HERNIA REPAIR VENTRAL ADULT;  Surgeon: Larinda Unknown Sharps, MD;  Location: [facility];  Service: General;  Laterality: N/A;     Medications:   Current Facility-Administered Medications:    acetaminophen  (TYLENOL ) tablet 650 mg, 650 mg, Oral, Q6H PRN, Dezii, Alexandra, DO   amiodarone  (PACERONE ) tablet 200 mg, 200 mg, Oral, BID, Josette, Richard, MD, 200 mg at 03/15/24 1327   carbidopa -levodopa  (SINEMET  IR) 25-100 MG per tablet immediate release 1 tablet, 1 tablet, Oral, TID, Dezii, Alexandra, DO, 1 tablet at 03/15/24 0857   cyanocobalamin  (VITAMIN B12) tablet 1,000 mcg, 1,000 mcg, Oral, Daily, Wieting, Richard, MD, 1,000 mcg at 03/15/24 9142   diclofenac  Sodium (VOLTAREN ) 1 % topical gel 2 g, 2 g, Topical, QID, Wieting, Richard, MD, 2 g at 03/14/24 2112   divalproex  (DEPAKOTE  ER) 24 hr tablet 500 mg, 500 mg, Oral, Daily, Jadapalle, Sree, MD, 500 mg at 03/15/24 0857   heparin  injection 5,000 Units, 5,000 Units, Subcutaneous, Q8H, Dezii, Alexandra, DO, 5,000 Units at 03/15/24 1326   HYDROcodone -acetaminophen  (NORCO/VICODIN) 5-325 MG per tablet 1 tablet, 1 tablet, Oral, Q6H PRN, Dezii, Alexandra, DO, 1 tablet at 03/12/24 2109   insulin  aspart (novoLOG ) injection 0-9 Units, 0-9 Units, Subcutaneous, TID WC, Dezii, Alexandra, DO, 5 Units at 03/15/24 1327   LORazepam  (ATIVAN ) injection 2 mg, 2 mg, Intravenous, Q4H PRN, Dezii, Alexandra, DO   piperacillin -tazobactam (ZOSYN ) IVPB 3.375 g, 3.375 g, Intravenous, Q8H, Wieting, Richard, MD, Last Rate: 12.5 mL/hr at 03/15/24 1328, 3.375 g at 03/15/24 1328   QUEtiapine  (SEROQUEL ) tablet 12.5 mg, 12.5 mg, Oral, Q8H PRN, Jadapalle, Sree, MD   QUEtiapine  (SEROQUEL ) tablet 25 mg, 25 mg, Oral, QHS,  Jadapalle, Sree, MD, 25 mg at 03/14/24 2111  Allergies: Allergies  Allergen Reactions   Blood-Group Specific Substance    Haloperidol Other (See Comments)    Unknown   Sulfa Antibiotics Other (See Comments)    Unknown  Delesia Martinek B Josey Dettmann, NP

## 2024-03-15 NOTE — Plan of Care (Signed)
 Pt alert to self. Pt is noncompliant with care at times. Screaming and moving arms when attempted to improve mobility per protocol. Pt refused. Tele placed. Conformation completed. Problem: Health Behavior/Discharge Planning: Goal: Ability to manage health-related needs will improve Outcome: Not Progressing   Problem: Coping: Goal: Level of anxiety will decrease Outcome: Not Progressing   Problem: Fluid Volume: Goal: Ability to maintain a balanced intake and output will improve Outcome: Progressing   Problem: Health Behavior/Discharge Planning: Goal: Ability to identify and utilize available resources and services will improve Outcome: Progressing   Problem: Metabolic: Goal: Ability to maintain appropriate glucose levels will improve Outcome: Progressing   Problem: Nutritional: Goal: Maintenance of adequate nutrition will improve Outcome: Progressing Goal: Progress toward achieving an optimal weight will improve Outcome: Progressing   Problem: Skin Integrity: Goal: Risk for impaired skin integrity will decrease Outcome: Progressing   Problem: Tissue Perfusion: Goal: Adequacy of tissue perfusion will improve Outcome: Progressing   Problem: Education: Goal: Knowledge of General Education information will improve Description: Including pain rating scale, medication(s)/side effects and non-pharmacologic comfort measures Outcome: Progressing   Problem: Health Behavior/Discharge Planning: Goal: Ability to manage health-related needs will improve Outcome: Progressing   Problem: Clinical Measurements: Goal: Ability to maintain clinical measurements within normal limits will improve Outcome: Progressing Goal: Will remain free from infection Outcome: Progressing Goal: Diagnostic test results will improve Outcome: Progressing Goal: Respiratory complications will improve Outcome: Progressing Goal: Cardiovascular complication will be avoided Outcome: Progressing   Problem:  Activity: Goal: Risk for activity intolerance will decrease Outcome: Progressing   Problem: Nutrition: Goal: Adequate nutrition will be maintained Outcome: Progressing   Problem: Elimination: Goal: Will not experience complications related to bowel motility Outcome: Progressing Goal: Will not experience complications related to urinary retention Outcome: Progressing   Problem: Pain Managment: Goal: General experience of comfort will improve and/or be controlled Outcome: Progressing   Problem: Safety: Goal: Ability to remain free from injury will improve Outcome: Progressing   Problem: Skin Integrity: Goal: Risk for impaired skin integrity will decrease Outcome: Progressing

## 2024-03-15 NOTE — Progress Notes (Signed)
 Progress Note   Patient: Rick [name] FMW:[id number] DOB: [birth year] DOA: 03/10/2024     5 DOS: the patient was seen and examined on 03/15/2024   Brief hospital course: 83 y.o. male with medical history significant of medication induced parkinsonism on carbidopa  levodopa , hypertension, diabetes.  Patient is acutely confused and very hard of hearing.  The following information was obtained from chart review and ER report: The patient was found at [facility] today and police were called due to patient reportedly demonstrating bizarre behaviors.  Patient then got in car and attempted to drive and was stopped by 088.  While waiting in police car patient became short of breath which police report may have been secondary to heat.  EMS was called.  On arrival to the ED patient was agitated and confused.  He required multiple rounds of Versed .  He remains encephalopathic and requesting to discharge home.  ED physician placed IVC for patient. Labs are mostly unremarkable.  UA consistent with possible UTI.  Patient has been started on ceftriaxone . Reported rhythm on arrival is A-fib RVR.  Presently he is tremulous with significant artifact, but appears to be NSR in the 80s   When asking patient how he got here he has difficulty answering the question and continues to repeat that he would like to go home.   7/30.  Patient asking to go home.  Patient under involuntary commitment.  Brought in with acute metabolic encephalopathy.  ESBL E. coli growing out of urine culture.  Antibiotics switched over to Zosyn . 7/31.  Continue Zosyn .  Patient tried to kick me because he was agitated that I would not let him go home 8/1.  Continue Zosyn .  Patient calmer today and able to have conversation. 8/1.  Patient calm today and answers questions.  Blood pressure little on the lower side will switch Toprol  over to amiodarone .  Still under IVC commitment as per psychiatry.     Assessment and Plan: Acute  delirium Secondary to urinary infection.  Patient on Seroquel  and Depakote .  Psychiatry team following.  Patient still under IVC commitment.  Patient seems calmer today than yesterday.  Urinary tract infection due to extended-spectrum beta lactamase (ESBL) producing Escherichia coli Rocephin  switched over to Zosyn  on 7/30.  Patient denies that he even has a urinary tract infection.  Continue Zosyn  while here.  Likely will give a dose of fosfomycin prior to discharge.  Paroxysmal atrial fibrillation (HCC) Started on Coreg .  With his altered mental status they held off on any anticoagulation.  When trying to talk to him about anticoagulation he is not understanding the secondary to his hearing loss.  Hesitant on starting anticoagulation if I cannot communicate with him well.  AKI (acute kidney injury) (HCC) Proved from 1.32 down to 0.90.  Vitamin B12 deficiency Supplementing B12  Hard of hearing Have to yell in his ear in order for him to hear.  Controlled type 2 diabetes mellitus without complication, without long-term current use of insulin  (HCC) Hemoglobin A1c 7.0.  Patient on sliding scale insulin  here.  History of bipolar disorder Psychiatry following.  Patient on Seroquel  and Depakote .  Parkinsonism (HCC) Continue Sinemet   Hyponatremia Sodium 1 point less than the normal range.        Subjective: Patient feels okay.  Offers no complaints.  Calm again today.  As per aide had 3 bowel movements.  Admitted with altered mental status and found to have ESBL E. coli urinary infection  Physical Exam: Vitals:   03/14/24  1407 03/14/24 1953 03/14/24 2003 03/15/24 0720  BP: 97/65 (!) 88/58 (!) 93/56 (!) 89/72  Pulse: 98 84 97 99  Resp: 16 17 18 14   Temp: 98.3 F (36.8 C) 98 F (36.7 C) 98 F (36.7 C) 98 F (36.7 C)  TempSrc:  Oral  Oral  SpO2: 95% 97% 92% 97%  Weight:      Height:       Physical Exam HENT:     Head: Normocephalic.  Eyes:     General: Lids are normal.      Conjunctiva/sclera: Conjunctivae normal.  Cardiovascular:     Rate and Rhythm: Normal rate. Rhythm irregularly irregular.     Heart sounds: Normal heart sounds, S1 normal and S2 normal.  Pulmonary:     Breath sounds: No decreased breath sounds, wheezing, rhonchi or rales.  Abdominal:     Palpations: Abdomen is soft.     Tenderness: There is no abdominal tenderness.  Musculoskeletal:     Right lower leg: No swelling.     Left lower leg: No swelling.  Skin:    General: Skin is warm.     Findings: No rash.  Neurological:     Mental Status: He is alert.     Comments: Patient is still hard of hearing.  Patient trying to reason with me to go home rather than getting agitated like he did yesterday.     Data Reviewed: Sodium 134, creatinine 0.95, white blood cell count 7.9, hemoglobin 13.4, hemoglobin to 13  Family Communication: sisters phone kept on ringing  Disposition: Status is: Inpatient Remains inpatient appropriate because: under ivc as per psychiatry  Planned Discharge Destination: Home with Home Health    Time spent: 27 minutes  Author: [name], MD 03/15/2024 12:24 PM  For on call review [email or web address].

## 2024-03-16 DIAGNOSIS — Z8719 Personal history of other diseases of the digestive system: Secondary | ICD-10-CM

## 2024-03-16 DIAGNOSIS — R410 Disorientation, unspecified: Secondary | ICD-10-CM | POA: Diagnosis not present

## 2024-03-16 DIAGNOSIS — I480 Paroxysmal atrial fibrillation: Secondary | ICD-10-CM | POA: Diagnosis not present

## 2024-03-16 DIAGNOSIS — N390 Urinary tract infection, site not specified: Secondary | ICD-10-CM | POA: Diagnosis not present

## 2024-03-16 DIAGNOSIS — N179 Acute kidney failure, unspecified: Secondary | ICD-10-CM | POA: Diagnosis not present

## 2024-03-16 LAB — GLUCOSE, CAPILLARY
Glucose-Capillary: 157 mg/dL — ABNORMAL HIGH (ref 70–99)
Glucose-Capillary: 220 mg/dL — ABNORMAL HIGH (ref 70–99)
Glucose-Capillary: 123 mg/dL — ABNORMAL HIGH (ref 70–99)
Glucose-Capillary: 230 mg/dL — ABNORMAL HIGH (ref 70–99)

## 2024-03-16 MED ORDER — CYANOCOBALAMIN 1000 MCG/ML IJ SOLN
1000.0000 ug | Freq: Once | INTRAMUSCULAR | Status: AC
  Administered 2024-03-16: 1000 ug via INTRAMUSCULAR
  Filled 2024-03-16: qty 1

## 2024-03-16 NOTE — Assessment & Plan Note (Signed)
 Observe

## 2024-03-16 NOTE — Plan of Care (Signed)
 Pt is alert and oriented x 1. Sitter at bedside. Pt continues to get agitated and screams with care. Pt refuses to turn and refuses to perform mobility. He becomes agitated. SBP 80's. Recheck was 100. MD notified and informed of amiodarone  and needing parameters. MD advised to give amiodarone .  Problem: Coping: Goal: Level of anxiety will decrease Outcome: Not Progressing   Problem: Fluid Volume: Goal: Ability to maintain a balanced intake and output will improve Outcome: Progressing   Problem: Nutritional: Goal: Maintenance of adequate nutrition will improve Outcome: Progressing   Problem: Tissue Perfusion: Goal: Adequacy of tissue perfusion will improve Outcome: Progressing   Problem: Clinical Measurements: Goal: Will remain free from infection Outcome: Progressing Goal: Diagnostic test results will improve Outcome: Progressing Goal: Respiratory complications will improve Outcome: Progressing Goal: Cardiovascular complication will be avoided Outcome: Progressing   Problem: Activity: Goal: Risk for activity intolerance will decrease Outcome: Progressing   Problem: Nutrition: Goal: Adequate nutrition will be maintained Outcome: Progressing   Problem: Elimination: Goal: Will not experience complications related to bowel motility Outcome: Progressing Goal: Will not experience complications related to urinary retention Outcome: Progressing   Problem: Pain Managment: Goal: General experience of comfort will improve and/or be controlled Outcome: Progressing   Problem: Safety: Goal: Ability to remain free from injury will improve Outcome: Progressing

## 2024-03-16 NOTE — Progress Notes (Signed)
 Progress Note   Patient: Rick [name] FMW:[id number] DOB: Jul 26, 1941 DOA: 03/10/2024     6 DOS: the patient was seen and examined on 03/16/2024   Brief hospital course: 83 y.o. male with medical history significant of medication induced parkinsonism on carbidopa  levodopa , hypertension, diabetes.  Patient is acutely confused and very hard of hearing.  The following information was obtained from chart review and ER report: The patient was found at [facility] today and police were called due to patient reportedly demonstrating bizarre behaviors.  Patient then got in car and attempted to drive and was stopped by 088.  While waiting in police car patient became short of breath which police report may have been secondary to heat.  EMS was called.  On arrival to the ED patient was agitated and confused.  He required multiple rounds of Versed .  He remains encephalopathic and requesting to discharge home.  ED physician placed IVC for patient. Labs are mostly unremarkable.  UA consistent with possible UTI.  Patient has been started on ceftriaxone . Reported rhythm on arrival is A-fib RVR.  Presently he is tremulous with significant artifact, but appears to be NSR in the 80s   When asking patient how he got here he has difficulty answering the question and continues to repeat that he would like to go home.   7/30.  Patient asking to go home.  Patient under involuntary commitment.  Brought in with acute metabolic encephalopathy.  ESBL E. coli growing out of urine culture.  Antibiotics switched over to Zosyn . 7/31.  Continue Zosyn .  Patient tried to kick me because he was agitated that I would not let him go home 8/1.  Continue Zosyn .  Patient calmer today and able to have conversation. 8/2.  Patient calm today and answers questions.  Blood pressure little on the lower side will switch Toprol  over to amiodarone .  Still under IVC commitment as per psychiatry. 8/3.  Patient again demanding to go home today.   Still under IVC for commitment.  Agreeable to IM B12 shot.      Assessment and Plan: Acute delirium Secondary to urinary infection.  Patient on Seroquel  and Depakote .  Psychiatry team following.  Patient still under IVC commitment as per psychiatry.  Patient today again demanding to go home.  Urinary tract infection due to extended-spectrum beta lactamase (ESBL) producing Escherichia coli Rocephin  switched over to Zosyn  on 7/30.  Patient denies that he even has a urinary tract infection.  Continue Zosyn  while here.  Likely will give a dose of fosfomycin prior to discharge.  Paroxysmal atrial fibrillation (HCC) Toprol  discontinued and switched over to amiodarone  secondary to low blood pressure on 8/2.  I am nervous about starting a blood thinner on him because he does not understand the benefits and the risks.  AKI (acute kidney injury) (HCC) Proved from 1.32 down to 0.95.  Vitamin B12 deficiency B12 level 137.  Supplementing oral B12.  Patient agreeable to B12 injection on 8/3.  Hard of hearing Have to yell in his ear in order for him to hear.  Controlled type 2 diabetes mellitus without complication, without long-term current use of insulin  (HCC) Hemoglobin A1c 7.0.  Patient on sliding scale insulin  here.  History of bipolar disorder Psychiatry following.  Patient on Seroquel  and Depakote .  Parkinsonism (HCC) Continue Sinemet   Hyponatremia Sodium 1 point less than the normal range.  History of abdominal hernia Observe    Subjective: Patient again demanding to go home.  Patient states that I  cannot keep him here against as well.  Patient is involuntary committed so he cannot leave on his own.  Patient admitted with altered mental status and found to have a drug-resistant urinary tract infection.  Physical Exam: Vitals:   03/15/24 1950 03/15/24 2125 03/16/24 0541 03/16/24 0751  BP: (!) 84/55 100/66 98/62 100/60  Pulse: 87 84 88 98  Resp:  20 20 16   Temp:  97.6 F  (36.4 C) 98.5 F (36.9 C) 98.3 F (36.8 C)  TempSrc:  Oral Oral Oral  SpO2: 93% 96% 96% 95%  Weight:      Height:       Physical Exam HENT:     Head: Normocephalic.  Eyes:     General: Lids are normal.     Conjunctiva/sclera: Conjunctivae normal.  Cardiovascular:     Rate and Rhythm: Normal rate. Rhythm irregularly irregular.     Heart sounds: Normal heart sounds, S1 normal and S2 normal.  Pulmonary:     Breath sounds: No decreased breath sounds, wheezing, rhonchi or rales.  Abdominal:     Palpations: Abdomen is soft.     Tenderness: There is no abdominal tenderness.     Hernia: A hernia is present. Hernia is present in the ventral area.  Musculoskeletal:     Right lower leg: No swelling.     Left lower leg: No swelling.  Skin:    General: Skin is warm.     Findings: No rash.  Neurological:     Mental Status: He is alert.     Comments: Patient is still hard of hearing.  Patient again demanding to go home.     Data Reviewed: Last 4 sugars 169, 238, 157 and 220  Family Communication: Spoke with sister on the phone  Disposition: Status is: Inpatient Remains inpatient appropriate because: Still under IVC commitment.  Patient has poor insight to his medical issues.  Concerned about him living alone.  Planned Discharge Destination: To be determined.  Physical therapy now changed treatment plan to rehab    Time spent: 28 minutes Case discussed with nursing staff  Author: [name], MD 03/16/2024 2:22 PM  For on call review [email or web address].

## 2024-03-16 NOTE — Progress Notes (Signed)
 Physical Therapy Treatment Patient Details Name: Rick [name] MRN: [id number] DOB: [birth year] Today's Date: 03/16/2024   History of Present Illness Pt is an 83 y.o. male admitted with AMS. Per chart, pt found at [facility] with bizzare behaviors and police were called, brought to [facility] ED due to EOB. PMH significant for parkinsonism on carbidopa  levodopa , HTN, diabetes. Patient is acutely confused and very hard of hearing.    PT Comments  Pt in bed with sitter.  HOH but does well today with focus on speech in R ear and able to follow cues/discussion.  He is able to get to EOB with mod a x 1 with assist given at upper arms.  Pt yells out when touched in hands/lower arms.  Steady in sitting.  Does require min/mod a x 1 to stand to RW where he has clear gait deficits.  Short shuffling steps with little to no foot clearance and often stops due to freezing gait.  He needs assist to navigate with walker and keep correct placement and assist for turns and transitions to/from bed.  Cues to keep using walker and not reach for footboard when returning to room.  Pt assisted with meal set up in chair position in bed upon return to room.  Pt unable to open containers and does ask for assist with set up and feeding.  Would be unable to manage food preparation and feeding at home alone in current state.  Per chart, pt lives alone and is generally independent with walker PLOF.  Pt continues to need assist for all mobility for safety and balance/gait deficits at this time.  Will change recommendations to <3 hrs a day therapy at this time as pt would not be able to return to home alone at this time.  Will continue to monitor and adjust as needed but at this time it is expected he will need some further assist at discharge.   If plan is discharge home, recommend the following: Assistance with cooking/housework;Supervision due to cognitive status;Assist for transportation;Help with stairs or ramp for entrance;A little  help with walking and/or transfers;A little help with bathing/dressing/bathroom   Can travel by private vehicle        Equipment Recommendations       Recommendations for Other Services       Precautions / Restrictions       Mobility  Bed Mobility Overal bed mobility: Needs Assistance Bed Mobility: Supine to Sit     Supine to sit: Mod assist Sit to supine: Min assist     Patient Response: Cooperative, Restless  Transfers Overall transfer level: Needs assistance Equipment used: Rolling walker (2 wheels) Transfers: Sit to/from Stand Sit to Stand: Min assist, Mod assist                Ambulation/Gait Ambulation/Gait assistance: Min Chemical engineer (Feet): 60 Feet Assistive device: Rolling walker (2 wheels) Gait Pattern/deviations: Step-through pattern, Decreased step length - right, Decreased step length - left, Festinating, Shuffle Gait velocity: dec     General Gait Details: dec step height and length oftern freeaing and needing time to restart gait   Stairs             Wheelchair Mobility     Tilt Bed Tilt Bed Patient Response: Cooperative, Restless  Modified Rankin (Stroke Patients Only)       Balance Overall balance assessment: Needs assistance Sitting-balance support: Feet supported Sitting balance-Leahy Scale: Fair     Standing balance support: Bilateral upper  extremity supported, During functional activity Standing balance-Leahy Scale: Poor                              Communication Communication Communication: Impaired Factors Affecting Communication: Hearing impaired  Cognition Arousal: Alert Behavior During Therapy: Agitated, WFL for tasks assessed/performed   PT - Cognitive impairments: No family/caregiver present to determine baseline                       PT - Cognition Comments: yells our frequently and generally gruff but overall able to particiapte with direction Following commands:  Impaired Following commands impaired: Follows one step commands inconsistently    Cueing Cueing Techniques: Verbal cues, Gestural cues, Tactile cues, Visual cues  Exercises      General Comments        Pertinent Vitals/Pain Pain Assessment Pain Assessment: Faces Faces Pain Scale: Hurts little more Pain Location: when touching hands and lower arms.  prefers assist by upper arms Pain Descriptors / Indicators: Sore, Grimacing Pain Intervention(s): Limited activity within patient's tolerance, Monitored during session, Repositioned    Home Living                          Prior Function            PT Goals (current goals can now be found in the care plan section) Progress towards PT goals: Progressing toward goals    Frequency    Min 2X/week      PT Plan      Co-evaluation              AM-PAC PT 6 Clicks Mobility   Outcome Measure  Help needed turning from your back to your side while in a flat bed without using bedrails?: A Little Help needed moving from lying on your back to sitting on the side of a flat bed without using bedrails?: A Little Help needed moving to and from a bed to a chair (including a wheelchair)?: A Little Help needed standing up from a chair using your arms (e.g., wheelchair or bedside chair)?: A Little Help needed to walk in hospital room?: A Lot Help needed climbing 3-5 steps with a railing? : A Lot 6 Click Score: 16    End of Session Equipment Utilized During Treatment: Gait belt Activity Tolerance: Patient tolerated treatment well Patient left: in bed;with call bell/phone within reach;with bed alarm set;with nursing/sitter in room Nurse Communication: Mobility status PT Visit Diagnosis: Difficulty in walking, not elsewhere classified (R26.2)     Time: 9072-9055 PT Time Calculation (min) (ACUTE ONLY): 17 min  Charges:    $Gait Training: 8-22 mins PT General Charges $$ ACUTE PT VISIT: 1 Visit                    Lauraine Gills, PTA 03/16/24, 9:59 AM

## 2024-03-17 DIAGNOSIS — I480 Paroxysmal atrial fibrillation: Secondary | ICD-10-CM | POA: Diagnosis not present

## 2024-03-17 DIAGNOSIS — G20C Parkinsonism, unspecified: Secondary | ICD-10-CM | POA: Diagnosis not present

## 2024-03-17 DIAGNOSIS — R410 Disorientation, unspecified: Secondary | ICD-10-CM | POA: Diagnosis not present

## 2024-03-17 DIAGNOSIS — N179 Acute kidney failure, unspecified: Secondary | ICD-10-CM | POA: Diagnosis not present

## 2024-03-17 DIAGNOSIS — N390 Urinary tract infection, site not specified: Secondary | ICD-10-CM | POA: Diagnosis not present

## 2024-03-17 DIAGNOSIS — F05 Delirium due to known physiological condition: Secondary | ICD-10-CM | POA: Diagnosis not present

## 2024-03-17 LAB — GLUCOSE, CAPILLARY
Glucose-Capillary: 176 mg/dL — ABNORMAL HIGH (ref 70–99)
Glucose-Capillary: 333 mg/dL — ABNORMAL HIGH (ref 70–99)
Glucose-Capillary: 121 mg/dL — ABNORMAL HIGH (ref 70–99)
Glucose-Capillary: 156 mg/dL — ABNORMAL HIGH (ref 70–99)

## 2024-03-17 MED ORDER — GLIPIZIDE ER 2.5 MG PO TB24
2.5000 mg | ORAL_TABLET | Freq: Every day | ORAL | Status: DC
  Administered 2024-03-17 – 2024-03-19 (×3): 2.5 mg via ORAL
  Filled 2024-03-17 (×3): qty 1

## 2024-03-17 NOTE — Plan of Care (Signed)
   Problem: Fluid Volume: Goal: Ability to maintain a balanced intake and output will improve Outcome: Progressing

## 2024-03-17 NOTE — Progress Notes (Signed)
 Progress Note   Patient: Rick [name] FMW:[id number] DOB: 03-Jun-1941 DOA: 03/10/2024     7 DOS: the patient was seen and examined on 03/17/2024   Brief hospital course: 83 y.o. male with medical history significant of medication induced parkinsonism on carbidopa  levodopa , hypertension, diabetes.  Patient is acutely confused and very hard of hearing.  The following information was obtained from chart review and ER report: The patient was found at [facility] today and police were called due to patient reportedly demonstrating bizarre behaviors.  Patient then got in car and attempted to drive and was stopped by 088.  While waiting in police car patient became short of breath which police report may have been secondary to heat.  EMS was called.  On arrival to the ED patient was agitated and confused.  He required multiple rounds of Versed .  He remains encephalopathic and requesting to discharge home.  ED physician placed IVC for patient. Labs are mostly unremarkable.  UA consistent with possible UTI.  Patient has been started on ceftriaxone . Reported rhythm on arrival is A-fib RVR.  Presently he is tremulous with significant artifact, but appears to be NSR in the 80s   When asking patient how he got here he has difficulty answering the question and continues to repeat that he would like to go home.   7/30.  Patient asking to go home.  Patient under involuntary commitment.  Brought in with acute metabolic encephalopathy.  ESBL E. coli growing out of urine culture.  Antibiotics switched over to Zosyn . 7/31.  Continue Zosyn .  Patient tried to kick me because he was agitated that I would not let him go home 8/1.  Continue Zosyn .  Patient calmer today and able to have conversation. 8/2.  Patient calm today and answers questions.  Blood pressure little on the lower side will switch Toprol  over to amiodarone .  Still under IVC commitment as per psychiatry. 8/3.  Patient again demanding to go home today.   Still under IVC for commitment.  Agreeable to IM B12 shot.      Assessment and Plan: Acute delirium Secondary to urinary infection.  Patient on Seroquel  and Depakote .  Psychiatry team following.  Patient still under IVC commitment as per psychiatry.  Patient today again demanding to go home.  Patient yelling out inappropriately.  Urinary tract infection due to extended-spectrum beta lactamase (ESBL) producing Escherichia coli Rocephin  switched over to Zosyn  on 7/30.  Patient denies that he even has a urinary tract infection.  Continue Zosyn  7 days.  Likely will give a dose of fosfomycin prior to discharge.  Paroxysmal atrial fibrillation (HCC) Toprol  discontinued and switched over to amiodarone  secondary to low blood pressure on 8/2.  I am nervous about starting a blood thinner on him because he does not understand the benefits and the risks.  Heart rate better controlled.  AKI (acute kidney injury) (HCC) Improved from 1.32 down to 0.95.  Vitamin B12 deficiency B12 level 137.  Supplementing oral B12.  Patient agreeable to B12 injection on 8/3.  Hard of hearing Have to yell in his ear in order for him to hear.  Controlled type 2 diabetes mellitus without complication, without long-term current use of insulin  [facility]) Patient now with hyperglycemia.  Hemoglobin A1c 7.0.  Patient on sliding scale insulin  here.  Added Glucotrol  XL low-dose  History of bipolar disorder Psychiatry following.  Patient on Seroquel  and Depakote .  Parkinsonism (HCC) Continue Sinemet   Hyponatremia Sodium 1 point less than the normal range.  History of abdominal hernia Observe      Subjective: Patient again agitated when I told him that he still involuntary committed.  Patient states he does not want to go to rehab and wants to go home.  Admitted with altered mental status and found to have ESBL E. coli infection.  Physical Exam: Vitals:   03/16/24 1645 03/16/24 1919 03/17/24 0441 03/17/24 0755  BP:  115/77 (!) 101/58 109/73 113/88  Pulse: 87 81 90 87  Resp: 17 18  16   Temp: (!) 97.5 F (36.4 C) 97.6 F (36.4 C) 98.1 F (36.7 C) (!) 97.5 F (36.4 C)  TempSrc: Oral Oral  Oral  SpO2: 96% 95% 96% 97%  Weight:      Height:       Physical Exam HENT:     Head: Normocephalic.  Eyes:     General: Lids are normal.     Conjunctiva/sclera: Conjunctivae normal.  Cardiovascular:     Rate and Rhythm: Normal rate. Rhythm irregularly irregular.     Heart sounds: Normal heart sounds, S1 normal and S2 normal.  Pulmonary:     Breath sounds: No decreased breath sounds, wheezing, rhonchi or rales.  Abdominal:     Palpations: Abdomen is soft.     Tenderness: There is no abdominal tenderness.     Hernia: A hernia is present. Hernia is present in the ventral area.  Musculoskeletal:     Right lower leg: No swelling.     Left lower leg: No swelling.  Skin:    General: Skin is warm.     Findings: No rash.  Neurological:     Mental Status: He is alert.     Comments: Patient is still hard of hearing.  Patient again demanding to go home.  I had to tell him numerous times not to yell right next to his ear trying to communicate with him.     Data Reviewed: No new data  Family Communication: Tried to reach sister  Disposition: Status is: Inpatient Remains inpatient appropriate because: Patient still under IVC commitment  Planned Discharge Destination: To be determined    Time spent: 28 minutes Case discussed with pharmacist  Author: [name], MD 03/17/2024 3:01 PM  For on call review [email or web address].

## 2024-03-17 NOTE — Consult Note (Signed)
 [facility] Consult Follow-up  Patient Name: .Rick [name]  MRN: [id number]  DOB: 1940/12/06  Consult Order details:  Orders (From admission, onward)     Start     Ordered   03/10/24 1414  IP CONSULT TO PSYCHIATRY       Ordering Provider: Dicky Anes, MD  Provider:  (Not yet assigned)  Question Answer Comment  Place call to: psych md   Reason for Consult Consult   Diagnosis/Clinical Info for Consult: mental capacity, altered sensorium, IVC      03/10/24 1413             Mode of Visit: In person    Psychiatry Consult Evaluation  Service Date: March 17, 2024 LOS:  LOS: 7 days  Chief Complaint follow up consult  Primary Psychiatric Diagnoses  Delirium due to multiple etiologies--UTI Parkinsonism on Sinemet    Assessment  ISSAAC [name] is a 83 y.o. male admitted: Medically    The patient is an 83 y.o. male with medical history significant of  parkinsonism on carbidopa  levodopa , hypertension, diabetes. Patient is acutely confused and very hard of hearing.  The following information was obtained from chart review and ER report: The patient was found at [facility] today and police were called due to patient reportedly demonstrating bizarre behaviors.  Patient then got in car and attempted to drive and was stopped by 088.  While waiting in police car patient became short of breath which police report may have been secondary to heat.  EMS was called.  On arrival to the ED patient was agitated and confused.  He required multiple rounds of Versed .  He remains encephalopathic and requesting to discharge home.  ED physician placed IVC for patient.Labs are mostly unremarkable.  UA consistent with possible UTI.  Patient has been started on ceftriaxone . Reported rhythm on arrival is A-fib RVR.  Presently he is tremulous with significant artifact, but appears to be NSR in the 80s. When asking patient how he got here he has difficulty answering the question and continues to  repeat that he would like to go home.  No further review of systems or medication history was able to be obtained.       Psychiatry consulted for assessment of capacity and IVC status. On chart review in Care Everywhere there is documentation since 2019 from neurology about his diagnosis of Parkinson's in the context of tremors and memory problems and there was also mention of possible dementia which is also reflected with the brain imaging that shows diffuse cerebral atrophy appropriate for age.  Currently patient is not engaging in any cognitive assessment.  Will maintain the IVC at this point and continue to reach out to family members for surrogate decision making.   03/17/24: On assessment patient remains calm and cooperative and is aware of the treatment plan.  He reports that the team is planning to send him to the rest home.  He denies SI/HI/plan and has displayed safe behaviors.  Will determine the need for IVC tomorrow as patient has been compliant with treatment so far with no behavioral problems.  Diagnoses:  Active Hospital problems: Active Problems:   History of abdominal hernia   Controlled type 2 diabetes mellitus without complication, without long-term current use of insulin  (HCC)   Hard of hearing   Urinary tract infection due to extended-spectrum beta lactamase (ESBL) producing Escherichia coli   Acute delirium   Parkinsonism (HCC)   Paroxysmal atrial fibrillation (HCC)   History of  bipolar disorder   AKI (acute kidney injury) (HCC)   Vitamin B12 deficiency   Hyponatremia    Plan   ## Psychiatric Medication Recommendations:  Will continue with current medications.  Seroquel  25 mg nightly and 12. 5 mg as needed every 8 hours for agitation. Depakote  500 mg daily.   ## Medical Decision Making Capacity:  Mental status improving and is awake, alert, oriented x 3.  He has been compliant with treatment and medications.   ## Further Work-up:   -- most recent EKG on 03/10/2024  had QtC of 445 -- Pertinent labwork reviewed earlier this admission includes: see previous notes   ## Disposition:-- Plan Post Discharge/Psychiatric Care Follow-up resources will continue to follow-up until we understand his baseline.  No psychiatric indication for inpatient hospitalization at this time.  Will manage the behaviors on the medical floor   ## Behavioral / Environmental: -Delirium Precautions: Delirium Interventions for Nursing and Staff: - RN to open blinds every AM. - To Bedside: Glasses, hearing aide, and pt's own shoes. Make available to patients. when possible and encourage use. - Encourage po fluids when appropriate, keep fluids within reach. - OOB to chair with meals. - Passive ROM exercises to all extremities with AM & PM care. - RN to assess orientation to person, time and place QAM and PRN. - Recommend extended visitation hours with familiar family/friends as feasible. - Staff to minimize disturbances at night. Turn off television when pt asleep or when not in use.    ## Safety and Observation Level:  - Based on my clinical evaluation, I estimate the patient to be at low risk of self harm in the current setting. - At this time, we recommend  routine. This decision is based on my review of the chart including patient's history and current presentation, interview of the patient, mental status examination, and consideration of suicide risk including evaluating suicidal ideation, plan, intent, suicidal or self-harm behaviors, risk factors, and protective factors. This judgment is based on our ability to directly address suicide risk, implement suicide prevention strategies, and develop a safety plan while the patient is in the clinical setting. Please contact our team if there is a concern that risk level has changed.  CSSR Risk Category:C-SSRS RISK CATEGORY: No Risk  Suicide Risk Assessment: Patient has following modifiable risk factors for suicide: social isolation, which we are  addressing by collaborating with family members. Patient has following non-modifiable or demographic risk factors for suicide: male gender Patient has the following protective factors against suicide: Supportive family  Thank you for this consult request. Recommendations have been communicated to the primary team.  We will continue to monitor at this time.   Allyn Foil, MD       History of Present Illness  Relevant Aspects of [facility]   03/17/24: Reviewed today patient is noted to be resting in bed.  Sitter was present in the room.  Interview was conducted in written form as patient responds very appropriately to written questions.  He is able to answer the orientation questions about being in a hospital.  He denies feeling unsafe and is not displaying any paranoid behavior.  He denies feeling depressed or anxious and did not endorse any suicidal/homicidal ideation/intent/plan.  He denies auditory/visual hallucinations or any confusion.  He is taking his medications with no reported side effects.    Psychiatric and Social History  Psychiatric History:  Information collected from chart and patient  Prev Dx/Sx: unknown Current Psych Provider: unknown Home  Meds (current): unknown Previous Med Trials: unknown Therapy: unknown  Prior Psych Hospitalization: unknown  Prior Self Harm: uunknownnknown Prior Violence: unknown  Family Psych History: unknown Family Hx suicide: unknown  Social History:   Educational Hx: unknown Occupational Hx: unknown Legal Hx: unknownunknown Living Situation: unknown Spiritual Hx: unknown Access to weapons/lethal means: unknown   Substance History Alcohol: unknown  Type of alcohol unknown Last Drink unknown Number of drinks per day unknown History of alcohol withdrawal seizures unknown History of DT's unknown Tobacco: unknown Illicit drugs: unknown Prescription drug abuse: unknown Rehab hx: unknown  Exam Findings  Physical Exam:  Patient laying comfortably, Easily awakened by calling his name loudly Breathing is non-labored, and patient able to talk. Is alert ,oriented to self, location and situation. Hard of hearing and answers questions appropriately. Attention, concentration, and orientation are all improving at this time.  Vital Signs:  Temp:  [97.5 F (36.4 C)-98.1 F (36.7 C)] 97.5 F (36.4 C) (08/04 1552) Pulse Rate:  [81-96] 96 (08/04 1552) Resp:  [16-18] 18 (08/04 1552) BP: (101-115)/(58-88) 110/68 (08/04 1552) SpO2:  [95 %-97 %] 97 % (08/04 1552) Blood pressure 110/68, pulse 96, temperature (!) 97.5 F (36.4 C), temperature source Oral, resp. rate 18, height 5' 4 (1.626 m), weight 69.9 kg, SpO2 97%. Body mass index is 26.47 kg/m.    Mental Status Exam: General Appearance: Disheveled  Orientation:  Full (Time, Place, and Person)  Memory:  Immediate;   Fair Recent;   Fair Remote;   Fair  Concentration:  Concentration: Fair and Attention Span: Fair  Recall:  Fair  Attention  Fair  Eye Contact:  Fair  Speech:  Normal Rate  Language:  Good  Volume:  Increased  Mood: euthymic  Affect:  Appropriate  Thought Process:  Goal Directed  Thought Content:  WDL  Suicidal Thoughts:  No  Homicidal Thoughts:  No  Judgement:  Fair  Insight:  Fair  Psychomotor Activity:  Normal  Akathisia:  No  Fund of Knowledge:  Fair      Assets:  Desire for Improvement  Cognition:  Impaired,  Mild  ADL's:  Impaired  AIMS (if indicated):        Other History   These have been pulled in through the EMR, reviewed, and updated if appropriate.  Family History:  The patient's family history includes Cancer in his brother; Diabetes in his father; Transient ischemic attack in his mother.  Medical History: Past Medical History:  Diagnosis Date   Anxiety    Bipolar disorder (HCC)    Bladder cancer (HCC)    BPH (benign prostatic hypertrophy)    Chest pain, atypical 06/23/2015   Closed trimalleolar fracture of left  ankle 10/01/2015   Cystitis    hx of    Depression    Diabetes mellitus without complication (HCC)    type 2    Difficult or painful urination 10/31/2012   Dysrhythmia    Ear problem 03/24/2015   Gangrenous appendicitis    H/O urinary disorder 03/27/2013   Herpes genitalis in men    Hyperlipidemia    Hypertension    Insomnia    Kidney stones    Left hand pain 05/18/2015   Mass of arm 02/10/2015   Peripheral neuropathy    Skin cancer    UD (urethral discharge) 10/31/2012   Urinary tract infection    hx of     Surgical History: Past Surgical History:  Procedure Laterality Date   APPENDECTOMY     RUPTURED  BLADDER SURGERY     CHOLECYSTECTOMY     COLOSTOMY     AND LATER CLOSURE   CYSTOSCOPY/URETEROSCOPY/HOLMIUM LASER/STENT PLACEMENT Left 03/22/2018   Procedure: CYSTOSCOPY/LEFT URETEROSCOPY/LEFT RETROGRADE PYELOGRAM;  Surgeon: Carolee Sherwood JONETTA Migel, MD;  Location: [facility];  Service: Urology;  Laterality: Left;   EPIDIDYMECTOMY N/A 01/03/2019   Procedure: RIGHT EPIDIDYMAL CYST REMOVAL VERSES EPIDIDMECTOMY;  Surgeon: Carolee Sherwood JONETTA Rangel, MD;  Location: [facility];  Service: Urology;  Laterality: N/A;   FRACTURE SURGERY     HERNIA REPAIR     INNER EAR SURGERY Left    ORIF ANKLE FRACTURE Left 09/27/2015   Procedure: OPEN REDUCTION INTERNAL FIXATION (ORIF) ANKLE FRACTURE;  Surgeon: Norleen JINNY Maltos, MD;  Location: [facility];  Service: Orthopedics;  Laterality: Left;   SKIN CANCER EXCISION     TRANSURETHRAL RESECTION OF PROSTATE N/A 03/22/2018   Procedure: TRANSURETHRAL RESECTION OF THE PROSTATE (TURP);  Surgeon: Carolee Sherwood JONETTA Jessi, MD;  Location: [facility];  Service: Urology;  Laterality: N/A;   VENTRAL HERNIA REPAIR N/A 01/14/2016   Procedure: HERNIA REPAIR VENTRAL ADULT;  Surgeon: Larinda Unknown Sharps, MD;  Location: [facility];  Service: General;  Laterality: N/A;     Medications:   Current Facility-Administered Medications:    acetaminophen  (TYLENOL ) tablet 650 mg, 650 mg, Oral, Q6H PRN, Dezii,  Alexandra, DO   amiodarone  (PACERONE ) tablet 200 mg, 200 mg, Oral, BID, Josette, Richard, MD, 200 mg at 03/17/24 9062   carbidopa -levodopa  (SINEMET  IR) 25-100 MG per tablet immediate release 1 tablet, 1 tablet, Oral, TID, Dezii, Alexandra, DO, 1 tablet at 03/17/24 9063   cyanocobalamin  (VITAMIN B12) tablet 1,000 mcg, 1,000 mcg, Oral, Daily, Josette, Richard, MD, 1,000 mcg at 03/17/24 9063   diclofenac  Sodium (VOLTAREN ) 1 % topical gel 2 g, 2 g, Topical, QID, Wieting, Richard, MD, 2 g at 03/17/24 9062   divalproex  (DEPAKOTE  ER) 24 hr tablet 500 mg, 500 mg, Oral, Daily, Davarious Tumbleson, MD, 500 mg at 03/17/24 9063   glipiZIDE  (GLUCOTROL  XL) 24 hr tablet 2.5 mg, 2.5 mg, Oral, Q breakfast, Wieting, Richard, MD, 2.5 mg at 03/17/24 0936   heparin  injection 5,000 Units, 5,000 Units, Subcutaneous, Q8H, Dezii, Alexandra, DO, 5,000 Units at 03/17/24 1325   HYDROcodone -acetaminophen  (NORCO/VICODIN) 5-325 MG per tablet 1 tablet, 1 tablet, Oral, Q6H PRN, Dezii, Alexandra, DO, 1 tablet at 03/12/24 2109   insulin  aspart (novoLOG ) injection 0-9 Units, 0-9 Units, Subcutaneous, TID WC, Dezii, Alexandra, DO, 9 Units at 03/17/24 1326   LORazepam  (ATIVAN ) injection 2 mg, 2 mg, Intravenous, Q4H PRN, Dezii, Alexandra, DO   piperacillin -tazobactam (ZOSYN ) IVPB 3.375 g, 3.375 g, Intravenous, Q8H, Wieting, Richard, MD, Last Rate: 12.5 mL/hr at 03/17/24 1326, 3.375 g at 03/17/24 1326   QUEtiapine  (SEROQUEL ) tablet 12.5 mg, 12.5 mg, Oral, Q8H PRN, Michole Lecuyer, MD, 12.5 mg at 03/17/24 1325   QUEtiapine  (SEROQUEL ) tablet 25 mg, 25 mg, Oral, QHS, Zykira Matlack, MD, 25 mg at 03/16/24 2100  Allergies: Allergies  Allergen Reactions   Blood-Group Specific Substance    Haloperidol Other (See Comments)    Unknown   Sulfa Antibiotics Other (See Comments)    Unknown    Allyn Foil, MD

## 2024-03-17 NOTE — Progress Notes (Signed)
 Physical Therapy Treatment Patient Details Name: Rick [name] MRN: [id number] DOB: 10-27-1940 Today's Date: 03/17/2024   History of Present Illness Pt is an 83 y.o. male admitted with AMS. Per chart, pt found at [facility] with bizzare behaviors and police were called, brought to [facility] ED due to EOB. PMH significant for parkinsonism on carbidopa  levodopa , HTN, diabetes. Patient is acutely confused and very hard of hearing.    PT Comments  Pt in bed.  Agrees to gait but does yell out for wanting to talk to psychiatry I'm going to loose everything!  He is able to get to EOB with mod a x 1 and care to assist at upper arms vs hands/lower arms due to pt discomfort.  He stands with min a x 2 and is able to walk 55' with Parkinsons gait pattern often freezing and restarting during session.  He does get frustrated with freezing and will yell out Push me on my back He is seated in chair at end of session as nursing in changing bed and bathing is needed.  RN in room to attend with pt until sitter returns.   If plan is discharge home, recommend the following: Assistance with cooking/housework;Supervision due to cognitive status;Assist for transportation;Help with stairs or ramp for entrance;A little help with walking and/or transfers;A little help with bathing/dressing/bathroom   Can travel by private vehicle        Equipment Recommendations       Recommendations for Other Services       Precautions / Restrictions Restrictions Weight Bearing Restrictions Per Provider Order: No     Mobility  Bed Mobility Overal bed mobility: Needs Assistance Bed Mobility: Supine to Sit     Supine to sit: Mod assist       Patient Response: Anxious, Impulsive  Transfers Overall transfer level: Needs assistance Equipment used: Rolling walker (2 wheels) Transfers: Sit to/from Stand Sit to Stand: Min assist, +2 safety/equipment                Ambulation/Gait Ambulation/Gait assistance: Min  assist Gait Distance (Feet): 60 Feet Assistive device: Rolling walker (2 wheels) Gait Pattern/deviations: Step-through pattern, Decreased step length - right, Decreased step length - left, Festinating, Shuffle Gait velocity: dec     General Gait Details: often freezing and needing time to restart.  he is able to take some good steps then freezing limits gait   Stairs             Wheelchair Mobility     Tilt Bed Tilt Bed Patient Response: Anxious, Impulsive  Modified Rankin (Stroke Patients Only)       Balance Overall balance assessment: Needs assistance Sitting-balance support: Feet supported Sitting balance-Leahy Scale: Fair     Standing balance support: Bilateral upper extremity supported, During functional activity Standing balance-Leahy Scale: Poor                              Communication Communication Communication: Impaired Factors Affecting Communication: Hearing impaired  Cognition Arousal: Alert Behavior During Therapy: Agitated, WFL for tasks assessed/performed   PT - Cognitive impairments: No family/caregiver present to determine baseline                       PT - Cognition Comments: yells our frequently and generally gruff but overall able to particiapte with direction Following commands: Impaired Following commands impaired: Follows one step commands inconsistently    Cueing  Cueing Techniques: Verbal cues, Gestural cues, Tactile cues, Visual cues  Exercises      General Comments        Pertinent Vitals/Pain Pain Assessment Pain Assessment: Faces Faces Pain Scale: Hurts little more Pain Location: when touching hands and lower arms.  prefers assist by upper arms Pain Descriptors / Indicators: Sore, Grimacing Pain Intervention(s): Limited activity within patient's tolerance, Monitored during session, Repositioned    Home Living                          Prior Function            PT Goals (current  goals can now be found in the care plan section) Progress towards PT goals: Progressing toward goals    Frequency    Min 2X/week      PT Plan      Co-evaluation              AM-PAC PT 6 Clicks Mobility   Outcome Measure  Help needed turning from your back to your side while in a flat bed without using bedrails?: A Little Help needed moving from lying on your back to sitting on the side of a flat bed without using bedrails?: A Little Help needed moving to and from a bed to a chair (including a wheelchair)?: A Little Help needed standing up from a chair using your arms (e.g., wheelchair or bedside chair)?: A Little Help needed to walk in hospital room?: A Lot Help needed climbing 3-5 steps with a railing? : Total 6 Click Score: 15    End of Session Equipment Utilized During Treatment: Gait belt Activity Tolerance: Patient tolerated treatment well Patient left: in chair;with call bell/phone within reach;with nursing/sitter in room Nurse Communication: Mobility status PT Visit Diagnosis: Difficulty in walking, not elsewhere classified (R26.2)     Time: 9064-9050 PT Time Calculation (min) (ACUTE ONLY): 14 min  Charges:    $Gait Training: 8-22 mins PT General Charges $$ ACUTE PT VISIT: 1 Visit                   Lauraine Gills, PTA 03/17/24, 10:04 AM

## 2024-03-17 NOTE — Inpatient Diabetes Management (Signed)
 Inpatient Diabetes Program Recommendations  AACE/ADA: New Consensus Statement on Inpatient Glycemic Control   Target Ranges:  Prepandial:   less than 140 mg/dL      Peak postprandial:   less than 180 mg/dL (1-2 hours)      Critically ill patients:  140 - 180 mg/dL    Latest Reference Range & Units 03/16/24 07:26 03/16/24 11:27 03/16/24 16:45 03/16/24 20:34 03/17/24 07:25 03/17/24 11:31  Glucose-Capillary 70 - 99 mg/dL 842 (H) 779 (H) 876 (H) 230 (H) 176 (H) 333 (H)   Review of Glycemic Control  Diabetes history: DM2 Outpatient Diabetes medications: Glipizide  10 mg daily, Metformin  XR 1000 mg BID, Basaglar  15 units daily Current orders for Inpatient glycemic control: Novolog  0-9 units TID with meals, Glipizide  XL 2.5 mg QAM  Inpatient Diabetes Program Recommendations:    Oral DM medications: Noted Glipizide  ordered today.  Diet: May want to consider changing diet from Regular to Carb Modified.  Thanks, Earnie Gainer, RN, MSN, CDCES Diabetes Coordinator Inpatient Diabetes Program [phone] (Team Pager from 8am to 5pm)

## 2024-03-17 NOTE — Progress Notes (Signed)
 OT Cancellation Note  Patient Details Name: Rick [name] MRN: [id number] DOB: 03-Apr-1941   Cancelled Treatment:    Reason Eval/Treat Not Completed: Other (comment). Pt with nurse tech finishing up care after BM and bath with pt returned to bed. Will re-attempt OT tx at later time as pt is available and appropriate.   Cyrstal Leitz R., MPH, MS, OTR/L ascom [phone] 03/17/24, 10:46 AM

## 2024-03-18 DIAGNOSIS — N390 Urinary tract infection, site not specified: Secondary | ICD-10-CM | POA: Diagnosis not present

## 2024-03-18 DIAGNOSIS — N179 Acute kidney failure, unspecified: Secondary | ICD-10-CM | POA: Diagnosis not present

## 2024-03-18 DIAGNOSIS — R410 Disorientation, unspecified: Secondary | ICD-10-CM | POA: Diagnosis not present

## 2024-03-18 DIAGNOSIS — F05 Delirium due to known physiological condition: Secondary | ICD-10-CM | POA: Diagnosis not present

## 2024-03-18 DIAGNOSIS — G20C Parkinsonism, unspecified: Secondary | ICD-10-CM | POA: Diagnosis not present

## 2024-03-18 DIAGNOSIS — I480 Paroxysmal atrial fibrillation: Secondary | ICD-10-CM | POA: Diagnosis not present

## 2024-03-18 LAB — GLUCOSE, CAPILLARY
Glucose-Capillary: 158 mg/dL — ABNORMAL HIGH (ref 70–99)
Glucose-Capillary: 167 mg/dL — ABNORMAL HIGH (ref 70–99)
Glucose-Capillary: 163 mg/dL — ABNORMAL HIGH (ref 70–99)
Glucose-Capillary: 163 mg/dL — ABNORMAL HIGH (ref 70–99)

## 2024-03-18 MED ORDER — METOPROLOL SUCCINATE ER 25 MG PO TB24
12.5000 mg | ORAL_TABLET | Freq: Every day | ORAL | Status: DC
  Administered 2024-03-18 – 2024-03-19 (×2): 12.5 mg via ORAL
  Filled 2024-03-18 (×2): qty 1

## 2024-03-18 MED ORDER — AMIODARONE HCL 200 MG PO TABS
400.0000 mg | ORAL_TABLET | Freq: Two times a day (BID) | ORAL | Status: DC
  Administered 2024-03-18 – 2024-03-19 (×2): 400 mg via ORAL
  Filled 2024-03-18 (×2): qty 2

## 2024-03-18 MED ORDER — FOSFOMYCIN TROMETHAMINE 3 G PO PACK
3.0000 g | PACK | Freq: Once | ORAL | Status: AC
  Administered 2024-03-18: 3 g via ORAL
  Filled 2024-03-18: qty 3

## 2024-03-18 NOTE — Progress Notes (Signed)
 Occupational Therapy Treatment Patient Details Name: Rick [name] MRN: [id number] DOB: 08-Aug-1941 Today's Date: 03/18/2024   History of present illness Pt is an 83 y.o. male admitted with AMS. Per chart, pt found at [facility] with bizzare behaviors and police were called, brought to [facility] ED due to EOB. PMH significant for parkinsonism on carbidopa  levodopa , HTN, diabetes. Patient is acutely confused and very hard of hearing.   OT comments  Pt seen for OT tx. While ambulating pt required VC for visual markers to help improve mobility when pt got stuck or began to shuffle his feet. While requiring MAX A to don socks at bed level prior to mobility efforts, pt was able to sick EOB without LOB to doff socks using feet to push them off. Pt continues to be somewhat limited by agitation intermittently. Continues to benefit from skilled OT services. HR up to 144 with exertion, down to high 90's with return to bed.       If plan is discharge home, recommend the following:  A lot of help with walking and/or transfers;A lot of help with bathing/dressing/bathroom;Assistance with cooking/housework;Direct supervision/assist for medications management;Assist for transportation;Supervision due to cognitive status;Help with stairs or ramp for entrance;Direct supervision/assist for financial management   Equipment Recommendations  None recommended by OT    Recommendations for Other Services      Precautions / Restrictions Precautions Precautions: Fall Recall of Precautions/Restrictions: Impaired Restrictions Weight Bearing Restrictions Per Provider Order: No       Mobility Bed Mobility Overal bed mobility: Needs Assistance Bed Mobility: Supine to Sit, Sit to Supine     Supine to sit: Mod assist Sit to supine: Min assist        Transfers Overall transfer level: Needs assistance Equipment used: Rolling walker (2 wheels) Transfers: Sit to/from Stand Sit to Stand: Min assist                  Balance Overall balance assessment: Needs assistance Sitting-balance support: Feet supported Sitting balance-Leahy Scale: Fair     Standing balance support: Bilateral upper extremity supported, During functional activity Standing balance-Leahy Scale: Poor                             ADL either performed or assessed with clinical judgement   ADL Overall ADL's : Needs assistance/impaired     Grooming: Sitting;Set up;Supervision/safety Grooming Details (indicate cue type and reason): no LOB while seated EOB, VC to initiate             Lower Body Dressing: Maximal assistance;Bed level;Sitting/lateral leans;Supervision/safety Lower Body Dressing Details (indicate cue type and reason): Required MAX A for donning socks at bed level, able to doff socks seated EOB using feet to push them off.                    Extremity/Trunk Assessment              Occupational psychologist Communication: Impaired Factors Affecting Communication: Hearing impaired   Cognition Arousal: Alert Behavior During Therapy: Agitated Cognition: Cognition impaired             OT - Cognition Comments: Pt extremely HOH. Outbursts of yelling when he does not want to do something.                 Following commands:  Impaired Following commands impaired: Follows one step commands inconsistently      Cueing   Cueing Techniques: Verbal cues, Gestural cues, Tactile cues, Visual cues  Exercises      Shoulder Instructions       General Comments      Pertinent Vitals/ Pain       Pain Assessment Pain Assessment: Faces Faces Pain Scale: Hurts even more Pain Location: when touching hands and lower arms.  prefers assist by upper arms Pain Descriptors / Indicators: Sore, Grimacing Pain Intervention(s): Limited activity within patient's tolerance, Monitored during session, Repositioned  Home Living                                           Prior Functioning/Environment              Frequency  Min 2X/week        Progress Toward Goals  OT Goals(current goals can now be found in the care plan section)  Progress towards OT goals: Progressing toward goals  Acute Rehab OT Goals OT Goal Formulation: Patient unable to participate in goal setting Time For Goal Achievement: 03/26/24 Potential to Achieve Goals: Fair  Plan      Co-evaluation                 AM-PAC OT 6 Clicks Daily Activity     Outcome Measure   Help from another person eating meals?: None Help from another person taking care of personal grooming?: A Little Help from another person toileting, which includes using toliet, bedpan, or urinal?: A Lot Help from another person bathing (including washing, rinsing, drying)?: A Lot Help from another person to put on and taking off regular upper body clothing?: A Lot Help from another person to put on and taking off regular lower body clothing?: A Lot 6 Click Score: 15    End of Session Equipment Utilized During Treatment: Rolling walker (2 wheels)  OT Visit Diagnosis: Unsteadiness on feet (R26.81);Other abnormalities of gait and mobility (R26.89);Other symptoms and signs involving cognitive function   Activity Tolerance Patient tolerated treatment well   Patient Left in bed;with call bell/phone within reach;with bed alarm set;with nursing/sitter in room   Nurse Communication          Time: 8990-8973 OT Time Calculation (min): 17 min  Charges: OT General Charges $OT Visit: 1 Visit OT Treatments $Self Care/Home Management : 8-22 mins  Warren SAUNDERS., MPH, MS, OTR/L ascom [phone] 03/18/24, 1:42 PM

## 2024-03-18 NOTE — Consult Note (Signed)
 [facility] Consult Follow-up  Patient Name: .Rick [name]  MRN: [id number]  DOB: [birth year]  Consult Order details:  Orders (From admission, onward)     Start     Ordered   03/10/24 1414  IP CONSULT TO PSYCHIATRY       Ordering Provider: Dicky Anes, MD  Provider:  (Not yet assigned)  Question Answer Comment  Place call to: psych md   Reason for Consult Consult   Diagnosis/Clinical Info for Consult: mental capacity, altered sensorium, IVC      03/10/24 1413             Mode of Visit: In person    Psychiatry Consult Evaluation  Service Date: March 18, 2024 LOS:  LOS: 8 days  Chief Complaint follow up consult  Primary Psychiatric Diagnoses  Delirium due to multiple etiologies--UTI Parkinsonism on Sinemet    Assessment  Rick [name] is a 83 y.o. male admitted: Medically    The patient is an 83 y.o. male with medical history significant of  parkinsonism on carbidopa  levodopa , hypertension, diabetes. Patient is acutely confused and very hard of hearing.  The following information was obtained from chart review and ER report: The patient was found at [facility] today and police were called due to patient reportedly demonstrating bizarre behaviors.  Patient then got in car and attempted to drive and was stopped by 088.  While waiting in police car patient became short of breath which police report may have been secondary to heat.  EMS was called.  On arrival to the ED patient was agitated and confused.  He required multiple rounds of Versed .  He remains encephalopathic and requesting to discharge home.  ED physician placed IVC for patient.Labs are mostly unremarkable.  UA consistent with possible UTI.  Patient has been started on ceftriaxone . Reported rhythm on arrival is A-fib RVR.  Presently he is tremulous with significant artifact, but appears to be NSR in the 80s. Psychiatry is consulted for safety eval.      03/18/2024: On assessment patient remains calm and  cooperative.  His orientation and mental status improved significantly.  He remains compliant with medications.  He denies SI/HI/plan and denies hallucinations.  There is no indication of IVC at this time.  We rescinded the IVC today.  No indication of inpatient psychiatric admission at this time  Diagnoses:  Active Hospital problems: Active Problems:   History of abdominal hernia   Controlled type 2 diabetes mellitus without complication, without long-term current use of insulin  (HCC)   Hard of hearing   Urinary tract infection due to extended-spectrum beta lactamase (ESBL) producing Escherichia coli   Acute delirium   Parkinsonism (HCC)   Paroxysmal atrial fibrillation (HCC)   History of bipolar disorder   AKI (acute kidney injury) (HCC)   Vitamin B12 deficiency   Hyponatremia    Plan   ## Psychiatric Medication Recommendations:  Will continue with current medications.  Seroquel  25 mg nightly and 12. 5 mg as needed every 8 hours for agitation. Depakote  500 mg daily.   ## Medical Decision Making Capacity:  Mental status improving and is awake, alert, oriented x 3.  He has been compliant with treatment and medications.  No indication for IVC at this time  ## Further Work-up:   -- most recent EKG on 03/10/2024 had QtC of 445 -- Pertinent labwork reviewed earlier this admission includes: see previous notes   ## Disposition:--No psychiatric contraindication for discharge  ## Behavioral / Environmental: -Delirium  Precautions: Delirium Interventions for Nursing and Staff: - RN to open blinds every AM. - To Bedside: Glasses, hearing aide, and pt's own shoes. Make available to patients. when possible and encourage use. - Encourage po fluids when appropriate, keep fluids within reach. - OOB to chair with meals. - Passive ROM exercises to all extremities with AM & PM care. - RN to assess orientation to person, time and place QAM and PRN. - Recommend extended visitation hours with familiar  family/friends as feasible. - Staff to minimize disturbances at night. Turn off television when pt asleep or when not in use.    ## Safety and Observation Level:  - Based on my clinical evaluation, I estimate the patient to be at low risk of self harm in the current setting. - At this time, we recommend  routine. This decision is based on my review of the chart including patient's history and current presentation, interview of the patient, mental status examination, and consideration of suicide risk including evaluating suicidal ideation, plan, intent, suicidal or self-harm behaviors, risk factors, and protective factors. This judgment is based on our ability to directly address suicide risk, implement suicide prevention strategies, and develop a safety plan while the patient is in the clinical setting. Please contact our team if there is a concern that risk level has changed.  CSSR Risk Category:C-SSRS RISK CATEGORY: No Risk  Suicide Risk Assessment: Patient has following modifiable risk factors for suicide: social isolation, which we are addressing by collaborating with family members. Patient has following non-modifiable or demographic risk factors for suicide: male gender Patient has the following protective factors against suicide: Supportive family  Thank you for this consult request. Recommendations have been communicated to the primary team.  We will sign off at this time.   Clarine Elrod, MD       History of Present Illness  Relevant Aspects of [facility]   03/18/24: Today on assessment patient is noted to be resting in bed.  He answers most of the return questions very appropriately.  He is able to recognize he is in the hospital.  He denies any problems with the medications.  He reports that he has a supportive family and wants to go home.  He denies SI/HI/plan and denies any confusion or hallucinations.  Per sitter patient has not had any unsafe behaviors or aggressive  behaviors in the last few days.    Psychiatric and Social History  Psychiatric History:  Information collected from chart and patient  Prev Dx/Sx: unknown Current Psych Provider: unknown Home Meds (current): unknown Previous Med Trials: unknown Therapy: unknown  Prior Psych Hospitalization: unknown  Prior Self Harm: uunknownnknown Prior Violence: unknown  Family Psych History: unknown Family Hx suicide: unknown  Social History:   Educational Hx: unknown Occupational Hx: unknown Legal Hx: unknownunknown Living Situation: unknown Spiritual Hx: unknown Access to weapons/lethal means: unknown   Substance History Alcohol: unknown  Type of alcohol unknown Last Drink unknown Number of drinks per day unknown History of alcohol withdrawal seizures unknown History of DT's unknown Tobacco: unknown Illicit drugs: unknown Prescription drug abuse: unknown Rehab hx: unknown  Exam Findings  Physical Exam: Patient laying comfortably, Easily awakened by calling his name loudly Breathing is non-labored, and patient able to talk. Is alert ,oriented to self, location and situation. Hard of hearing and answers questions appropriately. Attention, concentration, and orientation are all improving at this time.  Vital Signs:  Temp:  [98.1 F (36.7 C)-98.2 F (36.8 C)] 98.2 F (36.8  C) (08/05 1527) Pulse Rate:  [80-90] 90 (08/05 1527) Resp:  [17-18] 18 (08/05 1527) BP: (105-120)/(62-82) 105/71 (08/05 1527) SpO2:  [96 %-97 %] 97 % (08/05 1527) Blood pressure 105/71, pulse 90, temperature 98.2 F (36.8 C), resp. rate 18, height 5' 4 (1.626 m), weight 69.9 kg, SpO2 97%. Body mass index is 26.47 kg/m.    Mental Status Exam: General Appearance: stated age  Orientation:  Full (Time, Place, and Person)  Memory:  Immediate;   Fair Recent;   Fair Remote;   Fair  Concentration:  Concentration: Fair and Attention Span: Fair  Recall:  Fair  Attention  Fair  Eye Contact:  Fair  Speech:   Normal Rate  Language:  Good  Volume:  Increased  Mood: euthymic  Affect:  Appropriate  Thought Process:  Goal Directed  Thought Content:  WDL  Suicidal Thoughts:  No  Homicidal Thoughts:  No  Judgement:  Fair  Insight:  Fair  Psychomotor Activity:  Normal  Akathisia:  No  Fund of Knowledge:  Fair      Assets:  Desire for Improvement  Cognition:  Impaired,  Mild  ADL's:  Impaired  AIMS (if indicated):        Other History   These have been pulled in through the EMR, reviewed, and updated if appropriate.  Family History:  The patient's family history includes Cancer in his brother; Diabetes in his father; Transient ischemic attack in his mother.  Medical History: Past Medical History:  Diagnosis Date   Anxiety    Bipolar disorder (HCC)    Bladder cancer (HCC)    BPH (benign prostatic hypertrophy)    Chest pain, atypical 06/23/2015   Closed trimalleolar fracture of left ankle 10/01/2015   Cystitis    hx of    Depression    Diabetes mellitus without complication (HCC)    type 2    Difficult or painful urination 10/31/2012   Dysrhythmia    Ear problem 03/24/2015   Gangrenous appendicitis    H/O urinary disorder 03/27/2013   Herpes genitalis in men    Hyperlipidemia    Hypertension    Insomnia    Kidney stones    Left hand pain 05/18/2015   Mass of arm 02/10/2015   Peripheral neuropathy    Skin cancer    UD (urethral discharge) 10/31/2012   Urinary tract infection    hx of     Surgical History: Past Surgical History:  Procedure Laterality Date   APPENDECTOMY     RUPTURED   BLADDER SURGERY     CHOLECYSTECTOMY     COLOSTOMY     AND LATER CLOSURE   CYSTOSCOPY/URETEROSCOPY/HOLMIUM LASER/STENT PLACEMENT Left 03/22/2018   Procedure: CYSTOSCOPY/LEFT URETEROSCOPY/LEFT RETROGRADE PYELOGRAM;  Surgeon: Carolee Sherwood JONETTA Kaheem, MD;  Location: [facility];  Service: Urology;  Laterality: Left;   EPIDIDYMECTOMY N/A 01/03/2019   Procedure: RIGHT EPIDIDYMAL CYST REMOVAL VERSES  EPIDIDMECTOMY;  Surgeon: Carolee Sherwood JONETTA Rayshaun, MD;  Location: [facility];  Service: Urology;  Laterality: N/A;   FRACTURE SURGERY     HERNIA REPAIR     INNER EAR SURGERY Left    ORIF ANKLE FRACTURE Left 09/27/2015   Procedure: OPEN REDUCTION INTERNAL FIXATION (ORIF) ANKLE FRACTURE;  Surgeon: Norleen JINNY Maltos, MD;  Location: [facility];  Service: Orthopedics;  Laterality: Left;   SKIN CANCER EXCISION     TRANSURETHRAL RESECTION OF PROSTATE N/A 03/22/2018   Procedure: TRANSURETHRAL RESECTION OF THE PROSTATE (TURP);  Surgeon: Carolee Sherwood JONETTA Weslie, MD;  Location: [facility];  Service: Urology;  Laterality: N/A;   VENTRAL HERNIA REPAIR N/A 01/14/2016   Procedure: HERNIA REPAIR VENTRAL ADULT;  Surgeon: Larinda Unknown Sharps, MD;  Location: [facility];  Service: General;  Laterality: N/A;     Medications:   Current Facility-Administered Medications:    acetaminophen  (TYLENOL ) tablet 650 mg, 650 mg, Oral, Q6H PRN, Dezii, Alexandra, DO   amiodarone  (PACERONE ) tablet 400 mg, 400 mg, Oral, BID, Wieting, Richard, MD   carbidopa -levodopa  (SINEMET  IR) 25-100 MG per tablet immediate release 1 tablet, 1 tablet, Oral, TID, Dezii, Alexandra, DO, 1 tablet at 03/18/24 1545   cyanocobalamin  (VITAMIN B12) tablet 1,000 mcg, 1,000 mcg, Oral, Daily, Wieting, Richard, MD, 1,000 mcg at 03/18/24 1125   diclofenac  Sodium (VOLTAREN ) 1 % topical gel 2 g, 2 g, Topical, QID, Wieting, Richard, MD, 2 g at 03/18/24 1125   divalproex  (DEPAKOTE  ER) 24 hr tablet 500 mg, 500 mg, Oral, Daily, Tinnie Kunin, MD, 500 mg at 03/18/24 1125   glipiZIDE  (GLUCOTROL  XL) 24 hr tablet 2.5 mg, 2.5 mg, Oral, Q breakfast, Wieting, Richard, MD, 2.5 mg at 03/18/24 0834   heparin  injection 5,000 Units, 5,000 Units, Subcutaneous, Q8H, Dezii, Alexandra, DO, 5,000 Units at 03/18/24 1545   HYDROcodone -acetaminophen  (NORCO/VICODIN) 5-325 MG per tablet 1 tablet, 1 tablet, Oral, Q6H PRN, Dezii, Alexandra, DO, 1 tablet at 03/12/24 2109   insulin  aspart (novoLOG ) injection 0-9  Units, 0-9 Units, Subcutaneous, TID WC, Dezii, Alexandra, DO, 2 Units at 03/18/24 1733   LORazepam  (ATIVAN ) injection 2 mg, 2 mg, Intravenous, Q4H PRN, Dezii, Alexandra, DO   metoprolol  succinate (TOPROL -XL) 24 hr tablet 12.5 mg, 12.5 mg, Oral, Daily, Wieting, Richard, MD, 12.5 mg at 03/18/24 1545   QUEtiapine  (SEROQUEL ) tablet 12.5 mg, 12.5 mg, Oral, Q8H PRN, Aidynn Krenn, MD, 12.5 mg at 03/17/24 1325   QUEtiapine  (SEROQUEL ) tablet 25 mg, 25 mg, Oral, QHS, Jannel Lynne, MD, 25 mg at 03/17/24 2208  Allergies: Allergies  Allergen Reactions   Blood-Group Specific Substance    Haloperidol Other (See Comments)    Unknown   Sulfa Antibiotics Other (See Comments)    Unknown    Allyn Foil, MD

## 2024-03-18 NOTE — Progress Notes (Signed)
 Progress Note   Patient: Rick [name] FMW:[id number] DOB: 1941-05-27 DOA: 03/10/2024     8 DOS: the patient was seen and examined on 03/18/2024   Brief hospital course: 83 y.o. male with medical history significant of medication induced parkinsonism on carbidopa  levodopa , hypertension, diabetes.  Patient is acutely confused and very hard of hearing.  The following information was obtained from chart review and ER report: The patient was found at [facility] today and police were called due to patient reportedly demonstrating bizarre behaviors.  Patient then got in car and attempted to drive and was stopped by 088.  While waiting in police car patient became short of breath which police report may have been secondary to heat.  EMS was called.  On arrival to the ED patient was agitated and confused.  He required multiple rounds of Versed .  He remains encephalopathic and requesting to discharge home.  ED physician placed IVC for patient. Labs are mostly unremarkable.  UA consistent with possible UTI.  Patient has been started on ceftriaxone . Reported rhythm on arrival is A-fib RVR.  Presently he is tremulous with significant artifact, but appears to be NSR in the 80s   When asking patient how he got here he has difficulty answering the question and continues to repeat that he would like to go home.   7/30.  Patient asking to go home.  Patient under involuntary commitment.  Brought in with acute metabolic encephalopathy.  ESBL E. coli growing out of urine culture.  Antibiotics switched over to Zosyn . 7/31.  Continue Zosyn .  Patient tried to kick me because he was agitated that I would not let him go home 8/1.  Continue Zosyn .  Patient calmer today and able to have conversation. 8/2.  Patient calm today and answers questions.  Blood pressure little on the lower side will switch Toprol  over to amiodarone .  Still under IVC commitment as per psychiatry. 8/3.  Patient again demanding to go home today.   Still under IVC for commitment.  Agreeable to IM B12 shot. 8/4.  Patient a little more agitated. 8/5.  Patient happy about IVC rescinded and he will go home tomorrow.  Heart rate went up to 144 with physical therapy today.      Assessment and Plan: Acute delirium Secondary to urinary infection.  Patient on Seroquel  and Depakote .  Psychiatry team following and they released involuntary commitment.  Patient has improved since he is coming in but still concerned about his safety going home.  Refuses rehab.  Will set up home health and APS referral.  Urinary tract infection due to extended-spectrum beta lactamase (ESBL) producing Escherichia coli Rocephin  switched over to Zosyn  on 7/30.  Patient denies that he even has a urinary tract infection.  Zosyn  stopped on 8/5.  Dose of oral fosfomycin given today.  Paroxysmal atrial fibrillation (HCC) With heart rate going up to 144 with ambulation with physical therapy will restart low-dose Toprol -XL 12.5 mg daily and increase amiodarone  to 400 mg twice daily.  Heart rate at rest 90.  AKI (acute kidney injury) (HCC) Improved from 1.32 down to 0.95.  Vitamin B12 deficiency B12 level 137.  Supplementing oral B12.  Patient agreeable to B12 injection on 8/3.  Hard of hearing Have to yell in his ear in order for him to hear.  Controlled type 2 diabetes mellitus without complication, without long-term current use of insulin  [facility]) Patient now with hyperglycemia.  Hemoglobin A1c 7.0.  Patient on sliding scale insulin  here.  Added  Glucotrol  XL low-dose  History of bipolar disorder Psychiatry following.  Patient on Seroquel  and Depakote .  Parkinsonism (HCC) Continue Sinemet   Hyponatremia Sodium 1 point less than the normal range.  History of abdominal hernia Observe     Subjective: Patient happy to go home tomorrow.  Refuses rehab.  IVC rescinded today.  Admitted with confusion and delirium and found to have a drug-resistant UTI.  Physical  Exam: Vitals:   03/17/24 1954 03/18/24 0402 03/18/24 0723 03/18/24 1527  BP: (!) 99/59 106/62 120/82 105/71  Pulse: 77 80 90 90  Resp: 18 18 17 18   Temp: 98.2 F (36.8 C) 98.2 F (36.8 C) 98.1 F (36.7 C) 98.2 F (36.8 C)  TempSrc:  Oral Oral   SpO2: 97% 96% 96% 97%  Weight:      Height:       Physical Exam HENT:     Head: Normocephalic.  Eyes:     General: Lids are normal.     Conjunctiva/sclera: Conjunctivae normal.  Cardiovascular:     Rate and Rhythm: Normal rate. Rhythm irregularly irregular.     Heart sounds: Normal heart sounds, S1 normal and S2 normal.  Pulmonary:     Breath sounds: No decreased breath sounds, wheezing, rhonchi or rales.  Abdominal:     Palpations: Abdomen is soft.     Tenderness: There is no abdominal tenderness.     Hernia: A hernia is present. Hernia is present in the ventral area.  Musculoskeletal:     Right lower leg: No swelling.     Left lower leg: No swelling.  Skin:    General: Skin is warm.     Findings: No rash.  Neurological:     Mental Status: He is alert.     Comments: Patient is still hard of hearing.  Patient happy that he can go home tomorrow.     Data Reviewed: Creatinine 0.95, sodium 134, hemoglobin 13.4  Family Communication: Spoke with sister twice on the phone  Disposition: Status is: Inpatient Remains inpatient appropriate because: IVC rescinded this afternoon.  With heart rate going up restart low-dose Toprol -XL and increase amiodarone  to 400 mg twice a day  Planned Discharge Destination: Home with Home Health    Time spent: 28 minutes  Author: [name], MD 03/18/2024 4:35 PM  For on call review [email or web address].

## 2024-03-18 NOTE — TOC Progression Note (Addendum)
 Transition of Care [facility]) - Progression Note    Patient Details  Name: Rick [name] MRN: [id number] Date of Birth: 1941-03-08  Transition of Care [facility]) CM/SW Contact  Naylee Frankowski  [place], [place] Phone Number: 03/18/2024, 3:58 PM  Clinical Narrative:   LCSWA reached out to the Ex wife pamela, and was given the number to contact the patients son Venetia. Left a voice message to call back. Sharlet stated son is a drug addict and has a court appointed guardian   I don't think he will not be a safe family member to be with him at DC. Since he is refusing SNF he may have to go home with HHPT OT, and SW and file a APS report. TOC to give choice of HHPT OT to patient and set up [facility].  TOC to follow for discharge.                       Expected Discharge Plan and Services                                               Social Drivers of Health (SDOH) Interventions SDOH Screenings   Transportation Needs: No Transportation Needs (07/17/2022)  Depression (PHQ2-9): Low Risk  (10/26/2021)  Financial Resource Strain: High Risk (07/17/2022)  Tobacco Use: Medium Risk (03/10/2024)    Readmission Risk Interventions     No data to display

## 2024-03-18 NOTE — Discharge Instructions (Signed)
 No driving

## 2024-03-18 NOTE — Progress Notes (Signed)
 Physical Therapy Treatment Patient Details Name: Rick [name] MRN: [id number] DOB: 1940/11/15 Today's Date: 03/18/2024   History of Present Illness Pt is an 83 y.o. male admitted with AMS. Per chart, pt found at [facility] with bizzare behaviors and police were called, brought to [facility] ED due to EOB. PMH significant for parkinsonism on carbidopa  levodopa , HTN, diabetes. Patient is acutely confused and very hard of hearing.    PT Comments  Pt agrees with encouragement.  To EOB with mod a x 1 with care not to touch B hands/wrist area.  Assist given at shoulders and upper arms.  Once sitting he is stable.  Stands with min a x 1 and is able to walk 60' with RW and min a x 1 with very close guarding for balance and impulsivity.  He self selects walking distance.  Continues with Parkinsons gait and freezing but does have increased gait speed today and better quality steps than past days.  Pt remains at very high risk for falls and unable to walk unassisted given balance, gait and safety deficits.  Of note HR 144 with gait.     If plan is discharge home, recommend the following: Assistance with cooking/housework;Supervision due to cognitive status;Assist for transportation;Help with stairs or ramp for entrance;A little help with walking and/or transfers;A little help with bathing/dressing/bathroom   Can travel by private vehicle        Equipment Recommendations       Recommendations for Other Services       Precautions / Restrictions Precautions Precautions: Fall Recall of Precautions/Restrictions: Impaired Restrictions Weight Bearing Restrictions Per Provider Order: No     Mobility  Bed Mobility Overal bed mobility: Needs Assistance Bed Mobility: Supine to Sit     Supine to sit: Mod assist Sit to supine: Min assist     Patient Response: Anxious, Impulsive  Transfers Overall transfer level: Needs assistance Equipment used: Rolling walker (2 wheels) Transfers: Sit to/from  Stand Sit to Stand: Min assist                Ambulation/Gait Ambulation/Gait assistance: Min assist Gait Distance (Feet): 60 Feet Assistive device: Rolling walker (2 wheels) Gait Pattern/deviations: Step-through pattern, Decreased step length - right, Decreased step length - left, Festinating, Shuffle Gait velocity: dec     General Gait Details: often freezing and needing time to restart.  he is able to take some good steps then freezing limits gait   Stairs             Wheelchair Mobility     Tilt Bed Tilt Bed Patient Response: Anxious, Impulsive  Modified Rankin (Stroke Patients Only)       Balance Overall balance assessment: Needs assistance Sitting-balance support: Feet supported Sitting balance-Leahy Scale: Fair     Standing balance support: Bilateral upper extremity supported, During functional activity Standing balance-Leahy Scale: Poor Standing balance comment: very high fall risk without assist                            Communication Communication Communication: Impaired Factors Affecting Communication: Hearing impaired  Cognition Arousal: Alert Behavior During Therapy: Agitated   PT - Cognitive impairments: No family/caregiver present to determine baseline                       PT - Cognition Comments: yells our frequently and generally gruff but overall able to particiapte with direction.  will yell then  laugh Following commands: Impaired Following commands impaired: Follows one step commands inconsistently    Cueing Cueing Techniques: Verbal cues, Gestural cues, Tactile cues, Visual cues  Exercises      General Comments        Pertinent Vitals/Pain Pain Assessment Pain Assessment: Faces Faces Pain Scale: Hurts even more Pain Location: when touching hands and lower arms.  prefers assist by upper arms Pain Descriptors / Indicators: Sore, Grimacing    Home Living                          Prior  Function            PT Goals (current goals can now be found in the care plan section) Progress towards PT goals: Progressing toward goals    Frequency    Min 2X/week      PT Plan      Co-evaluation              AM-PAC PT 6 Clicks Mobility   Outcome Measure  Help needed turning from your back to your side while in a flat bed without using bedrails?: A Little Help needed moving from lying on your back to sitting on the side of a flat bed without using bedrails?: A Lot Help needed moving to and from a bed to a chair (including a wheelchair)?: A Little Help needed standing up from a chair using your arms (e.g., wheelchair or bedside chair)?: A Little Help needed to walk in hospital room?: A Lot Help needed climbing 3-5 steps with a railing? : Total 6 Click Score: 14    End of Session Equipment Utilized During Treatment: Gait belt Activity Tolerance: Patient tolerated treatment well Patient left: in bed;with call bell/phone within reach;with bed alarm set;with nursing/sitter in room Nurse Communication: Mobility status PT Visit Diagnosis: Difficulty in walking, not elsewhere classified (R26.2)     Time: 1010-1018 PT Time Calculation (min) (ACUTE ONLY): 8 min  Charges:    $Gait Training: 8-22 mins PT General Charges $$ ACUTE PT VISIT: 1 Visit                   Lauraine Gills, PTA 03/18/24, 10:33 AM

## 2024-03-18 NOTE — Plan of Care (Signed)
 Pt is alert and oriented x 3. Agitated during overnight due to afraid he will loose his home.  Problem: Education: Goal: Ability to describe self-care measures that may prevent or decrease complications (Diabetes Survival Skills Education) will improve Outcome: Progressing   Problem: Coping: Goal: Ability to adjust to condition or change in health will improve Outcome: Progressing   Problem: Fluid Volume: Goal: Ability to maintain a balanced intake and output will improve Outcome: Progressing   Problem: Health Behavior/Discharge Planning: Goal: Ability to identify and utilize available resources and services will improve Outcome: Progressing Goal: Ability to manage health-related needs will improve Outcome: Progressing   Problem: Metabolic: Goal: Ability to maintain appropriate glucose levels will improve Outcome: Progressing   Problem: Nutritional: Goal: Maintenance of adequate nutrition will improve Outcome: Progressing Goal: Progress toward achieving an optimal weight will improve Outcome: Progressing   Problem: Skin Integrity: Goal: Risk for impaired skin integrity will decrease Outcome: Progressing   Problem: Tissue Perfusion: Goal: Adequacy of tissue perfusion will improve Outcome: Progressing   Problem: Education: Goal: Knowledge of General Education information will improve Description: Including pain rating scale, medication(s)/side effects and non-pharmacologic comfort measures Outcome: Progressing   Problem: Health Behavior/Discharge Planning: Goal: Ability to manage health-related needs will improve Outcome: Progressing   Problem: Clinical Measurements: Goal: Ability to maintain clinical measurements within normal limits will improve Outcome: Progressing Goal: Will remain free from infection Outcome: Progressing Goal: Diagnostic test results will improve Outcome: Progressing Goal: Respiratory complications will improve Outcome: Progressing Goal:  Cardiovascular complication will be avoided Outcome: Progressing   Problem: Activity: Goal: Risk for activity intolerance will decrease Outcome: Progressing   Problem: Nutrition: Goal: Adequate nutrition will be maintained Outcome: Progressing   Problem: Coping: Goal: Level of anxiety will decrease Outcome: Progressing   Problem: Elimination: Goal: Will not experience complications related to bowel motility Outcome: Progressing Goal: Will not experience complications related to urinary retention Outcome: Progressing   Problem: Pain Managment: Goal: General experience of comfort will improve and/or be controlled Outcome: Progressing   Problem: Safety: Goal: Ability to remain free from injury will improve Outcome: Progressing   Problem: Skin Integrity: Goal: Risk for impaired skin integrity will decrease Outcome: Progressing

## 2024-03-19 DIAGNOSIS — N179 Acute kidney failure, unspecified: Secondary | ICD-10-CM | POA: Diagnosis not present

## 2024-03-19 DIAGNOSIS — R410 Disorientation, unspecified: Secondary | ICD-10-CM | POA: Diagnosis not present

## 2024-03-19 DIAGNOSIS — I480 Paroxysmal atrial fibrillation: Secondary | ICD-10-CM | POA: Diagnosis not present

## 2024-03-19 DIAGNOSIS — N390 Urinary tract infection, site not specified: Secondary | ICD-10-CM | POA: Diagnosis not present

## 2024-03-19 LAB — GLUCOSE, CAPILLARY
Glucose-Capillary: 152 mg/dL — ABNORMAL HIGH (ref 70–99)
Glucose-Capillary: 225 mg/dL — ABNORMAL HIGH (ref 70–99)

## 2024-03-19 MED ORDER — DICLOFENAC SODIUM 1 % EX GEL: 2.0000 g | Freq: Four times a day (QID) | CUTANEOUS | 0 refills | Status: AC

## 2024-03-19 MED ORDER — CARBIDOPA-LEVODOPA 25-100 MG PO TABS: 1.0000 | ORAL_TABLET | Freq: Three times a day (TID) | ORAL | 0 refills | Status: AC

## 2024-03-19 MED ORDER — QUETIAPINE FUMARATE 25 MG PO TABS: 25.0000 mg | ORAL_TABLET | Freq: Every day | ORAL | 0 refills | Status: DC

## 2024-03-19 MED ORDER — METOPROLOL SUCCINATE ER 25 MG PO TB24: 12.5000 mg | ORAL_TABLET | Freq: Every day | ORAL | 0 refills | Status: DC

## 2024-03-19 MED ORDER — ALBUTEROL SULFATE HFA 108 (90 BASE) MCG/ACT IN AERS: 2.0000 | INHALATION_SPRAY | Freq: Four times a day (QID) | RESPIRATORY_TRACT | 0 refills | Status: AC | PRN

## 2024-03-19 MED ORDER — CYANOCOBALAMIN 1000 MCG PO TABS: 1000.0000 ug | ORAL_TABLET | Freq: Every day | ORAL | 0 refills | Status: AC

## 2024-03-19 MED ORDER — GLIPIZIDE ER 2.5 MG PO TB24
2.5000 mg | ORAL_TABLET | Freq: Every day | ORAL | 0 refills | Status: DC
Start: 2024-03-19 — End: 2024-05-03

## 2024-03-19 MED ORDER — DIVALPROEX SODIUM ER 500 MG PO TB24: 500.0000 mg | ORAL_TABLET | Freq: Every day | ORAL | 0 refills | Status: DC

## 2024-03-19 MED ORDER — AMIODARONE HCL 400 MG PO TABS: ORAL_TABLET | ORAL | 0 refills | Status: AC

## 2024-03-19 NOTE — Discharge Summary (Signed)
 Physician Discharge Summary   Patient: Rick [name] MRN: [id number] DOB: 23-May-1941  Admit date:     03/10/2024  Discharge date: 03/19/24  Discharge Physician: [name]   PCP: [name], [name], MD  Recommendations at discharge:   Follow-up PCP 5 days  Discharge Diagnoses: Active Problems:   Acute delirium   Urinary tract infection due to extended-spectrum beta lactamase (ESBL) producing Escherichia coli   Paroxysmal atrial fibrillation (HCC)   AKI (acute kidney injury) (HCC)   Vitamin B12 deficiency   Controlled type 2 diabetes mellitus without complication, without long-term current use of insulin  (HCC)   Hard of hearing   Parkinsonism (HCC)   History of bipolar disorder   History of abdominal hernia   Hyponatremia    Hospital Course: 83 y.o. male with medical history significant of medication induced parkinsonism on carbidopa  levodopa , hypertension, diabetes.  Patient is acutely confused and very hard of hearing.  The following information was obtained from chart review and ER report: The patient was found at [facility] today and police were called due to patient reportedly demonstrating bizarre behaviors.  Patient then got in car and attempted to drive and was stopped by 088.  While waiting in police car patient became short of breath which police report may have been secondary to heat.  EMS was called.  On arrival to the ED patient was agitated and confused.  He required multiple rounds of Versed .  He remains encephalopathic and requesting to discharge home.  ED physician placed IVC for patient. Labs are mostly unremarkable.  UA consistent with possible UTI.  Patient has been started on ceftriaxone . Reported rhythm on arrival is A-fib RVR.  Presently he is tremulous with significant artifact, but appears to be NSR in the 80s   When asking patient how he got here he has difficulty answering the question and continues to repeat that he would like to go home.    7/30.  Patient asking to go home.  Patient under involuntary commitment.  Brought in with acute metabolic encephalopathy.  ESBL E. coli growing out of urine culture.  Antibiotics switched over to Zosyn . 7/31.  Continue Zosyn .  Patient tried to kick me because he was agitated that I would not let him go home 8/1.  Continue Zosyn .  Patient calmer today and able to have conversation. 8/2.  Patient calm today and answers questions.  Blood pressure little on the lower side will switch Toprol  over to amiodarone .  Still under IVC commitment as per psychiatry. 8/3.  Patient again demanding to go home today.  Still under IVC for commitment.  Agreeable to IM B12 shot. 8/4.  Patient a little more agitated. 8/5.  Patient happy about IVC rescinded and he will go home tomorrow.  Heart rate went up to 144 with physical therapy today. 8/6.  Discharge home with home health.  APS referral.      Assessment and Plan: Acute delirium Secondary to urinary infection.  Patient on Seroquel  and Depakote .  Psychiatry team following and they released involuntary commitment on 8/5.[name]  Patient has improved since he is coming in but still concerned about his safety going home.  Refuses rehab.  Will set up home health and APS referral.  Urinary tract infection due to extended-spectrum beta lactamase (ESBL) producing Escherichia coli Rocephin  switched over to Zosyn  on 7/30.  Patient denies that he even has a urinary tract infection.  Zosyn  stopped on 8/5.  Dose of oral fosfomycin given 8/5 to complete antibiotics.  Paroxysmal atrial fibrillation (HCC) With heart rate going up to 144 with ambulation yesterday with physical therapy will restart low-dose Toprol -XL 12.5 mg daily and increase amiodarone  to 400 mg twice daily for 6 days then 200 mg twice a day for 14 days then 200 mg daily after that.  Unable to talk to him about blood thinner at this time.  Patient is very hard of hearing and I am concerned about starting a blood  thinner if he is unable to understand what I am talking to him about.  Patient also has unsteady gait and high risk of falls I will hold off on blood thinner at this time.  AKI (acute kidney injury) (HCC) Improved from 1.32 down to 0.95.  Vitamin B12 deficiency B12 level 137.  Supplementing oral B12.  Patient agreeable to B12 injection on 8/3.  Hard of hearing Have to yell in his ear in order for him to hear.  Controlled type 2 diabetes mellitus without complication, without long-term current use of insulin  [facility]) Patient now with hyperglycemia.  Hemoglobin A1c 7.0.  Patient on sliding scale insulin  here.  Added Glucotrol  XL low-dose  History of bipolar disorder Psychiatry following.  Patient on Seroquel  and Depakote .  Parkinsonism (HCC) Continue Sinemet   Hyponatremia Sodium 1 point less than the normal range.  History of abdominal hernia Observe         Consultants: Psychiatry Procedures performed: None Disposition: Home health (refuses rehab) Diet recommendation:  Cardiac and Carb modified diet DISCHARGE MEDICATION: Allergies as of 03/19/2024       Reactions   Blood-group Specific Substance    Haloperidol Other (See Comments)   Unknown   Sulfa Antibiotics Other (See Comments)   Unknown        Medication List     STOP taking these medications    Basaglar  KwikPen 100 UNIT/ML   BD Pen Needle Nano U/F 32G X 4 MM Misc Generic drug: Insulin  Pen Needle   BENGAY EX   benzonatate  100 MG capsule Commonly known as: TESSALON    carvedilol  6.25 MG tablet Commonly known as: COREG    enalapril  10 MG tablet Commonly known as: VASOTEC    fexofenadine  180 MG tablet Commonly known as: Allegra  Allergy   fluticasone  50 MCG/ACT nasal spray Commonly known as: FLONASE    gabapentin  300 MG capsule Commonly known as: NEURONTIN    glipiZIDE  10 MG tablet Commonly known as: GLUCOTROL  Replaced by: glipiZIDE  2.5 MG 24 hr tablet   hydrochlorothiazide  12.5 MG  tablet Commonly known as: HYDRODIURIL    Insulin  Pen Needle 29G X Misc Commonly known as: UltiCare Pen Needles   lovastatin  40 MG tablet Commonly known as: MEVACOR    metFORMIN  500 MG 24 hr tablet Commonly known as: GLUCOPHAGE -XR   traZODone  100 MG tablet Commonly known as: DESYREL        TAKE these medications    albuterol  108 (90 Base) MCG/ACT inhaler Commonly known as: VENTOLIN  HFA Inhale 2 puffs into the lungs every 6 (six) hours as needed for wheezing or shortness of breath.   amiodarone  400 MG tablet Commonly known as: PACERONE  2 tabs po twice a day for 6 days then 1 tab po twice a day for 14 days then one tab po daily afterwards   carbidopa -levodopa  25-100 MG tablet Commonly known as: SINEMET  IR Take 1 tablet by mouth 3 (three) times daily.   cyanocobalamin  1000 MCG tablet Take 1 tablet (1,000 mcg total) by mouth daily.   diclofenac  Sodium 1 % Gel Commonly known as: Voltaren  Apply 2  g topically 4 (four) times daily. What changed:  See the new instructions. Another medication with the same name was removed. Continue taking this medication, and follow the directions you see here.   divalproex  500 MG 24 hr tablet Commonly known as: DEPAKOTE  ER Take 1 tablet (500 mg total) by mouth daily.   glipiZIDE  2.5 MG 24 hr tablet Commonly known as: GLUCOTROL  XL Take 1 tablet (2.5 mg total) by mouth daily with breakfast. Replaces: glipiZIDE  10 MG tablet   metoprolol  succinate 25 MG 24 hr tablet Commonly known as: TOPROL -XL Take 0.5 tablets (12.5 mg total) by mouth daily.   ONE TOUCH ULTRA SYSTEM KIT w/Device Kit 1 kit by Does not apply route once.   OneTouch Ultra test strip Generic drug: glucose blood TEST BLOOD SUGAR THREE TIMES DAILY.   onetouch ultrasoft lancets TEST BLOOD SUGAR THREE TIMES DAILY   QUEtiapine  25 MG tablet Commonly known as: SEROQUEL  Take 1 tablet (25 mg total) by mouth at bedtime.   valACYclovir  1000 MG tablet Commonly known as:  VALTREX  Take 1 tablet (1,000 mg total) by mouth daily as needed.        Follow-up Information     [name], [name], MD. Go on 05/01/2024.   Specialty: Family Medicine Why: Appt @ 10:30 am (Patient will be put on list for earlier appt) Contact information: [address] [place] [address] [phone]                Discharge Exam: Fredricka Weights   03/10/24 1309  Weight: 69.9 kg   Physical Exam HENT:     Head: Normocephalic.  Eyes:     General: Lids are normal.     Conjunctiva/sclera: Conjunctivae normal.  Cardiovascular:     Rate and Rhythm: Normal rate. Rhythm irregularly irregular.     Heart sounds: Normal heart sounds, S1 normal and S2 normal.  Pulmonary:     Breath sounds: No decreased breath sounds, wheezing, rhonchi or rales.  Abdominal:     Palpations: Abdomen is soft.     Tenderness: There is no abdominal tenderness.     Hernia: A hernia is present. Hernia is present in the ventral area.  Musculoskeletal:     Right lower leg: No swelling.     Left lower leg: No swelling.  Skin:    General: Skin is warm.     Findings: No rash.  Neurological:     Mental Status: He is alert.     Comments: Patient is hard of hearing      Condition at discharge: stable  The results of significant diagnostics from this hospitalization (including imaging, microbiology, ancillary and laboratory) are listed below for reference.   Imaging Studies: ECHOCARDIOGRAM COMPLETE Result Date: 03/11/2024    ECHOCARDIOGRAM REPORT   Patient Name:   ZIMIR KITTLESON Date of Exam: 03/11/2024 Medical Rec #:  [id number]       Height:       64.0 in Accession #:    [id number]      Weight:       154.2 lb Date of Birth:  Jan 31, 1941      BSA:          1.752 m Patient Age:    82 years        BP:           112/53 mmHg Patient Gender: M               HR:  98 bpm. Exam Location:  [facility] Procedure: 2D Echo, Cardiac Doppler and Color Doppler (Both Spectral and Color            Flow  Doppler were utilized during procedure). Indications:     Atrial Fibrillation I48.91  History:         Patient has prior history of Echocardiogram examinations, most                  recent 09/28/2015. Risk Factors:Hypertension and Dyslipidemia.  Sonographer:     Christopher Furnace Referring Phys:  [id number] LORANE POLAND Diagnosing Phys: Deatrice Cage MD  Sonographer Comments: Image acquisition challenging due to patient body habitus. IMPRESSIONS  1. Left ventricular ejection fraction, by estimation, is 55 to 60%. The left ventricle has normal function. The left ventricle has no regional wall motion abnormalities. There is mild left ventricular hypertrophy. Left ventricular diastolic parameters are indeterminate.  2. Right ventricular systolic function is normal. The right ventricular size is normal. There is mildly elevated pulmonary artery systolic pressure.  3. Left atrial size was moderately dilated.  4. The mitral valve is normal in structure. No evidence of mitral valve regurgitation. No evidence of mitral stenosis.  5. The aortic valve is normal in structure. Aortic valve regurgitation is not visualized. Aortic valve sclerosis/calcification is present, without any evidence of aortic stenosis. FINDINGS  Left Ventricle: Left ventricular ejection fraction, by estimation, is 55 to 60%. The left ventricle has normal function. The left ventricle has no regional wall motion abnormalities. The left ventricular internal cavity size was normal in size. There is  mild left ventricular hypertrophy. Left ventricular diastolic parameters are indeterminate. Right Ventricle: The right ventricular size is normal. No increase in right ventricular wall thickness. Right ventricular systolic function is normal. There is mildly elevated pulmonary artery systolic pressure. The tricuspid regurgitant velocity is 2.90  m/s, and with an assumed right atrial pressure of 5 mmHg, the estimated right ventricular systolic pressure is 38.6 mmHg.  Left Atrium: Left atrial size was moderately dilated. Right Atrium: Right atrial size was normal in size. Pericardium: There is no evidence of pericardial effusion. Mitral Valve: The mitral valve is normal in structure. There is moderate thickening of the mitral valve leaflet(s). No evidence of mitral valve regurgitation. No evidence of mitral valve stenosis. Tricuspid Valve: The tricuspid valve is normal in structure. Tricuspid valve regurgitation is mild . No evidence of tricuspid stenosis. Aortic Valve: The aortic valve is normal in structure. Aortic valve regurgitation is not visualized. Aortic valve sclerosis/calcification is present, without any evidence of aortic stenosis. Aortic valve mean gradient measures 2.5 mmHg. Aortic valve peak  gradient measures 3.9 mmHg. Aortic valve area, by VTI measures 2.15 cm. Pulmonic Valve: The pulmonic valve was normal in structure. Pulmonic valve regurgitation is not visualized. No evidence of pulmonic stenosis. Aorta: The aortic root is normal in size and structure. Venous: The inferior vena cava was not well visualized. IAS/Shunts: No atrial level shunt detected by color flow Doppler.  LEFT VENTRICLE PLAX 2D LVIDd:         4.60 cm LVIDs:         3.00 cm LV PW:         1.20 cm LV IVS:        1.10 cm LVOT diam:     2.00 cm LV SV:         35 LV SV Index:   20 LVOT Area:     3.14 cm  RIGHT VENTRICLE RV  Basal diam:  2.80 cm RV Mid diam:    2.60 cm LEFT ATRIUM              Index        RIGHT ATRIUM           Index LA diam:        4.80 cm  2.74 cm/m   RA Area:     10.60 cm LA Vol (A2C):   94.5 ml  53.95 ml/m  RA Volume:   20.70 ml  11.82 ml/m LA Vol (A4C):   130.0 ml 74.22 ml/m LA Biplane Vol: 111.0 ml 63.37 ml/m  AORTIC VALVE AV Area (Vmax):    2.09 cm AV Area (Vmean):   2.04 cm AV Area (VTI):     2.15 cm AV Vmax:           98.30 cm/s AV Vmean:          69.700 cm/s AV VTI:            0.161 m AV Peak Grad:      3.9 mmHg AV Mean Grad:      2.5 mmHg LVOT Vmax:          65.50 cm/s LVOT Vmean:        45.300 cm/s LVOT VTI:          0.110 m LVOT/AV VTI ratio: 0.68  AORTA Ao Root diam: 3.30 cm MITRAL VALVE                TRICUSPID VALVE MV Area (PHT): 4.89 cm     TR Peak grad:   33.6 mmHg MV Decel Time: 155 msec     TR Vmax:        290.00 cm/s MV E velocity: 105.00 cm/s                             SHUNTS                             Systemic VTI:  0.11 m                             Systemic Diam: 2.00 cm Deatrice Cage MD Electronically signed by Deatrice Cage MD Signature Date/Time: 03/11/2024/3:21:32 PM    Final    CT Head Wo Contrast Result Date: 03/10/2024 CLINICAL DATA:  Delirium. EXAM: CT HEAD WITHOUT CONTRAST TECHNIQUE: Contiguous axial images were obtained from the base of the skull through the vertex without intravenous contrast. RADIATION DOSE REDUCTION: This exam was performed according to the departmental dose-optimization program which includes automated exposure control, adjustment of the mA and/or kV according to patient size and/or use of iterative reconstruction technique. COMPARISON:  None Available. FINDINGS: Brain: Age-related atrophy and mild-to-moderate periventricular white matter disease. No evidence of hemorrhage, mass, acute cortical infarct or hydrocephalus. Vascular: Moderate vascular calcifications. Skull: Intact and unremarkable. Sinuses/Orbits: Clear paranasal sinuses.  Normal orbits. Other: None. IMPRESSION: 1. Age-related atrophy and mild to moderate cerebral white matter disease. No apparent acute process. Electronically Signed   By: Evalene Coho M.D.   On: 03/10/2024 15:19   DG Chest Port 1 View Result Date: 03/10/2024 CLINICAL DATA:  [id number] SOB (shortness of breath) [id number] EXAM: PORTABLE CHEST - 1 VIEW COMPARISON:  11/02/2020 FINDINGS: Right pleural effusion no longer evident. Improvement in bibasilar airspace opacities seen  previously, minimal residual on the left. Low lung volumes. Heart size and mediastinal contours are within normal  limits. Aortic Atherosclerosis (ICD10-170.0). Visualized bones unremarkable. IMPRESSION: Improved bibasilar airspace disease and right pleural effusion. Electronically Signed   By: JONETTA Faes M.D.   On: 03/10/2024 13:59    Microbiology: Results for orders placed or performed during the hospital encounter of 03/10/24  Urine Culture     Status: Abnormal   Collection Time: 03/10/24  1:43 PM   Specimen: Urine, Clean Catch  Result Value Ref Range Status   Specimen Description   Final    URINE, CLEAN CATCH Performed at [facility], [address], [place], [place] [address]    Special Requests   Final    NONE Performed at [facility], [address] [place], [place] [address]    Culture (A)  Final    >=100,000 COLONIES/mL ESCHERICHIA COLI Confirmed Extended Spectrum Beta-Lactamase Producer (ESBL).  In bloodstream infections from ESBL organisms, carbapenems are preferred over piperacillin /tazobactam. They are shown to have a lower risk of mortality.    Report Status 03/12/2024 FINAL  Final   Organism ID, Bacteria ESCHERICHIA COLI (A)  Final      Susceptibility   Escherichia coli - MIC*    AMPICILLIN >=32 RESISTANT Resistant     CEFAZOLIN  >=64 RESISTANT Resistant     CEFEPIME 16 RESISTANT Resistant     CEFTRIAXONE  >=64 RESISTANT Resistant     CIPROFLOXACIN  >=4 RESISTANT Resistant     GENTAMICIN  <=1 SENSITIVE Sensitive     IMIPENEM <=0.25 SENSITIVE Sensitive     NITROFURANTOIN  32 SENSITIVE Sensitive     TRIMETH/SULFA <=20 SENSITIVE Sensitive     AMPICILLIN/SULBACTAM 16 INTERMEDIATE Intermediate     PIP/TAZO <=4 SENSITIVE Sensitive ug/mL    * >=100,000 COLONIES/mL ESCHERICHIA COLI    Labs: CBC: Recent Labs  Lab 03/13/24 0427 03/14/24 0457 03/15/24 0356  WBC 6.6 8.0 7.9  NEUTROABS 4.3 5.2 4.4  HGB 13.3 13.4 13.4  HCT 40.3 41.3 41.2  MCV 86.9 86.6 85.5  PLT 217 204 213   Basic Metabolic Panel: Recent Labs  Lab 03/13/24 0427 03/14/24 0457  03/15/24 0356  NA 137 134* 134*  K 3.9 4.0 3.9  CL 103 102 102  CO2 25 20* 24  GLUCOSE 154* 177* 149*  BUN 24* 22 24*  CREATININE 0.87 0.90 0.95  CALCIUM 8.9 8.7* 9.0  MG 1.8 1.7 1.8  PHOS 3.3 2.6 3.4   Liver Function Tests: Recent Labs  Lab 03/13/24 0427 03/14/24 0457 03/15/24 0356  AST 14* 17 17  ALT <5 5 <5  ALKPHOS 56 55 48  BILITOT 1.1 0.8 0.6  PROT 7.0 6.8 6.8  ALBUMIN 2.9* 2.8* 2.8*   CBG: Recent Labs  Lab 03/18/24 1130 03/18/24 1642 03/18/24 2220 03/19/24 0737 03/19/24 1117  GLUCAP 167* 163* 163* 152* 225*    Discharge time spent: greater than 30 minutes.  Signed: [name], MD [facility] 03/19/2024

## 2024-03-19 NOTE — Plan of Care (Signed)
  Problem: Education: Goal: Individualized Educational Video(s) Outcome: Progressing   Problem: Coping: Goal: Ability to adjust to condition or change in health will improve Outcome: Progressing   Problem: Fluid Volume: Goal: Ability to maintain a balanced intake and output will improve Outcome: Progressing   Problem: Metabolic: Goal: Ability to maintain appropriate glucose levels will improve Outcome: Progressing   Problem: Nutritional: Goal: Maintenance of adequate nutrition will improve Outcome: Progressing Goal: Progress toward achieving an optimal weight will improve Outcome: Progressing   Problem: Skin Integrity: Goal: Risk for impaired skin integrity will decrease Outcome: Progressing   Problem: Tissue Perfusion: Goal: Adequacy of tissue perfusion will improve Outcome: Progressing   Problem: Clinical Measurements: Goal: Ability to maintain clinical measurements within normal limits will improve Outcome: Progressing Goal: Will remain free from infection Outcome: Progressing Goal: Diagnostic test results will improve Outcome: Progressing Goal: Respiratory complications will improve Outcome: Progressing Goal: Cardiovascular complication will be avoided Outcome: Progressing   Problem: Activity: Goal: Risk for activity intolerance will decrease Outcome: Progressing   Problem: Nutrition: Goal: Adequate nutrition will be maintained Outcome: Progressing   Problem: Elimination: Goal: Will not experience complications related to bowel motility Outcome: Progressing Goal: Will not experience complications related to urinary retention Outcome: Progressing   Problem: Pain Managment: Goal: General experience of comfort will improve and/or be controlled Outcome: Progressing   Problem: Safety: Goal: Ability to remain free from injury will improve Outcome: Progressing   Problem: Skin Integrity: Goal: Risk for impaired skin integrity will decrease Outcome:  Progressing

## 2024-03-19 NOTE — TOC Transition Note (Signed)
 Transition of Care [facility]) - Discharge Note   Patient Details  Name: Rick [name] MRN: [id number] Date of Birth: 12-12-1940  [facility]) CM/SW Contact:  Alvaro Louder, LCSW Phone Number: 03/19/2024, 12:39 PM   Clinical Narrative:   LCSWA spoke to [facility] Admission Coordinator to start patient services and confirmed services for patient LCSWA met with patient at bedside to discuss HHPT OT, RN, and SW through [facility]. Patient was agreeable. Patient indicated that his Son will be coming to pick him up.   TOC signing off     Final next level of care: Home w Home Health Services Barriers to Discharge: No Barriers Identified   Patient Goals and CMS Choice            Discharge Placement              Patient chooses bed at:  (Home) Patient to be transferred to facility by: Son Name of family member notified: Self Patient and family notified of of transfer: 03/19/24  Discharge Plan and Services Additional resources added to the After Visit Summary for                            [facility] Arranged: PT, OT, RN, Social Work [facility] Agency: [facility] Date [facility] Agency Contacted: 03/19/24   Representative spoke with at [facility] Agency: [place]   Social Drivers of Health (SDOH) Interventions SDOH Screenings   Transportation Needs: No Transportation Needs (07/17/2022)  Depression (PHQ2-9): Low Risk  (10/26/2021)  Financial Resource Strain: High Risk (07/17/2022)  Tobacco Use: Medium Risk (03/10/2024)     Readmission Risk Interventions     No data to display

## 2024-03-20 ENCOUNTER — Other Ambulatory Visit: Payer: Self-pay

## 2024-03-20 ENCOUNTER — Observation Stay
Admission: EM | Admit: 2024-03-20 | Discharge: 2024-05-03 | Disposition: A | Discharge status: Skilled Nursing Facility | Attending: Internal Medicine | Admitting: Internal Medicine

## 2024-03-20 DIAGNOSIS — G9341 Metabolic encephalopathy: Secondary | ICD-10-CM | POA: Diagnosis not present

## 2024-03-20 DIAGNOSIS — Z794 Long term (current) use of insulin: Secondary | ICD-10-CM | POA: Insufficient documentation

## 2024-03-20 DIAGNOSIS — N390 Urinary tract infection, site not specified: Secondary | ICD-10-CM | POA: Insufficient documentation

## 2024-03-20 DIAGNOSIS — E1142 Type 2 diabetes mellitus with diabetic polyneuropathy: Secondary | ICD-10-CM | POA: Diagnosis not present

## 2024-03-20 DIAGNOSIS — I480 Paroxysmal atrial fibrillation: Secondary | ICD-10-CM | POA: Insufficient documentation

## 2024-03-20 DIAGNOSIS — D519 Vitamin B12 deficiency anemia, unspecified: Secondary | ICD-10-CM | POA: Diagnosis not present

## 2024-03-20 DIAGNOSIS — Z79899 Other long term (current) drug therapy: Secondary | ICD-10-CM | POA: Diagnosis not present

## 2024-03-20 DIAGNOSIS — Z87891 Personal history of nicotine dependence: Secondary | ICD-10-CM | POA: Diagnosis not present

## 2024-03-20 DIAGNOSIS — R456 Violent behavior: Secondary | ICD-10-CM | POA: Diagnosis present

## 2024-03-20 DIAGNOSIS — G20C Parkinsonism, unspecified: Secondary | ICD-10-CM | POA: Insufficient documentation

## 2024-03-20 DIAGNOSIS — I10 Essential (primary) hypertension: Secondary | ICD-10-CM | POA: Insufficient documentation

## 2024-03-20 DIAGNOSIS — R4689 Other symptoms and signs involving appearance and behavior: Principal | ICD-10-CM

## 2024-03-20 LAB — CBC
HCT: 37 % — ABNORMAL LOW (ref 39.0–52.0)
Hemoglobin: 12.2 g/dL — ABNORMAL LOW (ref 13.0–17.0)
MCH: 28.6 pg (ref 26.0–34.0)
MCHC: 33 g/dL (ref 30.0–36.0)
MCV: 86.7 fL (ref 80.0–100.0)
Platelets: 255 K/uL (ref 150–400)
RBC: 4.27 MIL/uL (ref 4.22–5.81)
RDW: 14.1 % (ref 11.5–15.5)
WBC: 9.9 K/uL (ref 4.0–10.5)
nRBC: 0 % (ref 0.0–0.2)

## 2024-03-20 LAB — COMPREHENSIVE METABOLIC PANEL WITH GFR
ALT: 13 U/L (ref 0–44)
AST: 19 U/L (ref 15–41)
Albumin: 2.9 g/dL — ABNORMAL LOW (ref 3.5–5.0)
Alkaline Phosphatase: 43 U/L (ref 38–126)
Anion gap: 11 (ref 5–15)
BUN: 33 mg/dL — ABNORMAL HIGH (ref 8–23)
CO2: 21 mmol/L — ABNORMAL LOW (ref 22–32)
Calcium: 9.1 mg/dL (ref 8.9–10.3)
Chloride: 105 mmol/L (ref 98–111)
Creatinine, Ser: 1.39 mg/dL — ABNORMAL HIGH (ref 0.61–1.24)
GFR, Estimated: 51 mL/min — ABNORMAL LOW (ref >60)
Glucose, Bld: 135 mg/dL — ABNORMAL HIGH (ref 70–99)
Potassium: 3.7 mmol/L (ref 3.5–5.1)
Sodium: 137 mmol/L (ref 135–145)
Total Bilirubin: 0.6 mg/dL (ref 0.0–1.2)
Total Protein: 6.5 g/dL (ref 6.5–8.1)

## 2024-03-20 LAB — ETHANOL: Alcohol, Ethyl (B): <15 mg/dL (ref <15)

## 2024-03-20 LAB — URINE DRUG SCREEN, QUALITATIVE (ARMC ONLY)
Amphetamines, Ur Screen: NOT DETECTED
Barbiturates, Ur Screen: NOT DETECTED
Benzodiazepine, Ur Scrn: NOT DETECTED
Cannabinoid 50 Ng, Ur ~~LOC~~: NOT DETECTED
Cocaine Metabolite,Ur ~~LOC~~: NOT DETECTED
MDMA (Ecstasy)Ur Screen: NOT DETECTED
Methadone Scn, Ur: NOT DETECTED
Opiate, Ur Screen: NOT DETECTED
Phencyclidine (PCP) Ur S: NOT DETECTED
Tricyclic, Ur Screen: NOT DETECTED

## 2024-03-20 LAB — SALICYLATE LEVEL: Salicylate Lvl: <7 mg/dL — ABNORMAL LOW (ref 7.0–30.0)

## 2024-03-20 LAB — ACETAMINOPHEN LEVEL: Acetaminophen (Tylenol), Serum: <10 ug/mL — ABNORMAL LOW (ref 10–30)

## 2024-03-20 MED ORDER — METOPROLOL SUCCINATE ER 25 MG PO TB24
12.5000 mg | ORAL_TABLET | Freq: Every day | ORAL | Status: DC
  Administered 2024-03-21 – 2024-04-01 (×15): 12.5 mg via ORAL
  Filled 2024-03-20 (×12): qty 1

## 2024-03-20 MED ORDER — ALBUTEROL SULFATE HFA 108 (90 BASE) MCG/ACT IN AERS: 2.0000 | INHALATION_SPRAY | Freq: Four times a day (QID) | RESPIRATORY_TRACT | Status: DC | PRN

## 2024-03-20 MED ORDER — DIPHENHYDRAMINE HCL 50 MG/ML IJ SOLN
50.0000 mg | Freq: Once | INTRAMUSCULAR | Status: AC
  Administered 2024-03-20: 50 mg via INTRAMUSCULAR
  Filled 2024-03-20: qty 1

## 2024-03-20 MED ORDER — AMIODARONE HCL 200 MG PO TABS
400.0000 mg | ORAL_TABLET | Freq: Two times a day (BID) | ORAL | Status: AC
  Administered 2024-03-21 – 2024-03-26 (×17): 400 mg via ORAL
  Filled 2024-03-20 (×12): qty 2

## 2024-03-20 MED ORDER — GLIPIZIDE ER 2.5 MG PO TB24
2.5000 mg | ORAL_TABLET | Freq: Every day | ORAL | Status: DC
  Administered 2024-03-21 – 2024-04-01 (×15): 2.5 mg via ORAL
  Filled 2024-03-20 (×12): qty 1

## 2024-03-20 MED ORDER — AMIODARONE HCL 200 MG PO TABS
200.0000 mg | ORAL_TABLET | Freq: Every day | ORAL | Status: DC
  Administered 2024-04-09 – 2024-05-03 (×25): 200 mg via ORAL
  Filled 2024-03-20 (×25): qty 1

## 2024-03-20 MED ORDER — QUETIAPINE FUMARATE 25 MG PO TABS
25.0000 mg | ORAL_TABLET | Freq: Every day | ORAL | Status: DC
  Administered 2024-03-21 – 2024-03-28 (×12): 25 mg via ORAL
  Filled 2024-03-20 (×9): qty 1

## 2024-03-20 MED ORDER — LORAZEPAM 2 MG/ML IJ SOLN
2.0000 mg | Freq: Once | INTRAMUSCULAR | Status: AC
  Administered 2024-03-20: 2 mg via INTRAMUSCULAR
  Filled 2024-03-20: qty 1

## 2024-03-20 MED ORDER — AMIODARONE HCL 200 MG PO TABS
200.0000 mg | ORAL_TABLET | Freq: Two times a day (BID) | ORAL | Status: AC
  Administered 2024-03-26 – 2024-04-09 (×29): 200 mg via ORAL
  Filled 2024-03-20 (×28): qty 1

## 2024-03-20 MED ORDER — DIVALPROEX SODIUM ER 500 MG PO TB24
500.0000 mg | ORAL_TABLET | Freq: Every day | ORAL | Status: DC
  Administered 2024-03-21 – 2024-04-03 (×17): 500 mg via ORAL
  Filled 2024-03-20 (×14): qty 1

## 2024-03-20 MED ORDER — CARBIDOPA-LEVODOPA 25-100 MG PO TABS
1.0000 | ORAL_TABLET | Freq: Three times a day (TID) | ORAL | Status: DC
  Administered 2024-03-21 – 2024-05-03 (×139): 1 via ORAL
  Filled 2024-03-20 (×131): qty 1

## 2024-03-20 NOTE — ED Notes (Signed)
 Pt refused blood work, ua and dressing out.  Dr bradler aware.  Meds ordered and given ,.

## 2024-03-20 NOTE — ED Notes (Signed)
 This rn attempted to dress out pt with tech.  Pt yelled out and would not move or help with getting undressed at this time.  Pt sleepy.

## 2024-03-20 NOTE — BH Assessment (Signed)
 This Clinical research associate contacted [facility] via phone to request an assessment for this patient, request has been made, assessment is currently pending.

## 2024-03-20 NOTE — ED Notes (Signed)
 Labs drawn.  Pt sleeping  pt is NOT dressed out.
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
## 2024-03-20 NOTE — ED Provider Notes (Signed)
 [facility] Provider Note   Event Date/Time   First MD Initiated Contact with Patient 03/20/24 1918     (approximate) History  Psychiatric Evaluation  HPI Rick [name] is a 83 y.o. male with a past medical history of hyperlipidemia, hypertension, bipolar disorder, type 2 diabetes, anxiety/depression, and insomnia who presents via IVC by law enforcement with paperwork stating patient was driving erratically in his car.  Patient was reportedly agitated and uncooperative with law enforcement who placed him under IVC.  Patient refusing to answer questions at this time ROS: Unable to assess   Physical Exam  Triage Vital Signs: ED Triage Vitals  Encounter Vitals Group     BP 03/20/24 1906 124/76     Girls Systolic BP Percentile --      Girls Diastolic BP Percentile --      Boys Systolic BP Percentile --      Boys Diastolic BP Percentile --      Pulse Rate 03/20/24 1906 96     Resp 03/20/24 1906 16     Temp 03/20/24 1906 97.8 F (36.6 C)     Temp Source 03/20/24 1906 Oral     SpO2 03/20/24 1906 100 %     Weight 03/20/24 1914 175 lb (79.4 kg)     Height 03/20/24 1914 5' 5 (1.651 m)     Head Circumference --      Peak Flow --      Pain Score 03/20/24 1914 0     Pain Loc --      Pain Education --      Exclude from Growth Chart --    Most recent vital signs: Vitals:   03/20/24 1906  BP: 124/76  Pulse: 96  Resp: 16  Temp: 97.8 F (36.6 C)  SpO2: 100%   General: Awake, oriented x4. CV:  Good peripheral perfusion. Resp:  Normal effort. Abd:  No distention. Other:  Elderly overweight Caucasian male resting comfortably in no acute distress ED Results / Procedures / Treatments  Labs (all labs ordered are listed, but only abnormal results are displayed) Labs Reviewed  URINE DRUG SCREEN, QUALITATIVE ([facility] ONLY)  COMPREHENSIVE METABOLIC PANEL WITH GFR  ETHANOL  CBC  SALICYLATE LEVEL  ACETAMINOPHEN  LEVEL   PROCEDURES: Critical Care performed:  No Procedures MEDICATIONS ORDERED IN ED: Medications  diphenhydrAMINE  (BENADRYL ) injection 50 mg (50 mg Intramuscular Given 03/20/24 1948)  LORazepam  (ATIVAN ) injection 2 mg (2 mg Intramuscular Given 03/20/24 1948)   IMPRESSION / MDM / ASSESSMENT AND PLAN / ED COURSE  I reviewed the triage vital signs and the nursing notes.                             The patient is on the cardiac monitor to evaluate for evidence of arrhythmia and/or significant heart rate changes. Patient's presentation is most consistent with acute presentation with potential threat to life or bodily function. Patient presents under IVC for aggressive behavior. Thoughts are disorganized. No history of prior suicide attempt, and no SI or HI at this time. Clinically w/ no overt toxidrome, low suspicion for ingestion given hx and exam Thoughts unlikely 2/2 anemia, hypothyroidism, infection, or ICH. Patient's decision making capacity is compromised and they are unable to perform all ADL's (additionally they are without appropriate caretakers to assist through this deficit).  Consult: Psychiatry to evaluate patient for grave disability Disposition: Pending psychiatric evaluation  Care of this patient will be  signed out the oncoming physician.  All pertinent patient formation is conveyed and all questions answered.  All further care and disposition decisions will be made by the oncoming physician.   FINAL CLINICAL IMPRESSION(S) / ED DIAGNOSES   Final diagnoses:  Aggressive behavior   Rx / DC Orders   ED Discharge Orders     None      Note:  This document was prepared using Dragon voice recognition software and may include unintentional dictation errors.   Konni Kesinger K, MD 03/20/24 2108

## 2024-03-21 DIAGNOSIS — E1142 Type 2 diabetes mellitus with diabetic polyneuropathy: Secondary | ICD-10-CM | POA: Diagnosis not present

## 2024-03-21 DIAGNOSIS — F05 Delirium due to known physiological condition: Secondary | ICD-10-CM

## 2024-03-21 DIAGNOSIS — I480 Paroxysmal atrial fibrillation: Secondary | ICD-10-CM

## 2024-03-21 DIAGNOSIS — G20C Parkinsonism, unspecified: Secondary | ICD-10-CM

## 2024-03-21 DIAGNOSIS — G9341 Metabolic encephalopathy: Secondary | ICD-10-CM

## 2024-03-21 DIAGNOSIS — N390 Urinary tract infection, site not specified: Secondary | ICD-10-CM

## 2024-03-21 LAB — URINALYSIS, W/ REFLEX TO CULTURE (INFECTION SUSPECTED)
Bilirubin Urine: NEGATIVE
Glucose, UA: 50 mg/dL — AB
Hgb urine dipstick: NEGATIVE
Ketones, ur: NEGATIVE mg/dL
Nitrite: NEGATIVE
Protein, ur: 30 mg/dL — AB
Specific Gravity, Urine: 1.017 (ref 1.005–1.030)
pH: 5 (ref 5.0–8.0)

## 2024-03-21 LAB — CBC
HCT: 39 % (ref 39.0–52.0)
Hemoglobin: 12.5 g/dL — ABNORMAL LOW (ref 13.0–17.0)
MCH: 27.8 pg (ref 26.0–34.0)
MCHC: 32.1 g/dL (ref 30.0–36.0)
MCV: 86.9 fL (ref 80.0–100.0)
Platelets: 246 K/uL (ref 150–400)
RBC: 4.49 MIL/uL (ref 4.22–5.81)
RDW: 14.2 % (ref 11.5–15.5)
WBC: 7.2 K/uL (ref 4.0–10.5)
nRBC: 0 % (ref 0.0–0.2)

## 2024-03-21 LAB — GLUCOSE, CAPILLARY
Glucose-Capillary: 145 mg/dL — ABNORMAL HIGH (ref 70–99)
Glucose-Capillary: 151 mg/dL — ABNORMAL HIGH (ref 70–99)
Glucose-Capillary: 148 mg/dL — ABNORMAL HIGH (ref 70–99)
Glucose-Capillary: 169 mg/dL — ABNORMAL HIGH (ref 70–99)

## 2024-03-21 LAB — BASIC METABOLIC PANEL WITH GFR
Anion gap: 9 (ref 5–15)
BUN: 28 mg/dL — ABNORMAL HIGH (ref 8–23)
CO2: 22 mmol/L (ref 22–32)
Calcium: 8.8 mg/dL — ABNORMAL LOW (ref 8.9–10.3)
Chloride: 109 mmol/L (ref 98–111)
Creatinine, Ser: 1.19 mg/dL (ref 0.61–1.24)
GFR, Estimated: >60 mL/min (ref >60)
Glucose, Bld: 123 mg/dL — ABNORMAL HIGH (ref 70–99)
Potassium: 3.7 mmol/L (ref 3.5–5.1)
Sodium: 140 mmol/L (ref 135–145)

## 2024-03-21 LAB — CBG MONITORING, ED: Glucose-Capillary: 112 mg/dL — ABNORMAL HIGH (ref 70–99)

## 2024-03-21 LAB — PROCALCITONIN: Procalcitonin: <0.1 ng/mL

## 2024-03-21 LAB — VALPROIC ACID LEVEL: Valproic Acid Lvl: <10 ug/mL — ABNORMAL LOW (ref 50–100)

## 2024-03-21 MED ORDER — PIPERACILLIN-TAZOBACTAM 3.375 G IVPB 30 MIN: 3.3750 g | Freq: Four times a day (QID) | INTRAVENOUS | Status: DC

## 2024-03-21 MED ORDER — TUBERCULIN PPD 5 UNIT/0.1ML ID SOLN
5.0000 [IU] | Freq: Once | INTRADERMAL | Status: AC
  Administered 2024-03-22: 5 [IU] via INTRADERMAL
  Filled 2024-03-21: qty 0.1

## 2024-03-21 MED ORDER — ALBUTEROL SULFATE (2.5 MG/3ML) 0.083% IN NEBU: 2.5000 mg | INHALATION_SOLUTION | Freq: Four times a day (QID) | RESPIRATORY_TRACT | Status: DC | PRN

## 2024-03-21 MED ORDER — ONDANSETRON HCL 4 MG PO TABS: 4.0000 mg | ORAL_TABLET | Freq: Four times a day (QID) | ORAL | Status: DC | PRN

## 2024-03-21 MED ORDER — ACETAMINOPHEN 650 MG RE SUPP: 650.0000 mg | Freq: Four times a day (QID) | RECTAL | Status: DC | PRN

## 2024-03-21 MED ORDER — ONDANSETRON HCL 4 MG/2ML IJ SOLN: 4.0000 mg | Freq: Four times a day (QID) | INTRAMUSCULAR | Status: DC | PRN

## 2024-03-21 MED ORDER — PIPERACILLIN-TAZOBACTAM 3.375 G IVPB 30 MIN
3.3750 g | Freq: Once | INTRAVENOUS | Status: AC
  Administered 2024-03-21: 3.375 g via INTRAVENOUS
  Filled 2024-03-21: qty 50

## 2024-03-21 MED ORDER — SODIUM CHLORIDE 0.9 % IV SOLN: INTRAVENOUS | Status: DC

## 2024-03-21 MED ORDER — ACETAMINOPHEN 325 MG PO TABS
650.0000 mg | ORAL_TABLET | Freq: Four times a day (QID) | ORAL | Status: DC | PRN
  Administered 2024-03-22 – 2024-03-29 (×3): 650 mg via ORAL
  Filled 2024-03-21 (×3): qty 2

## 2024-03-21 MED ORDER — PIPERACILLIN-TAZOBACTAM 3.375 G IVPB
3.3750 g | Freq: Three times a day (TID) | INTRAVENOUS | Status: DC
  Administered 2024-03-21: 3.375 g via INTRAVENOUS
  Filled 2024-03-21: qty 50

## 2024-03-21 MED ORDER — TRAZODONE HCL 50 MG PO TABS
25.0000 mg | ORAL_TABLET | Freq: Every evening | ORAL | Status: DC | PRN
  Administered 2024-03-21 – 2024-04-26 (×33): 25 mg via ORAL
  Filled 2024-03-21 (×31): qty 1

## 2024-03-21 MED ORDER — MAGNESIUM HYDROXIDE 400 MG/5ML PO SUSP
30.0000 mL | Freq: Every day | ORAL | Status: DC | PRN
  Administered 2024-04-01 – 2024-04-26 (×2): 30 mL via ORAL
  Filled 2024-03-21 (×2): qty 30

## 2024-03-21 MED ORDER — SODIUM CHLORIDE 0.9 % IV BOLUS (SEPSIS)
1000.0000 mL | Freq: Once | INTRAVENOUS | Status: AC
  Administered 2024-03-21: 1000 mL via INTRAVENOUS

## 2024-03-21 MED ORDER — ZIPRASIDONE MESYLATE 20 MG IM SOLR
10.0000 mg | Freq: Four times a day (QID) | INTRAMUSCULAR | Status: DC | PRN
  Administered 2024-03-25 – 2024-03-31 (×3): 10 mg via INTRAMUSCULAR
  Filled 2024-03-21 (×4): qty 20

## 2024-03-21 MED ORDER — ENOXAPARIN SODIUM 40 MG/0.4ML IJ SOSY
40.0000 mg | PREFILLED_SYRINGE | INTRAMUSCULAR | Status: DC
  Administered 2024-03-21 – 2024-05-03 (×46): 40 mg via SUBCUTANEOUS
  Filled 2024-03-21 (×43): qty 0.4

## 2024-03-21 NOTE — TOC Initial Note (Signed)
 Transition of Care [facility]) - Initial/Assessment Note    Patient Details  Name: Rick [name] MRN: [id number] Date of Birth: 1941-07-05  Transition of Care [facility]) CM/SW Contact:    Rick ONEIDA Haddock, RN Phone Number: 03/21/2024, 3:17 PM  Clinical Narrative:                  Patient was dc home 03/19/24 with Home health services through [facility] Patient is currently under IVC  Rick [name] with IP Care Management received call from [facility] Rick [name] stating that they have a protective order and are seeking placement for patient.    Secure email below sent to DSS for clarification  Good Afternoon.  received notification from Rick [name] that she was contacted stating Rick [name] is under IV and has a protection order  MD and psych would like clarification.  Psych wants to know if DSS has determined that patient lacks capacity, and is DSS pursing guardianship? She also states that both brothers want him home and are "fighting to get him home"  If Psych rescinds IVC order MD wants to have a clear understanding if patient is still to be held in the hospital with a protective order  When you are able can you please email a copy of the protective order, so it can be printed and placed in the chart  Thanks Rick [name]         Patient Goals and CMS Choice            Expected Discharge Plan and Services                                              Prior Living Arrangements/Services                       Activities of Daily Living   ADL Screening (condition at time of admission) Independently performs ADLs?: No Does the patient have a NEW difficulty with bathing/dressing/toileting/self-feeding that is expected to last >3 days?: No Does the patient have a NEW difficulty with getting in/out of bed, walking, or climbing stairs that is expected to last >3 days?: No Does the patient have a NEW difficulty with communication that is  expected to last >3 days?: No Is the patient deaf or have difficulty hearing?: Yes Does the patient have difficulty seeing, even when wearing glasses/contacts?: No Does the patient have difficulty concentrating, remembering, or making decisions?: Yes  Permission Sought/Granted                  Emotional Assessment              Admission diagnosis:  Aggressive behavior [R46.89] Acute UTI [N39.0] Acute metabolic encephalopathy [G93.41] Patient Active Problem List   Diagnosis Date Noted   Acute metabolic encephalopathy 03/21/2024   Type 2 diabetes mellitus with peripheral neuropathy (HCC) 03/21/2024   Acute lower UTI 03/21/2024   Vitamin B12 deficiency 03/14/2024   Hyponatremia 03/14/2024   Acute delirium 03/12/2024   Parkinsonism (HCC) 03/12/2024   Paroxysmal atrial fibrillation (HCC) 03/12/2024   History of bipolar disorder 03/12/2024   AKI (acute kidney injury) (HCC) 03/12/2024   Tremor 05/28/2018   Urinary tract infection due to extended-spectrum beta lactamase (ESBL) producing Escherichia coli 11/29/2017   Osteoarthritis of multiple joints 08/23/2017   PVD (peripheral vascular disease) (HCC) 05/14/2017  Hard of hearing 12/07/2016   History of tobacco abuse 12/07/2016   History of ulcerative colitis 12/07/2016   Ventral hernia 12/21/2015   History of colon cancer, no staging 12/21/2015   Controlled type 2 diabetes mellitus without complication, without long-term current use of insulin  (HCC) 10/04/2015   History of bladder cancer 08/05/2015   Benign fibroma of prostate 08/05/2015   History of alcoholism (HCC) 08/05/2015   History of migraine headaches 08/05/2015   Calculus of kidney 08/05/2015   Essential hypertension 05/11/2015   Hyperlipidemia 05/11/2015   Affective bipolar disorder (HCC) 03/24/2015   Diabetic peripheral neuropathy associated with type 2 diabetes mellitus (HCC) 03/24/2015   Insomnia 03/24/2015   History of abdominal hernia 02/10/2015    Anxiety and depression 02/10/2015   Generalized anxiety disorder 05/18/2014   Chronic prostatitis 06/12/2013   Herpesviral infection of penis 11/07/2012   Incomplete bladder emptying 11/07/2012   Incisional hernia 11/07/2012   Balanoposthitis 10/31/2012   Benign localized prostatic hyperplasia with lower urinary tract symptoms (LUTS) 10/31/2012   ED (erectile dysfunction) of organic origin 10/31/2012   History of cancer of ureter 10/31/2012   PCP:  Sampson Ethridge LABOR, MD Pharmacy:   [facility] - [place], [place] - [address] [place] [address] Phone: [phone] Fax: [phone]     Social Drivers of Health (SDOH) Social History: SDOH Screenings   Food Insecurity: Patient Declined (03/21/2024)  Housing: Patient Declined (03/21/2024)  Transportation Needs: Patient Declined (03/21/2024)  Utilities: Patient Declined (03/21/2024)  Depression (PHQ2-9): Low Risk  (10/26/2021)  Financial Resource Strain: High Risk (07/17/2022)  Social Connections: Patient Declined (03/21/2024)  Tobacco Use: Medium Risk (03/20/2024)   SDOH Interventions:     Readmission Risk Interventions     No data to display

## 2024-03-21 NOTE — Assessment & Plan Note (Addendum)
-   The patient was admitted to a medical telemetry sedation bed. - This is likely secondary to persistent UTI. - It could also be attributed to subtherapeutic Depakote  level indicating likely noncompliance with management his bipolar disorder. - The patient was combative and therefore had IVC before presenting to the ED. - Psychiatry consult was requested by EDP.

## 2024-03-21 NOTE — Care Management Obs Status (Signed)
 MEDICARE OBSERVATION STATUS NOTIFICATION   Patient Details  Name: BOLESLAUS HOLLOWAY MRN: [id number] Date of Birth: 23-Aug-1940   Medicare Observation Status Notification Given:   (spoke w/sister, patient is severely hard of hearing)    Rojelio [name] 03/21/2024, 2:59 PM

## 2024-03-21 NOTE — TOC Progression Note (Signed)
 Transition of Care [facility]) - Progression Note    Patient Details  Name: Rick [name] MRN: [id number] Date of Birth: 21-Sep-1940  Transition [facility] CM/SW Contact  [name], RN Phone Number: 03/21/2024, 5:00 PM  Clinical Narrative:     Per [place] DSS supervisor Good Ona Corean,   I have attached the Protective Order giving DSS authority to make decisions for the adult based on his inability to do so.   Glendia Richters is the Supervisor on call and can be reached at [phone] if there are an concerns over the weekend.  We will place Mr. Abramo from the hospital and will need a FL-2 and a TB skin test.  Elease Molly is the Child psychotherapist for Mr. Kuechle and can be reached at [phone].  If there are any questions, please let me know.   Thanks,  -Kailee    Protective order printed to the Unit and staff to place in chart.  MD to order TB skin test.  Email sent to dss to determine what level of care they are requested for Fl2 to be completed                     Expected Discharge Plan and Services                                               Social Drivers of Health (SDOH) Interventions SDOH Screenings   Food Insecurity: Patient Declined (03/21/2024)  Housing: Patient Declined (03/21/2024)  Transportation Needs: Patient Declined (03/21/2024)  Utilities: Patient Declined (03/21/2024)  Depression (PHQ2-9): Low Risk  (10/26/2021)  Financial Resource Strain: High Risk (07/17/2022)  Social Connections: Patient Declined (03/21/2024)  Tobacco Use: Medium Risk (03/20/2024)    Readmission Risk Interventions     No data to display

## 2024-03-21 NOTE — Consult Note (Incomplete)
 Protection Psychiatric Consult Follow-up  Patient Name: .Rick [name]  MRN: [id number]  DOB: 09-Oct-1940  Consult Order details:  Orders (From admission, onward)     Start     Ordered   03/10/24 1414  IP CONSULT TO PSYCHIATRY       Ordering Provider: Dicky Anes, MD  Provider:  (Not yet assigned)  Question Answer Comment  Place call to: psych md   Reason for Consult Consult   Diagnosis/Clinical Info for Consult: mental capacity, altered sensorium, IVC      03/10/24 1413             Mode of Visit: In person    Psychiatry Consult Evaluation  Service Date: March 21, 2024 LOS:  LOS: 0 days  Chief Complaint follow up consult  Primary Psychiatric Diagnoses  Delirium due to multiple etiologies--UTI Parkinsonism on Sinemet    Assessment  Rick [name] is a 83 y.o. male admitted: Medically    The patient is an 83 y.o. male with medical history significant of  parkinsonism on carbidopa  levodopa , hypertension, diabetes. Patient is acutely confused and very hard of hearing.  The following information was obtained from chart review and ER report: The patient was found at [facility] today and police were called due to patient reportedly demonstrating bizarre behaviors.  Patient then got in car and attempted to drive and was stopped by 088.  While waiting in police car patient became short of breath which police report may have been secondary to heat.  EMS was called.  On arrival to the ED patient was agitated and confused.  He required multiple rounds of Versed .  He remains encephalopathic and requesting to discharge home.  ED physician placed IVC for patient.Labs are mostly unremarkable.  UA consistent with possible UTI.  Patient has been started on ceftriaxone . Reported rhythm on arrival is A-fib RVR.  Presently he is tremulous with significant artifact, but appears to be NSR in the 80s. Psychiatry is consulted for safety eval.      03/18/2024: On assessment patient remains calm and  cooperative.  His orientation and mental status improved significantly.  He remains compliant with medications.  He denies SI/HI/plan and denies hallucinations.  There is no indication of IVC at this time.  We rescinded the IVC today.  No indication of inpatient psychiatric admission at this time  Diagnoses:  Active Hospital problems: Principal Problem:   Acute metabolic encephalopathy Active Problems:   Parkinsonism (HCC)   Paroxysmal atrial fibrillation (HCC)   Type 2 diabetes mellitus with peripheral neuropathy (HCC)   Acute lower UTI    Plan   ## Psychiatric Medication Recommendations:  Will continue with current medications.  Seroquel  25 mg nightly and 12. 5 mg as needed every 8 hours for agitation. Depakote  500 mg daily.   ## Medical Decision Making Capacity:  Mental status improving and is awake, alert, oriented x 3.  He has been compliant with treatment and medications.  No indication for IVC at this time  ## Further Work-up:   -- most recent EKG on 03/10/2024 had QtC of 445 -- Pertinent labwork reviewed earlier this admission includes: see previous notes   ## Disposition:--No psychiatric contraindication for discharge  ## Behavioral / Environmental: -Delirium Precautions: Delirium Interventions for Nursing and Staff: - RN to open blinds every AM. - To Bedside: Glasses, hearing aide, and pt's own shoes. Make available to patients. when possible and encourage use. - Encourage po fluids when appropriate, keep fluids within reach. -  OOB to chair with meals. - Passive ROM exercises to all extremities with AM & PM care. - RN to assess orientation to person, time and place QAM and PRN. - Recommend extended visitation hours with familiar family/friends as feasible. - Staff to minimize disturbances at night. Turn off television when pt asleep or when not in use.    ## Safety and Observation Level:  - Based on my clinical evaluation, I estimate the patient to be at low risk of self  harm in the current setting. - At this time, we recommend  routine. This decision is based on my review of the chart including patient's history and current presentation, interview of the patient, mental status examination, and consideration of suicide risk including evaluating suicidal ideation, plan, intent, suicidal or self-harm behaviors, risk factors, and protective factors. This judgment is based on our ability to directly address suicide risk, implement suicide prevention strategies, and develop a safety plan while the patient is in the clinical setting. Please contact our team if there is a concern that risk level has changed.  CSSR Risk Category:C-SSRS RISK CATEGORY: No Risk  Suicide Risk Assessment: Patient has following modifiable risk factors for suicide: social isolation, which we are addressing by collaborating with family members. Patient has following non-modifiable or demographic risk factors for suicide: male gender Patient has the following protective factors against suicide: Supportive family  Thank you for this consult request. Recommendations have been communicated to the primary team.  We will sign off at this time.   Irvan Tiedt, MD       History of Present Illness  Relevant Aspects of [facility]   03/18/24: Today on assessment patient is noted to be resting in bed.  He answers most of the return questions very appropriately.  He is able to recognize he is in the hospital.  He denies any problems with the medications.  He reports that he has a supportive family and wants to go home.  He denies SI/HI/plan and denies any confusion or hallucinations.  Per sitter patient has not had any unsafe behaviors or aggressive behaviors in the last few days.    Psychiatric and Social History  Psychiatric History:  Information collected from chart and patient  Prev Dx/Sx: unknown Current Psych Provider: unknown Home Meds (current): unknown Previous Med Trials:  unknown Therapy: unknown  Prior Psych Hospitalization: unknown  Prior Self Harm: uunknownnknown Prior Violence: unknown  Family Psych History: unknown Family Hx suicide: unknown  Social History:   Educational Hx: unknown Occupational Hx: unknown Legal Hx: unknownunknown Living Situation: unknown Spiritual Hx: unknown Access to weapons/lethal means: unknown   Substance History Alcohol: unknown  Type of alcohol unknown Last Drink unknown Number of drinks per day unknown History of alcohol withdrawal seizures unknown History of DT's unknown Tobacco: unknown Illicit drugs: unknown Prescription drug abuse: unknown Rehab hx: unknown  Exam Findings  Physical Exam: Patient laying comfortably, Easily awakened by calling his name loudly Breathing is non-labored, and patient able to talk. Is alert ,oriented to self, location and situation. Hard of hearing and answers questions appropriately. Attention, concentration, and orientation are all improving at this time.  Vital Signs:  Temp:  [97.6 F (36.4 C)-98.3 F (36.8 C)] 98.3 F (36.8 C) (08/08 1900) Pulse Rate:  [71-92] 88 (08/08 1900) Resp:  [16-22] 22 (08/08 1900) BP: (90-127)/(54-79) 119/79 (08/08 1900) SpO2:  [93 %-97 %] 94 % (08/08 1900) Blood pressure 119/79, pulse 88, temperature 98.3 F (36.8 C), temperature source Oral, resp.  rate (!) 22, height 5' 5 (1.651 m), weight 79.4 kg, SpO2 94%. Body mass index is 29.12 kg/m.    Mental Status Exam: General Appearance: stated age  Orientation:  Full (Time, Place, and Person)  Memory:  Immediate;   Fair Recent;   Fair Remote;   Fair  Concentration:  Concentration: Fair and Attention Span: Fair  Recall:  Fair  Attention  Fair  Eye Contact:  Fair  Speech:  Normal Rate  Language:  Good  Volume:  Increased  Mood: euthymic  Affect:  Appropriate  Thought Process:  Goal Directed  Thought Content:  WDL  Suicidal Thoughts:  No  Homicidal Thoughts:  No  Judgement:  Fair   Insight:  Fair  Psychomotor Activity:  Normal  Akathisia:  No  Fund of Knowledge:  Fair      Assets:  Desire for Improvement  Cognition:  Impaired,  Mild  ADL's:  Impaired  AIMS (if indicated):        Other History   These have been pulled in through the EMR, reviewed, and updated if appropriate.  Family History:  The patient's family history includes Cancer in his brother; Diabetes in his father; Transient ischemic attack in his mother.  Medical History: Past Medical History:  Diagnosis Date  . Anxiety   . Bipolar disorder (HCC)   . Bladder cancer (HCC)   . BPH (benign prostatic hypertrophy)   . Chest pain, atypical 06/23/2015  . Closed trimalleolar fracture of left ankle 10/01/2015  . Cystitis    hx of   . Depression   . Diabetes mellitus without complication (HCC)    type 2   . Difficult or painful urination 10/31/2012  . Dysrhythmia   . Ear problem 03/24/2015  . Gangrenous appendicitis   . H/O urinary disorder 03/27/2013  . Herpes genitalis in men   . Hyperlipidemia   . Hypertension   . Insomnia   . Kidney stones   . Left hand pain 05/18/2015  . Mass of arm 02/10/2015  . Peripheral neuropathy   . Skin cancer   . UD (urethral discharge) 10/31/2012  . Urinary tract infection    hx of     Surgical History: Past Surgical History:  Procedure Laterality Date  . APPENDECTOMY     RUPTURED  . BLADDER SURGERY    . CHOLECYSTECTOMY    . COLOSTOMY     AND LATER CLOSURE  . CYSTOSCOPY/URETEROSCOPY/HOLMIUM LASER/STENT PLACEMENT Left 03/22/2018   Procedure: CYSTOSCOPY/LEFT URETEROSCOPY/LEFT RETROGRADE PYELOGRAM;  Surgeon: Carolee Sherwood JONETTA Michah, MD;  Location: [facility];  Service: Urology;  Laterality: Left;  . EPIDIDYMECTOMY N/A 01/03/2019   Procedure: RIGHT EPIDIDYMAL CYST REMOVAL VERSES EPIDIDMECTOMY;  Surgeon: Carolee Sherwood JONETTA Aaryav, MD;  Location: [facility];  Service: Urology;  Laterality: N/A;  . FRACTURE SURGERY    . HERNIA REPAIR    . INNER EAR SURGERY Left   . ORIF ANKLE  FRACTURE Left 09/27/2015   Procedure: OPEN REDUCTION INTERNAL FIXATION (ORIF) ANKLE FRACTURE;  Surgeon: Norleen JINNY Maltos, MD;  Location: [facility];  Service: Orthopedics;  Laterality: Left;  . SKIN CANCER EXCISION    . TRANSURETHRAL RESECTION OF PROSTATE N/A 03/22/2018   Procedure: TRANSURETHRAL RESECTION OF THE PROSTATE (TURP);  Surgeon: Carolee Sherwood JONETTA Wesam, MD;  Location: [facility];  Service: Urology;  Laterality: N/A;  . VENTRAL HERNIA REPAIR N/A 01/14/2016   Procedure: HERNIA REPAIR VENTRAL ADULT;  Surgeon: Larinda Unknown Sharps, MD;  Location: [facility];  Service: General;  Laterality: N/A;  Medications:   Current Facility-Administered Medications:  .  acetaminophen  (TYLENOL ) tablet 650 mg, 650 mg, Oral, Q6H PRN **OR** acetaminophen  (TYLENOL ) suppository 650 mg, 650 mg, Rectal, Q6H PRN, Mansy, Jan A, MD .  albuterol  (PROVENTIL ) (2.5 MG/3ML) 0.083% nebulizer solution 2.5 mg, 2.5 mg, Nebulization, Q6H PRN, Dail Rankin RAMAN, RPH .  [START ON 03/26/2024] amiodarone  (PACERONE ) tablet 200 mg, 200 mg, Oral, BID, Ward, Kristen N, DO .  [START ON 04/09/2024] amiodarone  (PACERONE ) tablet 200 mg, 200 mg, Oral, Daily, Ward, Kristen N, DO .  amiodarone  (PACERONE ) tablet 400 mg, 400 mg, Oral, BID, Ward, Kristen N, DO, 400 mg at 03/21/24 2105 .  carbidopa -levodopa  (SINEMET  IR) 25-100 MG per tablet immediate release 1 tablet, 1 tablet, Oral, TID, Ward, Kristen N, DO, 1 tablet at 03/21/24 2106 .  divalproex  (DEPAKOTE  ER) 24 hr tablet 500 mg, 500 mg, Oral, Daily, Ward, Kristen N, DO, 500 mg at 03/21/24 1018 .  enoxaparin  (LOVENOX ) injection 40 mg, 40 mg, Subcutaneous, Q24H, Mansy, Jan A, MD, 40 mg at 03/21/24 1018 .  glipiZIDE  (GLUCOTROL  XL) 24 hr tablet 2.5 mg, 2.5 mg, Oral, Q breakfast, Ward, Kristen N, DO, 2.5 mg at 03/21/24 1018 .  magnesium  hydroxide (MILK OF MAGNESIA) suspension 30 mL, 30 mL, Oral, Daily PRN, Mansy, Jan A, MD .  metoprolol  succinate (TOPROL -XL) 24 hr tablet 12.5 mg, 12.5 mg, Oral, Daily, Ward,  Kristen N, DO, 12.5 mg at 03/21/24 1018 .  ondansetron  (ZOFRAN ) tablet 4 mg, 4 mg, Oral, Q6H PRN **OR** [DISCONTINUED] ondansetron  (ZOFRAN ) injection 4 mg, 4 mg, Intravenous, Q6H PRN, Mansy, Jan A, MD .  QUEtiapine  (SEROQUEL ) tablet 25 mg, 25 mg, Oral, QHS, Ward, Kristen N, DO, 25 mg at 03/21/24 2107 .  traZODone  (DESYREL ) tablet 25 mg, 25 mg, Oral, QHS PRN, Mansy, Jan A, MD, 25 mg at 03/21/24 2106 .  tuberculin injection 5 Units, 5 Units, Intradermal, Once, Sreenath, Sudheer B, MD .  ziprasidone  (GEODON ) injection 10 mg, 10 mg, Intramuscular, Q6H PRN, Jhonny Calvin NOVAK, MD  Allergies: Allergies  Allergen Reactions  . Blood-Group Specific Substance   . Haloperidol Other (See Comments)    Unknown  . Sulfa Antibiotics Other (See Comments)    Unknown    Allyn Foil, MD

## 2024-03-21 NOTE — ED Notes (Signed)
 Pt's sister Deanna updated on pt's status and admission status, informed her he was admitted to the hospital and was in room 219. Also informed her of the visiting hours.
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
## 2024-03-21 NOTE — BH Assessment (Signed)
 This writer attempted to assess patient but patient was unable to be aroused due to medications administered. Psyc team to follow
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
## 2024-03-21 NOTE — Assessment & Plan Note (Signed)
-  Will continue amiodarone .

## 2024-03-21 NOTE — ED Notes (Signed)
 Pt had incontinent episode, cleansed and dressed out into hospital attire, pt has life alert necklace on. Pt under IVC and will be escorted to [facility] with ED tech and security.  Pt is loud at times and yells out, but has not been combative.
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
## 2024-03-21 NOTE — ED Notes (Signed)
 Per Dr.Jaadapalle request patient was rescinded from IVC all paper work completed scanned into epic and tubed up to room 219 to be placed in chart.

## 2024-03-21 NOTE — Assessment & Plan Note (Signed)
-   The patient will be placed on supplemental coverage with NovoLog . - Will continue Glucotrol  XL.

## 2024-03-21 NOTE — H&P (Signed)
 [place]   PATIENT NAME: Rick [name]    MR#:  [id number]  DATE OF BIRTH:  Mar 08, 1941  DATE OF ADMISSION:  03/20/2024  PRIMARY CARE PHYSICIAN: Entzminger, Ethridge LABOR, MD   Patient is coming from: Home  REQUESTING/REFERRING PHYSICIAN: Ward, Josette SAILOR, DO  CHIEF COMPLAINT:   Chief Complaint  Patient presents with   Psychiatric Evaluation    HISTORY OF PRESENT ILLNESS:  Rick [name] is a 83 y.o. Caucasian male with medical history significant for anxiety, depression, type 2 diabetes mellitus, hypertension, dyslipidemia and BPH, who presented to the emergency room with acute onset of altered mental status with confusion and agitation presenting under IVC.  The patient was just discharged a couple days ago after being managed for ESBL E. coli UTI.  His improved on IV antibiotic therapy and was discharged home.  Before he presented to the ER he was found driving erratically, confused and agitated as well as combative before he was placed on IVC as he was uncooperative with low enforcement.  He was fairly somnolent and difficult to arouse during my interview and therefore no history could be obtained.  ED Course: When he came to the ER, vital signs were within normal.  Labs revealed CO2 of 21 and a BUN of 23 and creatinine 1.39 with blood glucose of 135 and albumin 2.9.  CBC showed hemoglobin 12.2 hematocrit 37.  Tylenol  level was less than 10 and salicylate less than 7.  Depakote  level was less than 10 and.  UA was positive for UTI and urine drug screen came back negative.  Urine culture was sent. EKG as reviewed by me : None. Imaging: None.  The patient was given IV Zosyn .  He will be admitted to a medical telemetry observation bed for further evaluation and management. PAST MEDICAL HISTORY:   Past Medical History:  Diagnosis Date   Anxiety    Bipolar disorder (HCC)    Bladder cancer (HCC)    BPH (benign prostatic hypertrophy)    Chest pain, atypical 06/23/2015   Closed  trimalleolar fracture of left ankle 10/01/2015   Cystitis    hx of    Depression    Diabetes mellitus without complication (HCC)    type 2    Difficult or painful urination 10/31/2012   Dysrhythmia    Ear problem 03/24/2015   Gangrenous appendicitis    H/O urinary disorder 03/27/2013   Herpes genitalis in men    Hyperlipidemia    Hypertension    Insomnia    Kidney stones    Left hand pain 05/18/2015   Mass of arm 02/10/2015   Peripheral neuropathy    Skin cancer    UD (urethral discharge) 10/31/2012   Urinary tract infection    hx of     PAST SURGICAL HISTORY:   Past Surgical History:  Procedure Laterality Date   APPENDECTOMY     RUPTURED   BLADDER SURGERY     CHOLECYSTECTOMY     COLOSTOMY     AND LATER CLOSURE   CYSTOSCOPY/URETEROSCOPY/HOLMIUM LASER/STENT PLACEMENT Left 03/22/2018   Procedure: CYSTOSCOPY/LEFT URETEROSCOPY/LEFT RETROGRADE PYELOGRAM;  Surgeon: Carolee Sherwood JONETTA Haru, MD;  Location: [facility];  Service: Urology;  Laterality: Left;   EPIDIDYMECTOMY N/A 01/03/2019   Procedure: RIGHT EPIDIDYMAL CYST REMOVAL VERSES EPIDIDMECTOMY;  Surgeon: Carolee Sherwood JONETTA Renner, MD;  Location: [facility];  Service: Urology;  Laterality: N/A;   FRACTURE SURGERY     HERNIA REPAIR     INNER EAR  SURGERY Left    ORIF ANKLE FRACTURE Left 09/27/2015   Procedure: OPEN REDUCTION INTERNAL FIXATION (ORIF) ANKLE FRACTURE;  Surgeon: Norleen JINNY Maltos, MD;  Location: [facility];  Service: Orthopedics;  Laterality: Left;   SKIN CANCER EXCISION     TRANSURETHRAL RESECTION OF PROSTATE N/A 03/22/2018   Procedure: TRANSURETHRAL RESECTION OF THE PROSTATE (TURP);  Surgeon: Carolee Sherwood JONETTA Mazen, MD;  Location: [facility];  Service: Urology;  Laterality: N/A;   VENTRAL HERNIA REPAIR N/A 01/14/2016   Procedure: HERNIA REPAIR VENTRAL ADULT;  Surgeon: Larinda Unknown Sharps, MD;  Location: [facility];  Service: General;  Laterality: N/A;    SOCIAL HISTORY:   Social History   Tobacco Use   Smoking status: Former    Current packs/day: 0.00     Average packs/day: 1.5 packs/day for 15.0 years (22.5 ttl pk-yrs)    Types: Cigarettes    Start date: 08/14/1994    Quit date: 03/23/2005    Years since quitting: 19.0   Smokeless tobacco: Former    Types: Snuff, Chew    Quit date: 03/23/2010  Substance Use Topics   Alcohol use: No    Alcohol/week: 0.0 standard drinks of alcohol    FAMILY HISTORY:   Family History  Problem Relation Age of Onset   Transient ischemic attack Mother    Diabetes Father    Cancer Brother     DRUG ALLERGIES:   Allergies  Allergen Reactions   Blood-Group Specific Substance    Haloperidol Other (See Comments)    Unknown   Sulfa Antibiotics Other (See Comments)    Unknown    REVIEW OF SYSTEMS:   ROS As per history of present illness. All pertinent systems were reviewed above. Constitutional, HEENT, cardiovascular, respiratory, GI, GU, musculoskeletal, neuro, psychiatric, endocrine, integumentary and hematologic systems were reviewed and are otherwise negative/unremarkable except for positive findings mentioned above in the HPI.   MEDICATIONS AT HOME:   Prior to Admission medications   Medication Sig Start Date End Date Taking? Authorizing Provider  albuterol  (VENTOLIN  HFA) 108 (90 Base) MCG/ACT inhaler Inhale 2 puffs into the lungs every 6 (six) hours as needed for wheezing or shortness of breath. 03/19/24  Yes Josette Ade, MD  amiodarone  (PACERONE ) 400 MG tablet 2 tabs po twice a day for 6 days then 1 tab po twice a day for 14 days then one tab po daily afterwards 03/19/24  Yes Wieting, Richard, MD  carbidopa -levodopa  (SINEMET  IR) 25-100 MG tablet Take 1 tablet by mouth 3 (three) times daily. 03/19/24  Yes Josette Ade, MD  cyanocobalamin  1000 MCG tablet Take 1 tablet (1,000 mcg total) by mouth daily. 03/19/24  Yes Wieting, Richard, MD  diclofenac  Sodium (VOLTAREN ) 1 % GEL Apply 2 g topically 4 (four) times daily. 03/19/24  Yes Josette Ade, MD  divalproex  (DEPAKOTE  ER) 500 MG 24 hr tablet  Take 1 tablet (500 mg total) by mouth daily. 03/19/24  Yes Josette Ade, MD  glipiZIDE  (GLUCOTROL  XL) 2.5 MG 24 hr tablet Take 1 tablet (2.5 mg total) by mouth daily with breakfast. 03/19/24  Yes Wieting, Richard, MD  metoprolol  succinate (TOPROL -XL) 25 MG 24 hr tablet Take 0.5 tablets (12.5 mg total) by mouth daily. 03/19/24  Yes Wieting, Richard, MD  QUEtiapine  (SEROQUEL ) 25 MG tablet Take 1 tablet (25 mg total) by mouth at bedtime. 03/19/24  Yes Wieting, Richard, MD  valACYclovir  (VALTREX ) 1000 MG tablet Take 1 tablet (1,000 mg total) by mouth daily as needed. 03/27/22  Yes Johnson, Megan P, DO  Blood Glucose Monitoring Suppl (ONE TOUCH ULTRA SYSTEM KIT) w/Device KIT 1 kit by Does not apply route once. 02/11/16   Daphane Rosella, NP  Lancets [facility] ULTRASOFT) lancets TEST BLOOD SUGAR THREE TIMES DAILY 04/22/19   Johnson, Megan P, DO  ONETOUCH ULTRA test strip TEST BLOOD SUGAR THREE TIMES DAILY. 11/02/21   Johnson, Megan P, DO      VITAL SIGNS:  Blood pressure 101/77, pulse 81, temperature 98 F (36.7 C), resp. rate 16, height 5' 5 (1.651 m), weight 79.4 kg, SpO2 95%.  PHYSICAL EXAMINATION:  Physical Exam  GENERAL:  83 y.o.-year-old patient lying in the bed with no acute distress.  The patient was fairly somnolent and difficult to arouse. EYES: Pupils equal, round, reactive to light and accommodation. No scleral icterus. Extraocular muscles intact.  HEENT: Head atraumatic, normocephalic. Oropharynx and nasopharynx clear.  NECK:  Supple, no jugular venous distention. No thyroid  enlargement, no tenderness.  LUNGS: Normal breath sounds bilaterally, no wheezing, rales,rhonchi or crepitation. No use of accessory muscles of respiration.  CARDIOVASCULAR: Regular rate and rhythm, S1, S2 normal. No murmurs, rubs, or gallops.  ABDOMEN: Soft, nondistended, nontender. Bowel sounds present. No organomegaly or mass.  EXTREMITIES: No pedal edema, cyanosis, or clubbing.  NEUROLOGIC: Cranial nerves II through  XII are intact. Muscle strength 5/5 in all extremities. Sensation intact. Gait not checked.  PSYCHIATRIC: The patient is somnolent and difficult to arouse.   SKIN: No obvious rash, lesion, or ulcer.   LABORATORY PANEL:   CBC Recent Labs  Lab 03/21/24 0345  WBC 7.2  HGB 12.5*  HCT 39.0  PLT 246   ------------------------------------------------------------------------------------------------------------------  Chemistries  Recent Labs  Lab 03/15/24 0356 03/20/24 2100  NA 134* 137  K 3.9 3.7  CL 102 105  CO2 24 21*  GLUCOSE 149* 135*  BUN 24* 33*  CREATININE 0.95 1.39*  CALCIUM 9.0 9.1  MG 1.8  --   AST 17 19  ALT <5 13  ALKPHOS 48 43  BILITOT 0.6 0.6   ------------------------------------------------------------------------------------------------------------------  Cardiac Enzymes No results for input(s): TROPONINI in the last 168 hours. ------------------------------------------------------------------------------------------------------------------  RADIOLOGY:  No results found.    IMPRESSION AND PLAN:  Assessment and Plan: * Acute metabolic encephalopathy - The patient was admitted to a medical telemetry sedation bed. - This is likely secondary to persistent UTI. - It could also be attributed to subtherapeutic Depakote  level indicating likely noncompliance with management his bipolar disorder. - The patient was combative and therefore had IVC before presenting to the ED. - Psychiatry consult was requested by EDP.  Acute lower UTI - We will continue him on IV Zosyn  especially given his recent ESBL E. coli UTI. - Will follow urine culture and sensitivity.  Type 2 diabetes mellitus with peripheral neuropathy (HCC) - The patient will be placed on supplemental coverage with NovoLog . - Will continue Glucotrol  XL.  Parkinsonism (HCC) - Will continue Sinemet  IR.  Paroxysmal atrial fibrillation (HCC) - Will continue amiodarone .   DVT prophylaxis:  Lovenox .  Advanced Care Planning:  Code Status: full code.  Family Communication:  The plan of care was discussed in details with the patient (and family). I answered all questions. The patient agreed to proceed with the above mentioned plan. Further management will depend upon hospital course. Disposition Plan: Back to previous home environment Consults called: none.  All the records are reviewed and case discussed with ED provider.  Status is: Observation  I certify that at the time of admission, it is my clinical  judgment that the patient will require hospital care extending less than 2 midnights.                            Dispo: The patient is from: Home              Anticipated d/c is to: Home              Patient currently is not medically stable to d/c.              Difficult to place patient: No  Madison DELENA Peaches M.D on 03/21/2024 at 5:10 AM  [facility]   From 7 PM-7 AM, contact night-coverage [email or web address]  CC: Primary care physician; Entzminger, Ethridge DELENA, MD

## 2024-03-21 NOTE — Assessment & Plan Note (Signed)
-   Will continue Sinemet  IR.

## 2024-03-21 NOTE — Assessment & Plan Note (Addendum)
-   We will continue him on IV Zosyn  especially given his recent ESBL E. coli UTI. - Will follow urine culture and sensitivity.

## 2024-03-21 NOTE — Plan of Care (Signed)

## 2024-03-21 NOTE — Care Management Obs Status (Signed)
 MEDICARE OBSERVATION STATUS NOTIFICATION   Patient Details  Name: Rick [name] MRN: [id number] Date of Birth: 1941/06/27   Medicare Observation Status Notification Given:  No    Rojelio [name] 03/21/2024, 3:00 PM

## 2024-03-22 DIAGNOSIS — G9341 Metabolic encephalopathy: Secondary | ICD-10-CM | POA: Diagnosis not present

## 2024-03-22 LAB — URINE CULTURE: Culture: NO GROWTH

## 2024-03-22 LAB — GLUCOSE, CAPILLARY
Glucose-Capillary: 152 mg/dL — ABNORMAL HIGH (ref 70–99)
Glucose-Capillary: 149 mg/dL — ABNORMAL HIGH (ref 70–99)
Glucose-Capillary: 190 mg/dL — ABNORMAL HIGH (ref 70–99)

## 2024-03-22 MED ORDER — INSULIN ASPART 100 UNIT/ML IJ SOLN
0.0000 [IU] | Freq: Three times a day (TID) | INTRAMUSCULAR | Status: DC
  Administered 2024-03-22 – 2024-03-23 (×2): 1 [IU] via SUBCUTANEOUS
  Administered 2024-03-23 (×2): 2 [IU] via SUBCUTANEOUS
  Administered 2024-03-24: 1 [IU] via SUBCUTANEOUS
  Administered 2024-03-24: 5 [IU] via SUBCUTANEOUS
  Administered 2024-03-24: 1 [IU] via SUBCUTANEOUS
  Administered 2024-03-24 (×2): 2 [IU] via SUBCUTANEOUS
  Administered 2024-03-24: 5 [IU] via SUBCUTANEOUS
  Administered 2024-03-25: 3 [IU] via SUBCUTANEOUS
  Administered 2024-03-25: 2 [IU] via SUBCUTANEOUS
  Administered 2024-03-25 (×3): 3 [IU] via SUBCUTANEOUS
  Administered 2024-03-25 – 2024-03-26 (×3): 2 [IU] via SUBCUTANEOUS
  Administered 2024-03-26: 1 [IU] via SUBCUTANEOUS
  Administered 2024-03-26 (×2): 2 [IU] via SUBCUTANEOUS
  Administered 2024-03-26: 1 [IU] via SUBCUTANEOUS
  Administered 2024-03-27: 3 [IU] via SUBCUTANEOUS
  Administered 2024-03-27: 2 [IU] via SUBCUTANEOUS
  Administered 2024-03-27: 1 [IU] via SUBCUTANEOUS
  Administered 2024-03-28: 3 [IU] via SUBCUTANEOUS
  Administered 2024-03-28: 2 [IU] via SUBCUTANEOUS
  Administered 2024-03-29: 5 [IU] via SUBCUTANEOUS
  Administered 2024-03-29 – 2024-03-30 (×3): 2 [IU] via SUBCUTANEOUS
  Administered 2024-03-30: 3 [IU] via SUBCUTANEOUS
  Administered 2024-03-31 (×2): 2 [IU] via SUBCUTANEOUS
  Administered 2024-03-31: 3 [IU] via SUBCUTANEOUS
  Administered 2024-04-01 – 2024-04-02 (×5): 2 [IU] via SUBCUTANEOUS
  Administered 2024-04-02: 5 [IU] via SUBCUTANEOUS
  Administered 2024-04-03: 7 [IU] via SUBCUTANEOUS
  Administered 2024-04-03: 5 [IU] via SUBCUTANEOUS
  Administered 2024-04-04: 2 [IU] via SUBCUTANEOUS
  Administered 2024-04-04: 1 [IU] via SUBCUTANEOUS
  Administered 2024-04-05 (×2): 2 [IU] via SUBCUTANEOUS
  Administered 2024-04-06: 1 [IU] via SUBCUTANEOUS
  Administered 2024-04-06: 2 [IU] via SUBCUTANEOUS
  Administered 2024-04-07: 1 [IU] via SUBCUTANEOUS
  Administered 2024-04-07: 3 [IU] via SUBCUTANEOUS
  Administered 2024-04-08: 1 [IU] via SUBCUTANEOUS
  Administered 2024-04-08 – 2024-04-09 (×2): 2 [IU] via SUBCUTANEOUS
  Administered 2024-04-09 – 2024-04-10 (×3): 1 [IU] via SUBCUTANEOUS
  Administered 2024-04-11: 3 [IU] via SUBCUTANEOUS
  Administered 2024-04-11: 1 [IU] via SUBCUTANEOUS
  Administered 2024-04-12: 3 [IU] via SUBCUTANEOUS
  Administered 2024-04-12 – 2024-04-14 (×4): 1 [IU] via SUBCUTANEOUS
  Filled 2024-03-22 (×55): qty 1

## 2024-03-22 MED ORDER — INSULIN ASPART 100 UNIT/ML IJ SOLN
0.0000 [IU] | Freq: Every day | INTRAMUSCULAR | Status: DC
  Administered 2024-03-27: 3 [IU] via SUBCUTANEOUS
  Administered 2024-03-30 – 2024-04-04 (×2): 2 [IU] via SUBCUTANEOUS
  Administered 2024-04-10: 3 [IU] via SUBCUTANEOUS
  Filled 2024-03-22 (×3): qty 1

## 2024-03-22 NOTE — TOC Progression Note (Signed)
 Transition of Care [facility]) - Progression Note    Patient Details  Name: TYKE OUTMAN MRN: [id number] Date of Birth: August 17, 1940  Transition of Care [facility]) CM/SW Contact  [place] L Blas [place], [place] Phone Number: 03/22/2024, 1:12 PM  Clinical Narrative:     CSW contacted and spoke with Glendia Richters, on-call SWS with [facility]. Mr. Richters advised that placement has not been located. FL2 needed. Mr. Richters advised that ALF is the preferred placement for this patient.   After chart review, memory care seems more appropriate. FL2 was completed and signed. CSW completed bed searches for memory care facilities. CSW forwarded the [facility] to Mr. Richters.                     Expected Discharge Plan and Services                                               Social Drivers of Health (SDOH) Interventions SDOH Screenings   Food Insecurity: Patient Declined (03/21/2024)  Housing: Patient Declined (03/21/2024)  Transportation Needs: Patient Declined (03/21/2024)  Utilities: Patient Declined (03/21/2024)  Depression (PHQ2-9): Low Risk  (10/26/2021)  Financial Resource Strain: High Risk (07/17/2022)  Social Connections: Patient Declined (03/21/2024)  Tobacco Use: Medium Risk (03/20/2024)    Readmission Risk Interventions     No data to display
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
## 2024-03-22 NOTE — [place] FL2 (Signed)
 [place]  MEDICAID FL2 LEVEL OF CARE FORM     IDENTIFICATION  Patient Name: Rick [name] Birthdate: 03/08/1941 Sex: male Admission Date (Current Location): 03/20/2024  [facility] and [place] Number:  Chiropodist and Address:  [facility], [address], [place], [place] [address]      Provider Number: [id number]  Attending Physician Name and Address:  Jhonny Calvin NOVAK, MD  Relative Name and Phone Number:  Cathryne Garret [phone]    Current Level of Care: Hospital Recommended Level of Care: Memory Care Prior Approval Number:    Date Approved/Denied:   PASRR Number: [id number]  Discharge Plan:  (ALF)    Current Diagnoses: Patient Active Problem List   Diagnosis Date Noted   Acute metabolic encephalopathy 03/21/2024   Type 2 diabetes mellitus with peripheral neuropathy (HCC) 03/21/2024   Acute lower UTI 03/21/2024   Vitamin B12 deficiency 03/14/2024   Hyponatremia 03/14/2024   Acute delirium 03/12/2024   Parkinsonism (HCC) 03/12/2024   Paroxysmal atrial fibrillation (HCC) 03/12/2024   History of bipolar disorder 03/12/2024   AKI (acute kidney injury) (HCC) 03/12/2024   Tremor 05/28/2018   Urinary tract infection due to extended-spectrum beta lactamase (ESBL) producing Escherichia coli 11/29/2017   Osteoarthritis of multiple joints 08/23/2017   PVD (peripheral vascular disease) (HCC) 05/14/2017   Hard of hearing 12/07/2016   History of tobacco abuse 12/07/2016   History of ulcerative colitis 12/07/2016   Ventral hernia 12/21/2015   History of colon cancer, no staging 12/21/2015   Controlled type 2 diabetes mellitus without complication, without long-term current use of insulin  (HCC) 10/04/2015   History of bladder cancer 08/05/2015   Benign fibroma of prostate 08/05/2015   History of alcoholism (HCC) 08/05/2015   History of migraine headaches 08/05/2015   Calculus of kidney 08/05/2015   Essential hypertension 05/11/2015    Hyperlipidemia 05/11/2015   Affective bipolar disorder (HCC) 03/24/2015   Diabetic peripheral neuropathy associated with type 2 diabetes mellitus (HCC) 03/24/2015   Insomnia 03/24/2015   History of abdominal hernia 02/10/2015   Anxiety and depression 02/10/2015   Generalized anxiety disorder 05/18/2014   Chronic prostatitis 06/12/2013   Herpesviral infection of penis 11/07/2012   Incomplete bladder emptying 11/07/2012   Incisional hernia 11/07/2012   Balanoposthitis 10/31/2012   Benign localized prostatic hyperplasia with lower urinary tract symptoms (LUTS) 10/31/2012   ED (erectile dysfunction) of organic origin 10/31/2012   History of cancer of ureter 10/31/2012    Orientation RESPIRATION BLADDER Height & Weight     Place, Self  Normal External catheter Weight: 175 lb (79.4 kg) Height:  5' 5 (165.1 cm)  BEHAVIORAL SYMPTOMS/MOOD NEUROLOGICAL BOWEL NUTRITION STATUS  Verbally abusive  (Parkinsons) Incontinent Diet (Diet regular Room service appropriate? Yes; Fluid consistency: Thin: General starting at 08/08 0049)  AMBULATORY STATUS COMMUNICATION OF NEEDS Skin   Supervision Verbally (impaired.) Normal (Dry and intact)                       Personal Care Assistance Level of Assistance  Bathing, Feeding, Dressing Bathing Assistance: Limited assistance Feeding assistance: Limited assistance Dressing Assistance: Limited assistance     Functional Limitations Info  Speech     Speech Info: Impaired    SPECIAL CARE FACTORS FREQUENCY  PT (By licensed PT), OT (By licensed OT)     PT Frequency: 3x OT Frequency: 3x            Contractures Contractures Info: Not present  Additional Factors Info  Code Status, Allergies Code Status Info: FULL Allergies Info: Haloperidol; Sulfa Antibiotics           Current Medications (03/22/2024):  This is the current hospital active medication list Current Facility-Administered Medications  Medication Dose Route Frequency  Provider Last Rate Last Admin   acetaminophen  (TYLENOL ) tablet 650 mg  650 mg Oral Q6H PRN Mansy, Jan A, MD   650 mg at 03/22/24 1043   Or   acetaminophen  (TYLENOL ) suppository 650 mg  650 mg Rectal Q6H PRN Mansy, Jan A, MD       albuterol  (PROVENTIL ) (2.5 MG/3ML) 0.083% nebulizer solution 2.5 mg  2.5 mg Nebulization Q6H PRN Dail Rankin RAMAN, RPH       [START ON 03/26/2024] amiodarone  (PACERONE ) tablet 200 mg  200 mg Oral BID Ward, Kristen N, DO       [START ON 04/09/2024] amiodarone  (PACERONE ) tablet 200 mg  200 mg Oral Daily Ward, Kristen N, DO       amiodarone  (PACERONE ) tablet 400 mg  400 mg Oral BID Ward, Kristen N, DO   400 mg at 03/22/24 1042   carbidopa -levodopa  (SINEMET  IR) 25-100 MG per tablet immediate release 1 tablet  1 tablet Oral TID Ward, Kristen N, DO   1 tablet at 03/22/24 1043   divalproex  (DEPAKOTE  ER) 24 hr tablet 500 mg  500 mg Oral Daily Ward, Kristen N, DO   500 mg at 03/22/24 1043   enoxaparin  (LOVENOX ) injection 40 mg  40 mg Subcutaneous Q24H Mansy, Jan A, MD   40 mg at 03/22/24 1042   glipiZIDE  (GLUCOTROL  XL) 24 hr tablet 2.5 mg  2.5 mg Oral Q breakfast Ward, Kristen N, DO   2.5 mg at 03/22/24 1045   magnesium  hydroxide (MILK OF MAGNESIA) suspension 30 mL  30 mL Oral Daily PRN Mansy, Jan A, MD       metoprolol  succinate (TOPROL -XL) 24 hr tablet 12.5 mg  12.5 mg Oral Daily Ward, Kristen N, DO   12.5 mg at 03/22/24 1043   ondansetron  (ZOFRAN ) tablet 4 mg  4 mg Oral Q6H PRN Mansy, Jan A, MD       QUEtiapine  (SEROQUEL ) tablet 25 mg  25 mg Oral QHS Ward, Kristen N, DO   25 mg at 03/21/24 2107   traZODone  (DESYREL ) tablet 25 mg  25 mg Oral QHS PRN Mansy, Jan A, MD   25 mg at 03/21/24 2106   tuberculin injection 5 Units  5 Units Intradermal Once Sreenath, Sudheer B, MD   5 Units at 03/22/24 1056   ziprasidone  (GEODON ) injection 10 mg  10 mg Intramuscular Q6H PRN Jhonny Calvin NOVAK, MD         Discharge Medications: Please see discharge summary for a list of discharge  medications.  Relevant Imaging Results:  Relevant Lab Results:   Additional Information [id number]  [place] L Ramces Shomaker, LCSW

## 2024-03-22 NOTE — Plan of Care (Signed)
  Problem: Clinical Measurements: Goal: Ability to maintain clinical measurements within normal limits will improve Outcome: Progressing   Problem: Elimination: Goal: Will not experience complications related to bowel motility Outcome: Progressing   Problem: Skin Integrity: Goal: Risk for impaired skin integrity will decrease Outcome: Not Progressing

## 2024-03-23 ENCOUNTER — Observation Stay

## 2024-03-23 DIAGNOSIS — G9341 Metabolic encephalopathy: Secondary | ICD-10-CM | POA: Diagnosis not present

## 2024-03-23 LAB — GLUCOSE, CAPILLARY
Glucose-Capillary: 159 mg/dL — ABNORMAL HIGH (ref 70–99)
Glucose-Capillary: 176 mg/dL — ABNORMAL HIGH (ref 70–99)
Glucose-Capillary: 148 mg/dL — ABNORMAL HIGH (ref 70–99)
Glucose-Capillary: 176 mg/dL — ABNORMAL HIGH (ref 70–99)

## 2024-03-23 MED ORDER — DICLOFENAC SODIUM 1 % EX GEL
4.0000 g | Freq: Four times a day (QID) | CUTANEOUS | Status: DC
  Administered 2024-03-23 – 2024-05-02 (×140): 4 g via TOPICAL
  Filled 2024-03-23 (×2): qty 100

## 2024-03-23 NOTE — Plan of Care (Signed)
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
## 2024-03-24 DIAGNOSIS — N390 Urinary tract infection, site not specified: Secondary | ICD-10-CM | POA: Diagnosis not present

## 2024-03-24 DIAGNOSIS — F05 Delirium due to known physiological condition: Secondary | ICD-10-CM | POA: Diagnosis not present

## 2024-03-24 DIAGNOSIS — G9341 Metabolic encephalopathy: Secondary | ICD-10-CM | POA: Diagnosis not present

## 2024-03-24 DIAGNOSIS — G20C Parkinsonism, unspecified: Secondary | ICD-10-CM | POA: Diagnosis not present

## 2024-03-24 LAB — GLUCOSE, CAPILLARY
Glucose-Capillary: 165 mg/dL — ABNORMAL HIGH (ref 70–99)
Glucose-Capillary: 253 mg/dL — ABNORMAL HIGH (ref 70–99)
Glucose-Capillary: 143 mg/dL — ABNORMAL HIGH (ref 70–99)
Glucose-Capillary: 189 mg/dL — ABNORMAL HIGH (ref 70–99)

## 2024-03-24 MED ORDER — ORAL CARE MOUTH RINSE: 15.0000 mL | OROMUCOSAL | Status: DC | PRN
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
## 2024-03-24 NOTE — TOC Progression Note (Signed)
 Transition of Care [facility]) - Progression Note    Patient Details  Name: Rick [name] MRN: [id number] Date of Birth: [birth year]  [facility] CM/SW Contact  [name], RN Phone Number: 03/24/2024, 9:51 AM  Clinical Narrative:     Email sent to Anyia and Kailee with DSS for update on placement status                     Expected Discharge Plan and Services                                               Social Drivers of Health (SDOH) Interventions SDOH Screenings   Food Insecurity: Patient Declined (03/21/2024)  Housing: Patient Declined (03/21/2024)  Transportation Needs: Patient Declined (03/21/2024)  Utilities: Patient Declined (03/21/2024)  Depression (PHQ2-9): Low Risk  (10/26/2021)  Financial Resource Strain: High Risk (07/17/2022)  Social Connections: Patient Declined (03/21/2024)  Tobacco Use: Medium Risk (03/20/2024)    Readmission Risk Interventions     No data to display

## 2024-03-24 NOTE — Plan of Care (Signed)
  Problem: Clinical Measurements: Goal: Ability to maintain clinical measurements within normal limits will improve Outcome: Progressing Goal: Will remain free from infection Outcome: Progressing Goal: Diagnostic test results will improve Outcome: Progressing Goal: Respiratory complications will improve Outcome: Progressing Goal: Cardiovascular complication will be avoided Outcome: Progressing   Problem: Nutrition: Goal: Adequate nutrition will be maintained Outcome: Progressing   Problem: Elimination: Goal: Will not experience complications related to bowel motility Outcome: Progressing Goal: Will not experience complications related to urinary retention Outcome: Progressing   Problem: Pain Managment: Goal: General experience of comfort will improve and/or be controlled Outcome: Progressing   Problem: Safety: Goal: Ability to remain free from injury will improve Outcome: Progressing   Problem: Skin Integrity: Goal: Risk for impaired skin integrity will decrease Outcome: Progressing   Problem: Fluid Volume: Goal: Ability to maintain a balanced intake and output will improve Outcome: Progressing   Problem: Metabolic: Goal: Ability to maintain appropriate glucose levels will improve Outcome: Progressing   Problem: Nutritional: Goal: Maintenance of adequate nutrition will improve Outcome: Progressing Goal: Progress toward achieving an optimal weight will improve Outcome: Progressing   Problem: Skin Integrity: Goal: Risk for impaired skin integrity will decrease Outcome: Progressing   Problem: Tissue Perfusion: Goal: Adequacy of tissue perfusion will improve Outcome: Progressing   Problem: Education: Goal: Knowledge of General Education information will improve Description: Including pain rating scale, medication(s)/side effects and non-pharmacologic comfort measures Outcome: Not Progressing   Problem: Health Behavior/Discharge Planning: Goal: Ability to  manage health-related needs will improve Outcome: Not Progressing   Problem: Activity: Goal: Risk for activity intolerance will decrease Outcome: Not Progressing   Problem: Coping: Goal: Level of anxiety will decrease Outcome: Not Progressing   Problem: Education: Goal: Ability to describe self-care measures that may prevent or decrease complications (Diabetes Survival Skills Education) will improve Outcome: Not Progressing Goal: Individualized Educational Video(s) Outcome: Not Progressing   Problem: Coping: Goal: Ability to adjust to condition or change in health will improve Outcome: Not Progressing   Problem: Health Behavior/Discharge Planning: Goal: Ability to identify and utilize available resources and services will improve Outcome: Not Progressing Goal: Ability to manage health-related needs will improve Outcome: Not Progressing
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
## 2024-03-24 NOTE — Plan of Care (Signed)

## 2024-03-24 NOTE — TOC Progression Note (Signed)
 Transition of Care [facility]) - Progression Note    Patient Details  Name: IGNACIO LOWDER MRN: [id number] Date of Birth: [birth year]  [facility] CM/SW Contact  [name], RN Phone Number: 03/24/2024, 1:59 PM  Clinical Narrative:      Per Jannis with DSS they will work on placement for patient this week.  They go to court on Wednesday to extend the protective order.   Psych to follow up with patient today                    Expected Discharge Plan and Services                                               Social Drivers of Health (SDOH) Interventions SDOH Screenings   Food Insecurity: Patient Declined (03/21/2024)  Housing: Patient Declined (03/21/2024)  Transportation Needs: Patient Declined (03/21/2024)  Utilities: Patient Declined (03/21/2024)  Depression (PHQ2-9): Low Risk  (10/26/2021)  Financial Resource Strain: High Risk (07/17/2022)  Social Connections: Patient Declined (03/21/2024)  Tobacco Use: Medium Risk (03/20/2024)    Readmission Risk Interventions     No data to display

## 2024-03-25 DIAGNOSIS — G9341 Metabolic encephalopathy: Secondary | ICD-10-CM | POA: Diagnosis not present

## 2024-03-25 LAB — GLUCOSE, CAPILLARY
Glucose-Capillary: 160 mg/dL — ABNORMAL HIGH (ref 70–99)
Glucose-Capillary: 210 mg/dL — ABNORMAL HIGH (ref 70–99)
Glucose-Capillary: 242 mg/dL — ABNORMAL HIGH (ref 70–99)
Glucose-Capillary: 141 mg/dL — ABNORMAL HIGH (ref 70–99)

## 2024-03-25 NOTE — TOC Progression Note (Signed)
 Transition of Care [facility]) - Progression Note    Patient Details  Name: Rick [name] MRN: [id number] Date of Birth: [birth year]  Transition of Care [facility]) CM/SW Contact  [name], RN Phone Number: 03/25/2024, 12:09 PM  Clinical Narrative:     Psych note secure emailed to Kailee and Aniya with DSS                    Expected Discharge Plan and Services                                               Social Drivers of Health (SDOH) Interventions SDOH Screenings   Food Insecurity: Patient Declined (03/21/2024)  Housing: Patient Declined (03/21/2024)  Transportation Needs: Patient Declined (03/21/2024)  Utilities: Patient Declined (03/21/2024)  Depression (PHQ2-9): Low Risk  (10/26/2021)  Financial Resource Strain: High Risk (07/17/2022)  Social Connections: Patient Declined (03/21/2024)  Tobacco Use: Medium Risk (03/20/2024)    Readmission Risk Interventions     No data to display

## 2024-03-25 NOTE — Progress Notes (Signed)
 PPD skin test  Placed by RN left forearm 8/9  Read by MD 8/12  No induration.  Negative tuberculin skin test  Calvin Robson MD No charge
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
## 2024-03-25 NOTE — TOC Progression Note (Signed)
 Transition of Care [facility]) - Progression Note    Patient Details  Name: Rick [name] MRN: [id number] Date of Birth: 05-May-1941  [facility]) CM/SW Contact  [name], RN Phone Number: 03/25/2024, 3:23 PM  Clinical Narrative:     TB results send via Email to Kailee and Anyia with [facility] DSS                    Expected Discharge Plan and Services                                               Social Drivers of Health (SDOH) Interventions SDOH Screenings   Food Insecurity: Patient Declined (03/21/2024)  Housing: Patient Declined (03/21/2024)  Transportation Needs: Patient Declined (03/21/2024)  Utilities: Patient Declined (03/21/2024)  Depression (PHQ2-9): Low Risk  (10/26/2021)  Financial Resource Strain: High Risk (07/17/2022)  Social Connections: Patient Declined (03/21/2024)  Tobacco Use: Medium Risk (03/20/2024)    Readmission Risk Interventions     No data to display

## 2024-03-26 DIAGNOSIS — G9341 Metabolic encephalopathy: Secondary | ICD-10-CM | POA: Diagnosis not present

## 2024-03-26 LAB — GLUCOSE, CAPILLARY
Glucose-Capillary: 149 mg/dL — ABNORMAL HIGH (ref 70–99)
Glucose-Capillary: 179 mg/dL — ABNORMAL HIGH (ref 70–99)
Glucose-Capillary: 172 mg/dL — ABNORMAL HIGH (ref 70–99)
Glucose-Capillary: 191 mg/dL — ABNORMAL HIGH (ref 70–99)
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
## 2024-03-26 NOTE — TOC Progression Note (Addendum)
 Transition of Care [facility]) - Progression Note    Patient Details  Name: Rick [name] MRN: [id number] Date of Birth: [birth year]  Transition of [facility]) CM/SW Contact  Marinda Cooks, RN Phone Number: 03/26/2024, 11:34 AM  Clinical Narrative:    This CM attempted to reach DSS worker Jannis [phone] for updates regarding dc planning and was sent to VM. Also secure email sent to [facility] in [place] to receive updates on pt's medicaid status informed pt's account is still under review with [employer]. TOC will cont to follow dc planning / dc coordination and update as applicable.   16:45: This CM rec'd call back from pt's appointed DSS SW Rick [name] at [phone] and was updated she went to court on pt's behalf today , pt remains under protective custody with [place] . Next court date is scheduled for 06/04/24 . Ms.Rick [name] informed her agency is continuing to coordinate  a safe dc plan that  includes pursuing placement for pt at dc. TOC will cont to follow dc planning / care coordination and update as applicable      Expected Discharge Plan and Services    DSS involved pt has assigned DSS SW . DC  plan pt to dc to SNF    Social Drivers of Health (SDOH) Interventions SDOH Screenings   Food Insecurity: Patient Declined (03/21/2024)  Housing: Patient Declined (03/21/2024)  Transportation Needs: Patient Declined (03/21/2024)  Utilities: Patient Declined (03/21/2024)  Depression (PHQ2-9): Low Risk  (10/26/2021)  Financial Resource Strain: High Risk (07/17/2022)  Social Connections: Patient Declined (03/21/2024)  Tobacco Use: Medium Risk (03/20/2024)    Readmission Risk Interventions     No data to display

## 2024-03-27 DIAGNOSIS — G9341 Metabolic encephalopathy: Secondary | ICD-10-CM | POA: Diagnosis not present

## 2024-03-27 LAB — GLUCOSE, CAPILLARY
Glucose-Capillary: 151 mg/dL — ABNORMAL HIGH (ref 70–99)
Glucose-Capillary: 234 mg/dL — ABNORMAL HIGH (ref 70–99)
Glucose-Capillary: 145 mg/dL — ABNORMAL HIGH (ref 70–99)
Glucose-Capillary: 254 mg/dL — ABNORMAL HIGH (ref 70–99)
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
## 2024-03-27 NOTE — Plan of Care (Signed)

## 2024-03-27 NOTE — Plan of Care (Signed)

## 2024-03-28 DIAGNOSIS — G9341 Metabolic encephalopathy: Secondary | ICD-10-CM | POA: Diagnosis not present

## 2024-03-28 LAB — GLUCOSE, CAPILLARY
Glucose-Capillary: 231 mg/dL — ABNORMAL HIGH (ref 70–99)
Glucose-Capillary: 118 mg/dL — ABNORMAL HIGH (ref 70–99)
Glucose-Capillary: 164 mg/dL — ABNORMAL HIGH (ref 70–99)
Glucose-Capillary: 138 mg/dL — ABNORMAL HIGH (ref 70–99)

## 2024-03-28 NOTE — Plan of Care (Signed)

## 2024-03-28 NOTE — Progress Notes (Signed)
 Patient's brother's arrived to the unit to visit around 315p. I was made aware by the Nurse Techs that patient's brother was in the room trying to talk with him about financial information and stated they had papers for him to  review and sign.   Darina Space (Brother) and another brother that came with Darina stated to the Nurse Techs that he was told by DSS Case Manager Jannis that he was to discuss and bring in papers for patient to sign concerning patient's financials.  I entered the room to find out what was going on and spoke with both brothers and asked them exactly  what was the nature of the visit that warrants them speaking to patient about financials.  Darina stated that he was told by DSS Case Manager Jannis TODAY that he was to come up here to the hospital and speak to the patient about signing papers about financials and other funds. I told them to let me check the chart for any communication from treatment team regarding what he was telling me.  After scanning the charts and reading all notes from this week, I was unable to find any information verifying this information he was stating.  EPIC Chat sent to Case Manager Supervisor Delphine Bring and LCSW [place] asking if they were aware of any of this information. I was told that she would reach out to DSS Case Manager Jannis to confirm if any of the statements made by Darina were true.  Brother was starting to get irritated and angry stating  Why am I being questioned?  I was told to come up here by Jannis and get this information! Hospital Security was called to deescalate the situation with both brother's being asked to leave the hospital.  Response from [facility] [place] sent a response from [employer] regarding the situation: ( SEE MESSAGE BELOW):  Good Afternoon [place],   There are no restrictions with family and The Department would not restrict visitation unless it becomes an issue.  However, Mr. Lipari is not allowed to  sign anything during this time and he should not be providing authority to manage his funds.   We will speak with the family.   -Jannis   Patient's sister called the unit and made aware of what was going on being both brother's went home and told her that they were banned from the hospital and  no family member was allowed back to visit.  I reassure her that the information was false and she was able to come and visit her brother as there are no visitor restrictions at this time.  2C Nursing Director and Nurse Manager made aware
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
## 2024-03-29 DIAGNOSIS — G9341 Metabolic encephalopathy: Secondary | ICD-10-CM | POA: Diagnosis not present

## 2024-03-29 LAB — GLUCOSE, CAPILLARY
Glucose-Capillary: 191 mg/dL — ABNORMAL HIGH (ref 70–99)
Glucose-Capillary: 258 mg/dL — ABNORMAL HIGH (ref 70–99)
Glucose-Capillary: 165 mg/dL — ABNORMAL HIGH (ref 70–99)
Glucose-Capillary: 138 mg/dL — ABNORMAL HIGH (ref 70–99)

## 2024-03-29 MED ORDER — QUETIAPINE FUMARATE 25 MG PO TABS
50.0000 mg | ORAL_TABLET | Freq: Every day | ORAL | Status: DC
  Administered 2024-03-29 – 2024-04-02 (×5): 50 mg via ORAL
  Filled 2024-03-29 (×5): qty 2

## 2024-03-29 MED ORDER — ACETAMINOPHEN 500 MG PO TABS
1000.0000 mg | ORAL_TABLET | Freq: Three times a day (TID) | ORAL | Status: DC | PRN
  Administered 2024-03-30 – 2024-04-25 (×8): 1000 mg via ORAL
  Filled 2024-03-29 (×9): qty 2

## 2024-03-29 NOTE — Plan of Care (Signed)
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
## 2024-03-30 DIAGNOSIS — G9341 Metabolic encephalopathy: Secondary | ICD-10-CM | POA: Diagnosis not present

## 2024-03-30 LAB — GLUCOSE, CAPILLARY
Glucose-Capillary: 157 mg/dL — ABNORMAL HIGH (ref 70–99)
Glucose-Capillary: 202 mg/dL — ABNORMAL HIGH (ref 70–99)
Glucose-Capillary: 96 mg/dL (ref 70–99)
Glucose-Capillary: 206 mg/dL — ABNORMAL HIGH (ref 70–99)

## 2024-03-30 NOTE — Progress Notes (Signed)
 Trial. No tele sitter.
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
## 2024-03-30 NOTE — Plan of Care (Signed)

## 2024-03-31 DIAGNOSIS — G9341 Metabolic encephalopathy: Secondary | ICD-10-CM | POA: Diagnosis not present

## 2024-03-31 LAB — GLUCOSE, CAPILLARY
Glucose-Capillary: 155 mg/dL — ABNORMAL HIGH (ref 70–99)
Glucose-Capillary: 223 mg/dL — ABNORMAL HIGH (ref 70–99)
Glucose-Capillary: 181 mg/dL — ABNORMAL HIGH (ref 70–99)
Glucose-Capillary: 173 mg/dL — ABNORMAL HIGH (ref 70–99)

## 2024-03-31 LAB — CBC
HCT: 38.9 % — ABNORMAL LOW (ref 39.0–52.0)
Hemoglobin: 12.7 g/dL — ABNORMAL LOW (ref 13.0–17.0)
MCH: 28 pg (ref 26.0–34.0)
MCHC: 32.6 g/dL (ref 30.0–36.0)
MCV: 85.7 fL (ref 80.0–100.0)
Platelets: 273 K/uL (ref 150–400)
RBC: 4.54 MIL/uL (ref 4.22–5.81)
RDW: 14.6 % (ref 11.5–15.5)
WBC: 9.4 K/uL (ref 4.0–10.5)
nRBC: 0 % (ref 0.0–0.2)

## 2024-03-31 LAB — BASIC METABOLIC PANEL WITH GFR
Anion gap: 11 (ref 5–15)
BUN: 30 mg/dL — ABNORMAL HIGH (ref 8–23)
CO2: 22 mmol/L (ref 22–32)
Calcium: 8.9 mg/dL (ref 8.9–10.3)
Chloride: 103 mmol/L (ref 98–111)
Creatinine, Ser: 1.35 mg/dL — ABNORMAL HIGH (ref 0.61–1.24)
GFR, Estimated: 52 mL/min — ABNORMAL LOW (ref >60)
Glucose, Bld: 139 mg/dL — ABNORMAL HIGH (ref 70–99)
Potassium: 4.2 mmol/L (ref 3.5–5.1)
Sodium: 136 mmol/L (ref 135–145)

## 2024-03-31 LAB — MAGNESIUM: Magnesium: 1.9 mg/dL (ref 1.7–2.4)

## 2024-03-31 MED ORDER — HALOPERIDOL LACTATE 5 MG/ML IJ SOLN
2.0000 mg | Freq: Four times a day (QID) | INTRAMUSCULAR | Status: DC | PRN
  Administered 2024-04-02 – 2024-04-17 (×2): 2 mg via INTRAVENOUS
  Filled 2024-03-31 (×2): qty 1

## 2024-03-31 NOTE — Plan of Care (Signed)
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
## 2024-03-31 NOTE — TOC Progression Note (Signed)
 Transition of Care [facility]) - Progression Note    Patient Details  Name: Rick [name] MRN: [id number] Date of Birth: 04-21-1941  Transition of Care [facility]) CM/SW Contact  [name], RN Phone Number: 03/31/2024, 4:27 PM  Clinical Narrative:      PPD results secure emailed to Kailee and Aniya with DSS, also requested update on placement status                    Expected Discharge Plan and Services                                               Social Drivers of Health (SDOH) Interventions SDOH Screenings   Food Insecurity: Patient Declined (03/21/2024)  Housing: Patient Declined (03/21/2024)  Transportation Needs: Patient Declined (03/21/2024)  Utilities: Patient Declined (03/21/2024)  Depression (PHQ2-9): Low Risk  (10/26/2021)  Financial Resource Strain: High Risk (07/17/2022)  Social Connections: Patient Declined (03/21/2024)  Tobacco Use: Medium Risk (03/20/2024)    Readmission Risk Interventions     No data to display

## 2024-03-31 NOTE — Progress Notes (Signed)
 Patient bed alarm went off at 1610. Staff went into patients room and found patient beside his bed on his knees and it appeared that he was praying. Author assessed patient for injuries. Patient denies any pain, dizziness, headache, chest pain, or hitting head. Patient is Alert & oriented x2 which is his baseline. No loss of consciousness noted or reported. No visible bruising, lacerations, or swelling noted. Vital signs obtained and documented.   T-66F B/P- 92/71  HR-83 SPO2- 96 RR- 18  Patient assisted back to bed using gate belt. Linen and gown changed. Patient appears agitated, anxious, and restless. PRN Ziprasidone  1mg  IM given. Falls precautions reinforced. Call bell within reach, non skid socks on patient, mat on floor, bed alarm on, patient education provided urinal within reach. Dr. Awanda made aware.   Patient resting comfortably in room. Family updated about fall.

## 2024-03-31 NOTE — Progress Notes (Signed)
 Post Fall Documentation     03/31/24 1610  What Happened  Was fall witnessed? No  Was patient injured? No  Patient found on floor  Found by Staff-comment (Patient found on knees praying besdie bed)  Stated prior activity to/from bed, chair, or stretcher  Provider Notification  Provider Name/Title Dr. Awanda, MD  Date Provider Notified 03/31/24  Time Provider Notified 1627  Method of Notification Page  Notification Reason Fall  Provider response At bedside  Date of Provider Response 03/31/24  Time of Provider Response 1620  Follow Up  Family notified Yes - comment  Time family notified 1653  Additional tests No  Simple treatment Other (comment) (None)  Progress note created (see row info) Yes  Adult Fall Risk Assessment  Risk Factor Category (scoring not indicated) Fall has occurred during this admission (document High fall risk)  Patient Fall Risk Level High fall risk  Adult Fall Risk Interventions  Required Bundle Interventions *See Row Information* High fall risk - low, moderate, and high requirements implemented  Fall intervention(s) refused/Patient educated regarding refusal Bed alarm  Screening for Fall Injury Risk (To be completed on HIGH fall risk patients) - Assessing Need for Floor Mats  Risk For Fall Injury- Criteria for Floor Mats Noncompliant with safety precautions;Confusion/dementia (+NuDESC, CIWA, TBI, etc.)  Vitals  Temp 98 F (36.7 C)  Temp Source Oral  BP 92/71  MAP (mmHg) 79  BP Location Right Arm  BP Method Automatic  Patient Position (if appropriate) Lying  Pulse Rate 83  Pulse Rate Source Monitor  Resp 20  Oxygen Therapy  SpO2 96 %  O2 Device Room Air  Patient Activity (if Appropriate) In bed  Pain Assessment  Pain Scale 0-10  Pain Score 0  PAINAD (Pain Assessment in Advanced Dementia)  Breathing 0  Negative Vocalization 0  Facial Expression 0  Body Language 0  Consolability 0  PAINAD Score 0  PCA/Epidural/Spinal Assessment  Respiratory  Pattern Regular  Neurological  Neuro (WDL) X  Level of Consciousness Alert  Orientation Level Oriented to person;Disoriented to time;Disoriented to situation;Oriented to place  Cognition Poor judgement;Poor safety awareness  Speech Clear  R Pupil Size (mm) 3  R Pupil Shape Round  R Pupil Reaction Brisk  L Pupil Size (mm) 3  L Pupil Shape Round  L Pupil Reaction Brisk  Motor Function/Sensation Assessment Head;Grip  Facial Symmetry Symmetrical  R Hand Grip Moderate  L Hand Grip Moderate  R Elbow Extension (Push/Biceps) Moderate  L Elbow Extension (Push/Biceps) Moderate  R Foot Dorsiflexion Moderate  L Foot Dorsiflexion Moderate  R Foot Plantar Flexion Moderate  L Foot Plantar Flexion Moderate  RUE Motor Response Responds to commands  RUE Motor Strength 4  LUE Motor Response Responds to commands  LUE Motor Strength 5  RLE Motor Response Responds to commands  RLE Motor Strength 5  LLE Motor Response Responds to commands  LLE Motor Strength 5  Neuro Symptoms Anxiety;Agitation;Forgetful  Neuro symptoms relieved by Relaxation techniques (Comment);Other (Comment) (PRN Ziprasidone )  Glasgow Coma Scale  Eye Opening 4  Best Verbal Response (NON-intubated) 4  Best Motor Response 6  Musculoskeletal  Musculoskeletal (WDL) X  Generalized Weakness Yes  Weight Bearing Restrictions Per Provider Order No  Musculoskeletal Details  RUE Weakness  LUE Weakness  RLE Weakness  LLE Weakness  Integumentary  Integumentary (WDL) X  Skin Color Appropriate for ethnicity  Skin Condition Dry  Skin Integrity Abrasion;Ecchymosis  Abrasion Location Knee  Abrasion Location Orientation Bilateral  Ecchymosis Location Leg  Ecchymosis Location Orientation Bilateral  Erythema/Redness Location Hip  Skin Turgor Non-tenting  Pain Assessment  Result of Injury No

## 2024-03-31 NOTE — Plan of Care (Signed)

## 2024-04-01 DIAGNOSIS — G9341 Metabolic encephalopathy: Secondary | ICD-10-CM | POA: Diagnosis not present

## 2024-04-01 LAB — GLUCOSE, CAPILLARY
Glucose-Capillary: 187 mg/dL — ABNORMAL HIGH (ref 70–99)
Glucose-Capillary: 183 mg/dL — ABNORMAL HIGH (ref 70–99)
Glucose-Capillary: 155 mg/dL — ABNORMAL HIGH (ref 70–99)
Glucose-Capillary: 137 mg/dL — ABNORMAL HIGH (ref 70–99)

## 2024-04-01 LAB — BASIC METABOLIC PANEL WITH GFR
Anion gap: 9 (ref 5–15)
BUN: 30 mg/dL — ABNORMAL HIGH (ref 8–23)
CO2: 23 mmol/L (ref 22–32)
Calcium: 9.1 mg/dL (ref 8.9–10.3)
Chloride: 105 mmol/L (ref 98–111)
Creatinine, Ser: 1.12 mg/dL (ref 0.61–1.24)
GFR, Estimated: >60 mL/min (ref >60)
Glucose, Bld: 146 mg/dL — ABNORMAL HIGH (ref 70–99)
Potassium: 4.1 mmol/L (ref 3.5–5.1)
Sodium: 137 mmol/L (ref 135–145)

## 2024-04-01 LAB — CBC
HCT: 39.4 % (ref 39.0–52.0)
Hemoglobin: 13.1 g/dL (ref 13.0–17.0)
MCH: 28.2 pg (ref 26.0–34.0)
MCHC: 33.2 g/dL (ref 30.0–36.0)
MCV: 84.9 fL (ref 80.0–100.0)
Platelets: 260 K/uL (ref 150–400)
RBC: 4.64 MIL/uL (ref 4.22–5.81)
RDW: 14.7 % (ref 11.5–15.5)
WBC: 9.8 K/uL (ref 4.0–10.5)
nRBC: 0 % (ref 0.0–0.2)

## 2024-04-01 LAB — MAGNESIUM: Magnesium: 1.7 mg/dL (ref 1.7–2.4)

## 2024-04-01 MED ORDER — GLIPIZIDE ER 5 MG PO TB24
5.0000 mg | ORAL_TABLET | Freq: Every day | ORAL | Status: DC
Start: 2024-04-02 — End: 2024-05-03
  Administered 2024-04-02 – 2024-05-03 (×32): 5 mg via ORAL
  Filled 2024-04-01 (×33): qty 1
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
## 2024-04-02 DIAGNOSIS — G9341 Metabolic encephalopathy: Secondary | ICD-10-CM | POA: Diagnosis not present

## 2024-04-02 LAB — CBC
HCT: 39.3 % (ref 39.0–52.0)
Hemoglobin: 13.1 g/dL (ref 13.0–17.0)
MCH: 28.7 pg (ref 26.0–34.0)
MCHC: 33.3 g/dL (ref 30.0–36.0)
MCV: 86 fL (ref 80.0–100.0)
Platelets: 264 K/uL (ref 150–400)
RBC: 4.57 MIL/uL (ref 4.22–5.81)
RDW: 14.6 % (ref 11.5–15.5)
WBC: 8.6 K/uL (ref 4.0–10.5)
nRBC: 0 % (ref 0.0–0.2)

## 2024-04-02 LAB — BASIC METABOLIC PANEL WITH GFR
Anion gap: 8 (ref 5–15)
BUN: 33 mg/dL — ABNORMAL HIGH (ref 8–23)
CO2: 23 mmol/L (ref 22–32)
Calcium: 9 mg/dL (ref 8.9–10.3)
Chloride: 105 mmol/L (ref 98–111)
Creatinine, Ser: 1.16 mg/dL (ref 0.61–1.24)
GFR, Estimated: >60 mL/min (ref >60)
Glucose, Bld: 183 mg/dL — ABNORMAL HIGH (ref 70–99)
Potassium: 4.1 mmol/L (ref 3.5–5.1)
Sodium: 136 mmol/L (ref 135–145)

## 2024-04-02 LAB — GLUCOSE, CAPILLARY
Glucose-Capillary: 279 mg/dL — ABNORMAL HIGH (ref 70–99)
Glucose-Capillary: 172 mg/dL — ABNORMAL HIGH (ref 70–99)
Glucose-Capillary: 168 mg/dL — ABNORMAL HIGH (ref 70–99)
Glucose-Capillary: 146 mg/dL — ABNORMAL HIGH (ref 70–99)

## 2024-04-02 LAB — MAGNESIUM: Magnesium: 2 mg/dL (ref 1.7–2.4)

## 2024-04-02 NOTE — TOC Progression Note (Signed)
 Transition of Care [facility]) - Progression Note    Patient Details  Name: ARAN MENNING MRN: [id number] Date of Birth: 1940-11-26  Transition [facility] Care [facility]) CM/SW Contact  [name], RN Phone Number: 04/02/2024, 11:56 AM  Clinical Narrative:     Per Terrilyn with  DSS patient is still under protective order - Family is able to come to visit.  She states they are planning to reach out to [facility] before coming to the hospital.  [facility] is also to update family as needed  DSS continues to work on placement                     Expected Discharge Plan and Services                                               Social Drivers of Health (SDOH) Interventions SDOH Screenings   Food Insecurity: Patient Declined (03/21/2024)  Housing: Patient Declined (03/21/2024)  Transportation Needs: Patient Declined (03/21/2024)  Utilities: Patient Declined (03/21/2024)  Depression (PHQ2-9): Low Risk  (10/26/2021)  Financial Resource Strain: High Risk (07/17/2022)  Social Connections: Patient Declined (03/21/2024)  Tobacco Use: Medium Risk (03/20/2024)    Readmission Risk Interventions     No data to display

## 2024-04-02 NOTE — Progress Notes (Signed)
 Mobility Specialist - Progress Note   04/02/24 1034  Mobility  Activity Ambulated with assistance;Pivoted/transferred to/from BSC;Stood at bedside  Level of Assistance Contact guard assist, steadying assist  Assistive Device None  Distance Ambulated (ft) 6 ft  Activity Response Tolerated well  Mobility visit 1 Mobility  Mobility Specialist Start Time (ACUTE ONLY) 1013  Mobility Specialist Stop Time (ACUTE ONLY) 1028  Mobility Specialist Time Calculation (min) (ACUTE ONLY) 15 min   Pt yelling out when passing by, requesting for a new purewick. Pt supine upon entry, utilizing RA. Pt completed bed mob MinA, STS from EOB and transferred to the [facility] CGA. Pt sat on the [facility] while MS assist with bath and donning new gown MaxA. Pt STS from [facility] and returned to bed CGA-MinG. Pt left supine with alarm set and needs within reach. NT notified.  America Silvan Mobility Specialist 04/02/24 10:51 AM
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
## 2024-04-02 NOTE — Progress Notes (Signed)
 Patient gradually becoming more agitated stating he's ready to get out of here and go home and we are holding him against his will. Patient began kicking his feet up towards writer and cussing and states he can walk out if he wants to. MD made aware. PRN Haldol  given for agitation.

## 2024-04-03 DIAGNOSIS — G9341 Metabolic encephalopathy: Secondary | ICD-10-CM | POA: Diagnosis not present

## 2024-04-03 LAB — CBC
HCT: 39.8 % (ref 39.0–52.0)
Hemoglobin: 13.4 g/dL (ref 13.0–17.0)
MCH: 28.8 pg (ref 26.0–34.0)
MCHC: 33.7 g/dL (ref 30.0–36.0)
MCV: 85.6 fL (ref 80.0–100.0)
Platelets: 261 K/uL (ref 150–400)
RBC: 4.65 MIL/uL (ref 4.22–5.81)
RDW: 14.5 % (ref 11.5–15.5)
WBC: 10.5 K/uL (ref 4.0–10.5)
nRBC: 0 % (ref 0.0–0.2)

## 2024-04-03 LAB — GLUCOSE, CAPILLARY
Glucose-Capillary: 343 mg/dL — ABNORMAL HIGH (ref 70–99)
Glucose-Capillary: 282 mg/dL — ABNORMAL HIGH (ref 70–99)
Glucose-Capillary: 77 mg/dL (ref 70–99)
Glucose-Capillary: 169 mg/dL — ABNORMAL HIGH (ref 70–99)

## 2024-04-03 LAB — BASIC METABOLIC PANEL WITH GFR
Anion gap: 8 (ref 5–15)
BUN: 27 mg/dL — ABNORMAL HIGH (ref 8–23)
CO2: 24 mmol/L (ref 22–32)
Calcium: 9 mg/dL (ref 8.9–10.3)
Chloride: 105 mmol/L (ref 98–111)
Creatinine, Ser: 1.07 mg/dL (ref 0.61–1.24)
GFR, Estimated: >60 mL/min (ref >60)
Glucose, Bld: 108 mg/dL — ABNORMAL HIGH (ref 70–99)
Potassium: 4.1 mmol/L (ref 3.5–5.1)
Sodium: 137 mmol/L (ref 135–145)

## 2024-04-03 LAB — MAGNESIUM: Magnesium: 2 mg/dL (ref 1.7–2.4)

## 2024-04-03 MED ORDER — DIVALPROEX SODIUM ER 250 MG PO TB24
750.0000 mg | ORAL_TABLET | Freq: Every day | ORAL | Status: DC
  Administered 2024-04-04 – 2024-05-03 (×30): 750 mg via ORAL
  Filled 2024-04-03 (×30): qty 3

## 2024-04-03 MED ORDER — QUETIAPINE FUMARATE 25 MG PO TABS
50.0000 mg | ORAL_TABLET | Freq: Three times a day (TID) | ORAL | Status: DC
  Administered 2024-04-03 – 2024-05-03 (×89): 50 mg via ORAL
  Filled 2024-04-03 (×90): qty 2
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
## 2024-04-03 NOTE — Progress Notes (Signed)
 Mobility Specialist - Progress Note   04/03/24 1611  Mobility  Activity Dangled on edge of bed;Stood at bedside;Pivoted/transferred to/from [facility]  Level of Assistance Contact guard assist, steadying assist  Assistive Device None  Distance Ambulated (ft) 8 ft  Activity Response Tolerated well  Mobility visit 1 Mobility  Mobility Specialist Start Time (ACUTE ONLY) 1436  Mobility Specialist Stop Time (ACUTE ONLY) 1509  Mobility Specialist Time Calculation (min) (ACUTE ONLY) 33 min   Pt supine upon entry, utilizing RA--- soiled linen. Pt completed bed mob ModI, STS from EOB and transferred to the [facility] CGA HHA. Pt sat on the [facility] while MS completed linen change, doffs gown, washes Pt and dons clean gown MaxA. Pt STS from [facility], stood at bedside while MS completed peri care MaxA and transfers to bed. Pt left supine with alarm set and needs within reach.  America Silvan Mobility Specialist 04/03/24 4:14 PM

## 2024-04-04 DIAGNOSIS — G9341 Metabolic encephalopathy: Secondary | ICD-10-CM | POA: Diagnosis not present

## 2024-04-04 LAB — BASIC METABOLIC PANEL WITH GFR
Anion gap: 9 (ref 5–15)
BUN: 28 mg/dL — ABNORMAL HIGH (ref 8–23)
CO2: 24 mmol/L (ref 22–32)
Calcium: 8.7 mg/dL — ABNORMAL LOW (ref 8.9–10.3)
Chloride: 102 mmol/L (ref 98–111)
Creatinine, Ser: 1.14 mg/dL (ref 0.61–1.24)
GFR, Estimated: >60 mL/min (ref >60)
Glucose, Bld: 140 mg/dL — ABNORMAL HIGH (ref 70–99)
Potassium: 4.3 mmol/L (ref 3.5–5.1)
Sodium: 135 mmol/L (ref 135–145)

## 2024-04-04 LAB — GLUCOSE, CAPILLARY
Glucose-Capillary: 117 mg/dL — ABNORMAL HIGH (ref 70–99)
Glucose-Capillary: 196 mg/dL — ABNORMAL HIGH (ref 70–99)
Glucose-Capillary: 135 mg/dL — ABNORMAL HIGH (ref 70–99)
Glucose-Capillary: 201 mg/dL — ABNORMAL HIGH (ref 70–99)

## 2024-04-04 LAB — CBC
HCT: 38.1 % — ABNORMAL LOW (ref 39.0–52.0)
Hemoglobin: 12.6 g/dL — ABNORMAL LOW (ref 13.0–17.0)
MCH: 28.4 pg (ref 26.0–34.0)
MCHC: 33.1 g/dL (ref 30.0–36.0)
MCV: 86 fL (ref 80.0–100.0)
Platelets: 253 10*3/uL (ref 150–400)
RBC: 4.43 MIL/uL (ref 4.22–5.81)
RDW: 14.5 % (ref 11.5–15.5)
WBC: 8.4 10*3/uL (ref 4.0–10.5)
nRBC: 0 % (ref 0.0–0.2)

## 2024-04-04 LAB — MAGNESIUM: Magnesium: 1.9 mg/dL (ref 1.7–2.4)
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
## 2024-04-04 NOTE — Plan of Care (Signed)

## 2024-04-04 NOTE — Plan of Care (Signed)
   Problem: Education: Goal: Knowledge of General Education information will improve Description: Including pain rating scale, medication(s)/side effects and non-pharmacologic comfort measures Outcome: Progressing   Problem: Health Behavior/Discharge Planning: Goal: Ability to manage health-related needs will improve Outcome: Progressing   Problem: Clinical Measurements: Goal: Will remain free from infection Outcome: Progressing Goal: Diagnostic test results will improve Outcome: Progressing Goal: Respiratory complications will improve Outcome: Progressing Goal: Cardiovascular complication will be avoided Outcome: Progressing   Problem: Nutrition: Goal: Adequate nutrition will be maintained Outcome: Progressing   Problem: Coping: Goal: Level of anxiety will decrease Outcome: Progressing

## 2024-04-04 NOTE — TOC Progression Note (Addendum)
 Transition of Care [facility]) - Progression Note    Patient Details  Name: Rick [name] MRN: [id number] Date of Birth: [birth year]  [facility] CM/SW Contact  Rick KANDICE Bring, RN Phone Number: 04/04/2024, 10:15 AM  Clinical Narrative:    CM spoke with  patient's DSS SW, Rick [name] on 04/03/24. She states [facility]  had assess him but they states that  patient was very verbally aggressive . They felt that patient need medication adjustment. They have a protective order which was extended to 06/04/24.   Cm had reached out to Dr. Jearldine about  about psych consult for medication adjustment.   Dr. Donnelly informed the team this morning that increased his seroquel  to TID and need to monitor for excessive sedation . Increased Depakote  to 750. MD states that patient is hard of hearing. He has no hearing aids at the hospital.  CM notified patient's DSS Sw of the medication changes.   1040 DSS Manager, Rick [name] informed CM that they are award of patient hearing impairment. They plan looking into getting patient hearing aids when stabilized. She states that they may have a hearing assistance device that they will [name] to use when they talk with hi, . CM notified MD and nursing.                      Expected Discharge Plan and Services         Expected Discharge Date: 04/03/24                                     Social Drivers of Health (SDOH) Interventions SDOH Screenings   Food Insecurity: Patient Declined (03/21/2024)  Housing: Patient Declined (03/21/2024)  Transportation Needs: Patient Declined (03/21/2024)  Utilities: Patient Declined (03/21/2024)  Depression (PHQ2-9): Low Risk  (10/26/2021)  Financial Resource Strain: High Risk (07/17/2022)  Social Connections: Patient Declined (03/21/2024)  Tobacco Use: Medium Risk (03/20/2024)    Readmission Risk Interventions     No data to display

## 2024-04-05 DIAGNOSIS — G9341 Metabolic encephalopathy: Secondary | ICD-10-CM | POA: Diagnosis not present

## 2024-04-05 LAB — GLUCOSE, CAPILLARY
Glucose-Capillary: 152 mg/dL — ABNORMAL HIGH (ref 70–99)
Glucose-Capillary: 163 mg/dL — ABNORMAL HIGH (ref 70–99)
Glucose-Capillary: 93 mg/dL (ref 70–99)
Glucose-Capillary: 181 mg/dL — ABNORMAL HIGH (ref 70–99)
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
## 2024-04-06 DIAGNOSIS — G9341 Metabolic encephalopathy: Secondary | ICD-10-CM | POA: Diagnosis not present

## 2024-04-06 LAB — GLUCOSE, CAPILLARY
Glucose-Capillary: 149 mg/dL — ABNORMAL HIGH (ref 70–99)
Glucose-Capillary: 151 mg/dL — ABNORMAL HIGH (ref 70–99)
Glucose-Capillary: 114 mg/dL — ABNORMAL HIGH (ref 70–99)
Glucose-Capillary: 134 mg/dL — ABNORMAL HIGH (ref 70–99)
Glucose-Capillary: 176 mg/dL — ABNORMAL HIGH (ref 70–99)
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
## 2024-04-06 NOTE — Plan of Care (Signed)
  Problem: Education: Goal: Knowledge of General Education information will improve Description: Including pain rating scale, medication(s)/side effects and non-pharmacologic comfort measures Outcome: Progressing   Problem: Clinical Measurements: Goal: Ability to maintain clinical measurements within normal limits will improve Outcome: Progressing Goal: Will remain free from infection Outcome: Progressing   Problem: Coping: Goal: Level of anxiety will decrease Outcome: Progressing   Problem: Elimination: Goal: Will not experience complications related to urinary retention Outcome: Progressing   Problem: Pain Managment: Goal: General experience of comfort will improve and/or be controlled Outcome: Progressing   Problem: Coping: Goal: Ability to adjust to condition or change in health will improve Outcome: Progressing   Problem: Nutritional: Goal: Maintenance of adequate nutrition will improve Outcome: Progressing   Problem: Tissue Perfusion: Goal: Adequacy of tissue perfusion will improve Outcome: Progressing

## 2024-04-07 DIAGNOSIS — G9341 Metabolic encephalopathy: Secondary | ICD-10-CM | POA: Diagnosis not present

## 2024-04-07 LAB — GLUCOSE, CAPILLARY
Glucose-Capillary: 128 mg/dL — ABNORMAL HIGH (ref 70–99)
Glucose-Capillary: 222 mg/dL — ABNORMAL HIGH (ref 70–99)
Glucose-Capillary: 122 mg/dL — ABNORMAL HIGH (ref 70–99)
Glucose-Capillary: 165 mg/dL — ABNORMAL HIGH (ref 70–99)
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
## 2024-04-07 NOTE — Plan of Care (Signed)

## 2024-04-08 DIAGNOSIS — G9341 Metabolic encephalopathy: Secondary | ICD-10-CM | POA: Diagnosis not present

## 2024-04-08 LAB — GLUCOSE, CAPILLARY
Glucose-Capillary: 123 mg/dL — ABNORMAL HIGH (ref 70–99)
Glucose-Capillary: 145 mg/dL — ABNORMAL HIGH (ref 70–99)
Glucose-Capillary: 136 mg/dL — ABNORMAL HIGH (ref 70–99)
Glucose-Capillary: 144 mg/dL — ABNORMAL HIGH (ref 70–99)

## 2024-04-08 NOTE — Plan of Care (Signed)
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
## 2024-04-08 NOTE — TOC Progression Note (Signed)
 Transition of Care [facility]) - Progression Note    Patient Details  Name: Rick [name] MRN: [id number] Date of Birth: [birth year]  [facility] CM/SW Contact  [name], RN Phone Number: 04/08/2024, 9:31 AM  Clinical Narrative:     Patient still pending placement by [facility] DSS                    Expected Discharge Plan and Services         Expected Discharge Date: 04/03/24                                     Social Drivers of Health (SDOH) Interventions SDOH Screenings   Food Insecurity: Patient Declined (03/21/2024)  Housing: Patient Declined (03/21/2024)  Transportation Needs: Patient Declined (03/21/2024)  Utilities: Patient Declined (03/21/2024)  Depression (PHQ2-9): Low Risk  (10/26/2021)  Financial Resource Strain: High Risk (07/17/2022)  Social Connections: Patient Declined (03/21/2024)  Tobacco Use: Medium Risk (03/20/2024)    Readmission Risk Interventions     No data to display

## 2024-04-09 DIAGNOSIS — G9341 Metabolic encephalopathy: Secondary | ICD-10-CM | POA: Diagnosis not present

## 2024-04-09 LAB — CBC
HCT: 40.7 % (ref 39.0–52.0)
Hemoglobin: 13.7 g/dL (ref 13.0–17.0)
MCH: 28.7 pg (ref 26.0–34.0)
MCHC: 33.7 g/dL (ref 30.0–36.0)
MCV: 85.1 fL (ref 80.0–100.0)
Platelets: 261 K/uL (ref 150–400)
RBC: 4.78 MIL/uL (ref 4.22–5.81)
RDW: 14.3 % (ref 11.5–15.5)
WBC: 8.9 K/uL (ref 4.0–10.5)
nRBC: 0 % (ref 0.0–0.2)

## 2024-04-09 LAB — GLUCOSE, CAPILLARY
Glucose-Capillary: 144 mg/dL — ABNORMAL HIGH (ref 70–99)
Glucose-Capillary: 132 mg/dL — ABNORMAL HIGH (ref 70–99)
Glucose-Capillary: 184 mg/dL — ABNORMAL HIGH (ref 70–99)
Glucose-Capillary: 125 mg/dL — ABNORMAL HIGH (ref 70–99)

## 2024-04-09 NOTE — Progress Notes (Signed)
 Patient assisted to stand at the bedside, stepped in place and walked to the head of the bed x 2 staff extensive assist.  Rick [name] V Khanh Cordner

## 2024-04-09 NOTE — Plan of Care (Signed)
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
## 2024-04-09 NOTE — Progress Notes (Signed)
 Patient assisted to chair with extensive assistance of two staff and a rolling walker patient tolerated 15 minutes with direct observation and chair alarm, still slid to the bottom of the chair. Assisted back to the bed with extensive assistance of two staff to stand, pivot, transfer back to the bed. Fresh linens applied.   .me

## 2024-04-10 DIAGNOSIS — G9341 Metabolic encephalopathy: Secondary | ICD-10-CM | POA: Diagnosis not present

## 2024-04-10 LAB — GLUCOSE, CAPILLARY
Glucose-Capillary: 126 mg/dL — ABNORMAL HIGH (ref 70–99)
Glucose-Capillary: 99 mg/dL (ref 70–99)
Glucose-Capillary: 80 mg/dL (ref 70–99)
Glucose-Capillary: 284 mg/dL — ABNORMAL HIGH (ref 70–99)

## 2024-04-10 NOTE — Progress Notes (Signed)
 The patient's bed alarm went off and the patient started yelling for help. Staff immediately assisted the patient who was found on the ground. He reports getting too close to the edge of the bed and falling off. He denied hitting his bed, he was conversing with staff and laughing with no s/s of pain, he also verbally denied having any new pain, and his vital signs were all WDL. He was assisted back to the bed and the provider was notified. No new orders at this time. The bed alarm setting has been changed to a more sensitive zone, all side rails are up, and a tele sitter will be placed in the room per provider orders.

## 2024-04-10 NOTE — Progress Notes (Signed)
 Mobility Specialist - Progress Note   04/10/24 0912  Mobility  Activity Dangled on edge of bed;Stood at bedside;Pivoted/transferred from chair to bed;Pivoted/transferred from bed to chair  Level of Assistance Contact guard assist, steadying assist  Assistive Device Front wheel walker  Distance Ambulated (ft) 4 ft  Activity Response Tolerated well  Mobility visit 1 Mobility  Mobility Specialist Start Time (ACUTE ONLY) [id number]  Mobility Specialist Stop Time (ACUTE ONLY) [id number]  Mobility Specialist Time Calculation (min) (ACUTE ONLY) 15 min   Pt supine upon entry, utilizing RA-- soiled linen. Pt completed bed mob MinA, STS from EOB and transferred to the recliner via SPT MinA-CGA-- no AD. MS completed linen and gown change, Pt returned to bed via SPT CGA w/ RW. Pt left supine with alarm set and needs within reach.  America Silvan Mobility Specialist 04/10/24 9:15 AM

## 2024-04-10 NOTE — Plan of Care (Signed)
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
## 2024-04-11 DIAGNOSIS — G9341 Metabolic encephalopathy: Secondary | ICD-10-CM | POA: Diagnosis not present

## 2024-04-11 LAB — GLUCOSE, CAPILLARY
Glucose-Capillary: 133 mg/dL — ABNORMAL HIGH (ref 70–99)
Glucose-Capillary: 224 mg/dL — ABNORMAL HIGH (ref 70–99)
Glucose-Capillary: 106 mg/dL — ABNORMAL HIGH (ref 70–99)

## 2024-04-11 NOTE — TOC Progression Note (Signed)
 Transition of Care [facility]) - Progression Note    Patient Details  Name: Rick [name] MRN: [id number] Date of Birth: 08/08/1941  Transition of [facility]) CM/SW Contact  [name], RN Phone Number: 04/11/2024, 2:19 PM  Clinical Narrative:     Secure email sent to Kailee and Anyia with Dss to determine if there is an update on placement                     Expected Discharge Plan and Services         Expected Discharge Date: 04/03/24                                     Social Drivers of Health (SDOH) Interventions SDOH Screenings   Food Insecurity: Patient Declined (03/21/2024)  Housing: Patient Declined (03/21/2024)  Transportation Needs: Patient Declined (03/21/2024)  Utilities: Patient Declined (03/21/2024)  Depression (PHQ2-9): Low Risk  (10/26/2021)  Financial Resource Strain: High Risk (07/17/2022)  Social Connections: Patient Declined (03/21/2024)  Tobacco Use: Medium Risk (03/20/2024)    Readmission Risk Interventions     No data to display

## 2024-04-11 NOTE — Progress Notes (Signed)
 Mobility Specialist - Progress Note   04/11/24 1037  Mobility  Activity Ambulated with assistance;Pivoted/transferred to/from BSC;Dangled on edge of bed;Stood at bedside  Level of Assistance Minimal assist, patient does 75% or more  Assistive Device Front wheel walker  Distance Ambulated (ft) 8 ft  Activity Response Tolerated well  Mobility visit 1 Mobility  Mobility Specialist Start Time (ACUTE ONLY) 0947  Mobility Specialist Stop Time (ACUTE ONLY) 1016  Mobility Specialist Time Calculation (min) (ACUTE ONLY) 29 min   Pt side lying upon entry, utilizing RA. Pt completed bed mob with ModA to bring trunk from sup to sit. Pt dangled EOB for ~ 1 min, STS MinA and transfer to the [facility]-- unsteady. Pt left seated on the BSC while MS and NT completed linen change and peri care MaxA-- Blood in BM noted, reported to RN. Pt returned to the bed, left supine with alarm set and needs within reach.  America Silvan Mobility Specialist 04/11/24 10:54 AM
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
## 2024-04-12 DIAGNOSIS — G9341 Metabolic encephalopathy: Secondary | ICD-10-CM | POA: Diagnosis not present

## 2024-04-12 LAB — CBC
HCT: 42.7 % (ref 39.0–52.0)
Hemoglobin: 14.1 g/dL (ref 13.0–17.0)
MCH: 28.3 pg (ref 26.0–34.0)
MCHC: 33 g/dL (ref 30.0–36.0)
MCV: 85.6 fL (ref 80.0–100.0)
Platelets: 256 K/uL (ref 150–400)
RBC: 4.99 MIL/uL (ref 4.22–5.81)
RDW: 14.4 % (ref 11.5–15.5)
WBC: 9.4 K/uL (ref 4.0–10.5)
nRBC: 0 % (ref 0.0–0.2)

## 2024-04-12 LAB — GLUCOSE, CAPILLARY
Glucose-Capillary: 123 mg/dL — ABNORMAL HIGH (ref 70–99)
Glucose-Capillary: 237 mg/dL — ABNORMAL HIGH (ref 70–99)
Glucose-Capillary: 75 mg/dL (ref 70–99)
Glucose-Capillary: 191 mg/dL — ABNORMAL HIGH (ref 70–99)
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
## 2024-04-12 NOTE — Progress Notes (Signed)
 Mobility Specialist - Progress Note   04/12/24 0847  Mobility  Activity Ambulated with assistance  Level of Assistance Minimal assist, patient does 75% or more  Assistive Device Front wheel walker  Distance Ambulated (ft) 160 ft  Activity Response Tolerated well  Mobility visit 1 Mobility  Mobility Specialist Start Time (ACUTE ONLY) 0830  Mobility Specialist Stop Time (ACUTE ONLY) 0844  Mobility Specialist Time Calculation (min) (ACUTE ONLY) 14 min   Pt supine upon entry, utilizing RA. Pt motivated and agreeable to OOB amb this date. Pt required MaxA to don socks. Pt completed bed mob ModI, STS to RW and amb one lap around the NS MinA-- VC's for proper body positioning within the RW. Upon return to the room Pt experiences mild LOB, MaxA for correction--- parkinson gait at baseline. Pt return to bed, left semi fowler with alarm set and needs within reach.  America Silvan Mobility Specialist 04/12/24 8:53 AM

## 2024-04-12 NOTE — Plan of Care (Signed)
 Problem: Education: Goal: Knowledge of General Education information will improve Description: Including pain rating scale, medication(s)/side effects and non-pharmacologic comfort measures 04/12/2024 1828 by Alveria Reader, Kathleen SAUNDERS, RN Outcome: Progressing 04/12/2024 1827 by Alveria Reader, Lott Seelbach SAUNDERS, RN Outcome: Progressing   Problem: Health Behavior/Discharge Planning: Goal: Ability to manage health-related needs will improve 04/12/2024 1828 by Alveria Reader, Kathleen SAUNDERS, RN Outcome: Progressing 04/12/2024 1827 by Alveria Reader, Purcell Jungbluth R, RN Outcome: Progressing   Problem: Clinical Measurements: Goal: Ability to maintain clinical measurements within normal limits will improve 04/12/2024 1828 by Alveria Reader, Kathleen SAUNDERS, RN Outcome: Progressing 04/12/2024 1827 by Alveria Reader, Stacyann Mcconaughy R, RN Outcome: Progressing Goal: Will remain free from infection 04/12/2024 1828 by Alveria Reader Kathleen SAUNDERS, RN Outcome: Progressing 04/12/2024 1827 by Alveria Reader, Jesusmanuel Erbes SAUNDERS, RN Outcome: Progressing Goal: Diagnostic test results will improve 04/12/2024 1828 by Alveria Reader, Kathleen SAUNDERS, RN Outcome: Progressing 04/12/2024 1827 by Alveria Reader, Vivian Neuwirth SAUNDERS, RN Outcome: Progressing Goal: Respiratory complications will improve 04/12/2024 1828 by Alveria Reader, Kathleen SAUNDERS, RN Outcome: Progressing 04/12/2024 1827 by Alveria Reader, Emelio Schneller SAUNDERS, RN Outcome: Progressing Goal: Cardiovascular complication will be avoided 04/12/2024 1828 by Alveria Reader, Kathleen SAUNDERS, RN Outcome: Progressing 04/12/2024 1827 by Alveria Reader Kathleen SAUNDERS, RN Outcome: Progressing   Problem: Activity: Goal: Risk for activity intolerance will decrease 04/12/2024 1828 by Alveria Reader, Kathleen SAUNDERS, RN Outcome: Progressing 04/12/2024 1827 by Alveria Reader, Oneil Behney R, RN Outcome: Progressing   Problem: Nutrition: Goal: Adequate nutrition will be maintained 04/12/2024 1828 by Alveria Reader, Kathleen SAUNDERS, RN Outcome: Progressing 04/12/2024 1827 by Alveria Reader, Falicity Sheets R, RN Outcome: Progressing   Problem: Coping: Goal: Level of anxiety will decrease 04/12/2024 1828 by Alveria Reader, Kathleen SAUNDERS, RN Outcome: Progressing 04/12/2024 1827 by Alveria Reader, Markevion Lattin R, RN Outcome: Progressing   Problem: Elimination: Goal: Will not experience complications related to bowel motility 04/12/2024 1828 by Alveria Reader, Kathleen SAUNDERS, RN Outcome: Progressing 04/12/2024 1827 by Alveria Reader, Adonus Uselman SAUNDERS, RN Outcome: Progressing Goal: Will not experience complications related to urinary retention 04/12/2024 1828 by Alveria Reader, Kathleen SAUNDERS, RN Outcome: Progressing 04/12/2024 1827 by Alveria Reader, Ulisses Vondrak R, RN Outcome: Progressing   Problem: Pain Managment: Goal: General experience of comfort will improve and/or be controlled 04/12/2024 1828 by Alveria Reader, Kathleen SAUNDERS, RN Outcome: Progressing 04/12/2024 1827 by Alveria Reader, Chrishana Spargur R, RN Outcome: Progressing   Problem: Safety: Goal: Ability to remain free from injury will improve 04/12/2024 1828 by Alveria Reader, Kathleen SAUNDERS, RN Outcome: Progressing 04/12/2024 1827 by Alveria Reader, Tramaine Snell R, RN Outcome: Progressing   Problem: Skin Integrity: Goal: Risk for impaired skin integrity will decrease 04/12/2024 1828 by Alveria Reader, Kathleen SAUNDERS, RN Outcome: Progressing 04/12/2024 1827 by Alveria Reader, Murielle Stang R, RN Outcome: Progressing   Problem: Education: Goal: Ability to describe self-care measures that may prevent or decrease complications (Diabetes Survival Skills Education) will improve 04/12/2024 1828 by Alveria Reader Kathleen SAUNDERS, RN Outcome: Progressing 04/12/2024 1827 by Alveria Reader, Kathleen SAUNDERS, RN Outcome: Progressing Goal: Individualized Educational Video(s) 04/12/2024 1828 by Alveria Reader, Kathleen SAUNDERS, RN Outcome: Progressing 04/12/2024 1827 by Alveria Reader, Reshawn Ostlund R, RN Outcome: Progressing   Problem: Coping: Goal: Ability to adjust to condition or change in health will improve 04/12/2024 1828 by  Alveria Reader, Kathleen SAUNDERS, RN Outcome: Progressing 04/12/2024 1827 by Alveria Reader, Braven Wolk R, RN Outcome: Progressing   Problem: Fluid Volume: Goal: Ability to maintain a balanced intake and output will improve 04/12/2024 1828 by Alveria Reader, Kathleen SAUNDERS, RN Outcome: Progressing 04/12/2024 1827  by Alveria Reader, Kathleen SAUNDERS, RN Outcome: Progressing   Problem: Health Behavior/Discharge Planning: Goal: Ability to identify and utilize available resources and services will improve 04/12/2024 1828 by Alveria Reader, Kathleen SAUNDERS, RN Outcome: Progressing 04/12/2024 1827 by Alveria Reader, Leyan Branden SAUNDERS, RN Outcome: Progressing Goal: Ability to manage health-related needs will improve 04/12/2024 1828 by Alveria Reader, Kathleen SAUNDERS, RN Outcome: Progressing 04/12/2024 1827 by Alveria Reader, Ramata Strothman SAUNDERS, RN Outcome: Progressing   Problem: Metabolic: Goal: Ability to maintain appropriate glucose levels will improve 04/12/2024 1828 by Alveria Reader, Kathleen SAUNDERS, RN Outcome: Progressing 04/12/2024 1827 by Alveria Reader, Daurice Ovando R, RN Outcome: Progressing   Problem: Nutritional: Goal: Maintenance of adequate nutrition will improve 04/12/2024 1828 by Alveria Reader, Kathleen SAUNDERS, RN Outcome: Progressing 04/12/2024 1827 by Alveria Reader, Darriel Sinquefield SAUNDERS, RN Outcome: Progressing Goal: Progress toward achieving an optimal weight will improve 04/12/2024 1828 by Alveria Reader, Kathleen SAUNDERS, RN Outcome: Progressing 04/12/2024 1827 by Alveria Reader, Arcangel Minion R, RN Outcome: Progressing   Problem: Skin Integrity: Goal: Risk for impaired skin integrity will decrease 04/12/2024 1828 by Alveria Reader, Kathleen SAUNDERS, RN Outcome: Progressing 04/12/2024 1827 by Alveria Reader, Dennie Vecchio SAUNDERS, RN Outcome: Progressing   Problem: Tissue Perfusion: Goal: Adequacy of tissue perfusion will improve 04/12/2024 1828 by Alveria Reader, Kathleen SAUNDERS, RN Outcome: Progressing 04/12/2024 1827 by Alveria Reader Kathleen SAUNDERS, RN Outcome: Progressing

## 2024-04-13 DIAGNOSIS — G9341 Metabolic encephalopathy: Secondary | ICD-10-CM | POA: Diagnosis not present

## 2024-04-13 LAB — GLUCOSE, CAPILLARY
Glucose-Capillary: 130 mg/dL — ABNORMAL HIGH (ref 70–99)
Glucose-Capillary: 148 mg/dL — ABNORMAL HIGH (ref 70–99)
Glucose-Capillary: 97 mg/dL (ref 70–99)
Glucose-Capillary: 136 mg/dL — ABNORMAL HIGH (ref 70–99)
Glucose-Capillary: 160 mg/dL — ABNORMAL HIGH (ref 70–99)

## 2024-04-13 NOTE — Plan of Care (Signed)
  Problem: Education: Goal: Knowledge of General Education information will improve Description: Including pain rating scale, medication(s)/side effects and non-pharmacologic comfort measures Outcome: Progressing   Problem: Clinical Measurements: Goal: Will remain free from infection Outcome: Progressing   Problem: Nutrition: Goal: Adequate nutrition will be maintained Outcome: Progressing   Problem: Coping: Goal: Level of anxiety will decrease Outcome: Progressing   Problem: Skin Integrity: Goal: Risk for impaired skin integrity will decrease Outcome: Progressing   Problem: Health Behavior/Discharge Planning: Goal: Ability to manage health-related needs will improve Outcome: Progressing
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
## 2024-04-14 DIAGNOSIS — G9341 Metabolic encephalopathy: Secondary | ICD-10-CM | POA: Diagnosis not present

## 2024-04-14 LAB — GLUCOSE, CAPILLARY
Glucose-Capillary: 155 mg/dL — ABNORMAL HIGH (ref 70–99)
Glucose-Capillary: 123 mg/dL — ABNORMAL HIGH (ref 70–99)
Glucose-Capillary: 261 mg/dL — ABNORMAL HIGH (ref 70–99)
Glucose-Capillary: 202 mg/dL — ABNORMAL HIGH (ref 70–99)

## 2024-04-14 NOTE — Progress Notes (Addendum)
 Patient's wallet+$300 locked in with security. Paper copy and key placed inside patient's chart.
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
## 2024-04-14 NOTE — ED Notes (Signed)
 This RN and Dene Caldron, Pat Advocate counted cash in patient's wallet. $300 counted by this RN. Dene Caldron will bring wallet with cash to patient.

## 2024-04-15 DIAGNOSIS — G9341 Metabolic encephalopathy: Secondary | ICD-10-CM | POA: Diagnosis not present

## 2024-04-15 LAB — CBC
HCT: 38.5 % — ABNORMAL LOW (ref 39.0–52.0)
Hemoglobin: 12.9 g/dL — ABNORMAL LOW (ref 13.0–17.0)
MCH: 28.5 pg (ref 26.0–34.0)
MCHC: 33.5 g/dL (ref 30.0–36.0)
MCV: 85 fL (ref 80.0–100.0)
Platelets: 263 K/uL (ref 150–400)
RBC: 4.53 MIL/uL (ref 4.22–5.81)
RDW: 14.5 % (ref 11.5–15.5)
WBC: 9.8 K/uL (ref 4.0–10.5)
nRBC: 0 % (ref 0.0–0.2)

## 2024-04-15 LAB — GLUCOSE, CAPILLARY: Glucose-Capillary: 109 mg/dL — ABNORMAL HIGH (ref 70–99)

## 2024-04-15 NOTE — TOC Progression Note (Signed)
 Transition of Care [facility]) - Progression Note    Patient Details  Name: Rick [name] MRN: [id number] Date of Birth: 20-Aug-1940  Transition of Care [facility]) CM/SW Contact  [name], RN Phone Number: 04/15/2024, 3:44 PM  Clinical Narrative:    Secure email send to Kailee and Anyia from DSS as well (cc [facility] IP Care Management supervisor) to request status update on placement                      Expected Discharge Plan and Services         Expected Discharge Date: 04/03/24                                     Social Drivers of Health (SDOH) Interventions SDOH Screenings   Food Insecurity: Patient Declined (03/21/2024)  Housing: Patient Declined (03/21/2024)  Transportation Needs: Patient Declined (03/21/2024)  Utilities: Patient Declined (03/21/2024)  Depression (PHQ2-9): Low Risk  (10/26/2021)  Financial Resource Strain: High Risk (07/17/2022)  Social Connections: Patient Declined (03/21/2024)  Tobacco Use: Medium Risk (03/20/2024)    Readmission Risk Interventions     No data to display

## 2024-04-15 NOTE — Plan of Care (Signed)

## 2024-04-15 NOTE — TOC Progression Note (Signed)
 Transition of Care [facility]) - Progression Note    Patient Details  Name: Rick [name] MRN: [id number] Date of Birth: [birth year]  Transition of Care [facility]) CM/SW Contact  [name], RN Phone Number: 04/15/2024, 10:02 AM  Clinical Narrative:          Patient still pending placement by [facility] DSS                Expected Discharge Plan and Services         Expected Discharge Date: 04/03/24                                     Social Drivers of Health (SDOH) Interventions SDOH Screenings   Food Insecurity: Patient Declined (03/21/2024)  Housing: Patient Declined (03/21/2024)  Transportation Needs: Patient Declined (03/21/2024)  Utilities: Patient Declined (03/21/2024)  Depression (PHQ2-9): Low Risk  (10/26/2021)  Financial Resource Strain: High Risk (07/17/2022)  Social Connections: Patient Declined (03/21/2024)  Tobacco Use: Medium Risk (03/20/2024)    Readmission Risk Interventions     No data to display

## 2024-04-15 NOTE — Inpatient Diabetes Management (Signed)
 Inpatient Diabetes Program Recommendations  AACE/ADA: New Consensus Statement on Inpatient Glycemic Control   Target Ranges:  Prepandial:   less than 140 mg/dL      Peak postprandial:   less than 180 mg/dL (1-2 hours)      Critically ill patients:  140 - 180 mg/dL    Latest Reference Range & Units 04/13/24 17:32 04/13/24 21:18 04/14/24 07:20 04/14/24 11:40 04/14/24 16:36 04/15/24 07:31  Glucose-Capillary 70 - 99 mg/dL 863 (H) 839 (H) 876 (H) 261 (H) 202 (H) 109 (H)   Review of Glycemic Control  Diabetes history: DM2 Outpatient Diabetes medications: Glipizide  2.5mg  every 24hrs  Current orders for Inpatient glycemic control: Glipizide  5mg  every 24hrs  Inpatient Diabetes Program Recommendations: May want to consider ordering Novolog  correction given elevated Post Prandial CBGs.   Thanks,  Earnie Gainer, RN, MSN, CDCES Diabetes Coordinator Inpatient Diabetes Program [phone] (Team Pager from 8am to 5pm)

## 2024-04-15 NOTE — Progress Notes (Signed)
 Mobility Specialist - Progress Note   04/15/24 0932  Mobility  Activity Ambulated with assistance  Level of Assistance Minimal assist, patient does 75% or more  Assistive Device Front wheel walker  Distance Ambulated (ft) 40 ft  Activity Response Tolerated well  Mobility visit 1 Mobility  Mobility Specialist Start Time (ACUTE ONLY) 0909  Mobility Specialist Stop Time (ACUTE ONLY) [id number]  Mobility Specialist Time Calculation (min) (ACUTE ONLY) 14 min   Pt supine upon entry, utilizing RA. Pt motivated and agreeable to OOB amb this date. Pt required MaxA to don socks, completes bed mob with HHA to bring trunk from sup to sit. Pt STS to RW Mod-MinA-- multimodal cueing for proper hand placement on the RW. Pt amb and self selected 40 ft within the hallway MinA-CGA, tolerated well. Pt returned to the room, left supine with alarm set and needs within reach.  America Silvan Mobility Specialist 04/15/24 9:36 AM

## 2024-04-15 NOTE — TOC Progression Note (Signed)
 Transition of Care [facility]) - Progression Note    Patient Details  Name: Rick [name] MRN: [id number] Date of Birth: [birth year]  Transition of Care [facility]) CM/SW Contact  [name], RN Phone Number: 04/15/2024, 4:39 PM  Clinical Narrative:        Per Terrilyn with DSS [place] house will reassess today or tomorrow,  And referral to sent to [email or web address] per Anyia Request                  Expected Discharge Plan and Services         Expected Discharge Date: 04/03/24                                     Social Drivers of Health (SDOH) Interventions SDOH Screenings   Food Insecurity: Patient Declined (03/21/2024)  Housing: Patient Declined (03/21/2024)  Transportation Needs: Patient Declined (03/21/2024)  Utilities: Patient Declined (03/21/2024)  Depression (PHQ2-9): Low Risk  (10/26/2021)  Financial Resource Strain: High Risk (07/17/2022)  Social Connections: Patient Declined (03/21/2024)  Tobacco Use: Medium Risk (03/20/2024)    Readmission Risk Interventions     No data to display
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
## 2024-04-16 DIAGNOSIS — D519 Vitamin B12 deficiency anemia, unspecified: Secondary | ICD-10-CM | POA: Insufficient documentation

## 2024-04-16 DIAGNOSIS — G20A1 Parkinson's disease without dyskinesia, without mention of fluctuations: Secondary | ICD-10-CM | POA: Diagnosis not present

## 2024-04-16 DIAGNOSIS — I480 Paroxysmal atrial fibrillation: Secondary | ICD-10-CM | POA: Diagnosis not present

## 2024-04-16 DIAGNOSIS — G9341 Metabolic encephalopathy: Secondary | ICD-10-CM | POA: Diagnosis not present

## 2024-04-16 MED ORDER — SENNOSIDES-DOCUSATE SODIUM 8.6-50 MG PO TABS
2.0000 | ORAL_TABLET | Freq: Two times a day (BID) | ORAL | Status: DC
  Administered 2024-04-16 – 2024-04-26 (×19): 2 via ORAL
  Filled 2024-04-16 (×20): qty 2

## 2024-04-16 MED ORDER — VITAMIN B-12 1000 MCG PO TABS
1000.0000 ug | ORAL_TABLET | Freq: Every day | ORAL | Status: DC
  Administered 2024-04-16 – 2024-05-03 (×18): 1000 ug via ORAL
  Filled 2024-04-16 (×19): qty 1

## 2024-04-16 NOTE — Plan of Care (Signed)
  Problem: Education: Goal: Knowledge of General Education information will improve Description: Including pain rating scale, medication(s)/side effects and non-pharmacologic comfort measures Outcome: Progressing   Problem: Health Behavior/Discharge Planning: Goal: Ability to manage health-related needs will improve Outcome: Progressing   Problem: Clinical Measurements: Goal: Ability to maintain clinical measurements within normal limits will improve Outcome: Progressing Goal: Will remain free from infection Outcome: Progressing Goal: Diagnostic test results will improve Outcome: Progressing Goal: Respiratory complications will improve Outcome: Progressing Goal: Cardiovascular complication will be avoided Outcome: Progressing   Problem: Activity: Goal: Risk for activity intolerance will decrease Outcome: Progressing   Problem: Nutrition: Goal: Adequate nutrition will be maintained Outcome: Progressing   Problem: Coping: Goal: Level of anxiety will decrease Outcome: Progressing   Problem: Elimination: Goal: Will not experience complications related to bowel motility Outcome: Progressing Goal: Will not experience complications related to urinary retention Outcome: Progressing   Problem: Safety: Goal: Ability to remain free from injury will improve Outcome: Progressing   Problem: Pain Managment: Goal: General experience of comfort will improve and/or be controlled Outcome: Progressing   Problem: Skin Integrity: Goal: Risk for impaired skin integrity will decrease Outcome: Progressing   Problem: Metabolic: Goal: Ability to maintain appropriate glucose levels will improve Outcome: Progressing   Problem: Skin Integrity: Goal: Risk for impaired skin integrity will decrease Outcome: Progressing   Problem: Tissue Perfusion: Goal: Adequacy of tissue perfusion will improve Outcome: Progressing

## 2024-04-16 NOTE — Plan of Care (Signed)
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
## 2024-04-16 NOTE — Progress Notes (Signed)
 PT Cancellation Note  Patient Details Name: Rick [name] MRN: [id number] DOB: October 31, 1940   Cancelled Treatment:    Reason Eval/Treat Not Completed: Other (comment) Pt was laying in bed eager to speak with the PT, but when offered to do some exercises/activity he reports he had already walked this morning and Why would I want to go again.  Encouraged further physical activity but he continues to decline.  Verified with nursing that he had walked.  Will maintain on caseload and continue to see as appropriate.  Carmin JONELLE Deed, DPT 04/16/2024, 2:44 PM

## 2024-04-16 NOTE — Progress Notes (Signed)
 Mobility Specialist - Progress Note   04/16/24 1004  Mobility  Activity Ambulated with assistance  Level of Assistance Minimal assist, patient does 75% or more  Assistive Device Front wheel walker  Distance Ambulated (ft) 60 ft  Activity Response Tolerated well  Mobility visit 1 Mobility  Mobility Specialist Start Time (ACUTE ONLY) [id number]  Mobility Specialist Stop Time (ACUTE ONLY) 0959  Mobility Specialist Time Calculation (min) (ACUTE ONLY) 20 min   Pt amb one lap around the NS MinA, Mod cueing for sequencing this date. Pt returned to the room, left supine with alarm set and needs within reach.  America Silvan Mobility Specialist 04/16/24 10:07 AM
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
## 2024-04-16 NOTE — TOC Progression Note (Addendum)
 Transition of Care [facility]) - Progression Note    Patient Details  Name: Rick [name] MRN: [id number] Date of Birth: 1941/02/02  [facility] CM/SW Contact  Lauraine JAYSON Carpen, LCSW Phone Number: 04/16/2024, 11:47 AM  Clinical Narrative:   [facility] declined making a bed offer.  3:00 pm: Left voicemail for Rod at [facility] to see if staff had been here to reassess patient yet.  Expected Discharge Plan and Services         Expected Discharge Date: 04/03/24                                     Social Drivers of Health (SDOH) Interventions SDOH Screenings   Food Insecurity: Patient Declined (03/21/2024)  Housing: Patient Declined (03/21/2024)  Transportation Needs: Patient Declined (03/21/2024)  Utilities: Patient Declined (03/21/2024)  Depression (PHQ2-9): Low Risk  (10/26/2021)  Financial Resource Strain: High Risk (07/17/2022)  Social Connections: Patient Declined (03/21/2024)  Tobacco Use: Medium Risk (03/20/2024)    Readmission Risk Interventions     No data to display

## 2024-04-17 DIAGNOSIS — E1142 Type 2 diabetes mellitus with diabetic polyneuropathy: Secondary | ICD-10-CM | POA: Diagnosis not present

## 2024-04-17 DIAGNOSIS — G20A1 Parkinson's disease without dyskinesia, without mention of fluctuations: Secondary | ICD-10-CM | POA: Diagnosis not present

## 2024-04-17 DIAGNOSIS — G9341 Metabolic encephalopathy: Secondary | ICD-10-CM | POA: Diagnosis not present

## 2024-04-17 NOTE — TOC Progression Note (Addendum)
 Transition of Care [facility]) - Progression Note    Patient Details  Name: Rick [name] MRN: [id number] Date of Birth: September 23, 1940  Transition of Care [facility]) CM/SW Contact  Marinda Cooks, RN Phone Number: 04/17/2024, 10:48 AM  Clinical Narrative:    This CM attempted to reach POC at [facility] [employer] Control and instrumentation engineer ) regarding pt  being reassessed and was sent to VM . This CM left detailed message for return call. TOC will cont to follow dc planning / care coordination and update as applicable.   11:08- This CM received call back from niya Control and instrumentation engineer ) at [employer] informing that Admission liaison Sherlean Claudene will tentatively cone today or tomm to complete reassessment on pt. TOC will cont to follow dc planning / care coordination and update as applicable                   Expected Discharge Plan and Services         Expected Discharge Date: 04/03/24                                     Social Drivers of Health (SDOH) Interventions SDOH Screenings   Food Insecurity: Patient Declined (03/21/2024)  Housing: Patient Declined (03/21/2024)  Transportation Needs: Patient Declined (03/21/2024)  Utilities: Patient Declined (03/21/2024)  Depression (PHQ2-9): Low Risk  (10/26/2021)  Financial Resource Strain: High Risk (07/17/2022)  Social Connections: Patient Declined (03/21/2024)  Tobacco Use: Medium Risk (03/20/2024)    Readmission Risk Interventions     No data to display
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
## 2024-04-17 NOTE — Progress Notes (Addendum)
 Patient got to bedside mat on one knee, the other leg in bed, trying to get to his urinal. Bed alarm sounding. Tele monitor called. Three staff responded, Secondary school teacher, Economist. Patient assisted to get his leg back into bed and given urinal. Patient stated he got himself down there on one knee intentionally trying to reach his urinal.    Marcelene Weidemann V Javone Ybanez

## 2024-04-17 NOTE — Progress Notes (Signed)
 Mobility Specialist - Progress Note   04/17/24 1433  Mobility  Activity Ambulated with assistance  Level of Assistance Minimal assist, patient does 75% or more  Assistive Device Front wheel walker  Distance Ambulated (ft) 80 ft  Activity Response Tolerated well  Mobility visit 1 Mobility  Mobility Specialist Start Time (ACUTE ONLY) 1338  Mobility Specialist Stop Time (ACUTE ONLY) 1356  Mobility Specialist Time Calculation (min) (ACUTE ONLY) 18 min   Pt amb ~ 80 ft in the hallway MinA-- multimodal cueing for sequencing and RW navigation, little carryover. Pt returned to the room, left supine with alarm set and needs within reach.  America Silvan Mobility Specialist 04/17/24 2:36 PM

## 2024-04-18 DIAGNOSIS — G20A1 Parkinson's disease without dyskinesia, without mention of fluctuations: Secondary | ICD-10-CM | POA: Diagnosis not present

## 2024-04-18 DIAGNOSIS — E1142 Type 2 diabetes mellitus with diabetic polyneuropathy: Secondary | ICD-10-CM | POA: Diagnosis not present

## 2024-04-18 DIAGNOSIS — G9341 Metabolic encephalopathy: Secondary | ICD-10-CM | POA: Diagnosis not present

## 2024-04-18 LAB — CBC
HCT: 40 % (ref 39.0–52.0)
Hemoglobin: 13.3 g/dL (ref 13.0–17.0)
MCH: 28.2 pg (ref 26.0–34.0)
MCHC: 33.3 g/dL (ref 30.0–36.0)
MCV: 84.7 fL (ref 80.0–100.0)
Platelets: 241 K/uL (ref 150–400)
RBC: 4.72 MIL/uL (ref 4.22–5.81)
RDW: 14.3 % (ref 11.5–15.5)
WBC: 9.3 K/uL (ref 4.0–10.5)
nRBC: 0 % (ref 0.0–0.2)

## 2024-04-18 LAB — GLUCOSE, CAPILLARY: Glucose-Capillary: 139 mg/dL — ABNORMAL HIGH (ref 70–99)

## 2024-04-18 NOTE — Plan of Care (Signed)
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
## 2024-04-18 NOTE — TOC Progression Note (Signed)
 Transition of Care [facility]) - Progression Note    Patient Details  Name: Rick [name] MRN: [id number] Date of Birth: 05/15/1941  [facility] CM/SW Contact  [name], RN Phone Number: 04/18/2024, 9:41 AM  Clinical Narrative:        VM left for Sherlean Sharps executive director at [facility] to determine if assessment has been completed                  Expected Discharge Plan and Services         Expected Discharge Date: 04/03/24                                     Social Drivers of Health (SDOH) Interventions SDOH Screenings   Food Insecurity: Patient Declined (03/21/2024)  Housing: Patient Declined (03/21/2024)  Transportation Needs: Patient Declined (03/21/2024)  Utilities: Patient Declined (03/21/2024)  Depression (PHQ2-9): Low Risk  (10/26/2021)  Financial Resource Strain: High Risk (07/17/2022)  Social Connections: Patient Declined (03/21/2024)  Tobacco Use: Medium Risk (03/20/2024)    Readmission Risk Interventions     No data to display
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
## 2024-04-19 DIAGNOSIS — G9341 Metabolic encephalopathy: Secondary | ICD-10-CM | POA: Diagnosis not present

## 2024-04-19 DIAGNOSIS — E1142 Type 2 diabetes mellitus with diabetic polyneuropathy: Secondary | ICD-10-CM | POA: Diagnosis not present

## 2024-04-19 DIAGNOSIS — G20A1 Parkinson's disease without dyskinesia, without mention of fluctuations: Secondary | ICD-10-CM | POA: Diagnosis not present

## 2024-04-19 NOTE — Plan of Care (Signed)
  Problem: Clinical Measurements: Goal: Ability to maintain clinical measurements within normal limits will improve Outcome: Progressing Goal: Will remain free from infection Outcome: Progressing   Problem: Coping: Goal: Level of anxiety will decrease Outcome: Progressing   Problem: Elimination: Goal: Will not experience complications related to bowel motility Outcome: Progressing   Problem: Safety: Goal: Ability to remain free from injury will improve Outcome: Progressing   Problem: Skin Integrity: Goal: Risk for impaired skin integrity will decrease Outcome: Progressing
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
## 2024-04-20 DIAGNOSIS — G20A1 Parkinson's disease without dyskinesia, without mention of fluctuations: Secondary | ICD-10-CM | POA: Diagnosis not present

## 2024-04-20 DIAGNOSIS — G9341 Metabolic encephalopathy: Secondary | ICD-10-CM | POA: Diagnosis not present

## 2024-04-20 NOTE — Plan of Care (Incomplete)
  Problem: Education: Goal: Knowledge of General Education information will improve Description: Including pain rating scale, medication(s)/side effects and non-pharmacologic comfort measures Outcome: Progressing   Problem: Health Behavior/Discharge Planning: Goal: Ability to manage health-related needs will improve Outcome: Progressing   Problem: Clinical Measurements: Goal: Ability to maintain clinical measurements within normal limits will improve Outcome: Progressing Goal: Will remain free from infection Outcome: Progressing   Problem: Nutrition: Goal: Adequate nutrition will be maintained Outcome: Progressing   Problem: Coping: Goal: Level of anxiety will decrease Outcome: Progressing   Problem: Elimination: Goal: Will not experience complications related to bowel motility Outcome: Progressing
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
## 2024-04-21 DIAGNOSIS — G9341 Metabolic encephalopathy: Secondary | ICD-10-CM | POA: Diagnosis not present

## 2024-04-21 DIAGNOSIS — G20A1 Parkinson's disease without dyskinesia, without mention of fluctuations: Secondary | ICD-10-CM | POA: Diagnosis not present

## 2024-04-21 DIAGNOSIS — E1142 Type 2 diabetes mellitus with diabetic polyneuropathy: Secondary | ICD-10-CM | POA: Diagnosis not present

## 2024-04-21 LAB — CBC
HCT: 40.1 % (ref 39.0–52.0)
Hemoglobin: 13.3 g/dL (ref 13.0–17.0)
MCH: 28.5 pg (ref 26.0–34.0)
MCHC: 33.2 g/dL (ref 30.0–36.0)
MCV: 85.9 fL (ref 80.0–100.0)
Platelets: 262 K/uL (ref 150–400)
RBC: 4.67 MIL/uL (ref 4.22–5.81)
RDW: 14.2 % (ref 11.5–15.5)
WBC: 8.6 K/uL (ref 4.0–10.5)
nRBC: 0 % (ref 0.0–0.2)

## 2024-04-21 LAB — GLUCOSE, CAPILLARY
Glucose-Capillary: 108 mg/dL — ABNORMAL HIGH (ref 70–99)
Glucose-Capillary: 132 mg/dL — ABNORMAL HIGH (ref 70–99)

## 2024-04-21 NOTE — TOC Progression Note (Signed)
 Transition of Care [facility]) - Progression Note    Patient Details  Name: Rick [name] MRN: [id number] Date of Birth: 12/28/1940  Transition of Care [facility]) CM/SW Contact  [name], RN Phone Number: 04/21/2024, 3:17 PM  Clinical Narrative:     Per Isaiah at [facility] they are not able to offer a bed Secure email sent to Elease armin Aden with DSS to determine if there is an update on placement                     Expected Discharge Plan and Services         Expected Discharge Date: 04/03/24                                     Social Drivers of Health (SDOH) Interventions SDOH Screenings   Food Insecurity: Patient Declined (03/21/2024)  Housing: Patient Declined (03/21/2024)  Transportation Needs: Patient Declined (03/21/2024)  Utilities: Patient Declined (03/21/2024)  Depression (PHQ2-9): Low Risk  (10/26/2021)  Financial Resource Strain: High Risk (07/17/2022)  Social Connections: Patient Declined (03/21/2024)  Tobacco Use: Medium Risk (03/20/2024)    Readmission Risk Interventions     No data to display

## 2024-04-21 NOTE — Progress Notes (Signed)
 Mobility Specialist - Progress Note   04/21/24 1040  Mobility  Activity Ambulated with assistance;Stood at bedside  Level of Assistance Minimal assist, patient does 75% or more  Assistive Device Front wheel walker  Distance Ambulated (ft) 60 ft  Activity Response Tolerated well  Mobility visit 1 Mobility  Mobility Specialist Start Time (ACUTE ONLY) 1015  Mobility Specialist Stop Time (ACUTE ONLY) 1038  Mobility Specialist Time Calculation (min) (ACUTE ONLY) 23 min   Pt supine upon entry, utilizing RA. Pt motivated and agreeable to OOB amb this date. Pt amb ~60 ft in the hallway MinA, tolerated well. Pt returned to the room, utilized the [facility] prior to returning to bed. Pt left supine with alarm set and needs within reach.  America Silvan Mobility Specialist 04/21/24 10:45 AM

## 2024-04-21 NOTE — Plan of Care (Signed)

## 2024-04-21 NOTE — Plan of Care (Signed)
 Patient still impulsive getting out of bed and chair, unsafe.  Has been oriented to person place today and concerned about psychiatric evaluation today.  Worry about being found incompetent to take care of himself.  Ate well and vitals remained stable throughout day. Called family several times from room.
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
## 2024-04-22 DIAGNOSIS — G9341 Metabolic encephalopathy: Secondary | ICD-10-CM | POA: Diagnosis not present

## 2024-04-22 DIAGNOSIS — G20A1 Parkinson's disease without dyskinesia, without mention of fluctuations: Secondary | ICD-10-CM | POA: Diagnosis not present

## 2024-04-22 DIAGNOSIS — E1142 Type 2 diabetes mellitus with diabetic polyneuropathy: Secondary | ICD-10-CM | POA: Diagnosis not present

## 2024-04-22 MED ORDER — ARTIFICIAL TEARS OPHTHALMIC OINT
TOPICAL_OINTMENT | OPHTHALMIC | Status: DC | PRN
  Administered 2024-04-22: 1 via OPHTHALMIC
  Filled 2024-04-22 (×2): qty 1

## 2024-04-22 NOTE — Plan of Care (Signed)
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
## 2024-04-22 NOTE — Progress Notes (Signed)
 Mobility Specialist - Progress Note   04/22/24 1142  Mobility  Activity Ambulated with assistance  Level of Assistance Minimal assist, patient does 75% or more  Assistive Device Front wheel walker  Distance Ambulated (ft) 80 ft  Activity Response Tolerated well  Mobility visit 1 Mobility  Mobility Specialist Start Time (ACUTE ONLY) [id number]  Mobility Specialist Stop Time (ACUTE ONLY) 0954  Mobility Specialist Time Calculation (min) (ACUTE ONLY) 29 min   Pt supine upon entry, soiled. MS assisted NT with peri care, bath, linen and gown change. Pt amb ~80 ft in the hallway MinA, requiring constant multimodal cueing during duration of session. Pt left supine with alarm set and needs within reach.  America Silvan Mobility Specialist 04/22/24 11:45 AM

## 2024-04-23 DIAGNOSIS — G9341 Metabolic encephalopathy: Secondary | ICD-10-CM | POA: Diagnosis not present

## 2024-04-23 NOTE — Plan of Care (Signed)
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
## 2024-04-24 DIAGNOSIS — G9341 Metabolic encephalopathy: Secondary | ICD-10-CM | POA: Diagnosis not present

## 2024-04-24 NOTE — Progress Notes (Signed)
 PT Cancellation Note  Patient Details Name: ALBIE ARIZPE MRN: [id number] DOB: [birth year]   Cancelled Treatment:     Pt participated well with mobility specialist in am, however very agitated this pm due to difficulty calling sister. This therapist called and LM for sister to call. Unable to console pt further. Will re-attempt next available date/time per POC.    Darice JAYSON Bohr 04/24/2024, 3:40 PM

## 2024-04-24 NOTE — TOC Progression Note (Signed)
 Transition of Care [facility]) - Progression Note    Patient Details  Name: Rick [name] MRN: [id number] Date of Birth: 07-Jul-1941  Transition of [facility]) CM/SW Contact  [name], RN Phone Number: 04/24/2024, 8:56 AM  Clinical Narrative:      Secure email sent to Aniya and Kailee from Dss, Dr Lenon, and Delphine Bring [facility] Care Management  Good morning I am reaching out about Rick [name].  I have including Dr. Lenon who is his MD this week  When is Mr. Ingles protective order valid through?  Is there a court date set for guardianship?  Per Dr Lenon she feels that patient has capacity to make medical decision.  She has reached out to his friend Jerel and discussed disposition and support available should the patient return home.  She would like to know what the process is to reevaluate if he can make his own decisions, and plan for a home dc                   Expected Discharge Plan and Services         Expected Discharge Date: 04/03/24                                     Social Drivers of Health (SDOH) Interventions SDOH Screenings   Food Insecurity: Patient Declined (03/21/2024)  Housing: Patient Declined (03/21/2024)  Transportation Needs: Patient Declined (03/21/2024)  Utilities: Patient Declined (03/21/2024)  Depression (PHQ2-9): Low Risk  (10/26/2021)  Financial Resource Strain: High Risk (07/17/2022)  Social Connections: Patient Declined (03/21/2024)  Tobacco Use: Medium Risk (03/20/2024)    Readmission Risk Interventions     No data to display

## 2024-04-24 NOTE — Progress Notes (Signed)
 Mobility Specialist - Progress Note   04/24/24 1004  Mobility  Activity Ambulated with assistance;Pivoted/transferred to/from BSC;Stood at bedside  Level of Assistance Minimal assist, patient does 75% or more  Assistive Device Front wheel walker  Distance Ambulated (ft) 80 ft  Activity Response Tolerated well  Mobility visit 1 Mobility  Mobility Specialist Start Time (ACUTE ONLY) 0932  Mobility Specialist Stop Time (ACUTE ONLY) 1000  Mobility Specialist Time Calculation (min) (ACUTE ONLY) 28 min   Pt supine upon entry, utilizing RA. Pt completed bed mob MinA to bring trunk from sup to sit, transferred to the [facility] via SPT CGA. Pt required MaxA for peri care. Pt amb a self selected 41ft MinA with multimodal cueing for sequencing and proper hand placement  during amb-- ModA intermittently required during abrupt stops for balance. Pt returned to the room, stood at bedside and indep combed hair. Pt returned to bed, left supine with alarm set and needs within reach.  America Silvan Mobility Specialist 04/24/24 10:20 AM
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
## 2024-04-25 DIAGNOSIS — G9341 Metabolic encephalopathy: Secondary | ICD-10-CM | POA: Diagnosis not present
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
## 2024-04-25 NOTE — Plan of Care (Signed)
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
## 2024-04-25 NOTE — Plan of Care (Signed)
 Problem: Education: Goal: Knowledge of General Education information will improve Description: Including pain rating scale, medication(s)/side effects and non-pharmacologic comfort measures 04/25/2024 0427 by Baron Hands, RN Outcome: Progressing 04/25/2024 0427 by Baron Hands, RN Outcome: Progressing   Problem: Health Behavior/Discharge Planning: Goal: Ability to manage health-related needs will improve 04/25/2024 0427 by Baron Hands, RN Outcome: Progressing 04/25/2024 0427 by Baron Hands, RN Outcome: Progressing   Problem: Clinical Measurements: Goal: Ability to maintain clinical measurements within normal limits will improve 04/25/2024 0427 by Baron Hands, RN Outcome: Progressing 04/25/2024 0427 by Baron Hands, RN Outcome: Progressing Goal: Will remain free from infection 04/25/2024 0427 by Baron Hands, RN Outcome: Progressing 04/25/2024 0427 by Baron Hands, RN Outcome: Progressing Goal: Diagnostic test results will improve 04/25/2024 0427 by Baron Hands, RN Outcome: Progressing 04/25/2024 0427 by Baron Hands, RN Outcome: Progressing Goal: Respiratory complications will improve 04/25/2024 0427 by Baron Hands, RN Outcome: Progressing 04/25/2024 0427 by Baron Hands, RN Outcome: Progressing Goal: Cardiovascular complication will be avoided 04/25/2024 0427 by Baron Hands, RN Outcome: Progressing 04/25/2024 0427 by Baron Hands, RN Outcome: Progressing   Problem: Activity: Goal: Risk for activity intolerance will decrease 04/25/2024 0427 by Baron Hands, RN Outcome: Progressing 04/25/2024 0427 by Baron Hands, RN Outcome: Progressing   Problem: Nutrition: Goal: Adequate nutrition will be maintained 04/25/2024 0427 by Baron Hands, RN Outcome: Progressing 04/25/2024 0427 by Baron Hands, RN Outcome: Progressing   Problem: Coping: Goal: Level of anxiety will decrease 04/25/2024 0427 by Baron Hands, RN Outcome:  Progressing 04/25/2024 0427 by Baron Hands, RN Outcome: Progressing   Problem: Elimination: Goal: Will not experience complications related to bowel motility 04/25/2024 0427 by Baron Hands, RN Outcome: Progressing 04/25/2024 0427 by Baron Hands, RN Outcome: Progressing Goal: Will not experience complications related to urinary retention 04/25/2024 0427 by Baron Hands, RN Outcome: Progressing 04/25/2024 0427 by Baron Hands, RN Outcome: Progressing   Problem: Pain Managment: Goal: General experience of comfort will improve and/or be controlled 04/25/2024 0427 by Baron Hands, RN Outcome: Progressing 04/25/2024 0427 by Baron Hands, RN Outcome: Progressing   Problem: Safety: Goal: Ability to remain free from injury will improve 04/25/2024 0427 by Baron Hands, RN Outcome: Progressing 04/25/2024 0427 by Baron Hands, RN Outcome: Progressing   Problem: Skin Integrity: Goal: Risk for impaired skin integrity will decrease 04/25/2024 0427 by Baron Hands, RN Outcome: Progressing 04/25/2024 0427 by Baron Hands, RN Outcome: Progressing   Problem: Education: Goal: Ability to describe self-care measures that may prevent or decrease complications (Diabetes Survival Skills Education) will improve 04/25/2024 0427 by Baron Hands, RN Outcome: Progressing 04/25/2024 0427 by Baron Hands, RN Outcome: Progressing Goal: Individualized Educational Video(s) 04/25/2024 0427 by Baron Hands, RN Outcome: Progressing 04/25/2024 0427 by Baron Hands, RN Outcome: Progressing   Problem: Coping: Goal: Ability to adjust to condition or change in health will improve 04/25/2024 0427 by Baron Hands, RN Outcome: Progressing 04/25/2024 0427 by Baron Hands, RN Outcome: Progressing   Problem: Fluid Volume: Goal: Ability to maintain a balanced intake and output will improve 04/25/2024 0427 by Baron Hands, RN Outcome: Progressing 04/25/2024 0427 by  Baron Hands, RN Outcome: Progressing   Problem: Health Behavior/Discharge Planning: Goal: Ability to identify and utilize available resources and services will improve 04/25/2024 0427 by Baron Hands, RN Outcome: Progressing 04/25/2024 0427 by Baron Hands, RN Outcome: Progressing Goal: Ability to manage health-related needs will improve 04/25/2024 0427 by Baron Hands, RN Outcome: Progressing 04/25/2024 0427 by Baron Hands, RN Outcome: Progressing   Problem: Metabolic: Goal: Ability to maintain appropriate glucose  levels will improve 04/25/2024 0427 by Baron Hands, RN Outcome: Progressing 04/25/2024 0427 by Baron Hands, RN Outcome: Progressing   Problem: Nutritional: Goal: Maintenance of adequate nutrition will improve 04/25/2024 0427 by Baron Hands, RN Outcome: Progressing 04/25/2024 0427 by Baron Hands, RN Outcome: Progressing Goal: Progress toward achieving an optimal weight will improve 04/25/2024 0427 by Baron Hands, RN Outcome: Progressing 04/25/2024 0427 by Baron Hands, RN Outcome: Progressing   Problem: Skin Integrity: Goal: Risk for impaired skin integrity will decrease 04/25/2024 0427 by Baron Hands, RN Outcome: Progressing 04/25/2024 0427 by Baron Hands, RN Outcome: Progressing   Problem: Tissue Perfusion: Goal: Adequacy of tissue perfusion will improve 04/25/2024 0427 by Baron Hands, RN Outcome: Progressing 04/25/2024 0427 by Baron Hands, RN Outcome: Progressing
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
## 2024-04-26 DIAGNOSIS — G9341 Metabolic encephalopathy: Secondary | ICD-10-CM | POA: Diagnosis not present

## 2024-04-26 LAB — CBC
HCT: 39.5 % (ref 39.0–52.0)
Hemoglobin: 13.3 g/dL (ref 13.0–17.0)
MCH: 28.7 pg (ref 26.0–34.0)
MCHC: 33.7 g/dL (ref 30.0–36.0)
MCV: 85.1 fL (ref 80.0–100.0)
Platelets: 244 K/uL (ref 150–400)
RBC: 4.64 MIL/uL (ref 4.22–5.81)
RDW: 14.3 % (ref 11.5–15.5)
WBC: 8.5 K/uL (ref 4.0–10.5)
nRBC: 0 % (ref 0.0–0.2)

## 2024-04-26 LAB — BASIC METABOLIC PANEL WITH GFR
Anion gap: 10 (ref 5–15)
BUN: 23 mg/dL (ref 8–23)
CO2: 23 mmol/L (ref 22–32)
Calcium: 8.9 mg/dL (ref 8.9–10.3)
Chloride: 104 mmol/L (ref 98–111)
Creatinine, Ser: 1.13 mg/dL (ref 0.61–1.24)
GFR, Estimated: >60 mL/min (ref >60)
Glucose, Bld: 140 mg/dL — ABNORMAL HIGH (ref 70–99)
Potassium: 4.2 mmol/L (ref 3.5–5.1)
Sodium: 137 mmol/L (ref 135–145)

## 2024-04-26 NOTE — Plan of Care (Signed)
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
## 2024-04-27 DIAGNOSIS — G9341 Metabolic encephalopathy: Secondary | ICD-10-CM | POA: Diagnosis not present

## 2024-04-27 MED ORDER — POLYETHYLENE GLYCOL 3350 17 G PO PACK: 17.0000 g | PACK | Freq: Every day | ORAL | Status: DC | PRN

## 2024-04-27 MED ORDER — MAGNESIUM HYDROXIDE 400 MG/5ML PO SUSP: 30.0000 mL | Freq: Every day | ORAL | Status: DC | PRN

## 2024-04-27 MED ORDER — SENNA 8.6 MG PO TABS: 1.0000 | ORAL_TABLET | Freq: Every day | ORAL | Status: DC | PRN
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
## 2024-04-27 NOTE — Plan of Care (Signed)

## 2024-04-28 DIAGNOSIS — G9341 Metabolic encephalopathy: Secondary | ICD-10-CM | POA: Diagnosis not present

## 2024-04-28 LAB — GLUCOSE, CAPILLARY
Glucose-Capillary: 199 mg/dL — ABNORMAL HIGH (ref 70–99)
Glucose-Capillary: 136 mg/dL — ABNORMAL HIGH (ref 70–99)

## 2024-04-28 MED ORDER — TRAZODONE HCL 50 MG PO TABS
25.0000 mg | ORAL_TABLET | Freq: Every day | ORAL | Status: DC
  Administered 2024-04-28 – 2024-05-02 (×5): 25 mg via ORAL
  Filled 2024-04-28 (×5): qty 1

## 2024-04-28 NOTE — TOC Progression Note (Signed)
 Transition of Care [facility]) - Progression Note    Patient Details  Name: Rick [name] MRN: [id number] Date of Birth: 11-16-1940  [facility] CM/SW Contact  [name], RN Phone Number: 04/28/2024, 8:50 AM  Clinical Narrative:     At this time patient still remains pending placement per DSS under protective order                     Expected Discharge Plan and Services         Expected Discharge Date: 04/03/24                                     Social Drivers of Health (SDOH) Interventions SDOH Screenings   Food Insecurity: Patient Declined (03/21/2024)  Housing: Patient Declined (03/21/2024)  Transportation Needs: Patient Declined (03/21/2024)  Utilities: Patient Declined (03/21/2024)  Depression (PHQ2-9): Low Risk  (10/26/2021)  Financial Resource Strain: High Risk (07/17/2022)  Social Connections: Patient Declined (03/21/2024)  Tobacco Use: Medium Risk (03/20/2024)    Readmission Risk Interventions     No data to display
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
## 2024-04-28 NOTE — Plan of Care (Signed)

## 2024-04-29 DIAGNOSIS — G9341 Metabolic encephalopathy: Secondary | ICD-10-CM | POA: Diagnosis not present
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
## 2024-04-29 NOTE — NC FL2 (Signed)
 [place]  MEDICAID FL2 LEVEL OF CARE FORM     IDENTIFICATION  Patient Name: Rick [name] Birthdate: 1940/08/20 Sex: male Admission Date (Current Location): 03/20/2024  [place] and [place] Number:  Chiropodist and Address:  [facility], [address], [place], [place] [address]      Provider Number: [id number]  Attending Physician Name and Address:  Lenon Marien CROME, MD  Relative Name and Phone Number:  Cathryne Garret [phone]    Current Level of Care: Hospital Recommended Level of Care: Skilled Nursing Facility Prior Approval Number:    Date Approved/Denied:   PASRR Number: [id number]  Discharge Plan: SNF    Current Diagnoses: Patient Active Problem List   Diagnosis Date Noted   B12 deficiency anemia 04/16/2024   Acute metabolic encephalopathy 03/21/2024   Type 2 diabetes mellitus with peripheral neuropathy (HCC) 03/21/2024   Acute lower UTI 03/21/2024   Vitamin B12 deficiency 03/14/2024   Hyponatremia 03/14/2024   Acute delirium 03/12/2024   Parkinsonism (HCC) 03/12/2024   Paroxysmal atrial fibrillation (HCC) 03/12/2024   History of bipolar disorder 03/12/2024   AKI (acute kidney injury) (HCC) 03/12/2024   Tremor 05/28/2018   Urinary tract infection due to extended-spectrum beta lactamase (ESBL) producing Escherichia coli 11/29/2017   Osteoarthritis of multiple joints 08/23/2017   PVD (peripheral vascular disease) (HCC) 05/14/2017   Hard of hearing 12/07/2016   History of tobacco abuse 12/07/2016   History of ulcerative colitis 12/07/2016   Ventral hernia 12/21/2015   History of colon cancer, no staging 12/21/2015   Controlled type 2 diabetes mellitus without complication, without long-term current use of insulin  (HCC) 10/04/2015   History of bladder cancer 08/05/2015   Benign fibroma of prostate 08/05/2015   History of alcoholism (HCC) 08/05/2015   History of migraine headaches 08/05/2015   Calculus of kidney  08/05/2015   Essential hypertension 05/11/2015   Hyperlipidemia 05/11/2015   Affective bipolar disorder (HCC) 03/24/2015   Diabetic peripheral neuropathy associated with type 2 diabetes mellitus (HCC) 03/24/2015   Insomnia 03/24/2015   History of abdominal hernia 02/10/2015   Anxiety and depression 02/10/2015   Generalized anxiety disorder 05/18/2014   Chronic prostatitis 06/12/2013   Herpesviral infection of penis 11/07/2012   Incomplete bladder emptying 11/07/2012   Incisional hernia 11/07/2012   Balanoposthitis 10/31/2012   Benign localized prostatic hyperplasia with lower urinary tract symptoms (LUTS) 10/31/2012   ED (erectile dysfunction) of organic origin 10/31/2012   History of cancer of ureter 10/31/2012    Orientation RESPIRATION BLADDER Height & Weight     Self, Place  Normal Incontinent Weight: 79.4 kg Height:  5' 5 (165.1 cm)  BEHAVIORAL SYMPTOMS/MOOD NEUROLOGICAL BOWEL NUTRITION STATUS  Verbally abusive  (Parkinsons) Continent Diet (regular)  AMBULATORY STATUS COMMUNICATION OF NEEDS Skin   Limited Assist Verbally Bruising, Skin abrasions, Other (Comment) (pressure injury to left hip)                       Personal Care Assistance Level of Assistance  Bathing, Feeding, Dressing Bathing Assistance: Limited assistance Feeding assistance: Limited assistance Dressing Assistance: Limited assistance     Functional Limitations Info  Speech     Speech Info: Impaired    SPECIAL CARE FACTORS FREQUENCY  PT (By licensed PT), OT (By licensed OT)     PT Frequency: 3x OT Frequency: 3x            Contractures Contractures Info: Not present    Additional Factors Info  Code Status Code Status Info: Full Allergies Info: Blood-group Specific Substance, Haloperidol , Sulfa Antibiotics           Current Medications (04/29/2024):  This is the current hospital active medication list Current Facility-Administered Medications  Medication Dose Route Frequency  Provider Last Rate Last Admin   acetaminophen  (TYLENOL ) tablet 1,000 mg  1,000 mg Oral TID PRN Awanda City, MD   1,000 mg at 04/25/24 1809   albuterol  (PROVENTIL ) (2.5 MG/3ML) 0.083% nebulizer solution 2.5 mg  2.5 mg Nebulization Q6H PRN Dail Rankin RAMAN, RPH       amiodarone  (PACERONE ) tablet 200 mg  200 mg Oral Daily Ward, Kristen N, DO   200 mg at 04/29/24 0859   artificial tears (LACRILUBE) ophthalmic ointment   Both Eyes Q4H PRN Laurita Pillion, MD   1 Application at 04/22/24 1454   carbidopa -levodopa  (SINEMET  IR) 25-100 MG per tablet immediate release 1 tablet  1 tablet Oral TID Ward, Kristen N, DO   1 tablet at 04/29/24 0859   cyanocobalamin  (VITAMIN B12) tablet 1,000 mcg  1,000 mcg Oral Daily Laurita Pillion, MD   1,000 mcg at 04/29/24 9140   diclofenac  Sodium (VOLTAREN ) 1 % topical gel 4 g  4 g Topical QID Sreenath, Sudheer B, MD   4 g at 04/29/24 0900   divalproex  (DEPAKOTE  ER) 24 hr tablet 750 mg  750 mg Oral Daily Jadapalle, Sree, MD   750 mg at 04/29/24 0900   enoxaparin  (LOVENOX ) injection 40 mg  40 mg Subcutaneous Q24H Mansy, Jan A, MD   40 mg at 04/29/24 9141   glipiZIDE  (GLUCOTROL  XL) 24 hr tablet 5 mg  5 mg Oral Q breakfast Awanda City, MD   5 mg at 04/29/24 9140   magnesium  hydroxide (MILK OF MAGNESIA) suspension 30 mL  30 mL Oral Daily PRN Lenon Marien CROME, MD       ondansetron  (ZOFRAN ) tablet 4 mg  4 mg Oral Q6H PRN Mansy, Madison LABOR, MD       Oral care mouth rinse  15 mL Mouth Rinse PRN Sreenath, Sudheer B, MD       polyethylene glycol (MIRALAX  / GLYCOLAX ) packet 17 g  17 g Oral Daily PRN Anderson, Chelsey L, MD       QUEtiapine  (SEROQUEL ) tablet 50 mg  50 mg Oral TID Jadapalle, Sree, MD   50 mg at 04/29/24 0859   senna (SENOKOT) tablet 8.6 mg  1 tablet Oral Daily PRN Lenon Marien CROME, MD       traZODone  (DESYREL ) tablet 25 mg  25 mg Oral QHS Lenon Marien CROME, MD   25 mg at 04/28/24 2256     Discharge Medications: Please see discharge summary for a list of discharge  medications.  Relevant Imaging Results:  Relevant Lab Results:   Additional Information [id number]  [name], RN

## 2024-04-29 NOTE — TOC Progression Note (Addendum)
 Transition of Care [facility]) - Progression Note    Patient Details  Name: Rick [name] MRN: [id number] Date of Birth: 1941-01-25  [facility] CM/SW Contact  [name], RN Phone Number: 04/29/2024, 2:30 PM  Clinical Narrative:     Follow up email sent to Kailee and Anyia at DSS to determine if DSS has been able to follow up with friend Jerel Also notified them of recs for SNF, and bed offer from [employer]   Update:  Kaliee states to proceed with [facility] - Per MD patient medically appropriate for auth to be initiated - requested beverly with [facility] Care [facility]  - this CM inquired if dss will be updating patient and family - accepted in [facility] and notified Massie at [facility]    Update:  Per Jannis with DSS, and MD it is appropriate to notify patient.  Met with patient at bedside.  He states wooo OK , he inquires if we need his $20,000.  I let him know that we will attempt to get approval from his insurance.  He inquires where it is located.  I provided him the location, and he states he mother was at one in that area when she had cancer.                Expected Discharge Plan and Services         Expected Discharge Date: 04/03/24                                     Social Drivers of Health (SDOH) Interventions SDOH Screenings   Food Insecurity: Patient Declined (03/21/2024)  Housing: Patient Declined (03/21/2024)  Transportation Needs: Patient Declined (03/21/2024)  Utilities: Patient Declined (03/21/2024)  Depression (PHQ2-9): Low Risk  (10/26/2021)  Financial Resource Strain: High Risk (07/17/2022)  Social Connections: Patient Declined (03/21/2024)  Tobacco Use: Medium Risk (03/20/2024)    Readmission Risk Interventions     No data to display

## 2024-04-29 NOTE — TOC Progression Note (Signed)
 Transition of Care [facility]) - Progression Note    Patient Details  Name: Rick [name] MRN: [id number] Date of Birth: 28-Jul-1941  Transition of Care [facility]) CM/SW Contact  [name], RN Phone Number: 04/29/2024, 12:14 PM  Clinical Narrative:      Per IP Care Management director . Please work directly caseworker for alignment on discharge plan   Noted therapy recs for SNF.   Updated Fl2 completed Bed search initiated   Therapy notes secure emailed to Lennette Dade for review for her care homes                    Expected Discharge Plan and Services         Expected Discharge Date: 04/03/24                                     Social Drivers of Health (SDOH) Interventions SDOH Screenings   Food Insecurity: Patient Declined (03/21/2024)  Housing: Patient Declined (03/21/2024)  Transportation Needs: Patient Declined (03/21/2024)  Utilities: Patient Declined (03/21/2024)  Depression (PHQ2-9): Low Risk  (10/26/2021)  Financial Resource Strain: High Risk (07/17/2022)  Social Connections: Patient Declined (03/21/2024)  Tobacco Use: Medium Risk (03/20/2024)    Readmission Risk Interventions     No data to display

## 2024-04-29 NOTE — Plan of Care (Signed)

## 2024-04-30 DIAGNOSIS — G9341 Metabolic encephalopathy: Secondary | ICD-10-CM | POA: Diagnosis not present

## 2024-04-30 NOTE — Progress Notes (Signed)
 PROGRESS NOTE    Rick [name]  FMW:[id number] DOB: 03/01/1941 DOA: 03/20/2024 PCP: Sampson Ethridge LABOR, MD  Assessment & Plan:   Principal Problem:   Acute metabolic encephalopathy Active Problems:   Type 2 diabetes mellitus with peripheral neuropathy (HCC)   Acute lower UTI   Parkinsonism (HCC)   Paroxysmal atrial fibrillation (HCC)   B12 deficiency anemia  Assessment and Plan: Acute encephalopathy: likely delirium superimposed on dementia. Mental status is back to baseline. Continue on seroquel . Medically cleared and does not require inpatient hospitalization however he is under a DSS protective order and therefore cannot leave the hospital until safe dispo secured. Competency to be determined by the courts. TB skin test placed 8/9, read by MD 8/12.  No induration. No longer under IVC but protective order remains in place  DM2: fair control. Continue on home dose of glipizide . Glipizide  high risk for causing hypoglycemia    Parkinsonism: continue on home dose of sinemet     PAF: not on chronic anticoagulation likely secondary to high fall risk. Continue on home dose of amio   Hard of hearing: continue w/ supportive care   Possible hemorrhoids: continue on colace      DVT prophylaxis: lovenox   Code Status: full  Family Communication:  Disposition Plan: waiting on insurance auth to [employer] as per CM  Level of care: Telemetry Medical  Status is: Observation The patient remains OBS appropriate and will d/c before 2 midnights.    Consultants:    Procedures:   Antimicrobials:    Subjective: Pt c/o being in the hospital still.   Objective: Vitals:   04/29/24 1537 04/29/24 2038 04/30/24 0157 04/30/24 0807  BP: 106/77 103/73 111/71 120/80  Pulse: 65 67 71 90  Resp: 17 18 18 17   Temp: 97.7 F (36.5 C) 97.8 F (36.6 C) 97.7 F (36.5 C) 98.2 F (36.8 C)  TempSrc:      SpO2: 97% 96% 94% 94%  Weight:      Height:        Intake/Output  Summary (Last 24 hours) at 04/30/2024 1005 Last data filed at 04/29/2024 2350 Gross per 24 hour  Intake 720 ml  Output 800 ml  Net -80 ml   Filed Weights   03/20/24 1914  Weight: 79.4 kg    Examination:  General exam: Appears agitated  Respiratory system: Clear to auscultation. Respiratory effort normal. Cardiovascular system: S1 & S2 +. No rubs, gallops or clicks. Gastrointestinal system: Abdomen is nondistended, soft and nontender. Normal bowel sounds heard. Central nervous system: Alert and oriented. Moves all extremities  Psychiatry: Judgement and insight appear normal. Agitated mood and affect    Data Reviewed: I have personally reviewed following labs and imaging studies  CBC: Recent Labs  Lab 04/26/24 0710  WBC 8.5  HGB 13.3  HCT 39.5  MCV 85.1  PLT 244   Basic Metabolic Panel: Recent Labs  Lab 04/26/24 0710  NA 137  K 4.2  CL 104  CO2 23  GLUCOSE 140*  BUN 23  CREATININE 1.13  CALCIUM 8.9   GFR: Estimated Creatinine Clearance: 49 mL/min (by C-G formula based on SCr of 1.13 mg/dL). Liver Function Tests: No results for input(s): AST, ALT, ALKPHOS, BILITOT, PROT, ALBUMIN in the last 168 hours. No results for input(s): LIPASE, AMYLASE in the last 168 hours. No results for input(s): AMMONIA in the last 168 hours. Coagulation Profile: No results for input(s): INR, PROTIME in the last 168 hours. Cardiac Enzymes: No results for input(s):  CKTOTAL, CKMB, CKMBINDEX, TROPONINI in the last 168 hours. BNP (last 3 results) No results for input(s): PROBNP in the last 8760 hours. HbA1C: No results for input(s): HGBA1C in the last 72 hours. CBG: Recent Labs  Lab 04/26/24 0856 04/26/24 1229  GLUCAP 199* 136*   Lipid Profile: No results for input(s): CHOL, HDL, LDLCALC, TRIG, CHOLHDL, LDLDIRECT in the last 72 hours. Thyroid  Function Tests: No results for input(s): TSH, T4TOTAL, FREET4, T3FREE, THYROIDAB  in the last 72 hours. Anemia Panel: No results for input(s): VITAMINB12, FOLATE, FERRITIN, TIBC, IRON, RETICCTPCT in the last 72 hours. Sepsis Labs: No results for input(s): PROCALCITON, LATICACIDVEN in the last 168 hours.  No results found for this or any previous visit (from the past 240 hours).       Radiology Studies: No results found.      Scheduled Meds:  amiodarone   200 mg Oral Daily   carbidopa -levodopa   1 tablet Oral TID   vitamin B-12  1,000 mcg Oral Daily   diclofenac  Sodium  4 g Topical QID   divalproex   750 mg Oral Daily   enoxaparin  (LOVENOX ) injection  40 mg Subcutaneous Q24H   glipiZIDE   5 mg Oral Q breakfast   QUEtiapine   50 mg Oral TID   traZODone   25 mg Oral QHS   Continuous Infusions:   LOS: 0 days     Anthony CHRISTELLA Pouch, MD [facility] Pager [phone]  If 7PM-7AM, please contact night-coverage [email or web address] 04/30/2024, 10:05 AM

## 2024-04-30 NOTE — Plan of Care (Signed)
  Problem: Education: Goal: Knowledge of General Education information will improve Description: Including pain rating scale, medication(s)/side effects and non-pharmacologic comfort measures Outcome: Not Progressing   Problem: Health Behavior/Discharge Planning: Goal: Ability to manage health-related needs will improve Outcome: Not Progressing   Problem: Clinical Measurements: Goal: Ability to maintain clinical measurements within normal limits will improve Outcome: Progressing Goal: Respiratory complications will improve Outcome: Progressing Goal: Cardiovascular complication will be avoided Outcome: Progressing

## 2024-04-30 NOTE — Progress Notes (Signed)
 No observation sitter available. Frequent checks per charge RN.

## 2024-04-30 NOTE — Progress Notes (Signed)
 Patient continues to climb out of bed. Alarm on, mats in place. Patient put back in bed by RN. Patient unsteady on feet. Charge RN made aware. Sitter requested.

## 2024-04-30 NOTE — Progress Notes (Signed)
 Mobility Specialist - Progress Note    04/30/24 1606  Mobility  Activity Ambulated with assistance  Level of Assistance Minimal assist, patient does 75% or more  Assistive Device Front wheel walker  Distance Ambulated (ft) 160 ft  Activity Response Tolerated well  Mobility visit 1 Mobility  Mobility Specialist Start Time (ACUTE ONLY) 1544  Mobility Specialist Stop Time (ACUTE ONLY) 1603  Mobility Specialist Time Calculation (min) (ACUTE ONLY) 19 min   Pt side lying upon entry, utilizing RA. Pt motivated and agreeable to OOB amb this date. Pt completed bed mob HHA to bring trunk from sup to sit. Pt dangled EOB for ~ 2 mins requiring VC for redirection to task. Pt STS to RW ModA, amb one lap around the NS MinA-- ModA for balance during abrupt stops. Pt returned to the room, left supine with alarm set and needs within reach.  America Silvan Mobility Specialist 04/30/24 4:41 PM

## 2024-04-30 NOTE — Progress Notes (Signed)
 Patient increasingly agitated. Patient climbed out of bed to retrieve clothes from cabinet. RN assisted patient back to bed. Patient confused,screaming at RN and trying to hit and kick at RN, refused to put away clothing. Floor mats in place, bed alarm on. RN attempted to reorient patient, education provided.

## 2024-05-01 DIAGNOSIS — G9340 Encephalopathy, unspecified: Secondary | ICD-10-CM

## 2024-05-01 NOTE — Progress Notes (Signed)
 PROGRESS NOTE    Rick [name]  FMW:[id number] DOB: 09-02-1940 DOA: 03/20/2024 PCP: Sampson Ethridge LABOR, MD  Assessment & Plan:   Principal Problem:   Acute metabolic encephalopathy Active Problems:   Type 2 diabetes mellitus with peripheral neuropathy (HCC)   Acute lower UTI   Parkinsonism (HCC)   Paroxysmal atrial fibrillation (HCC)   B12 deficiency anemia  Assessment and Plan: Acute encephalopathy: likely delirium superimposed on dementia. Mental status is back to baseline. Continue on seroquel . Medically cleared and does not require inpatient hospitalization however he is under a DSS protective order and therefore cannot leave the hospital until safe dispo secured. Competency to be determined by the courts. TB skin test placed 8/9, read by MD 8/12.  No induration. No longer under IVC but protective order remains in place  DM2: fair control. Continue on home dose of glipizide . Glipizide  high risk for causing hypoglycemia    Parkinsonism: continue on home dose of sinemet     PAF: not on chronic anticoagulation likely secondary to high fall risk. Continue on home dose of amio    Hard of hearing: continue w/ supportive care   Possible hemorrhoids: continue on colace      DVT prophylaxis: lovenox   Code Status: full  Family Communication:  Disposition Plan: waiting on insurance auth to [employer] as per CM  Level of care: Telemetry Medical  Status is: Observation The patient remains OBS appropriate and will d/c before 2 midnights. Waiting insurance auth still as per CM     Consultants:    Procedures:   Antimicrobials:    Subjective: Pt c/o fatigue   Objective: Vitals:   04/30/24 0807 04/30/24 1514 04/30/24 1951 05/01/24 0830  BP: 120/80 131/81 (!) 107/59 102/68  Pulse: 90 93 81 77  Resp: 17 19 18 20   Temp: 98.2 F (36.8 C) 97.8 F (36.6 C) 98.6 F (37 C) 98.4 F (36.9 C)  TempSrc:  Oral  Oral  SpO2: 94% 94% 97% 97%  Weight:       Height:        Intake/Output Summary (Last 24 hours) at 05/01/2024 1107 Last data filed at 05/01/2024 0925 Gross per 24 hour  Intake 720 ml  Output 750 ml  Net -30 ml   Filed Weights   03/20/24 1914  Weight: 79.4 kg    Examination:  General exam: Appears calm & comfortable  Respiratory system: clear breath sounds b/l  Cardiovascular system: S1/S2+. No rubs or clicks  Gastrointestinal system: abd is soft, NT, ND & hypoactive bowel sounds Central nervous system: alert & awake. Moves all extremities  Psychiatry: Judgement and insight appears at baseline. Flat mood and affect    Data Reviewed: I have personally reviewed following labs and imaging studies  CBC: Recent Labs  Lab 04/26/24 0710  WBC 8.5  HGB 13.3  HCT 39.5  MCV 85.1  PLT 244   Basic Metabolic Panel: Recent Labs  Lab 04/26/24 0710  NA 137  K 4.2  CL 104  CO2 23  GLUCOSE 140*  BUN 23  CREATININE 1.13  CALCIUM 8.9   GFR: Estimated Creatinine Clearance: 49 mL/min (by C-G formula based on SCr of 1.13 mg/dL). Liver Function Tests: No results for input(s): AST, ALT, ALKPHOS, BILITOT, PROT, ALBUMIN in the last 168 hours. No results for input(s): LIPASE, AMYLASE in the last 168 hours. No results for input(s): AMMONIA in the last 168 hours. Coagulation Profile: No results for input(s): INR, PROTIME in the last 168 hours. Cardiac Enzymes:  No results for input(s): CKTOTAL, CKMB, CKMBINDEX, TROPONINI in the last 168 hours. BNP (last 3 results) No results for input(s): PROBNP in the last 8760 hours. HbA1C: No results for input(s): HGBA1C in the last 72 hours. CBG: Recent Labs  Lab 04/26/24 0856 04/26/24 1229  GLUCAP 199* 136*   Lipid Profile: No results for input(s): CHOL, HDL, LDLCALC, TRIG, CHOLHDL, LDLDIRECT in the last 72 hours. Thyroid  Function Tests: No results for input(s): TSH, T4TOTAL, FREET4, T3FREE, THYROIDAB in the last 72  hours. Anemia Panel: No results for input(s): VITAMINB12, FOLATE, FERRITIN, TIBC, IRON, RETICCTPCT in the last 72 hours. Sepsis Labs: No results for input(s): PROCALCITON, LATICACIDVEN in the last 168 hours.  No results found for this or any previous visit (from the past 240 hours).       Radiology Studies: No results found.      Scheduled Meds:  amiodarone   200 mg Oral Daily   carbidopa -levodopa   1 tablet Oral TID   vitamin B-12  1,000 mcg Oral Daily   diclofenac  Sodium  4 g Topical QID   divalproex   750 mg Oral Daily   enoxaparin  (LOVENOX ) injection  40 mg Subcutaneous Q24H   glipiZIDE   5 mg Oral Q breakfast   QUEtiapine   50 mg Oral TID   traZODone   25 mg Oral QHS   Continuous Infusions:   LOS: 0 days     Anthony CHRISTELLA Pouch, MD [facility] Pager [phone]  If 7PM-7AM, please contact night-coverage [email or web address] 05/01/2024, 11:07 AM

## 2024-05-01 NOTE — Progress Notes (Signed)
 Mobility Specialist - Progress Note   05/01/24 1013  Mobility  Activity Ambulated with assistance  Level of Assistance Minimal assist, patient does 75% or more  Assistive Device Front wheel walker  Distance Ambulated (ft) 160 ft  Activity Response Tolerated well  Mobility visit 1 Mobility  Mobility Specialist Start Time (ACUTE ONLY) [id number]  Mobility Specialist Stop Time (ACUTE ONLY) 1007  Mobility Specialist Time Calculation (min) (ACUTE ONLY) 19 min   Pt supine upon entry, utilizing RA. Pt motivated and agreeable to OOB amb this date. Pt completed bed mob HHA, amb one lap around the NS MinA--- VC's for redirection to task and gait speed control. Pt returned to the room, amb to/from the [facility]-- unsuccessful BM. Pt returned to bed, left supine with alarm set and needs within reach. Sitter present at bedside.  America Silvan Mobility Specialist 05/01/24 10:52 AM
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
## 2024-05-02 DIAGNOSIS — G9340 Encephalopathy, unspecified: Secondary | ICD-10-CM | POA: Diagnosis not present

## 2024-05-02 MED ORDER — DIVALPROEX SODIUM ER 250 MG PO TB24: 750.0000 mg | ORAL_TABLET | Freq: Every day | ORAL | 0 refills | Status: AC

## 2024-05-02 MED ORDER — GLIPIZIDE ER 5 MG PO TB24: 5.0000 mg | ORAL_TABLET | Freq: Every day | ORAL | 0 refills | Status: AC

## 2024-05-02 MED ORDER — QUETIAPINE FUMARATE 50 MG PO TABS: 50.0000 mg | ORAL_TABLET | Freq: Three times a day (TID) | ORAL | 0 refills | Status: AC

## 2024-05-02 NOTE — TOC Progression Note (Addendum)
 Transition of Care [facility]) - Progression Note    Patient Details  Name: Rick [name] MRN: [id number] Date of Birth: December 09, 1940  [facility] CM/SW Contact  [name], RN Phone Number: 05/02/2024, 4:11 PM  Clinical Narrative:     [facility] is no longer able to offer a bed.   They have confirmed that their sister facility [facility] in [place] can offer a bed  Per email chain 9/17 , Jannis dss supervisor agreement with placement at [facility]   Shara has been switched to [facility].  They can admit tomorrow, and facility will arrange transport  Patient will admit to [facility] and Rehab tomorrow  -Facility has arranged transport. Transport is to arrive at 9 am I have asked them to call the unit when they arrive [facility] (transport) [phone]   -Report can be called to Ellouise (weekend supervisor) [phone] If unable to reach Ellouise you can call chris at [phone] I have updated DSS by email   Jannis and Anyia with DSS updated via email Facility provided with Aniya's point of contact                   Expected Discharge Plan and Services         Expected Discharge Date: 05/02/24                                     Social Drivers of Health (SDOH) Interventions SDOH Screenings   Food Insecurity: Patient Declined (03/21/2024)  Housing: Patient Declined (03/21/2024)  Transportation Needs: Patient Declined (03/21/2024)  Utilities: Patient Declined (03/21/2024)  Depression (PHQ2-9): Low Risk  (10/26/2021)  Financial Resource Strain: High Risk (07/17/2022)  Social Connections: Patient Declined (03/21/2024)  Tobacco Use: Medium Risk (03/20/2024)    Readmission Risk Interventions     No data to display
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
## 2024-05-02 NOTE — Plan of Care (Signed)

## 2024-05-03 NOTE — TOC Transition Note (Signed)
 Transition of Care [facility]) - Discharge Note   Patient Details  Name: Rick [name] MRN: [id number] Date of Birth: 10-Sep-1940  [facility] CM/SW Contact:  Victory Jackquline RAMAN, RN Phone Number: 05/03/2024, 9:21 AM   Clinical Narrative:   Patient has discharge orders for tody. Pt discharging to [facility], Rm# [id number]. Nurse to call report to [phone].  Transport set up by facility. There is one person ahead of her. No further concerns. RNCM Signing off.    Final next level of care: Skilled Nursing Facility Barriers to Discharge: Barriers Resolved   Patient Goals and CMS Choice     Choice offered to / list presented to : NA (DSS Arranged SNF Placement)      Discharge Placement                Patient to be transferred to facility by: Facility Provided Transport   Patient and family notified of of transfer: 05/03/24  Discharge Plan and Services Additional resources added to the After Visit Summary for                                       Social Drivers of Health (SDOH) Interventions SDOH Screenings   Food Insecurity: Patient Declined (03/21/2024)  Housing: Patient Declined (03/21/2024)  Transportation Needs: Patient Declined (03/21/2024)  Utilities: Patient Declined (03/21/2024)  Depression (PHQ2-9): Low Risk  (10/26/2021)  Financial Resource Strain: High Risk (07/17/2022)  Social Connections: Patient Declined (03/21/2024)  Tobacco Use: Medium Risk (03/20/2024)     Readmission Risk Interventions     No data to display

## 2024-07-14 ENCOUNTER — Encounter: Admitting: Family Medicine

## 2024-07-29 ENCOUNTER — Encounter: Admitting: Family Medicine
